# Patient Record
Sex: Female | Born: 1940
Health system: Southern US, Community
[De-identification: ages and names within clinical notes are randomized; demographics above are authoritative.]

## PROBLEM LIST (undated history)

## (undated) DIAGNOSIS — R112 Nausea with vomiting, unspecified: Secondary | ICD-10-CM

## (undated) DIAGNOSIS — M1990 Unspecified osteoarthritis, unspecified site: Secondary | ICD-10-CM

## (undated) DIAGNOSIS — J45901 Unspecified asthma with (acute) exacerbation: Secondary | ICD-10-CM

## (undated) DIAGNOSIS — M6740 Ganglion, unspecified site: Secondary | ICD-10-CM

## (undated) DIAGNOSIS — Z9889 Other specified postprocedural states: Secondary | ICD-10-CM

## (undated) DIAGNOSIS — N390 Urinary tract infection, site not specified: Secondary | ICD-10-CM

## (undated) DIAGNOSIS — M949 Disorder of cartilage, unspecified: Secondary | ICD-10-CM

## (undated) DIAGNOSIS — M899 Disorder of bone, unspecified: Secondary | ICD-10-CM

## (undated) DIAGNOSIS — I10 Essential (primary) hypertension: Secondary | ICD-10-CM

## (undated) DIAGNOSIS — K219 Gastro-esophageal reflux disease without esophagitis: Secondary | ICD-10-CM

## (undated) DIAGNOSIS — F419 Anxiety disorder, unspecified: Secondary | ICD-10-CM

## (undated) DIAGNOSIS — F329 Major depressive disorder, single episode, unspecified: Secondary | ICD-10-CM

## (undated) DIAGNOSIS — F32A Depression, unspecified: Secondary | ICD-10-CM

## (undated) DIAGNOSIS — J309 Allergic rhinitis, unspecified: Secondary | ICD-10-CM

## (undated) DIAGNOSIS — S8263XA Displaced fracture of lateral malleolus of unspecified fibula, initial encounter for closed fracture: Secondary | ICD-10-CM

## (undated) DIAGNOSIS — G4700 Insomnia, unspecified: Secondary | ICD-10-CM

## (undated) DIAGNOSIS — E039 Hypothyroidism, unspecified: Secondary | ICD-10-CM

## (undated) DIAGNOSIS — R0989 Other specified symptoms and signs involving the circulatory and respiratory systems: Secondary | ICD-10-CM

## (undated) DIAGNOSIS — S92309A Fracture of unspecified metatarsal bone(s), unspecified foot, initial encounter for closed fracture: Secondary | ICD-10-CM

## (undated) HISTORY — DX: Displaced fracture of lateral malleolus of unspecified fibula, initial encounter for closed fracture: S82.63XA

## (undated) HISTORY — PX: EYE SURGERY: SHX253

## (undated) HISTORY — DX: Disorder of cartilage, unspecified: M94.9

## (undated) HISTORY — PX: TUBAL LIGATION: SHX77

## (undated) HISTORY — PX: TOTAL HIP ARTHROPLASTY: SHX124

## (undated) HISTORY — DX: Unspecified asthma with (acute) exacerbation: J45.901

## (undated) HISTORY — DX: Unspecified osteoarthritis, unspecified site: M19.90

## (undated) HISTORY — DX: Insomnia, unspecified: G47.00

## (undated) HISTORY — DX: Ganglion, unspecified site: M67.40

## (undated) HISTORY — PX: JOINT REPLACEMENT: SHX530

## (undated) HISTORY — DX: Essential (primary) hypertension: I10

## (undated) HISTORY — DX: Other specified symptoms and signs involving the circulatory and respiratory systems: R09.89

## (undated) HISTORY — DX: Urinary tract infection, site not specified: N39.0

## (undated) HISTORY — DX: Fracture of unspecified metatarsal bone(s), unspecified foot, initial encounter for closed fracture: S92.309A

## (undated) HISTORY — DX: Gastro-esophageal reflux disease without esophagitis: K21.9

## (undated) HISTORY — DX: Disorder of bone, unspecified: M89.9

## (undated) HISTORY — DX: Allergic rhinitis, unspecified: J30.9

---

## 2000-07-09 ENCOUNTER — Other Ambulatory Visit: Admission: RE | Admit: 2000-07-09 | Discharge: 2000-07-09 | Payer: Self-pay | Admitting: Neurosurgery

## 2001-01-14 ENCOUNTER — Ambulatory Visit (HOSPITAL_COMMUNITY): Admission: RE | Admit: 2001-01-14 | Discharge: 2001-01-14 | Payer: Self-pay | Admitting: Internal Medicine

## 2001-01-14 ENCOUNTER — Encounter: Payer: Self-pay | Admitting: Internal Medicine

## 2001-01-19 ENCOUNTER — Encounter: Payer: Self-pay | Admitting: Internal Medicine

## 2001-01-19 ENCOUNTER — Ambulatory Visit (HOSPITAL_COMMUNITY): Admission: RE | Admit: 2001-01-19 | Discharge: 2001-01-19 | Payer: Self-pay | Admitting: Internal Medicine

## 2001-06-09 ENCOUNTER — Ambulatory Visit (HOSPITAL_COMMUNITY): Admission: RE | Admit: 2001-06-09 | Discharge: 2001-06-09 | Payer: Self-pay | Admitting: Internal Medicine

## 2001-06-09 ENCOUNTER — Encounter: Payer: Self-pay | Admitting: Internal Medicine

## 2002-01-05 ENCOUNTER — Other Ambulatory Visit: Admission: RE | Admit: 2002-01-05 | Discharge: 2002-01-05 | Payer: Self-pay | Admitting: Internal Medicine

## 2002-01-11 ENCOUNTER — Ambulatory Visit (HOSPITAL_COMMUNITY): Admission: RE | Admit: 2002-01-11 | Discharge: 2002-01-11 | Payer: Self-pay | Admitting: Internal Medicine

## 2002-01-11 ENCOUNTER — Encounter: Payer: Self-pay | Admitting: Internal Medicine

## 2002-04-27 ENCOUNTER — Encounter: Payer: Self-pay | Admitting: Internal Medicine

## 2002-04-27 ENCOUNTER — Encounter: Admission: RE | Admit: 2002-04-27 | Discharge: 2002-04-27 | Payer: Self-pay | Admitting: Internal Medicine

## 2002-06-28 ENCOUNTER — Other Ambulatory Visit: Admission: RE | Admit: 2002-06-28 | Discharge: 2002-06-28 | Payer: Self-pay | Admitting: Internal Medicine

## 2002-06-30 ENCOUNTER — Ambulatory Visit (HOSPITAL_COMMUNITY): Admission: RE | Admit: 2002-06-30 | Discharge: 2002-06-30 | Payer: Self-pay | Admitting: Internal Medicine

## 2002-06-30 ENCOUNTER — Encounter: Payer: Self-pay | Admitting: Internal Medicine

## 2002-08-25 ENCOUNTER — Encounter: Payer: Self-pay | Admitting: Orthopedic Surgery

## 2002-08-29 ENCOUNTER — Ambulatory Visit (HOSPITAL_COMMUNITY): Admission: RE | Admit: 2002-08-29 | Discharge: 2002-08-29 | Payer: Self-pay | Admitting: General Surgery

## 2002-08-29 ENCOUNTER — Encounter: Payer: Self-pay | Admitting: General Surgery

## 2002-08-31 ENCOUNTER — Encounter: Payer: Self-pay | Admitting: Orthopedic Surgery

## 2002-08-31 ENCOUNTER — Inpatient Hospital Stay (HOSPITAL_COMMUNITY): Admission: RE | Admit: 2002-08-31 | Discharge: 2002-09-03 | Payer: Self-pay | Admitting: Orthopedic Surgery

## 2002-09-01 ENCOUNTER — Encounter: Payer: Self-pay | Admitting: Orthopedic Surgery

## 2003-03-22 ENCOUNTER — Ambulatory Visit (HOSPITAL_BASED_OUTPATIENT_CLINIC_OR_DEPARTMENT_OTHER): Admission: RE | Admit: 2003-03-22 | Discharge: 2003-03-22 | Payer: Self-pay | Admitting: Orthopedic Surgery

## 2003-03-22 ENCOUNTER — Encounter (INDEPENDENT_AMBULATORY_CARE_PROVIDER_SITE_OTHER): Payer: Self-pay | Admitting: Specialist

## 2003-09-25 ENCOUNTER — Ambulatory Visit (HOSPITAL_COMMUNITY): Admission: RE | Admit: 2003-09-25 | Discharge: 2003-09-25 | Payer: Self-pay | Admitting: Internal Medicine

## 2003-12-11 ENCOUNTER — Other Ambulatory Visit: Admission: RE | Admit: 2003-12-11 | Discharge: 2003-12-11 | Payer: Self-pay | Admitting: *Deleted

## 2004-05-15 ENCOUNTER — Ambulatory Visit (HOSPITAL_BASED_OUTPATIENT_CLINIC_OR_DEPARTMENT_OTHER): Admission: RE | Admit: 2004-05-15 | Discharge: 2004-05-15 | Payer: Self-pay | Admitting: Orthopedic Surgery

## 2004-05-15 ENCOUNTER — Ambulatory Visit (HOSPITAL_COMMUNITY): Admission: RE | Admit: 2004-05-15 | Discharge: 2004-05-15 | Payer: Self-pay | Admitting: Orthopedic Surgery

## 2004-05-22 ENCOUNTER — Ambulatory Visit (HOSPITAL_BASED_OUTPATIENT_CLINIC_OR_DEPARTMENT_OTHER): Admission: RE | Admit: 2004-05-22 | Discharge: 2004-05-22 | Payer: Self-pay | Admitting: Orthopedic Surgery

## 2004-06-19 ENCOUNTER — Ambulatory Visit (HOSPITAL_COMMUNITY): Admission: RE | Admit: 2004-06-19 | Discharge: 2004-06-19 | Payer: Self-pay | Admitting: Sports Medicine

## 2004-09-26 ENCOUNTER — Ambulatory Visit: Payer: Self-pay | Admitting: Internal Medicine

## 2004-11-12 ENCOUNTER — Ambulatory Visit: Payer: Self-pay | Admitting: Internal Medicine

## 2004-12-06 ENCOUNTER — Ambulatory Visit: Payer: Self-pay | Admitting: Internal Medicine

## 2004-12-11 ENCOUNTER — Ambulatory Visit (HOSPITAL_COMMUNITY): Admission: RE | Admit: 2004-12-11 | Discharge: 2004-12-11 | Payer: Self-pay | Admitting: Internal Medicine

## 2004-12-23 ENCOUNTER — Ambulatory Visit: Payer: Self-pay | Admitting: Internal Medicine

## 2004-12-23 ENCOUNTER — Ambulatory Visit (HOSPITAL_COMMUNITY): Admission: RE | Admit: 2004-12-23 | Discharge: 2004-12-23 | Payer: Self-pay | Admitting: Internal Medicine

## 2004-12-26 ENCOUNTER — Encounter: Admission: RE | Admit: 2004-12-26 | Discharge: 2004-12-26 | Payer: Self-pay | Admitting: Internal Medicine

## 2004-12-26 ENCOUNTER — Other Ambulatory Visit: Admission: RE | Admit: 2004-12-26 | Discharge: 2004-12-26 | Payer: Self-pay | Admitting: Internal Medicine

## 2004-12-26 ENCOUNTER — Ambulatory Visit: Payer: Self-pay | Admitting: Internal Medicine

## 2005-03-11 ENCOUNTER — Ambulatory Visit: Payer: Self-pay | Admitting: Internal Medicine

## 2005-11-13 ENCOUNTER — Ambulatory Visit: Payer: Self-pay | Admitting: Internal Medicine

## 2005-12-22 ENCOUNTER — Ambulatory Visit: Payer: Self-pay | Admitting: Internal Medicine

## 2005-12-29 ENCOUNTER — Ambulatory Visit: Payer: Self-pay | Admitting: Internal Medicine

## 2005-12-29 ENCOUNTER — Encounter: Payer: Self-pay | Admitting: Internal Medicine

## 2005-12-29 ENCOUNTER — Other Ambulatory Visit: Admission: RE | Admit: 2005-12-29 | Discharge: 2005-12-29 | Payer: Self-pay | Admitting: Internal Medicine

## 2005-12-30 ENCOUNTER — Ambulatory Visit (HOSPITAL_COMMUNITY): Admission: RE | Admit: 2005-12-30 | Discharge: 2005-12-30 | Payer: Self-pay | Admitting: Internal Medicine

## 2006-01-05 ENCOUNTER — Encounter: Admission: RE | Admit: 2006-01-05 | Discharge: 2006-01-05 | Payer: Self-pay | Admitting: Internal Medicine

## 2006-01-20 ENCOUNTER — Ambulatory Visit: Payer: Self-pay | Admitting: *Deleted

## 2006-07-16 ENCOUNTER — Ambulatory Visit: Payer: Self-pay | Admitting: Internal Medicine

## 2006-08-14 ENCOUNTER — Ambulatory Visit: Payer: Self-pay | Admitting: Internal Medicine

## 2006-09-14 ENCOUNTER — Ambulatory Visit: Payer: Self-pay | Admitting: Internal Medicine

## 2006-10-13 ENCOUNTER — Ambulatory Visit: Payer: Self-pay | Admitting: Internal Medicine

## 2006-11-12 ENCOUNTER — Ambulatory Visit: Payer: Self-pay | Admitting: Internal Medicine

## 2006-12-18 ENCOUNTER — Ambulatory Visit: Payer: Self-pay | Admitting: Internal Medicine

## 2007-02-18 ENCOUNTER — Ambulatory Visit: Payer: Self-pay | Admitting: Internal Medicine

## 2007-02-18 LAB — CONVERTED CEMR LAB: CRP, High Sensitivity: 117 — ABNORMAL HIGH (ref 0.00–5.00)

## 2007-02-19 ENCOUNTER — Encounter: Payer: Self-pay | Admitting: Internal Medicine

## 2007-02-22 ENCOUNTER — Ambulatory Visit: Payer: Self-pay | Admitting: Internal Medicine

## 2007-02-22 LAB — CONVERTED CEMR LAB
BUN: 23 mg/dL (ref 6–23)
Calcium: 8.8 mg/dL (ref 8.4–10.5)
Creatinine, Ser: 1 mg/dL (ref 0.4–1.2)
GFR calc Af Amer: 71 mL/min
Glucose, Bld: 79 mg/dL (ref 70–99)
Sodium: 140 meq/L (ref 135–145)

## 2007-03-01 ENCOUNTER — Encounter: Payer: Self-pay | Admitting: Internal Medicine

## 2007-03-01 ENCOUNTER — Ambulatory Visit: Payer: Self-pay

## 2007-04-07 DIAGNOSIS — M1990 Unspecified osteoarthritis, unspecified site: Secondary | ICD-10-CM

## 2007-04-07 DIAGNOSIS — I10 Essential (primary) hypertension: Secondary | ICD-10-CM

## 2007-04-07 HISTORY — DX: Unspecified osteoarthritis, unspecified site: M19.90

## 2007-04-07 HISTORY — DX: Essential (primary) hypertension: I10

## 2007-04-19 ENCOUNTER — Ambulatory Visit: Payer: Self-pay | Admitting: Internal Medicine

## 2007-04-19 DIAGNOSIS — M6740 Ganglion, unspecified site: Secondary | ICD-10-CM | POA: Insufficient documentation

## 2007-04-19 DIAGNOSIS — E785 Hyperlipidemia, unspecified: Secondary | ICD-10-CM

## 2007-04-19 DIAGNOSIS — R0989 Other specified symptoms and signs involving the circulatory and respiratory systems: Secondary | ICD-10-CM | POA: Insufficient documentation

## 2007-04-19 HISTORY — DX: Other specified symptoms and signs involving the circulatory and respiratory systems: R09.89

## 2007-04-19 HISTORY — DX: Ganglion, unspecified site: M67.40

## 2007-04-22 ENCOUNTER — Encounter: Payer: Self-pay | Admitting: Internal Medicine

## 2007-04-22 ENCOUNTER — Ambulatory Visit: Payer: Self-pay

## 2007-07-14 ENCOUNTER — Ambulatory Visit: Payer: Self-pay | Admitting: Internal Medicine

## 2007-07-14 LAB — CONVERTED CEMR LAB
ALT: 33 units/L (ref 0–35)
Albumin: 3.6 g/dL (ref 3.5–5.2)
Alkaline Phosphatase: 80 units/L (ref 39–117)
BUN: 15 mg/dL (ref 6–23)
Basophils Absolute: 0 10*3/uL (ref 0.0–0.1)
Cholesterol: 193 mg/dL (ref 0–200)
Creatinine, Ser: 0.8 mg/dL (ref 0.4–1.2)
Eosinophils Absolute: 0.1 10*3/uL (ref 0.0–0.6)
Eosinophils Relative: 1.9 % (ref 0.0–5.0)
HDL: 69.9 mg/dL (ref >39.0)
Hemoglobin: 14.8 g/dL (ref 12.0–15.0)
Ketones, urine, test strip: NEGATIVE
Lymphocytes Relative: 42.1 % (ref 12.0–46.0)
MCHC: 34.7 g/dL (ref 30.0–36.0)
MCV: 88.8 fL (ref 78.0–100.0)
Monocytes Relative: 7.5 % (ref 3.0–11.0)
Neutro Abs: 3.6 10*3/uL (ref 1.4–7.7)
Platelets: 339 10*3/uL (ref 150–400)
Potassium: 3.8 meq/L (ref 3.5–5.1)
RBC: 4.8 M/uL (ref 3.87–5.11)
Specific Gravity, Urine: 1.015
Total Bilirubin: 0.6 mg/dL (ref 0.3–1.2)
Total Protein: 6.1 g/dL (ref 6.0–8.3)
WBC: 7.3 10*3/uL (ref 4.5–10.5)
pH: 7.5

## 2007-07-20 ENCOUNTER — Other Ambulatory Visit: Admission: RE | Admit: 2007-07-20 | Discharge: 2007-07-20 | Payer: Self-pay | Admitting: Internal Medicine

## 2007-07-20 ENCOUNTER — Ambulatory Visit: Payer: Self-pay | Admitting: Internal Medicine

## 2007-07-22 ENCOUNTER — Encounter: Payer: Self-pay | Admitting: Internal Medicine

## 2007-07-26 ENCOUNTER — Telehealth: Payer: Self-pay | Admitting: *Deleted

## 2007-08-17 ENCOUNTER — Telehealth: Payer: Self-pay | Admitting: Internal Medicine

## 2007-09-13 ENCOUNTER — Telehealth: Payer: Self-pay | Admitting: Internal Medicine

## 2007-09-15 LAB — CONVERTED CEMR LAB: Pap Smear: NORMAL

## 2007-10-26 ENCOUNTER — Ambulatory Visit: Payer: Self-pay | Admitting: Internal Medicine

## 2008-02-17 ENCOUNTER — Ambulatory Visit: Payer: Self-pay | Admitting: Internal Medicine

## 2008-02-17 DIAGNOSIS — J45901 Unspecified asthma with (acute) exacerbation: Secondary | ICD-10-CM

## 2008-02-17 HISTORY — DX: Unspecified asthma with (acute) exacerbation: J45.901

## 2008-03-07 ENCOUNTER — Ambulatory Visit: Payer: Self-pay | Admitting: Internal Medicine

## 2008-03-07 ENCOUNTER — Encounter: Payer: Self-pay | Admitting: Internal Medicine

## 2008-03-10 ENCOUNTER — Ambulatory Visit: Payer: Self-pay | Admitting: Internal Medicine

## 2008-03-10 DIAGNOSIS — M899 Disorder of bone, unspecified: Secondary | ICD-10-CM

## 2008-03-10 DIAGNOSIS — M949 Disorder of cartilage, unspecified: Secondary | ICD-10-CM

## 2008-03-10 HISTORY — DX: Disorder of bone, unspecified: M89.9

## 2008-03-10 LAB — CONVERTED CEMR LAB: Vit D, 1,25-Dihydroxy: 35 (ref 30–89)

## 2008-03-23 ENCOUNTER — Telehealth: Payer: Self-pay | Admitting: Internal Medicine

## 2008-06-09 ENCOUNTER — Ambulatory Visit: Payer: Self-pay | Admitting: Internal Medicine

## 2008-07-14 ENCOUNTER — Telehealth: Payer: Self-pay | Admitting: Internal Medicine

## 2008-08-21 ENCOUNTER — Ambulatory Visit: Payer: Self-pay | Admitting: Internal Medicine

## 2008-08-28 ENCOUNTER — Ambulatory Visit: Payer: Self-pay | Admitting: Internal Medicine

## 2008-08-30 ENCOUNTER — Inpatient Hospital Stay (HOSPITAL_COMMUNITY): Admission: EM | Admit: 2008-08-30 | Discharge: 2008-09-05 | Payer: Self-pay | Admitting: Emergency Medicine

## 2008-08-30 ENCOUNTER — Telehealth: Payer: Self-pay | Admitting: *Deleted

## 2008-09-20 ENCOUNTER — Ambulatory Visit: Payer: Self-pay | Admitting: Internal Medicine

## 2008-09-20 LAB — CONVERTED CEMR LAB
Basophils Absolute: 0 10*3/uL (ref 0.0–0.1)
CO2: 33 meq/L — ABNORMAL HIGH (ref 19–32)
Chloride: 98 meq/L (ref 96–112)
Creatinine, Ser: 1 mg/dL (ref 0.4–1.2)
Lymphocytes Relative: 40 % (ref 12.0–46.0)
MCHC: 34.6 g/dL (ref 30.0–36.0)
Monocytes Absolute: 0.8 10*3/uL (ref 0.1–1.0)
Neutro Abs: 4.6 10*3/uL (ref 1.4–7.7)
Platelets: 483 10*3/uL — ABNORMAL HIGH (ref 150–400)
Potassium: 4.1 meq/L (ref 3.5–5.1)
RBC: 4.89 M/uL (ref 3.87–5.11)
RDW: 12.7 % (ref 11.5–14.6)

## 2008-09-21 ENCOUNTER — Encounter: Payer: Self-pay | Admitting: Internal Medicine

## 2008-09-22 ENCOUNTER — Ambulatory Visit (HOSPITAL_COMMUNITY): Admission: RE | Admit: 2008-09-22 | Discharge: 2008-09-22 | Payer: Self-pay | Admitting: Internal Medicine

## 2008-09-22 ENCOUNTER — Telehealth: Payer: Self-pay | Admitting: Internal Medicine

## 2008-09-25 ENCOUNTER — Telehealth: Payer: Self-pay | Admitting: Internal Medicine

## 2008-09-29 ENCOUNTER — Telehealth: Payer: Self-pay | Admitting: Internal Medicine

## 2008-11-08 ENCOUNTER — Ambulatory Visit: Payer: Self-pay | Admitting: Internal Medicine

## 2008-11-08 DIAGNOSIS — K219 Gastro-esophageal reflux disease without esophagitis: Secondary | ICD-10-CM

## 2008-11-08 HISTORY — DX: Gastro-esophageal reflux disease without esophagitis: K21.9

## 2008-11-21 ENCOUNTER — Telehealth: Payer: Self-pay | Admitting: Internal Medicine

## 2008-11-30 ENCOUNTER — Ambulatory Visit: Payer: Self-pay | Admitting: Critical Care Medicine

## 2008-11-30 DIAGNOSIS — J309 Allergic rhinitis, unspecified: Secondary | ICD-10-CM

## 2008-11-30 HISTORY — DX: Allergic rhinitis, unspecified: J30.9

## 2008-12-13 ENCOUNTER — Ambulatory Visit: Payer: Self-pay | Admitting: Critical Care Medicine

## 2008-12-14 ENCOUNTER — Encounter: Payer: Self-pay | Admitting: Critical Care Medicine

## 2009-01-04 ENCOUNTER — Ambulatory Visit: Payer: Self-pay | Admitting: Critical Care Medicine

## 2009-03-14 ENCOUNTER — Encounter: Payer: Self-pay | Admitting: Internal Medicine

## 2009-03-14 ENCOUNTER — Ambulatory Visit: Payer: Self-pay | Admitting: Internal Medicine

## 2009-03-16 ENCOUNTER — Encounter: Payer: Self-pay | Admitting: Critical Care Medicine

## 2009-04-13 ENCOUNTER — Ambulatory Visit: Payer: Self-pay | Admitting: Critical Care Medicine

## 2009-05-22 ENCOUNTER — Encounter: Payer: Self-pay | Admitting: Orthopedic Surgery

## 2009-05-24 ENCOUNTER — Ambulatory Visit: Payer: Self-pay | Admitting: Internal Medicine

## 2009-05-24 LAB — CONVERTED CEMR LAB
AST: 28 units/L (ref 0–37)
Albumin: 3.6 g/dL (ref 3.5–5.2)
BUN: 22 mg/dL (ref 6–23)
Basophils Relative: 0.1 % (ref 0.0–3.0)
CO2: 31 meq/L (ref 19–32)
Chloride: 102 meq/L (ref 96–112)
Eosinophils Relative: 1.9 % (ref 0.0–5.0)
Glucose, Bld: 90 mg/dL (ref 70–99)
Hemoglobin: 14.4 g/dL (ref 12.0–15.0)
LDL Cholesterol: 100 mg/dL — ABNORMAL HIGH (ref 0–99)
Lymphocytes Relative: 37.4 % (ref 12.0–46.0)
MCV: 89 fL (ref 78.0–100.0)
Monocytes Relative: 8.1 % (ref 3.0–12.0)
Neutro Abs: 4.2 10*3/uL (ref 1.4–7.7)
Neutrophils Relative %: 52.5 % (ref 43.0–77.0)
Nitrite: NEGATIVE
Protein, U semiquant: NEGATIVE
Sodium: 140 meq/L (ref 135–145)
TSH: 0.21 microintl units/mL — ABNORMAL LOW (ref 0.35–5.50)
Total Bilirubin: 0.7 mg/dL (ref 0.3–1.2)
Total CHOL/HDL Ratio: 3

## 2009-05-29 ENCOUNTER — Emergency Department (HOSPITAL_COMMUNITY): Admission: EM | Admit: 2009-05-29 | Discharge: 2009-05-29 | Payer: Self-pay | Admitting: Emergency Medicine

## 2009-06-01 ENCOUNTER — Telehealth: Payer: Self-pay | Admitting: Internal Medicine

## 2009-06-04 ENCOUNTER — Ambulatory Visit: Payer: Self-pay | Admitting: Orthopedic Surgery

## 2009-06-04 DIAGNOSIS — S8263XA Displaced fracture of lateral malleolus of unspecified fibula, initial encounter for closed fracture: Secondary | ICD-10-CM

## 2009-06-04 DIAGNOSIS — S92309A Fracture of unspecified metatarsal bone(s), unspecified foot, initial encounter for closed fracture: Secondary | ICD-10-CM

## 2009-06-04 HISTORY — DX: Displaced fracture of lateral malleolus of unspecified fibula, initial encounter for closed fracture: S82.63XA

## 2009-06-04 HISTORY — DX: Fracture of unspecified metatarsal bone(s), unspecified foot, initial encounter for closed fracture: S92.309A

## 2009-06-07 ENCOUNTER — Ambulatory Visit: Payer: Self-pay | Admitting: Orthopedic Surgery

## 2009-06-11 ENCOUNTER — Ambulatory Visit: Payer: Self-pay | Admitting: Orthopedic Surgery

## 2009-06-18 ENCOUNTER — Telehealth: Payer: Self-pay | Admitting: Orthopedic Surgery

## 2009-06-19 ENCOUNTER — Ambulatory Visit: Payer: Self-pay | Admitting: Orthopedic Surgery

## 2009-07-04 ENCOUNTER — Telehealth: Payer: Self-pay | Admitting: Internal Medicine

## 2009-07-10 ENCOUNTER — Ambulatory Visit: Payer: Self-pay | Admitting: Orthopedic Surgery

## 2009-07-19 ENCOUNTER — Telehealth: Payer: Self-pay | Admitting: Internal Medicine

## 2009-07-20 ENCOUNTER — Ambulatory Visit: Payer: Self-pay | Admitting: Internal Medicine

## 2009-07-20 DIAGNOSIS — N390 Urinary tract infection, site not specified: Secondary | ICD-10-CM | POA: Insufficient documentation

## 2009-07-20 HISTORY — DX: Urinary tract infection, site not specified: N39.0

## 2009-07-21 ENCOUNTER — Encounter: Payer: Self-pay | Admitting: Internal Medicine

## 2009-07-24 ENCOUNTER — Telehealth: Payer: Self-pay | Admitting: Internal Medicine

## 2009-07-31 ENCOUNTER — Ambulatory Visit: Payer: Self-pay | Admitting: Orthopedic Surgery

## 2009-08-14 ENCOUNTER — Ambulatory Visit: Payer: Self-pay | Admitting: Internal Medicine

## 2009-08-15 ENCOUNTER — Encounter: Payer: Self-pay | Admitting: Internal Medicine

## 2009-08-17 ENCOUNTER — Telehealth: Payer: Self-pay | Admitting: Internal Medicine

## 2009-08-31 ENCOUNTER — Telehealth (INDEPENDENT_AMBULATORY_CARE_PROVIDER_SITE_OTHER): Payer: Self-pay | Admitting: *Deleted

## 2009-09-03 ENCOUNTER — Ambulatory Visit: Payer: Self-pay | Admitting: Critical Care Medicine

## 2009-09-04 DIAGNOSIS — J069 Acute upper respiratory infection, unspecified: Secondary | ICD-10-CM | POA: Insufficient documentation

## 2009-10-18 ENCOUNTER — Ambulatory Visit: Payer: Self-pay | Admitting: Critical Care Medicine

## 2009-11-08 ENCOUNTER — Ambulatory Visit (HOSPITAL_COMMUNITY): Admission: RE | Admit: 2009-11-08 | Discharge: 2009-11-08 | Payer: Self-pay | Admitting: Internal Medicine

## 2009-11-09 ENCOUNTER — Ambulatory Visit: Payer: Self-pay | Admitting: Internal Medicine

## 2009-11-28 ENCOUNTER — Ambulatory Visit: Payer: Self-pay | Admitting: Critical Care Medicine

## 2009-11-28 ENCOUNTER — Encounter: Payer: Self-pay | Admitting: Internal Medicine

## 2010-02-04 ENCOUNTER — Ambulatory Visit: Payer: Self-pay | Admitting: Internal Medicine

## 2010-02-07 ENCOUNTER — Telehealth: Payer: Self-pay | Admitting: Internal Medicine

## 2010-05-13 ENCOUNTER — Telehealth: Payer: Self-pay | Admitting: Internal Medicine

## 2010-08-07 ENCOUNTER — Ambulatory Visit: Payer: Self-pay | Admitting: Internal Medicine

## 2010-08-07 DIAGNOSIS — G4700 Insomnia, unspecified: Secondary | ICD-10-CM | POA: Insufficient documentation

## 2010-08-07 HISTORY — DX: Insomnia, unspecified: G47.00

## 2010-08-07 LAB — CONVERTED CEMR LAB
Glucose, Urine, Semiquant: NEGATIVE
Ketones, urine, test strip: NEGATIVE
Specific Gravity, Urine: 1.02
pH: 6.5

## 2010-08-22 ENCOUNTER — Ambulatory Visit: Payer: Self-pay | Admitting: Pulmonary Disease

## 2010-09-17 ENCOUNTER — Telehealth: Payer: Self-pay | Admitting: Internal Medicine

## 2010-10-06 ENCOUNTER — Encounter: Payer: Self-pay | Admitting: Internal Medicine

## 2010-10-14 ENCOUNTER — Telehealth (INDEPENDENT_AMBULATORY_CARE_PROVIDER_SITE_OTHER): Payer: Self-pay | Admitting: *Deleted

## 2010-10-15 ENCOUNTER — Encounter: Payer: Self-pay | Admitting: Internal Medicine

## 2010-10-15 NOTE — Assessment & Plan Note (Signed)
Summary: fu per pt/njr   Vital Signs:  Patient profile:   70 year old female Height:      64.5 inches Weight:      181 pounds BMI:     30.70 Temp:     98.2 degrees F oral Pulse rate:   80 / minute Resp:     14 per minute BP sitting:   130 / 82  (left arm)  Vitals Entered By: Willy Eddy, LPN (November 09, 2009 12:04 PM) CC: roa- h as rt torn rotator cuff- seeing dr Thurston Hole   Primary Care Provider:  Dr Darryll Capers  CC:  roa- h as rt torn rotator cuff- seeing dr Thurston Hole.  History of Present Illness: the pt has rotator cuff with no surgical option the pts blood [pressure is in good control the pt has loss of strength in the arm the asthma is in good control and the GERD has stopped with the weight loss  Preventive Screening-Counseling & Management  Alcohol-Tobacco     Smoking Status: quit     Packs/Day: 1.5     Year Started: 1950     Year Quit: 1970     Pack years: 30  Problems Prior to Update: 1)  Uri  (ICD-465.9) 2)  Uti  (ICD-599.0) 3)  Closed Fracture of Lateral Malleolus  (ICD-824.2) 4)  Closed Fracture of Metatarsal Bone  (ICD-825.25) 5)  Allergic Rhinitis  (ICD-477.9) 6)  Gerd  (ICD-530.81) 7)  Esophageal Reflux  (ICD-530.81) 8)  Cough  (ICD-786.2) 9)  Osteopenia  (ICD-733.90) 10)  Asthma Unspecified With Exacerbation  (ICD-493.92) 11)  Preventive Health Care  (ICD-V70.0) 12)  Ganglion Cyst  (ICD-727.43) 13)  Carotid Bruit, Left  (ICD-785.9) 14)  Hyperlipidemia Nec/nos  (ICD-272.4) 15)  Osteoarthritis  (ICD-715.90) 16)  Hypertension  (ICD-401.9)  Current Problems (verified): 1)  Uri  (ICD-465.9) 2)  Uti  (ICD-599.0) 3)  Closed Fracture of Lateral Malleolus  (ICD-824.2) 4)  Closed Fracture of Metatarsal Bone  (ICD-825.25) 5)  Allergic Rhinitis  (ICD-477.9) 6)  Gerd  (ICD-530.81) 7)  Esophageal Reflux  (ICD-530.81) 8)  Cough  (ICD-786.2) 9)  Osteopenia  (ICD-733.90) 10)  Asthma Unspecified With Exacerbation  (ICD-493.92) 11)  Preventive Health  Care  (ICD-V70.0) 12)  Ganglion Cyst  (ICD-727.43) 13)  Carotid Bruit, Left  (ICD-785.9) 14)  Hyperlipidemia Nec/nos  (ICD-272.4) 15)  Osteoarthritis  (ICD-715.90) 16)  Hypertension  (ICD-401.9)  Medications Prior to Update: 1)  Wellbutrin Xl 300 Mg Tb24 (Bupropion Hcl) .... Take One Tablet By Mouth Daily 2)  Multivitamins   Tabs (Multiple Vitamin) .... Take One  Tablet By Mouth Daily 3)  Diovan Hct 320-25 Mg Tabs (Valsartan-Hydrochlorothiazide) .... One By Mouth Daily 4)  Prozac 20 Mg  Caps (Fluoxetine Hcl) .... One By Mouth Daiyl 5)  Adderall 20 Mg Tabs (Amphetamine-Dextroamphetamine) .... One By Mouth Bid 6)  Nasonex 50 Mcg/act  Susp (Mometasone Furoate) .... Two Puffs Each Nostril Daily As Needed 7)  Aleve 220 Mg Tabs (Naproxen Sodium) .... As Needed\par 8)  Excederin For [employer] ?dose .... As Needed 9)  Tussionex Pennkinetic Er 8-10 Mg/27ml Lqcr (Chlorpheniramine-Hydrocodone) .Marland Kitchen.. 1 Tsp Every 12 Hr As Needed Cough 10)  Symbicort 80-4.5 Mcg/act Aero (Budesonide-Formoterol Fumarate) .... 2 Puffs Two Times A Day 11)  Avelox 400 Mg Tabs (Moxifloxacin Hcl) .Marland Kitchen.. 1 By Mouth Once Daily 12)  Tussionex Pennkinetic Er 8-10 Mg/18ml Lqcr (Chlorpheniramine-Hydrocodone) .Marland Kitchen.. 1 Tsp Every 12 Hrs As Needed Cough  Current Medications (verified): 1)  Wellbutrin Xl  300 Mg Tb24 (Bupropion Hcl) .... Take One Tablet By Mouth Daily 2)  Multivitamins   Tabs (Multiple Vitamin) .... Take One  Tablet By Mouth Daily 3)  Diovan Hct 320-25 Mg Tabs (Valsartan-Hydrochlorothiazide) .... One By Mouth Daily 4)  Prozac 20 Mg  Caps (Fluoxetine Hcl) .... One By Mouth Daiyl 5)  Adderall 20 Mg Tabs (Amphetamine-Dextroamphetamine) .... One By Mouth Bid 6)  Nasonex 50 Mcg/act  Susp (Mometasone Furoate) .... Two Puffs Each Nostril Daily As Needed 7)  Aleve 220 Mg Tabs (Naproxen Sodium) .... As Needed\par 8)  Excederin For [employer] ?dose .... As Needed  Allergies (verified): No Known Drug Allergies  Past  History:  Family History: Last updated: 11/30/2008 Family History High cholesterol mother with PAD and carotid blockage brother-small cell carcinoma father-emphysema, asthma grandmother-asthma grandfather-CA ? type  Social History: Last updated: 11/30/2008 Retired.  Formerly worked [employer].   Married with children. Former Smoker.  Quit smoking 1970.  Smoked 1 1/2ppd x 20 years.   Risk Factors: Smoking Status: quit (11/09/2009) Packs/Day: 1.5 (11/09/2009)  Past medical, surgical, family and social histories (including risk factors) reviewed, and no changes noted (except as noted below).  Past Medical History: Reviewed history from 11/30/2008 and no changes required. Hypertension Osteoarthritis DJD Current Problems:  ESOPHAGEAL REFLUX (ICD-530.81) COUGH (ICD-786.2) SEPTICEMIA DUE TO ESCHERICHIA COLI (ICD-038.42) OSTEOPENIA (ICD-733.90) ASTHMA UNSPECIFIED WITH EXACERBATION (ICD-493.92) FLU SYNDROME (ICD-487.1) PREVENTIVE HEALTH CARE (ICD-V70.0) GANGLION CYST (ICD-727.43) CAROTID BRUIT, LEFT (ICD-785.9) HYPERLIPIDEMIA NEC/NOS (ICD-272.4) OSTEOARTHRITIS (ICD-715.90) HYPERTENSION (ICD-401.9)    Past Surgical History: Reviewed history from 04/19/2007 and no changes required. Tubal ligation Total hip replacement left 2005 Knuckle replacement  Family History: Reviewed history from 11/30/2008 and no changes required. Family History High cholesterol mother with PAD and carotid blockage brother-small cell carcinoma father-emphysema, asthma grandmother-asthma grandfather-CA ? type  Social History: Reviewed history from 11/30/2008 and no changes required. Retired.  Formerly worked [employer].   Married with children. Former Smoker.  Quit smoking 1970.  Smoked 1 1/2ppd x 20 years.   Review of Systems  The patient denies anorexia, fever, weight loss, weight gain, vision loss, decreased hearing, hoarseness, chest pain, syncope, dyspnea on  exertion, peripheral edema, prolonged cough, headaches, hemoptysis, abdominal pain, melena, hematochezia, severe indigestion/heartburn, hematuria, incontinence, genital sores, muscle weakness, suspicious skin lesions, transient blindness, difficulty walking, depression, unusual weight change, abnormal bleeding, enlarged lymph nodes, angioedema, and breast masses.    Physical Exam  General:  alert and well-developed.   Head:  Normocephalic and atraumatic without obvious abnormalities. No apparent alopecia or balding. Eyes:  No corneal or conjunctival inflammation noted. EOMI. Perrla. Funduscopic exam benign, without hemorrhages, exudates or papilledema. Vision grossly normal. Ears:  R ear normal and L ear normal.   Nose:  no external deformity and no nasal discharge.   Mouth:  good dentition and pharynx pink and moist.   Neck:  supple and full ROM.   Lungs:  Normal respiratory effort, chest expands symmetrically. Lungs are clear to auscultation, no crackles or wheezes. Heart:  Normal rate and regular rhythm. S1 and S2 normal without gallop, murmur, click, rub or other extra sounds.   Impression & Recommendations:  Problem # 1:  URI (ICD-465.9) took the anitbiotic but did not use the symbicort she did not feel she needed the pt was treated in pulmonary and she responde The following medications were removed from the medication list:    Tussionex Pennkinetic Er 8-10 Mg/73ml Lqcr (Chlorpheniramine-hydrocodone) .Marland Kitchen... 1 tsp every 12 hr as  needed cough    Tussionex Pennkinetic Er 8-10 Mg/45ml Lqcr (Chlorpheniramine-hydrocodone) .Marland Kitchen... 1 tsp every 12 hrs as needed cough Her updated medication list for this problem includes:    Aleve 220 Mg Tabs (Naproxen sodium) .Marland Kitchen... As needed\  Instructed on symptomatic treatment. Call if symptoms persist or worsen.   Problem # 2:  ESOPHAGEAL REFLUX (ICD-530.81) resolved with the 14 lbs weight loss Labs Reviewed: Hgb: 14.4 (05/24/2009)   Hct: 42.6  (05/24/2009)  Problem # 3:  PREVENTIVE HEALTH CARE (ICD-V70.0) has lost 14 pounds  Problem # 4:  HYPERLIPIDEMIA NEC/NOS (ICD-272.4)  Labs Reviewed: SGOT: 28 (05/24/2009)   SGPT: 26 (05/24/2009)  Prior 10 Yr Risk Heart Disease: 9 % (11/08/2008)   HDL:54.70 (05/24/2009), 69.9 (07/14/2007)  LDL:100 (05/24/2009), 117 (07/14/2007)  Chol:169 (05/24/2009), 193 (07/14/2007)  Trig:73.0 (05/24/2009), 30 (07/14/2007)  Complete Medication List: 1)  Wellbutrin Xl 300 Mg Tb24 (Bupropion hcl) .... Take one tablet by mouth daily 2)  Multivitamins Tabs (Multiple vitamin) .... Take one  tablet by mouth daily 3)  Diovan Hct 320-25 Mg Tabs (Valsartan-hydrochlorothiazide) .... One by mouth daily 4)  Prozac 20 Mg Caps (Fluoxetine hcl) .... One by mouth daiyl 5)  Adderall 20 Mg Tabs (Amphetamine-dextroamphetamine) .... One by mouth bid 6)  Nasonex 50 Mcg/act Susp (Mometasone furoate) .... Two puffs each nostril daily as needed 7)  Aleve 220 Mg Tabs (Naproxen sodium) .... As needed\par 8)  Excederin For [employer] ?dose  .... As needed  Patient Instructions: 1)  Please schedule a follow-up appointment in 3 months.  pap Prescriptions: ADDERALL 20 MG TABS (AMPHETAMINE-DEXTROAMPHETAMINE) one by mouth BID  #60 x 0   Entered by:   Willy Eddy, LPN   Authorized by:   Stacie Glaze MD   Signed by:   Willy Eddy, LPN on 81/19/1478   Method used:   Print then Give to Patient   RxID:   [id number] ADDERALL 20 MG TABS (AMPHETAMINE-DEXTROAMPHETAMINE) one by mouth BID  #60 x 0   Entered by:   Willy Eddy, LPN   Authorized by:   Stacie Glaze MD   Signed by:   Willy Eddy, LPN on 62/95/2841   Method used:   Print then Give to Patient   RxID:   [id number] ADDERALL 20 MG TABS (AMPHETAMINE-DEXTROAMPHETAMINE) one by mouth BID  #60 x 0   Entered by:   Willy Eddy, LPN   Authorized by:   Stacie Glaze MD   Signed by:   Willy Eddy, LPN on 03/47/4259   Method used:    Print then Give to Patient   RxID:   [id number]

## 2010-10-15 NOTE — Assessment & Plan Note (Signed)
Summary: Pulmonary OV   Copy to:  ER Primary Provider/Referring Provider:  Dr Darryll Capers  CC:  Pt here for follow-up.  She says she feels like new..  History of Present Illness:  70 year old female  with cyclic cough, GERD doubt true asthma   November 28, 2009 12:04 PM  The pt notes the cough is now gone The pt saw np 10/18/09: rx was : Restart Symbicort 80/4.79mcg 2 pufs two times a day , brush, rinse and gargle after using ,then drink cup of water.  Avelox 400mg  once daily for 7 days Mucinex DM two times a day as needed cough /congestion  Increase fluids.  Tussionex 1 tsp every 12 hr as needed cough  The pt did not fill tussionex, had some left over. The pt did not resume symbicort and got better without the symbicort The pt did fill and take avelox and mucinex  now:  no cough now, not dyspneic. She is back to baseline.  No wheeze     Preventive Screening-Counseling & Management  Alcohol-Tobacco     Smoking Status: never  Current Medications (verified): 1)  Wellbutrin Xl 300 Mg Tb24 (Bupropion Hcl) .... Take One Tablet By Mouth Daily 2)  Multivitamins   Tabs (Multiple Vitamin) .... Take One  Tablet By Mouth Daily 3)  Diovan Hct 320-25 Mg Tabs (Valsartan-Hydrochlorothiazide) .... One By Mouth Daily 4)  Prozac 20 Mg  Caps (Fluoxetine Hcl) .... One By Mouth Daiyl 5)  Adderall 20 Mg Tabs (Amphetamine-Dextroamphetamine) .... One By Mouth Bid 6)  Nasonex 50 Mcg/act  Susp (Mometasone Furoate) .... Two Puffs Each Nostril Daily As Needed 7)  Aleve 220 Mg Tabs (Naproxen Sodium) .... As Needed\par 8)  Excederin For [employer] ?dose .... As Needed  Allergies (verified): No Known Drug Allergies  Past History:  Past medical, surgical, family and social histories (including risk factors) reviewed, and no changes noted (except as noted below).  Past Medical History: Reviewed history from 11/30/2008 and no changes required. Hypertension Osteoarthritis DJD Current Problems:    ESOPHAGEAL REFLUX (ICD-530.81) COUGH (ICD-786.2) SEPTICEMIA DUE TO ESCHERICHIA COLI (ICD-038.42) OSTEOPENIA (ICD-733.90) ASTHMA UNSPECIFIED WITH EXACERBATION (ICD-493.92) FLU SYNDROME (ICD-487.1) PREVENTIVE HEALTH CARE (ICD-V70.0) GANGLION CYST (ICD-727.43) CAROTID BRUIT, LEFT (ICD-785.9) HYPERLIPIDEMIA NEC/NOS (ICD-272.4) OSTEOARTHRITIS (ICD-715.90) HYPERTENSION (ICD-401.9)    Past Surgical History: Reviewed history from 04/19/2007 and no changes required. Tubal ligation Total hip replacement left 2005 Knuckle replacement  Family History: Reviewed history from 11/30/2008 and no changes required. Family History High cholesterol mother with PAD and carotid blockage brother-small cell carcinoma father-emphysema, asthma grandmother-asthma grandfather-CA ? type  Social History: Reviewed history from 11/30/2008 and no changes required. Retired.  Formerly worked [employer].   Married with children. Former Smoker.  Quit smoking 1970.  Smoked 1 1/2ppd x 20 years.  Smoking Status:  never  Review of Systems  The patient denies shortness of breath with activity, shortness of breath at rest, productive cough, non-productive cough, coughing up blood, chest pain, irregular heartbeats, acid heartburn, indigestion, loss of appetite, weight change, abdominal pain, difficulty swallowing, sore throat, tooth/dental problems, headaches, nasal congestion/difficulty breathing through nose, sneezing, itching, ear ache, anxiety, depression, hand/feet swelling, joint stiffness or pain, rash, change in color of mucus, and fever.    Vital Signs:  Patient profile:   70 year old female Height:      64.5 inches (163.83 cm) Weight:      178 pounds (80.91 kg) BMI:     30.19 O2 Sat:  95 % on Room air Temp:     97.7 degrees F (36.50 degrees C) oral Pulse rate:   85 / minute BP sitting:   118 / 74  (left arm) Cuff size:   regular  Vitals Entered By: Michel Bickers CMA (November 28, 2009  12:03 PM)  O2 Sat at Rest %:  95 O2 Flow:  Room air  Physical Exam  Additional Exam:  Gen: Pleasant, well-nourished, in no distress,  normal affect ENT: No lesions,  mouth clear,  oropharynx clear drainage.  no postnasal drip Neck: No JVD, no TMG, no carotid bruits Lungs:clear, no wheeze  Cardiovascular: RRR, heart sounds normal, no murmur or gallops, no peripheral edema Abdomen: soft and NT, no HSM,  BS normal Musculoskeletal: No deformities, no cyanosis or clubbing Neuro: alert, non focal Skin: Warm, no lesions or rashes    Pulmonary Function Test Date: 11/28/2009 12:16 AM Height (in.): 64 Gender: Female  Pre-Spirometry FVC    Value: 2.98 L/min   % Pred: 99.30 % FEV1    Value: 2.32 L     Pred: 2.28 L     % Pred: 101.80 % FEV1/FVC  Value: 77.75 %     % Pred: 102.10 %  Impression & Recommendations:  Problem # 1:  COUGH (ICD-786.2) Assessment Improved cyclical cough now improved,  doubt true asthma,  normal spirometry today,  allergic rhinitis and GERD were ppt factors plan d/c symbicort Return as needed  Orders: Est. Patient Level III (04540)  Complete Medication List: 1)  Wellbutrin Xl 300 Mg Tb24 (Bupropion hcl) .... Take one tablet by mouth daily 2)  Multivitamins Tabs (Multiple vitamin) .... Take one  tablet by mouth daily 3)  Diovan Hct 320-25 Mg Tabs (Valsartan-hydrochlorothiazide) .... One by mouth daily 4)  Prozac 20 Mg Caps (Fluoxetine hcl) .... One by mouth daiyl 5)  Adderall 20 Mg Tabs (Amphetamine-dextroamphetamine) .... One by mouth bid 6)  Nasonex 50 Mcg/act Susp (Mometasone furoate) .... Two puffs each nostril daily as needed 7)  Aleve 220 Mg Tabs (Naproxen sodium) .... As needed\par 8)  Excederin For [employer] ?dose  .... As needed  Patient Instructions: 1)  Stay off symbicort 2)  Return as needed    CardioPerfect Spirometry  ID: [id number] Patient: [name], LIRA DOB: 06-Dec-1940 Age: 70 Years Old Sex: Female Race: White Physician: Stacie Glaze MD Height: 64.5 Weight: 178 PPD: 1.5 Status: Confirmed Past Medical History:  Hypertension Osteoarthritis DJD Current Problems:  ESOPHAGEAL REFLUX (ICD-530.81) COUGH (ICD-786.2) SEPTICEMIA DUE TO ESCHERICHIA COLI (ICD-038.42) OSTEOPENIA (ICD-733.90) ASTHMA UNSPECIFIED WITH EXACERBATION (ICD-493.92) FLU SYNDROME (ICD-487.1) PREVENTIVE HEALTH CARE (ICD-V70.0) GANGLION CYST (ICD-727.43) CAROTID BRUIT, LEFT (ICD-785.9) HYPERLIPIDEMIA NEC/NOS (ICD-272.4) OSTEOARTHRITIS (ICD-715.90) HYPERTENSION (ICD-401.9)   Recorded: 11/28/2009 12:16 AM  Parameter  Measured Predicted %Predicted FVC     2.98        3.00        99.30 FEV1     2.32        2.28        101.80 FEV1%   77.75        76.15        102.10 PEF    7.36        5.69        129.40   Comments: Normal spirometry   Interpretation: Pre: FVC= 2.98L FEV1= 2.32L FEV1%= 77.7% 2.32/2.98 FEV1/FVC (11/28/2009 12:18:32 PM), Within normal limits

## 2010-10-15 NOTE — Assessment & Plan Note (Signed)
Summary: 3 month rov/pap/njr   Vital Signs:  Patient profile:   70 year old female Height:      64.5 inches Weight:      165 pounds BMI:     27.99 Temp:     98.2 degrees F oral Pulse rate:   80 / minute Resp:     14 per minute BP sitting:   130 / 80  (left arm)  Vitals Entered By: Willy Eddy, LPN (Feb 04, 2010 2:50 PM)  Nutrition Counseling: Patient's BMI is greater than 25 and therefore counseled on weight management options. CC: roa, Hypertension Management   Primary Care Provider:  Dr Darryll Capers  CC:  roa and Hypertension Management.  History of Present Illness: Pt has continued to loose weight has had an increased  appetitie  Hypertension History:      She denies headache, chest pain, palpitations, dyspnea with exertion, orthopnea, PND, peripheral edema, visual symptoms, neurologic problems, syncope, and side effects from treatment.        Positive major cardiovascular risk factors include female age 3 years old or older, hyperlipidemia, and hypertension.  Negative major cardiovascular risk factors include non-tobacco-user status.     Preventive Screening-Counseling & Management  Alcohol-Tobacco     Smoking Status: never     Packs/Day: 1.5     Year Started: 1950     Year Quit: 1970     Pack years: 30  Current Problems (verified): 1)  Uri  (ICD-465.9) 2)  Uti  (ICD-599.0) 3)  Closed Fracture of Lateral Malleolus  (ICD-824.2) 4)  Closed Fracture of Metatarsal Bone  (ICD-825.25) 5)  Allergic Rhinitis  (ICD-477.9) 6)  Gerd  (ICD-530.81) 7)  Esophageal Reflux  (ICD-530.81) 8)  Cough  (ICD-786.2) 9)  Osteopenia  (ICD-733.90) 10)  Asthma Unspecified With Exacerbation  (ICD-493.92) 11)  Preventive Health Care  (ICD-V70.0) 12)  Ganglion Cyst  (ICD-727.43) 13)  Carotid Bruit, Left  (ICD-785.9) 14)  Hyperlipidemia Nec/nos  (ICD-272.4) 15)  Osteoarthritis  (ICD-715.90) 16)  Hypertension  (ICD-401.9)  Current Medications (verified): 1)  Wellbutrin Xl 300  Mg Tb24 (Bupropion Hcl) .... Take One Tablet By Mouth Daily 2)  Multivitamins   Tabs (Multiple Vitamin) .... Take One  Tablet By Mouth Daily 3)  Diovan Hct 320-25 Mg Tabs (Valsartan-Hydrochlorothiazide) .... One By Mouth Daily 4)  Prozac 20 Mg  Caps (Fluoxetine Hcl) .... One By Mouth Daiyl 5)  Adderall 20 Mg Tabs (Amphetamine-Dextroamphetamine) .... One By Mouth Bid 6)  Aleve 220 Mg Tabs (Naproxen Sodium) .... As Needed\par 7)  Excederin For [employer] ?dose .... As Needed  Allergies (verified): No Known Drug Allergies  Past History:  Family History: Last updated: 11/30/2008 Family History High cholesterol mother with PAD and carotid blockage brother-small cell carcinoma father-emphysema, asthma grandmother-asthma grandfather-CA ? type  Social History: Last updated: 11/30/2008 Retired.  Formerly worked [employer].   Married with children. Former Smoker.  Quit smoking 1970.  Smoked 1 1/2ppd x 20 years.   Risk Factors: Smoking Status: never (02/04/2010) Packs/Day: 1.5 (02/04/2010)  Past medical, surgical, family and social histories (including risk factors) reviewed, and no changes noted (except as noted below).  Past Medical History: Reviewed history from 11/30/2008 and no changes required. Hypertension Osteoarthritis DJD Current Problems:  ESOPHAGEAL REFLUX (ICD-530.81) COUGH (ICD-786.2) SEPTICEMIA DUE TO ESCHERICHIA COLI (ICD-038.42) OSTEOPENIA (ICD-733.90) ASTHMA UNSPECIFIED WITH EXACERBATION (ICD-493.92) FLU SYNDROME (ICD-487.1) PREVENTIVE HEALTH CARE (ICD-V70.0) GANGLION CYST (ICD-727.43) CAROTID BRUIT, LEFT (ICD-785.9) HYPERLIPIDEMIA NEC/NOS (ICD-272.4) OSTEOARTHRITIS (ICD-715.90) HYPERTENSION (ICD-401.9)  Past Surgical History: Reviewed history from 04/19/2007 and no changes required. Tubal ligation Total hip replacement left 2005 Knuckle replacement  Family History: Reviewed history from 11/30/2008 and no changes required. Family  History High cholesterol mother with PAD and carotid blockage brother-small cell carcinoma father-emphysema, asthma grandmother-asthma grandfather-CA ? type  Social History: Reviewed history from 11/30/2008 and no changes required. Retired.  Formerly worked [employer].   Married with children. Former Smoker.  Quit smoking 1970.  Smoked 1 1/2ppd x 20 years.   Review of Systems  The patient denies anorexia, fever, weight loss, weight gain, vision loss, decreased hearing, hoarseness, chest pain, syncope, dyspnea on exertion, peripheral edema, prolonged cough, headaches, hemoptysis, abdominal pain, melena, hematochezia, severe indigestion/heartburn, hematuria, incontinence, genital sores, muscle weakness, suspicious skin lesions, transient blindness, difficulty walking, depression, unusual weight change, abnormal bleeding, enlarged lymph nodes, angioedema, and breast masses.    Physical Exam  General:  alert and well-developed.   Head:  Normocephalic and atraumatic without obvious abnormalities. No apparent alopecia or balding. Eyes:  pupils equal and pupils round.   Ears:  R ear normal and L ear normal.   Nose:  no external deformity and no nasal discharge.   Neck:  supple and full ROM.   Lungs:  Normal respiratory effort, chest expands symmetrically. Lungs are clear to auscultation, no crackles or wheezes. Heart:  Normal rate and regular rhythm. S1 and S2 normal without gallop, murmur, click, rub or other extra sounds. Abdomen:  Bowel sounds positive,abdomen soft and non-tender without masses, organomegaly or hernias noted.   Impression & Recommendations:  Problem # 1:  ALLERGIC RHINITIS (ICD-477.9)  Discussed use of allergy medications and environmental measures.   Problem # 2:  HYPERLIPIDEMIA NEC/NOS (ICD-272.4)  Labs Reviewed: SGOT: 28 (05/24/2009)   SGPT: 26 (05/24/2009)  Prior 10 Yr Risk Heart Disease: 9 % (11/08/2008)   HDL:54.70 (05/24/2009), 69.9  (07/14/2007)  LDL:100 (05/24/2009), 117 (07/14/2007)  Chol:169 (05/24/2009), 193 (07/14/2007)  Trig:73.0 (05/24/2009), 30 (07/14/2007)  Problem # 3:  HYPERTENSION (ICD-401.9)  Her updated medication list for this problem includes:    Diovan Hct 320-25 Mg Tabs (Valsartan-hydrochlorothiazide) ..... One by mouth daily  BP today: 140/90 Prior BP: 118/74 (11/28/2009)  Prior 10 Yr Risk Heart Disease: 9 % (11/08/2008)  Labs Reviewed: K+: 3.9 (05/24/2009) Creat: : 0.8 (05/24/2009)   Chol: 169 (05/24/2009)   HDL: 54.70 (05/24/2009)   LDL: 100 (05/24/2009)   TG: 73.0 (05/24/2009)  Complete Medication List: 1)  Wellbutrin Xl 300 Mg Tb24 (Bupropion hcl) .... Take one tablet by mouth daily 2)  Multivitamins Tabs (Multiple vitamin) .... Take one  tablet by mouth daily 3)  Diovan Hct 320-25 Mg Tabs (Valsartan-hydrochlorothiazide) .... One by mouth daily 4)  Prozac 20 Mg Caps (Fluoxetine hcl) .... One by mouth daiyl 5)  Adderall 20 Mg Tabs (Amphetamine-dextroamphetamine) .... One by mouth bid 6)  Aleve 220 Mg Tabs (Naproxen sodium) .... As needed\par 7)  Excederin For [employer] ?dose  .... As needed  Hypertension Assessment/Plan:      The patient's hypertensive risk group is category B: At least one risk factor (excluding diabetes) with no target organ damage.  Her calculated 10 year risk of coronary heart disease is 9 %.  Today's blood pressure is 130/80.  Her blood pressure goal is < 140/90.  Patient Instructions: 1)  Please schedule a follow-up appointment in 3 months. Prescriptions: ADDERALL 20 MG TABS (AMPHETAMINE-DEXTROAMPHETAMINE) one by mouth BID  #60 x 0   Entered by:  Willy Eddy, LPN   Authorized by:   Stacie Glaze MD   Signed by:   Willy Eddy, LPN on 16/06/9603   Method used:   Print then Give to Patient   RxID:   [id number] ADDERALL 20 MG TABS (AMPHETAMINE-DEXTROAMPHETAMINE) one by mouth BID  #60 x 0   Entered by:   Willy Eddy, LPN   Authorized by:    Stacie Glaze MD   Signed by:   Willy Eddy, LPN on 21/30/8657   Method used:   Print then Give to Patient   RxID:   [id number] ADDERALL 20 MG TABS (AMPHETAMINE-DEXTROAMPHETAMINE) one by mouth BID  #60 x 0   Entered by:   Willy Eddy, LPN   Authorized by:   Stacie Glaze MD   Signed by:   Willy Eddy, LPN on 09/17/7251   Method used:   Print then Give to Patient   RxID:   [id number]

## 2010-10-15 NOTE — Assessment & Plan Note (Signed)
Summary: congested/ mbw   Copy to:  ER Primary Provider/Referring Provider:  Dr Darryll Capers  CC:  Pt c/o chest congestion x 3 to 4 days, producitve cough with green mucus, and and sore throat.  History of Present Illness:  70 year old female  with cyclic cough, GERD, asthmatic bronchitis.  This pt has had chronic cough for 6 months that comes and goes.  Had recent URI and developed pn drip with thick green mucous from nares and coughing up thick mucous.  Notes current spell lasting past 3 weeks.  Rx so far only symbicort  ? if samples have med?  not feeling any change with samples. On cough syrup and mucinex. Coughs all day and occ at night.   Pt is dyspeic with exertion.  Notes indigestion and heartburn.  Notes nasal congestion.  smoked 30pk years quit 1970. Has been on nexium only one week.  Pt was in hosp 08/24/2008 for bacteremia and UTI.    January 04, 2009 12:04  At the last ov we Rx: 1)  Start Symbicort two puff twice daily 2)  Start omeprazole one daily  3)  Reflux diet 4)  Prednisone 10mg  4 each am x3days, 3 x 3days, 2 x 3days, 1 x 3days then stop 5)  Avelox one daily until gone 6)  Nasonex two puff ea nostril daily   April 13, 2009 12:16 PM at last ov we rx: Stay on symbicort twice daily Use nasonex as needed When current bottle of omeprazole is empty, do not refill Continue to follow reflux diet Now is better with less cough.  No chest pain.  Some pndrip. Raspy cough is noted.  September 03, 2009--Presents for an acute office viist. Complains of bronchitis with chest congestion, dry cough, wheezing, increased SOB 2 weeks , worse for last 5 days, OTC not helping. No discolored mucus. Denies chest pain,  orthopnea, hemoptysis, fever, n/v/d, edema, headache.   October 18, 2009--Pt presents for an acute office visit. Pt c/o chest congestion x 3 to 4 days, producitve cough with green mucus, and sore throatl Last visit with similar symptoms , tx w/ zpack and steroids. She is  not taking her symbicorrt.  She is out. Does not take regularly. Denies chest pain,  , orthopnea, hemoptysis, fever, n/v/d, edema, headache.   Current Medications (verified): 1)  Wellbutrin Xl 300 Mg Tb24 (Bupropion Hcl) .... Take One Tablet By Mouth Daily 2)  Multivitamins   Tabs (Multiple Vitamin) .... Take One  Tablet By Mouth Daily 3)  Diovan Hct 320-25 Mg Tabs (Valsartan-Hydrochlorothiazide) .... One By Mouth Daily 4)  Prozac 20 Mg  Caps (Fluoxetine Hcl) .... One By Mouth Daiyl 5)  Adderall 20 Mg Tabs (Amphetamine-Dextroamphetamine) .... One By Mouth Bid 6)  Nasonex 50 Mcg/act  Susp (Mometasone Furoate) .... Two Puffs Each Nostril Daily As Needed 7)  Aleve 220 Mg Tabs (Naproxen Sodium) .... As Needed\par 8)  Excederin For [employer] ?dose .... As Needed 9)  Tussionex Pennkinetic Er 8-10 Mg/79ml Lqcr (Chlorpheniramine-Hydrocodone) .Marland Kitchen.. 1 Tsp Every 12 Hr As Needed Cough  Allergies (verified): No Known Drug Allergies  Past History:  Past Medical History: Last updated: 11/30/2008 Hypertension Osteoarthritis DJD Current Problems:  ESOPHAGEAL REFLUX (ICD-530.81) COUGH (ICD-786.2) SEPTICEMIA DUE TO ESCHERICHIA COLI (ICD-038.42) OSTEOPENIA (ICD-733.90) ASTHMA UNSPECIFIED WITH EXACERBATION (ICD-493.92) FLU SYNDROME (ICD-487.1) PREVENTIVE HEALTH CARE (ICD-V70.0) GANGLION CYST (ICD-727.43) CAROTID BRUIT, LEFT (ICD-785.9) HYPERLIPIDEMIA NEC/NOS (ICD-272.4) OSTEOARTHRITIS (ICD-715.90) HYPERTENSION (ICD-401.9)    Past Surgical History: Last updated: 04/19/2007 Tubal ligation Total  hip replacement left 2005 Knuckle replacement  Family History: Last updated: 11/30/2008 Family History High cholesterol mother with PAD and carotid blockage brother-small cell carcinoma father-emphysema, asthma grandmother-asthma grandfather-CA ? type  Social History: Last updated: 11/30/2008 Retired.  Formerly worked [employer].   Married with children. Former Smoker.  Quit  smoking 1970.  Smoked 1 1/2ppd x 20 years.   Risk Factors: Smoking Status: quit (07/20/2009) Packs/Day: 1.5 (08/14/2009)  Review of Systems      See HPI  Vital Signs:  Patient profile:   70 year old female Height:      64.5 inches Weight:      194 pounds O2 Sat:      94 % on Room air Temp:     97.4 degrees F oral Pulse rate:   81 / minute BP sitting:   132 / 86  (left arm) Cuff size:   regular  Vitals Entered By: Zackery Barefoot CMA (October 18, 2009 12:05 PM)  O2 Flow:  Room air CC: Pt c/o chest congestion x 3 to 4 days, producitve cough with green mucus, and sore throat Comments Medications reviewed with patient Verified pt's contact number Zackery Barefoot CMA  October 18, 2009 12:06 PM    Physical Exam  Additional Exam:  Gen: Pleasant, well-nourished, in no distress,  normal affect ENT: No lesions,  mouth clear,  oropharynx clear drainage.  no postnasal drip Neck: No JVD, no TMG, no carotid bruits Lungs:Coarse BS w/ faint exp wheezing Cardiovascular: RRR, heart sounds normal, no murmur or gallops, no peripheral edema Abdomen: soft and NT, no HSM,  BS normal Musculoskeletal: No deformities, no cyanosis or clubbing Neuro: alert, non focal Skin: Warm, no lesions or rashes    Impression & Recommendations:  Problem # 1:  ASTHMA UNSPECIFIED WITH EXACERBATION (ICD-493.92)  Recurrent flare.  xray pending.  REC: xopenex neb in office today.  Restart Symbicort 80/4.5mcg 2 pufs two times a day , brush, rinse and gargle after using ,then drink cup of water.  Avelox 400mg  once daily for 7 days Mucinex DM two times a day as needed cough /congestion  Increase fluids.  Tussionex 1 tsp every 12 hr as needed cough  Please contact office for sooner follow up if symptoms do not improve or worsen  follow up Dr. Delford Field in 4 weeks and as needed  The following medications were removed from the medication list:    Cipro 500 Mg Tabs (Ciprofloxacin hcl) ..... One by mouth two  times a day    Zithromax Z-pak 250 Mg Tabs (Azithromycin) .Marland Kitchen... Take as directed. Her updated medication list for this problem includes:    Avelox 400 Mg Tabs (Moxifloxacin hcl) .Marland Kitchen... 1 by mouth once daily  Orders: Est. Patient Level IV (16109)  Medications Added to Medication List This Visit: 1)  Symbicort 80-4.5 Mcg/act Aero (Budesonide-formoterol fumarate) .... 2 puffs two times a day 2)  Avelox 400 Mg Tabs (Moxifloxacin hcl) .Marland Kitchen.. 1 by mouth once daily 3)  Tussionex Pennkinetic Er 8-10 Mg/62ml Lqcr (Chlorpheniramine-hydrocodone) .Marland Kitchen.. 1 tsp every 12 hrs as needed cough  Complete Medication List: 1)  Wellbutrin Xl 300 Mg Tb24 (Bupropion hcl) .... Take one tablet by mouth daily 2)  Multivitamins Tabs (Multiple vitamin) .... Take one  tablet by mouth daily 3)  Diovan Hct 320-25 Mg Tabs (Valsartan-hydrochlorothiazide) .... One by mouth daily 4)  Prozac 20 Mg Caps (Fluoxetine hcl) .... One by mouth daiyl 5)  Adderall 20 Mg Tabs (Amphetamine-dextroamphetamine) .... One by  mouth bid 6)  Nasonex 50 Mcg/act Susp (Mometasone furoate) .... Two puffs each nostril daily as needed 7)  Aleve 220 Mg Tabs (Naproxen sodium) .... As needed\par 8)  Excederin For [employer] ?dose  .... As needed 9)  Tussionex Pennkinetic Er 8-10 Mg/30ml Lqcr (Chlorpheniramine-hydrocodone) .Marland Kitchen.. 1 tsp every 12 hr as needed cough 10)  Symbicort 80-4.5 Mcg/act Aero (Budesonide-formoterol fumarate) .... 2 puffs two times a day 11)  Avelox 400 Mg Tabs (Moxifloxacin hcl) .Marland Kitchen.. 1 by mouth once daily 12)  Tussionex Pennkinetic Er 8-10 Mg/93ml Lqcr (Chlorpheniramine-hydrocodone) .Marland Kitchen.. 1 tsp every 12 hrs as needed cough  Other Orders: Nebulizer Tx (78469) T-2 View CXR (71020TC)  Patient Instructions: 1)  Restart Symbicort 80/4.26mcg 2 pufs two times a day , brush, rinse and gargle after using ,then drink cup of water.  2)  Avelox 400mg  once daily for 7 days 3)  Mucinex DM two times a day as needed cough /congestion  4)  Increase  fluids.  5)  Tussionex 1 tsp every 12 hr as needed cough  6)  Please contact office for sooner follow up if symptoms do not improve or worsen  7)  follow up Dr. Delford Field in 4 weeks and as needed  Prescriptions: TUSSIONEX PENNKINETIC ER 8-10 MG/5ML LQCR (CHLORPHENIRAMINE-HYDROCODONE) 1 tsp every 12 hrs as needed cough  #4oz x 0   Entered and Authorized by:   Rubye Oaks NP   Signed by:   Tammy Parrett NP on 10/18/2009   Method used:   Print then Give to Patient   RxID:   [id number] AVELOX 400 MG TABS (MOXIFLOXACIN HCL) 1 by mouth once daily  #7 x 0   Entered and Authorized by:   Rubye Oaks NP   Signed by:   Tammy Parrett NP on 10/18/2009   Method used:   Electronically to        [facility]* (retail)       [address]       [place], [place]  [address]       Ph: [phone] or [phone]       Fax: [phone]   RxID:   [id number] SYMBICORT 80-4.5 MCG/ACT AERO (BUDESONIDE-FORMOTEROL FUMARATE) 2 puffs two times a day  #1 x 5   Entered and Authorized by:   Rubye Oaks NP   Signed by:   Tammy Parrett NP on 10/18/2009   Method used:   Electronically to        [facility] (retail)       [address]       [place], [place]  [address]       Ph: [phone] or [phone]       Fax: [phone]   RxID:   [id number]     Prevention & Chronic Care Immunizations   Influenza vaccine: Fluvax 3+  (07/20/2009)    Tetanus booster: 09/15/2005: Historical    Pneumococcal vaccine: Historical  (09/16/2003)    H. zoster vaccine: Not documented  Colorectal Screening   Hemoccult: Not documented    Colonoscopy: Not documented  Other Screening   Pap smear: normal  (09/15/2007)   Pap smear due: 09/2010    Mammogram: Not documented    DXA bone density scan: Not documented   Smoking status: quit  (07/20/2009)  Lipids   Total Cholesterol: 169  (05/24/2009)   LDL: 100  (05/24/2009)   LDL Direct:  Not documented  HDL: 54.70  (05/24/2009)   Triglycerides: 73.0  (05/24/2009)    SGOT (AST): 28  (05/24/2009)   SGPT (ALT): 26  (05/24/2009)   Alkaline phosphatase: 80  (05/24/2009)   Total bilirubin: 0.7  (05/24/2009)  Hypertension   Last Blood Pressure: 132 / 86  (10/18/2009)   Serum creatinine: 0.8  (05/24/2009)   Serum potassium 3.9  (05/24/2009)  Self-Management Support :    Hypertension self-management support: Not documented    Lipid self-management support: Not documented    Medication Administration  Medication # 1:    Medication: Xopenex 1.25mg     Diagnosis: URI (ICD-465.9)    Dose: 1 vial    Route: inhaled    Exp Date: 05/2010    Lot #: [id number]    Mfr: Sepracor    Patient tolerated medication without complications    Given by: Charolotte Capuchin  Orders Added: 1)  Nebulizer Tx [16010] 2)  T-2 View CXR [71020TC] 3)  Est. Patient Level IV [93235]   Appended Document: congested/ mbw I agree with this plan of care   pw

## 2010-10-15 NOTE — Progress Notes (Signed)
Summary: REQ FOR APPT / MEDICATION CONCERNS  Phone Note Call from Patient   Caller: Patient  [phone] Reason for Call: Acute Illness Summary of Call: Pt called to adv that she has lost interest in things she once enjoyed and she feels like she cannot stay on top of things which stresses her out, weight gain, memory problems,  loss of motivation toward anything,  insomnia - just not happy x 2 mths and she doesn't know why, just feels like she is a mess.... Pt doesn't know if she needs to have med dosage changed or what.... Pt would like to come in for evaluation soon, doesn't feel like she can go on much longer... Pt going out of town from Friday at lunch till Monday... I scheduled pt for appt on 9/20 (first available) but she feels like she needs to be seen before then.... Can you advise?   Initial call taken by: Debbra Riding,  May 13, 2010 3:05 PM  Follow-up for Phone Call        we absolutely dont have anything right now, but if we have a cancellation earlier we will call he. he is on vacation for 10 days starting next week Follow-up by: Willy Eddy, LPN,  May 13, 2010 3:56 PM  Additional Follow-up for Phone Call Additional follow up Details #1::        Pt advised of same.  Additional Follow-up by: Debbra Riding,  May 13, 2010 4:03 PM     Appended Document: REQ FOR APPT / MEDICATION CONCERNS Appt was given to pt for 9/1  but she called back and rescheduled for  9/13.

## 2010-10-15 NOTE — Assessment & Plan Note (Signed)
Summary: 3 month rov/njr/pt resched/cb----PT RSC (BMP) // RS   Vital Signs:  Patient profile:   70 year old female Height:      64.5 inches Weight:      175 pounds BMI:     29.68 Temp:     98.2 degrees F oral Pulse rate:   76 / minute Resp:     14 per minute BP sitting:   138 / 75  (left arm)  Vitals Entered By: Willy Eddy, LPN (August 07, 2010 3:45 PM)  Nutrition Counseling: Patient's BMI is greater than 25 and therefore counseled on weight management options. CC: roa- feels sluggish, Hypertension Management Is Patient Diabetic? No   Primary Care Provider:  Dr Darryll Capers  CC:  roa- feels sluggish and Hypertension Management.  History of Present Illness:  Follow-Up Visit      This is a 70 year old woman who presents for Follow-up visit.  The patient denies chest pain, palpitations, dizziness, syncope, low blood sugar symptoms, high blood sugar symptoms, edema, SOB, DOE, PND, and orthopnea.  Since the last visit the patient notes no new problems or concerns.  The patient reports taking meds as prescribed.  When questioned about possible medication side effects, the patient notes none.    Hypertension History:      She denies headache, chest pain, palpitations, dyspnea with exertion, orthopnea, PND, peripheral edema, visual symptoms, neurologic problems, syncope, and side effects from treatment.        Positive major cardiovascular risk factors include female age 71 years old or older, hyperlipidemia, and hypertension.  Negative major cardiovascular risk factors include non-tobacco-user status.     Preventive Screening-Counseling & Management  Alcohol-Tobacco     Smoking Status: never     Packs/Day: 1.5     Year Started: 1950     Year Quit: 1970     Pack years: 30  Problems Prior to Update: 1)  Insomnia  (ICD-780.52) 2)  Uri  (ICD-465.9) 3)  Uti  (ICD-599.0) 4)  Closed Fracture of Lateral Malleolus  (ICD-824.2) 5)  Closed Fracture of Metatarsal Bone   (ICD-825.25) 6)  Allergic Rhinitis  (ICD-477.9) 7)  Gerd  (ICD-530.81) 8)  Esophageal Reflux  (ICD-530.81) 9)  Cough  (ICD-786.2) 10)  Osteopenia  (ICD-733.90) 11)  Asthma Unspecified With Exacerbation  (ICD-493.92) 12)  Preventive Health Care  (ICD-V70.0) 13)  Ganglion Cyst  (ICD-727.43) 14)  Carotid Bruit, Left  (ICD-785.9) 15)  Hyperlipidemia Nec/nos  (ICD-272.4) 16)  Osteoarthritis  (ICD-715.90) 17)  Hypertension  (ICD-401.9)  Current Problems (verified): 1)  Uri  (ICD-465.9) 2)  Uti  (ICD-599.0) 3)  Closed Fracture of Lateral Malleolus  (ICD-824.2) 4)  Closed Fracture of Metatarsal Bone  (ICD-825.25) 5)  Allergic Rhinitis  (ICD-477.9) 6)  Gerd  (ICD-530.81) 7)  Esophageal Reflux  (ICD-530.81) 8)  Cough  (ICD-786.2) 9)  Osteopenia  (ICD-733.90) 10)  Asthma Unspecified With Exacerbation  (ICD-493.92) 11)  Preventive Health Care  (ICD-V70.0) 12)  Ganglion Cyst  (ICD-727.43) 13)  Carotid Bruit, Left  (ICD-785.9) 14)  Hyperlipidemia Nec/nos  (ICD-272.4) 15)  Osteoarthritis  (ICD-715.90) 16)  Hypertension  (ICD-401.9)  Medications Prior to Update: 1)  Wellbutrin Xl 300 Mg Tb24 (Bupropion Hcl) .... Take One Tablet By Mouth Daily 2)  Multivitamins   Tabs (Multiple Vitamin) .... Take One  Tablet By Mouth Daily 3)  Diovan Hct 320-25 Mg Tabs (Valsartan-Hydrochlorothiazide) .... One By Mouth Daily 4)  Prozac 20 Mg  Caps (Fluoxetine Hcl) .... One By  Mouth Daiyl 5)  Adderall 20 Mg Tabs (Amphetamine-Dextroamphetamine) .... One By Mouth Bid 6)  Aleve 220 Mg Tabs (Naproxen Sodium) .... As Needed\par 7)  Excederin For [employer] ?dose .... As Needed  Current Medications (verified): 1)  Wellbutrin Xl 300 Mg Tb24 (Bupropion Hcl) .... Take One Tablet By Mouth Daily 2)  Multivitamins   Tabs (Multiple Vitamin) .... Take One  Tablet By Mouth Daily 3)  Diovan Hct 320-25 Mg Tabs (Valsartan-Hydrochlorothiazide) .... One By Mouth Daily 4)  Prozac 20 Mg  Caps (Fluoxetine Hcl) .... One By Mouth  Daiyl 5)  Adderall 20 Mg Tabs (Amphetamine-Dextroamphetamine) .... One By Mouth Bid 6)  Aleve 220 Mg Tabs (Naproxen Sodium) .... As Needed\par 7)  Excederin For [employer] ?dose .... As Needed 8)  Silenor 6 Mg Tabs (Doxepin Hcl) .... On By Mouth Q Hs  Allergies (verified): No Known Drug Allergies  Past History:  Family History: Last updated: 11/30/2008 Family History High cholesterol mother with PAD and carotid blockage brother-small cell carcinoma father-emphysema, asthma grandmother-asthma grandfather-CA ? type  Social History: Last updated: 11/30/2008 Retired.  Formerly worked [employer].   Married with children. Former Smoker.  Quit smoking 1970.  Smoked 1 1/2ppd x 20 years.   Risk Factors: Smoking Status: never (08/07/2010) Packs/Day: 1.5 (08/07/2010)  Past medical, surgical, family and social histories (including risk factors) reviewed, and no changes noted (except as noted below).  Past Medical History: Reviewed history from 11/30/2008 and no changes required. Hypertension Osteoarthritis DJD Current Problems:  ESOPHAGEAL REFLUX (ICD-530.81) COUGH (ICD-786.2) SEPTICEMIA DUE TO ESCHERICHIA COLI (ICD-038.42) OSTEOPENIA (ICD-733.90) ASTHMA UNSPECIFIED WITH EXACERBATION (ICD-493.92) FLU SYNDROME (ICD-487.1) PREVENTIVE HEALTH CARE (ICD-V70.0) GANGLION CYST (ICD-727.43) CAROTID BRUIT, LEFT (ICD-785.9) HYPERLIPIDEMIA NEC/NOS (ICD-272.4) OSTEOARTHRITIS (ICD-715.90) HYPERTENSION (ICD-401.9)    Past Surgical History: Reviewed history from 04/19/2007 and no changes required. Tubal ligation Total hip replacement left 2005 Knuckle replacement  Family History: Reviewed history from 11/30/2008 and no changes required. Family History High cholesterol mother with PAD and carotid blockage brother-small cell carcinoma father-emphysema, asthma grandmother-asthma grandfather-CA ? type  Social History: Reviewed history from 11/30/2008 and no changes  required. Retired.  Formerly worked [employer].   Married with children. Former Smoker.  Quit smoking 1970.  Smoked 1 1/2ppd x 20 years.   Review of Systems       The patient complains of depression.  The patient denies anorexia, fever, weight loss, weight gain, vision loss, decreased hearing, hoarseness, chest pain, syncope, dyspnea on exertion, peripheral edema, prolonged cough, headaches, hemoptysis, abdominal pain, melena, hematochezia, severe indigestion/heartburn, hematuria, incontinence, genital sores, muscle weakness, suspicious skin lesions, transient blindness, difficulty walking, unusual weight change, abnormal bleeding, enlarged lymph nodes, angioedema, and breast masses.    Physical Exam  General:  alert and well-developed.   Head:  Normocephalic and atraumatic without obvious abnormalities. No apparent alopecia or balding. Eyes:  pupils equal and pupils round.   Ears:  R ear normal and L ear normal.   Nose:  no external deformity and no nasal discharge.   Mouth:  good dentition and pharynx pink and moist.   Neck:  supple and full ROM.   Lungs:  Normal respiratory effort, chest expands symmetrically. Lungs are clear to auscultation, no crackles or wheezes. Heart:  Normal rate and regular rhythm. S1 and S2 normal without gallop, murmur, click, rub or other extra sounds. Abdomen:  Bowel sounds positive,abdomen soft and non-tender without masses, organomegaly or hernias noted.   Impression & Recommendations:  Problem # 1:  ESOPHAGEAL  REFLUX (ICD-530.81)  Labs Reviewed: Hgb: 14.4 (05/24/2009)   Hct: 42.6 (05/24/2009)  Problem # 2:  HYPERLIPIDEMIA NEC/NOS (ICD-272.4)  Labs Reviewed: SGOT: 28 (05/24/2009)   SGPT: 26 (05/24/2009)  Prior 10 Yr Risk Heart Disease: 9 % (11/08/2008)   HDL:54.70 (05/24/2009), 69.9 (07/14/2007)  LDL:100 (05/24/2009), 117 (07/14/2007)  Chol:169 (05/24/2009), 193 (07/14/2007)  Trig:73.0 (05/24/2009), 30 (07/14/2007)  Problem # 3:   OSTEOARTHRITIS (ICD-715.90)  Her updated medication list for this problem includes:    Aleve 220 Mg Tabs (Naproxen sodium) .Marland Kitchen... As needed\  Discussed use of medications, application of heat or cold, and exercises.   Problem # 4:  INSOMNIA (ICD-780.52)  Discussed sleep hygiene.   Her updated medication list for this problem includes:    Silenor 6 Mg Tabs (Doxepin hcl) ..... On by mouth q hs  Problem # 5:  ASTHMA UNSPECIFIED WITH EXACERBATION (ZOX-096.04)  Pulmonary Functions Reviewed: O2 sat: 95 (11/28/2009)  Complete Medication List: 1)  Wellbutrin Xl 300 Mg Tb24 (Bupropion hcl) .... Take one tablet by mouth daily 2)  Multivitamins Tabs (Multiple vitamin) .... Take one  tablet by mouth daily 3)  Diovan Hct 320-25 Mg Tabs (Valsartan-hydrochlorothiazide) .... One by mouth daily 4)  Prozac 20 Mg Caps (Fluoxetine hcl) .... One by mouth daiyl 5)  Adderall 20 Mg Tabs (Amphetamine-dextroamphetamine) .... One by mouth bid 6)  Aleve 220 Mg Tabs (Naproxen sodium) .... As needed\par 7)  Excederin For [employer] ?dose  .... As needed 8)  Silenor 6 Mg Tabs (Doxepin hcl) .... On by mouth q hs  Other Orders: UA Dipstick w/Micro (automated) (81001)  Hypertension Assessment/Plan:      The patient's hypertensive risk group is category B: At least one risk factor (excluding diabetes) with no target organ damage.  Her calculated 10 year risk of coronary heart disease is 9 %.  Today's blood pressure is 138/75.  Her blood pressure goal is < 140/90.  Patient Instructions: 1)  early feb for follow Prescriptions: ADDERALL 20 MG TABS (AMPHETAMINE-DEXTROAMPHETAMINE) one by mouth BID  #60 x 0   Entered by:   Willy Eddy, LPN   Authorized by:   Stacie Glaze MD   Signed by:   Willy Eddy, LPN on 54/05/8118   Method used:   Print then Give to Patient   RxID:   [id number] ADDERALL 20 MG TABS (AMPHETAMINE-DEXTROAMPHETAMINE) one by mouth BID  #60 x 0   Entered by:   Willy Eddy,  LPN   Authorized by:   Stacie Glaze MD   Signed by:   Willy Eddy, LPN on 84/69/6295   Method used:   Print then Give to Patient   RxID:   [id number] ADDERALL 20 MG TABS (AMPHETAMINE-DEXTROAMPHETAMINE) one by mouth BID  #60 x 0   Entered by:   Willy Eddy, LPN   Authorized by:   Stacie Glaze MD   Signed by:   Willy Eddy, LPN on 66/44/0347   Method used:   Print then Give to Patient   RxID:   [id number]    Orders Added: 1)  UA Dipstick w/Micro (automated) [81001] 2)  Est. Patient Level IV [51884]    Laboratory Results   Urine Tests  Date/Time Recieved: August 07, 2010 4:03 PM  Date/Time Reported: August 07, 2010 4:03 PM   Routine Urinalysis   Color: yellow Appearance: Clear Glucose: negative   (Normal Range: Negative) Bilirubin: negative   (Normal Range: Negative) Ketone: negative   (Normal Range:  Negative) Spec. Gravity: 1.020   (Normal Range: 1.003-1.035) Blood: trace-intact   (Normal Range: Negative) pH: 6.5   (Normal Range: 5.0-8.0) Protein: negative   (Normal Range: Negative) Urobilinogen: 0.2   (Normal Range: 0-1) Nitrite: negative   (Normal Range: Negative) Leukocyte Esterace: trace   (Normal Range: Negative)    Comments: Wynona Canes, CMA  August 07, 2010 4:03 PM

## 2010-10-15 NOTE — Assessment & Plan Note (Signed)
Summary: chest congestion/ cough- PW's pt //kp   Visit Type:  Acute visit Copy to:  ER Primary Provider/Referring Provider:  Dr Darryll Capers  CC:  Pt of Dr. Lynelle Doctor. Pt c/o chest tightness, dry cough, chest congestion, losing voice, and PND starting yesterday. Pt requesting flu vaccine if well enough .  History of Present Illness:  70 year old female  with cyclic cough, GERD doubt true asthma   November 28, 2009 12:04 PM  episode of cough >> improved with  avelox and mucinex now:  no cough now, not dyspneic. She is back to baseline.  No wheeze   August 22, 2010 3:52 PM  c/o chest tightness, dry cough, chest congestion, losing voice, and PND starting yesterday. Pt requesting flu vaccine if well enough . Wants to avoid situation like earlier   Preventive Screening-Counseling & Management  Alcohol-Tobacco     Smoking Status: quit     Packs/Day: 1.5     Year Started: 1950     Year Quit: 1970     Pack years: 30  Current Medications (verified): 1)  Wellbutrin Xl 300 Mg Tb24 (Bupropion Hcl) .... Take One Tablet By Mouth Daily 2)  Multivitamins   Tabs (Multiple Vitamin) .... Take One  Tablet By Mouth Daily 3)  Diovan Hct 320-25 Mg Tabs (Valsartan-Hydrochlorothiazide) .... One By Mouth Daily 4)  Prozac 20 Mg  Caps (Fluoxetine Hcl) .... One By Mouth Daiyl 5)  Adderall 20 Mg Tabs (Amphetamine-Dextroamphetamine) .... One By Mouth Bid 6)  Aleve 220 Mg Tabs (Naproxen Sodium) .... As Needed\par 7)  Excederin For [employer] ?dose .... As Needed 8)  Silenor 6 Mg Tabs (Doxepin Hcl) .... On By Mouth Q Hs  Allergies (verified): No Known Drug Allergies  Past History:  Past Medical History: Last updated: 11/30/2008 Hypertension Osteoarthritis DJD Current Problems:  ESOPHAGEAL REFLUX (ICD-530.81) COUGH (ICD-786.2) SEPTICEMIA DUE TO ESCHERICHIA COLI (ICD-038.42) OSTEOPENIA (ICD-733.90) ASTHMA UNSPECIFIED WITH EXACERBATION (ICD-493.92) FLU SYNDROME (ICD-487.1) PREVENTIVE HEALTH CARE  (ICD-V70.0) GANGLION CYST (ICD-727.43) CAROTID BRUIT, LEFT (ICD-785.9) HYPERLIPIDEMIA NEC/NOS (ICD-272.4) OSTEOARTHRITIS (ICD-715.90) HYPERTENSION (ICD-401.9)    Social History: Last updated: 11/30/2008 Retired.  Formerly worked [employer].   Married with children. Former Smoker.  Quit smoking 1970.  Smoked 1 1/2ppd x 20 years.   Social History: Smoking Status:  quit  Review of Systems       The patient complains of dyspnea on exertion and prolonged cough.  The patient denies anorexia, fever, weight loss, weight gain, vision loss, decreased hearing, hoarseness, chest pain, syncope, peripheral edema, headaches, hemoptysis, abdominal pain, melena, hematochezia, severe indigestion/heartburn, hematuria, incontinence, genital sores, muscle weakness, suspicious skin lesions, transient blindness, difficulty walking, depression, unusual weight change, abnormal bleeding, enlarged lymph nodes, and angioedema.    Vital Signs:  Patient profile:   70 year old female Height:      64.5 inches Weight:      182.2 pounds BMI:     30.90 O2 Sat:      99 % on Room air Temp:     97.6 degrees F oral Pulse rate:   85 / minute BP sitting:   126 / 80  (left arm) Cuff size:   regular  Vitals Entered By: Zackery Barefoot CMA (August 22, 2010 3:41 PM)  O2 Flow:  Room air CC: Pt of Dr. Lynelle Doctor. Pt c/o chest tightness, dry cough, chest congestion, losing voice, PND starting yesterday. Pt requesting flu vaccine if well enough  Comments Medications reviewed with patient Verified contact number  and pharmacy with patient Zackery Barefoot CMA  August 22, 2010 3:41 PM    Physical Exam  Additional Exam:  Gen: Pleasant, well-nourished, in no distress,  normal affect ENT: No lesions,  mouth clear,  oropharynx clear drainage.  no postnasal drip Neck: No JVD, no TMG, no carotid bruits Lungs:clear, faint rhonchi Cardiovascular: RRR, heart sounds normal, no murmur or gallops, no peripheral  edema Musculoskeletal: No deformities, no cyanosis or clubbing Skin: Warm, no lesions or rashes    Impression & Recommendations:  Problem # 1:  COUGH (ICD-786.2)  URI + bronchitis Mucinex ok Z-pak out of formulary, hence doxy - take if gren phlegm Albuterol MDI as needed only - try to avoid steroids here.  Orders: Est. Patient Level III (16109) Prescription Created Electronically [phone])  Medications Added to Medication List This Visit: 1)  Doxycycline Monohydrate 100 Mg Caps (Doxycycline monohydrate) .... Once daily  Other Orders: Flu Vaccine 61yrs + (09811) Admin 1st Vaccine (91478)  Patient Instructions: 1)  Copy sent to: Dr Lovell Sheehan 2)  Please schedule a follow-up appointment as needed. 3)  Flu shot 4)  Take mucinex or Robitussin DM 2 tsp three times a day 5)  Antibiotic Rx  - to use if worse or green phlegm 6)  Albuterol MDI- sample 2 puffs as needed three times a day  7)  Call if worse Prescriptions: DOXYCYCLINE MONOHYDRATE 100 MG CAPS (DOXYCYCLINE MONOHYDRATE) once daily  #7 x 0   Entered and Authorized by:   Comer Locket Vassie Loll MD   Signed by:   Comer Locket Vassie Loll MD on 08/22/2010   Method used:   Electronically to        [facility] (retail)       [address]       [place], [place]  [address]       Ph: [phone] or [phone]       Fax: [phone]   RxID:   [id number]     Immunizations Administered:  Influenza Vaccine # 1:    Vaccine Type: Fluvax 3+    Site: left deltoid    Mfr: GlaxoSmithKline    Dose: 0.5 ml    Route: IM    Given by: Zackery Barefoot CMA    Exp. Date: 03/15/2011    Lot #: [id number]    VIS given: 04/09/10 version given August 22, 2010.  Flu Vaccine Consent Questions:    Do you have a history of severe allergic reactions to this vaccine? no    Any prior history of allergic reactions to egg and/or gelatin? no    Do you have a sensitivity to the preservative Thimersol? no    Do you have a past  history of Guillan-Barre Syndrome? no    Do you currently have an acute febrile illness? no    Have you ever had a severe reaction to latex? no    Vaccine information given and explained to patient? yes    Are you currently pregnant? no

## 2010-10-15 NOTE — Letter (Signed)
Summary: Generic Letter  [place] [facility]  [address] [place], [place] [address]   Phone: [phone]  Fax: [phone]    02/04/2010  [facility] [address] [place], [place]  [address]  Dear Ms. Hinchman,  This will serve as duccumentation that I have prescribed the metafast diet program for weigth loss. This weigth loss has been medcially necessary due to  risks of hypertension, osteoarthritis and age.  The term of the prescription is for the calender year 2011        Sincerely,   Darryll Capers MD

## 2010-10-15 NOTE — Progress Notes (Signed)
Summary: tick bite  Phone Note Call from Patient   Summary of Call: Pt was bitten by a deer tick, and has a small red area.  Asking if she should take an antibiotic?? Advised to watch for fever, rash, and increased redness at the site. [phone] Initial call taken by: Lynann Beaver CMA,  Feb 07, 2010 10:03 AM  Follow-up for Phone Call        dr Lovell Sheehan agrees Follow-up by: Willy Eddy, LPN,  Feb 07, 2010 10:04 AM

## 2010-10-17 ENCOUNTER — Other Ambulatory Visit: Payer: Self-pay | Admitting: *Deleted

## 2010-10-17 ENCOUNTER — Other Ambulatory Visit: Payer: Self-pay | Admitting: Internal Medicine

## 2010-10-17 DIAGNOSIS — I10 Essential (primary) hypertension: Secondary | ICD-10-CM

## 2010-10-17 MED ORDER — VALSARTAN-HYDROCHLOROTHIAZIDE 320-25 MG PO TABS: 1.0000 | ORAL_TABLET | Freq: Every day | ORAL | Status: DC

## 2010-10-17 NOTE — Progress Notes (Signed)
Summary: adderall rx  Phone Note Call from Patient Call back at Home Phone [phone]   Caller: Patient Reason for Call: Talk to Nurse, Talk to Doctor Summary of Call: patient is calling because pharmacy does not have 20mg  adderall.  the do have the 10mg  adderall.  can she have these re-written. [facility].  patient is taking 2 - 20mg  tabs two times a day.   Initial call taken by: Kern Reap CMA (AAMA),  September 17, 2010 9:26 AM    New/Updated Medications: ADDERALL 10 MG TABS (AMPHETAMINE-DEXTROAMPHETAMINE) 2 tabs two times a day Prescriptions: ADDERALL 10 MG TABS (AMPHETAMINE-DEXTROAMPHETAMINE) 2 tabs two times a day  #120 x 0   Entered by:   Willy Eddy, LPN   Authorized by:   Stacie Glaze MD   Signed by:   Willy Eddy, LPN on 47/82/9562   Method used:   Print then Give to Patient   RxID:   [id number] ADDERALL 10 MG TABS (AMPHETAMINE-DEXTROAMPHETAMINE) 2 tabs two times a day  #120 x 0   Entered by:   Willy Eddy, LPN   Authorized by:   Stacie Glaze MD   Signed by:   Willy Eddy, LPN on 84/13/2440   Method used:   Print then Give to Patient   RxID:   [id number]

## 2010-10-23 NOTE — Progress Notes (Signed)
Summary: sick-LINE BUSY X 3  Phone Note Call from Patient Call back at Home Phone [phone]   Caller: Patient Call For: wright Reason for Call: Acute Illness, Talk to Nurse Summary of Call: Patient callin stating that she has a bad cough, coughing up green mucus, congested x 1 week.  Reqeusting abx.  [facility] Initial call taken by: Lehman Prom,  October 14, 2010 10:01 AM  Follow-up for Phone Call        called and spoke with pt.  pt c/o a "chest infection x 1 week."  States she is coughing up green sputum and also has green nasal drainage.  denies fever, sore throat or increased sob.  offered to schedule pt an appt today.  pt declined stating she was unable to come in today to be seen.  Pt states she has been taking Mucinex DM and Robitussin with no relief of symptoms.  Aundra Millet Reynolds LPN  October 14, 2010 10:25 AM   Additional Follow-up for Phone Call Additional follow up Details #1::        call in Zpak times one pak:  500mg  x one dose then 250mg  /d for 4 days and stop btw I had signed off this case in 3/11 she will need to reestablish I she does not have aprimary pulm problem ?seepcp again? Additional Follow-up by: Storm Frisk MD,  October 14, 2010 12:15 PM    Additional Follow-up for Phone Call Additional follow up Details #2::    ATC x 2- line busy, WCB. Vernie Murders  October 14, 2010 2:23 PM  ATC again and line was still busy, WCB Vernie Murders  October 14, 2010 4:15 PM   Additional Follow-up for Phone Call Additional follow up Details #3:: Details for Additional Follow-up Action Taken: Spoke with pt and notified of the above recs per PW.  Pt verbalized understanding.  Rx sent to pharm.  Pt states that she will followup with PCP and ask if still needs to be seen by pulm.   Additional Follow-up by: Vernie Murders,  October 14, 2010 5:33 PM  New/Updated Medications: ZITHROMAX Z-PAK 250 MG TABS (AZITHROMYCIN) take as directed Prescriptions: ZITHROMAX  Z-PAK 250 MG TABS (AZITHROMYCIN) take as directed  #1 x 0   Entered by:   Vernie Murders   Authorized by:   Storm Frisk MD   Signed by:   Vernie Murders on 10/14/2010   Method used:   Electronically to        [facility] (retail)       [address]       [place], [place]  [address]       Ph: [phone] or [phone]       Fax: [phone]   RxID:   [id number]

## 2010-10-25 ENCOUNTER — Encounter: Payer: Self-pay | Admitting: Internal Medicine

## 2010-10-25 ENCOUNTER — Ambulatory Visit (INDEPENDENT_AMBULATORY_CARE_PROVIDER_SITE_OTHER): Payer: BC Managed Care – PPO | Admitting: Internal Medicine

## 2010-10-25 VITALS — BP 140/80 | HR 76 | Temp 98.8°F | Resp 14 | Ht 64.0 in | Wt 180.0 lb

## 2010-10-25 DIAGNOSIS — N390 Urinary tract infection, site not specified: Secondary | ICD-10-CM

## 2010-10-25 LAB — POCT URINALYSIS DIPSTICK
Bilirubin, UA: NEGATIVE
Glucose, UA: NEGATIVE
Ketones, UA: NEGATIVE
Nitrite, UA: NEGATIVE
pH, UA: 5.5

## 2010-10-25 MED ORDER — NITROFURANTOIN MONOHYD MACRO 100 MG PO CAPS: 100.0000 mg | ORAL_CAPSULE | Freq: Two times a day (BID) | ORAL | Status: AC

## 2010-10-25 NOTE — Assessment & Plan Note (Signed)
Patient has had recurrent urinary tract infections are probably related to incontinence due to coughing and sneezing.   she admits that her urinary tract infection seemed to occur after she's had an upper respiratory tract infection.  She gets concerned when she has a urinary tract infection because once before she is gone septicemia with out having timely treatment.  Therefore she has high anxiety when she has these symptoms for the past 24 hours she's had dysuria burning and frequency without back pain or fever she appears to have an early uncomplicated urinary tract infection for which we can use Macrobid 100 mg by mouth twice a day for 7 days she is an office visit on Monday which will be a good followup for this problem we did discuss preventive measures such as immediately wearing a one driving pad when she develops an upper respiratory tract infection so that when she coughs or sneezes she does not create wetness which becomes in a wicking affect for urinary tract infection

## 2010-10-25 NOTE — Progress Notes (Signed)
  Subjective:    Patient ID: ERNIE SAGRERO, female    DOB: [birth year], 70 y.o.   MRN: [id number]  Urinary Tract Infection  This is a recurrent problem. The current episode started yesterday. The problem occurs every urination. The problem has been gradually worsening. The quality of the pain is described as burning. The pain is at a severity of 3/10. The pain is mild. There has been no fever. She is sexually active. There is a history of pyelonephritis. Associated symptoms include frequency and urgency. Pertinent negatives include no chills, discharge, flank pain, hematuria or vomiting. She has tried increased fluids for the symptoms. The treatment provided no relief. Her past medical history is significant for recurrent UTIs.      Review of Systems  Constitutional: Negative for chills, activity change, appetite change and fatigue.  HENT: Negative for ear pain, congestion, neck pain, postnasal drip and sinus pressure.   Eyes: Negative for redness and visual disturbance.  Respiratory: Negative for cough, shortness of breath and wheezing.   Gastrointestinal: Negative for vomiting, abdominal pain and abdominal distention.  Genitourinary: Positive for urgency, frequency and difficulty urinating. Negative for dysuria, hematuria, flank pain and menstrual problem.  Musculoskeletal: Negative for myalgias, joint swelling and arthralgias.  Skin: Negative for rash and wound.  Neurological: Negative for dizziness, weakness and headaches.  Hematological: Negative for adenopathy. Does not bruise/bleed easily.  Psychiatric/Behavioral: Negative for sleep disturbance and decreased concentration.       Objective:   Physical Exam  Constitutional: She is oriented to person, place, and time. She appears well-developed and well-nourished. No distress.  HENT:  Head: Normocephalic and atraumatic.  Right Ear: External ear normal.  Left Ear: External ear normal.  Nose: Nose normal.  Mouth/Throat: Oropharynx  is clear and moist.  Eyes: Conjunctivae and EOM are normal. Pupils are equal, round, and reactive to light.  Neck: Normal range of motion. Neck supple. No JVD present. No tracheal deviation present. No thyromegaly present.  Cardiovascular: Normal rate, regular rhythm, normal heart sounds and intact distal pulses.   No murmur heard. Pulmonary/Chest: Effort normal and breath sounds normal. She has no wheezes. She exhibits no tenderness.  Abdominal: Soft. Bowel sounds are normal.  Musculoskeletal: Normal range of motion. She exhibits no edema and no tenderness.  Lymphadenopathy:    She has no cervical adenopathy.  Neurological: She is alert and oriented to person, place, and time. She has normal reflexes. No cranial nerve deficit.  Skin: Skin is warm and dry. She is not diaphoretic.  Psychiatric: She has a normal mood and affect. Her behavior is normal.          Assessment & Plan:

## 2010-10-28 ENCOUNTER — Ambulatory Visit (INDEPENDENT_AMBULATORY_CARE_PROVIDER_SITE_OTHER): Payer: BC Managed Care – PPO | Admitting: Internal Medicine

## 2010-10-28 ENCOUNTER — Encounter: Payer: Self-pay | Admitting: Internal Medicine

## 2010-10-28 VITALS — BP 130/80 | HR 76 | Temp 98.3°F | Resp 14 | Ht 64.0 in | Wt 180.0 lb

## 2010-10-28 DIAGNOSIS — F418 Other specified anxiety disorders: Secondary | ICD-10-CM | POA: Insufficient documentation

## 2010-10-28 DIAGNOSIS — I10 Essential (primary) hypertension: Secondary | ICD-10-CM

## 2010-10-28 DIAGNOSIS — J309 Allergic rhinitis, unspecified: Secondary | ICD-10-CM

## 2010-10-28 DIAGNOSIS — E785 Hyperlipidemia, unspecified: Secondary | ICD-10-CM

## 2010-10-28 DIAGNOSIS — F329 Major depressive disorder, single episode, unspecified: Secondary | ICD-10-CM

## 2010-10-28 DIAGNOSIS — M1990 Unspecified osteoarthritis, unspecified site: Secondary | ICD-10-CM

## 2010-10-28 MED ORDER — AMPHETAMINE-DEXTROAMPHETAMINE 10 MG PO TABS: 20.0000 mg | ORAL_TABLET | Freq: Two times a day (BID) | ORAL | Status: DC

## 2010-10-28 NOTE — Assessment & Plan Note (Signed)
Currently at goal for her hyperlipidemia on current medication regimen

## 2010-10-28 NOTE — Assessment & Plan Note (Signed)
Patient is currently stable on triple therapy she's been on Prozac since menopause we added Wellbutrin 300 mg extended release and have added Adderall 10 mg by mouth daily this combination has helped her depression significantly

## 2010-10-28 NOTE — Progress Notes (Signed)
  Subjective:    Patient ID: [name], female    DOB: [birth year], 70 y.o.   MRN: [id number]  HPI   patient is a 70 year old white female who presents for followup of hyperlipidemia hypertension osteoarthritis and for monitoring of her antidepressant medications.  She's been stable on her current medication rate regimen she presents for refill of her Adderall which requires a refill every 90 days she states that she feels good and is coping well with his current medications.  She has gained weight recently and admits to not following her diet that she used to lose significant amount of weight over the summer.  We discussed returning to that diet losing the weight that she has regained and then entering into the maintenance program rather than simply going off her diet F. Her blood pressure is well-controlled on her current regimen we discussed decreasing the hydrochlorothiazide which would have a positive effect on osteoporosis we will change her from the diovan 320 /25 daily to 320/12.5  Review of Systems  Constitutional: Negative for activity change, appetite change and fatigue.  HENT: Negative for ear pain, congestion, neck pain, postnasal drip and sinus pressure.   Eyes: Negative for redness and visual disturbance.  Respiratory: Negative for cough, shortness of breath and wheezing.   Gastrointestinal: Negative for abdominal pain and abdominal distention.  Genitourinary: Negative for dysuria, frequency and menstrual problem.  Musculoskeletal: Negative for myalgias, joint swelling and arthralgias.  Skin: Negative for rash and wound.  Neurological: Negative for dizziness, weakness and headaches.  Hematological: Negative for adenopathy. Does not bruise/bleed easily.  Psychiatric/Behavioral: Positive for sleep disturbance and agitation. Negative for decreased concentration.       Objective:   Physical Exam  Constitutional: She is oriented to person, place, and time. She appears  well-developed and well-nourished.  HENT:  Head: Normocephalic and atraumatic.  Left Ear: External ear normal.  Eyes: Conjunctivae and EOM are normal. Pupils are equal, round, and reactive to light.  Neck: Normal range of motion.  Cardiovascular: Normal rate and regular rhythm.   Pulmonary/Chest: Effort normal and breath sounds normal.  Abdominal: Soft. Bowel sounds are normal.  Musculoskeletal:        Arthritis changes to the hands bilaterally  Neurological: She is alert and oriented to person, place, and time.  Skin: Skin is warm and dry.  Psychiatric: She has a normal mood and affect. Her behavior is normal.          Assessment & Plan:

## 2010-10-28 NOTE — Assessment & Plan Note (Signed)
The patient has osteoarthritic pain in her hands bilaterally she has had a joint replacement as well as injections and has had a history of ganglion cysts she states currently states that her pain is controlled with OTC non-steroidals

## 2010-10-28 NOTE — Assessment & Plan Note (Signed)
Patient's blood pressure is controlled with her use of the event HCT we discussed decreasing her hydrochlorothiazide component from 25-12-1/2 in monitoring her for the next 3 months

## 2010-11-21 ENCOUNTER — Other Ambulatory Visit: Payer: Self-pay | Admitting: *Deleted

## 2010-11-21 MED ORDER — MOMETASONE FUROATE 50 MCG/ACT NA SUSP: 2.0000 | Freq: Every day | NASAL | Status: DC

## 2010-12-09 IMAGING — CR DG CHEST 2V
2 series · 2 of 2 positions shown · non-contrast
Comparison: 09/01/2008

CLINICAL DATA: Cough

CHEST - 2 VIEW

[view not recorded (1 of 2)]
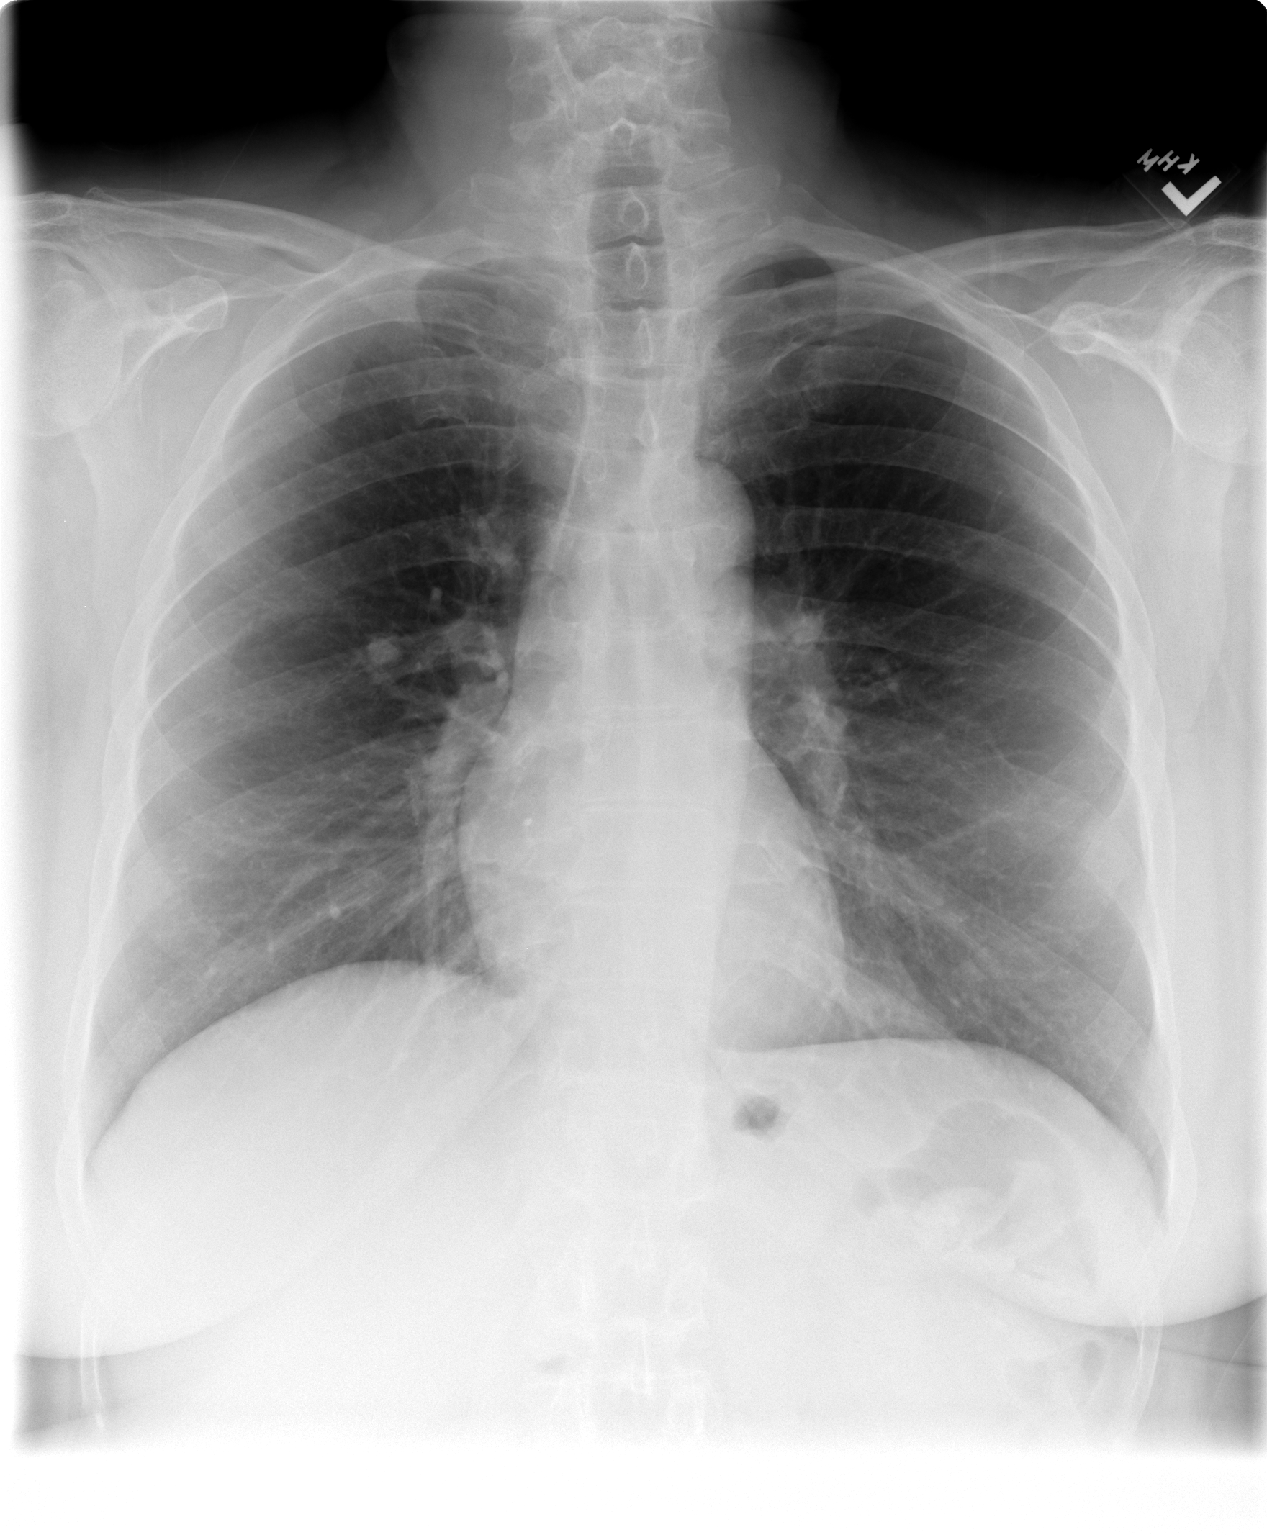

[view not recorded (2 of 2)]
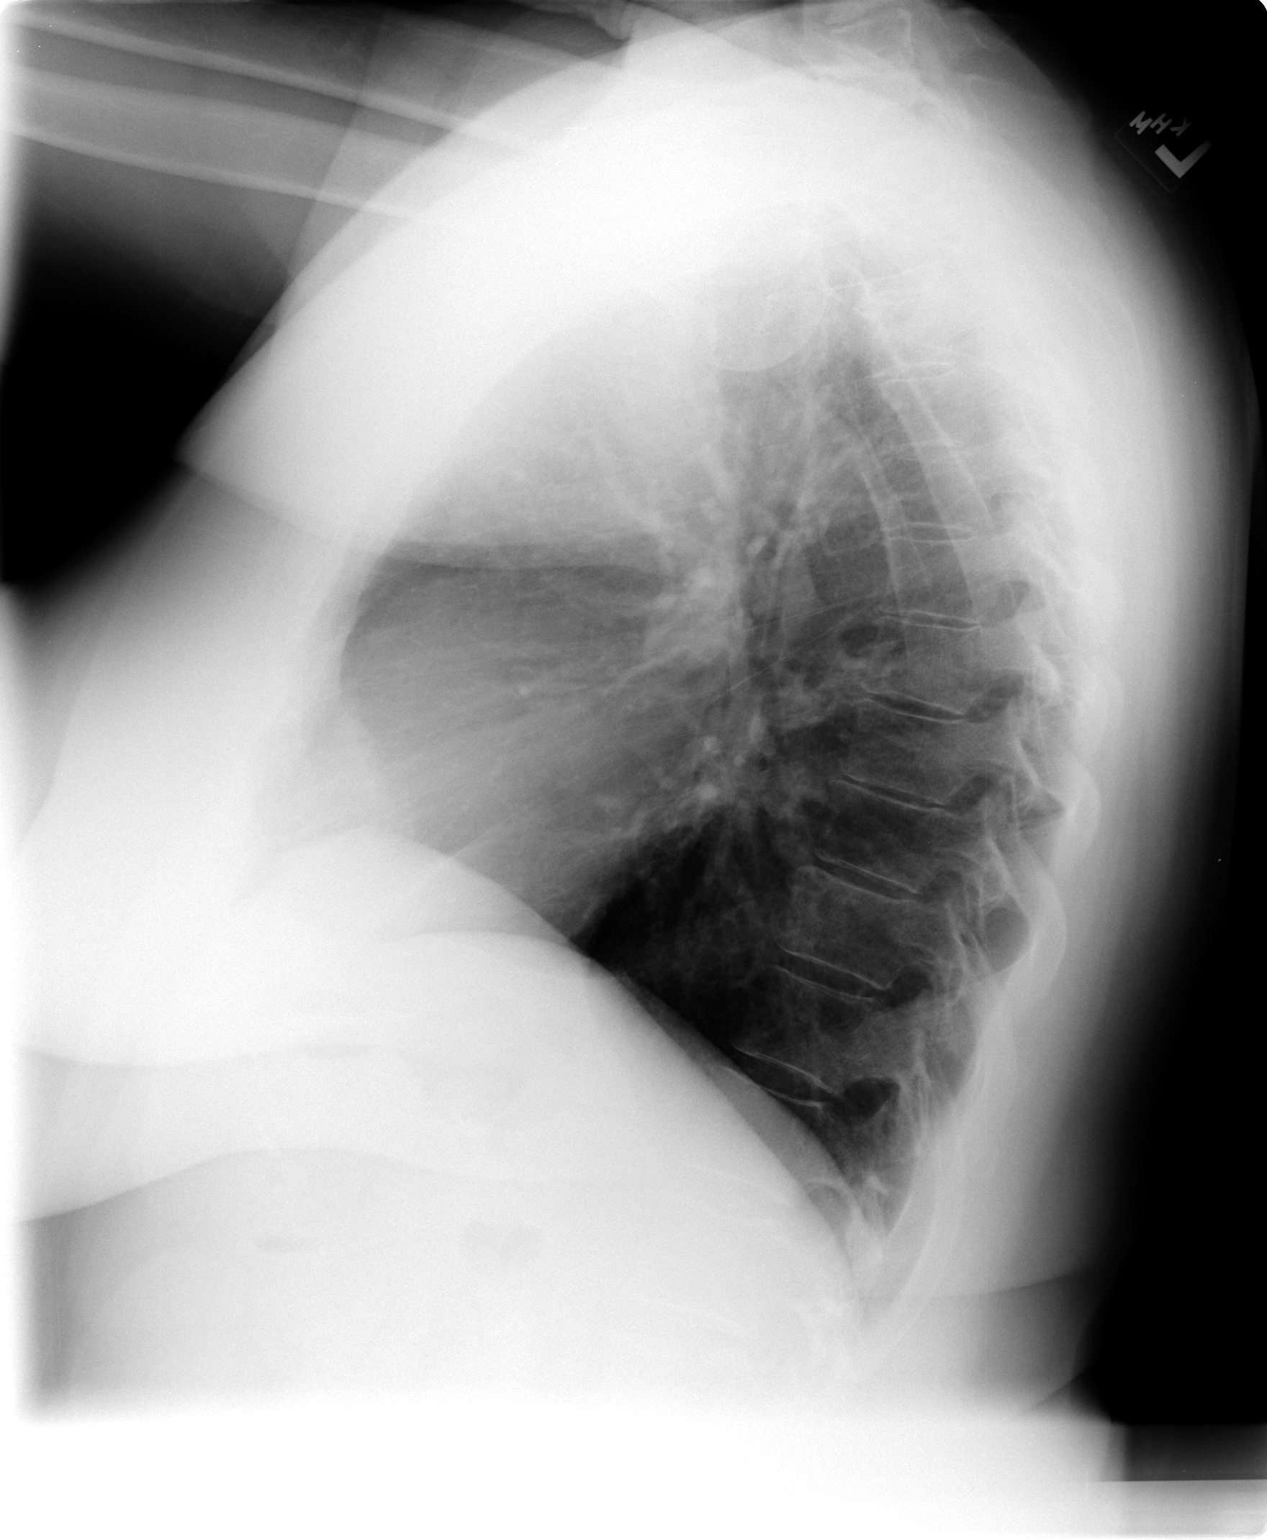

[2 of 2 positions shown; findings below may reference images not displayed]

FINDINGS: Normal heart size, mediastinal contours, and pulmonary vascularity.
Rounded density projects over right perihilar region on PA view, 9
mm diameter, unchanged since 08/25/2002, question calcified
granuloma.
Remaining lungs clear.
No pleural effusion or pneumothorax.
Mild bony demineralization.
IMPRESSION: Probable calcified right mid lung granuloma.
No acute abnormalities.

## 2010-12-17 ENCOUNTER — Other Ambulatory Visit: Payer: Self-pay | Admitting: Internal Medicine

## 2011-01-07 ENCOUNTER — Telehealth: Payer: Self-pay | Admitting: *Deleted

## 2011-01-07 NOTE — Telephone Encounter (Signed)
Pt informed that it is the same medication as per apothecary and advised pt to call apothecary

## 2011-01-07 NOTE — Telephone Encounter (Signed)
Pt wants to speak to [facility] about her last Testosterone that seems really different from any that she has had before.  Also, wonders why she did not have refills.

## 2011-01-28 NOTE — H&P (Signed)
[name], [name]              ACCOUNT NO.:  [id number]   MEDICAL RECORD NO.:  [id number]          PATIENT TYPE:  INP   LOCATION:  IC07                          FACILITY:  [facility]   PHYSICIAN:  Osvaldo Shipper, MD     DATE OF BIRTH:  [birth year]   DATE OF ADMISSION:  08/30/2008  DATE OF DISCHARGE:  LH                              HISTORY & PHYSICAL   PRIMARY CARE PHYSICIAN:  Dr. Darryll Capers with [facility]   ADMITTING DIAGNOSES:  1. Acute pyelonephritis.  2. Sepsis.  3. History of hypertension.  4. History of depression and anxiety disorder.   CHIEF COMPLAINT:  Confusion and fever.   HISTORY OF PRESENT ILLNESS:  The patient is a 69 year old Caucasian  female who presented to the hospital with complaints of confusion.  Patient also mentioned that these symptoms started this morning.  Yesterday, she was fine.  She was unable to find words to communicate  effectively this morning.  She did not have any focal deficits.  She  admits to having high fever, though she did not check her temperature.  She denies any headaches but did have generalized body aches.  She  denies any abdominal pain.  She did have 2 episodes of vomiting with  clear emesis.  She has been having back pain for the past few weeks,  bilateral lower back areas, but denies any difficulty with ambulation  per se.  She admits to having pressure-like sensation with urination,  denies any blood in the urine.  She denies any episodes of UTI recently.  She has been feeling dizzy and lightheaded all day today but has not had  any syncopal episodes.   MEDICATIONS AT HOME:  Benicar/hydrochlorothiazide 40/25 once a day.  She  did take this tablet today.  She is also on Prozac 20 mg daily, and she  is also on Wellbutrin, unknown dose daily.  She take multivitamins.  She  also has been taking cranberry tablets for her urinary symptoms for the  past few days.   ALLERGIES:  No known drug allergies.   PAST MEDICAL  HISTORY:  Positive for hypertension, depression, anxiety  disorder, history of hip replacement surgery in the past, tubal ligation  in the past.  She denies any history of lung disease, heart disease,  diabetes, etc.   SOCIAL HISTORY:  Lives in [place] with her husband, denies any  smoking currently.  She quit this 25 years ago and had smoked up to 2  packs of cigarettes on a daily basis for about 25 years.  No alcohol  consumption.  She is retired from [employer].  Independent with  her daily activities.   FAMILY HISTORY:  Positive for lung cancer, Alzheimer's, diabetes.   REVIEW OF SYSTEMS:  GENERAL:  Positive for weakness, malaise.  HEENT:  Unremarkable.  CARDIOVASCULAR:  Unremarkable.  RESPIRATORY:  Positive  for a cough, nonproductive, for the past 3 months with some wheezing.  GI:  Unremarkable.  GU:  As in HPI.  NEUROLOGICAL:  Unremarkable except  as in HPI.  PSYCHIATRIC:  Unremarkable.  IMMUNOLOGIC:  Unremarkable.  MUSCULOSKELETAL:  Unremarkable.   PHYSICAL EXAMINATION:  VITAL SIGNS:  Temperature 102.3 improving to  98.3, blood pressure when she came in 120/73 with a heart rate of 114.  Blood pressure decreased to 93/47 and then to 85/40s.  Respiratory rate  is about 16, saturation about 97% on 2 L by nasal cannula.  GENERAL:  Overweight white female in no distress, easily arousable and  able to communicate quite easily.  HEENT:  There is no pallor, no icterus, oral mucous membranes dry, no  oral lesions noted.  NECK:  Soft and supple, no thyromegaly appreciated.  LUNGS:  Reveal end-expiratory wheezing bilaterally.  No crackles  appreciated.  No rhonchi heard.  CARDIOVASCULAR:  S1 and S2 normal, regular, no murmurs appreciated, no  S3 or S4, no rubs, no bruits.  ABDOMEN:  Soft, nontender, nondistended.  Bowel sounds are present.  No  masses or organomegaly appreciated.  GU:  Deferred.  BACK:  Examination did not reveal any significant CVA tenderness.   MUSCULOSKELETAL:  Exam was otherwise unremarkable.  NEUROLOGICALLY:  She is drowsy but easily arousable, can communicate  quite well, oriented.  No focal or neurological deficits are present.  Pupils are equal, reacting.  Strength is good bilaterally, equal.  No  cranial nerve deficits are noted.   IMAGING STUDIES:  She had a chest x-ray which showed low lung volumes,  left basilar atelectasis or airspace disease noted on the chest x-ray.   LABORATORY DATA:  Her white count is 26,500 with more than 20% bands,  885 neutrophils, hemoglobin 14.7, platelet count 324, glucose 135, BUN  19, creatinine 0.99.  LFTs are normal.  Albumin is 3.4, lactic acid 129,  calcium 10.1.  UA shows hazy urine, specific gravity 1.02, pH 6, glucose  negative, bilirubin small, ketones negative, moderate blood, protein  noted, positive nitrite, moderate leukocytes, many bacteria, 7-10 WBCs,  3-6 RBCs noted.  Hyaline casts are noted.  Urine cultures have been  sent.  Blood cultures have been sent.   ASSESSMENT:  This is a 70 year old Caucasian female who presents with  generalized body aches, confusion, fever and is found to have evidence  for acute pyelonephritis.  There is also suggestion of an airspace  disease on chest x-ray, and she has been having cough for the past 3  months, so she could have pneumonia as well.  She has a significant  history of smoking, so this could all be bronchitis, especially her lung  findings.   PLAN:  1. Acute pyelonephritis with sepsis.  Blood pressures are running low.      We will aggressively hydrate her with intravenous fluids and      monitor her in the intensive care unit.  Because of the airspace      disease noted on the chest x-ray, I will put her on Levaquin which      should cover pyelonephritis as well.  We will await urine cultures      to narrow the spectrum of antibiotic coverage.  Ultrasound of the      kidneys will also be checked.  Review of an ultrasound  done about 2-      1/2 years ago showed that she had a complex cyst in the left      kidney.  2. Respiratory system.  She has evidence for bronchitis and possible      pneumonia.  We will treat her with Levaquin and inhaler treatment      and will monitor her  closely.  3. History of hypertension.  Obviously, because of the low blood      pressures, we will hold off on her antihypertensive agents at this      time.  4. Deep venous thrombosis prophylaxis will be initiated.  5. Patient will be monitored closely in the ICU.   One hour was spent taking care of this patient.      Osvaldo Shipper, MD  Electronically Signed     GK/MEDQ  D:  08/30/2008  T:  08/30/2008  Job:  [id number]   cc:   Stacie Glaze, MD  [address] [place]  [place] [address]

## 2011-01-28 NOTE — Group Therapy Note (Signed)
[name], [name]              ACCOUNT NO.:  [id number]   MEDICAL RECORD NO.:  [id number]          PATIENT TYPE:  INP   LOCATION:  A312                          FACILITY:  [facility]   PHYSICIAN:  Margaretmary Dys, M.D.DATE OF BIRTH:  Jul 29, 1941   DATE OF PROCEDURE:  09/03/2008  DATE OF DISCHARGE:                                 PROGRESS NOTE   SUBJECTIVE:  The patient feels much better.  She denies any pain.  No  nausea or vomiting.  The patient has no fevers or chills and has really  done very well.  The patient was admitted with acute gastroenteritis  with septic shock.  The patient has not required any more pressors and  she is eating fairly well.  She is sitting up in the bed talking to her  family.   OBJECTIVE:  GENERAL:  Conscious, alert, comfortable, not in acute  distress.  Well-oriented in time, place and person.  VITAL SIGNS:  Blood pressure is 150/70 with a pulse of 68, respiratory  rate is 14, temperature 98.5 degrees Fahrenheit, oxygen saturation was  98% on 2 liters.  HEENT:  Normocephalic, atraumatic.  Oral mucosa was moist with no  exudates.  NECK:  Supple.  No JVD or lymphadenopathy.  LUNGS:  Lungs were clear clinically, good air entry bilaterally.  HEART:  S1-S2 regular.  No S3, S4, gallops or rubs.  ABDOMEN:  Abdomen was soft, nontender.  Bowel sounds positive.  No  masses palpable.  EXTREMITIES:  No pitting pedal edema.  CNS:  Exam was grossly intact with no focal neurological deficits.   LABORATORY/DIAGNOSTIC DATA:  White blood cell count was 16.2, hemoglobin  of 11.3, hematocrit 33.5, platelet count was 258.  Sodium is 139,  potassium 3.4, chloride of 110, CO2 was 23, BUN of 13, creatinine was  0.5, glucose was 121.  Blood cultures revealed E. coli which is  sensitive to levofloxacin which the patient is on.   ASSESSMENT:  1. Acute pyelonephritis.  2. Escherichia coli septic shock.  3. History of acute bronchitis, rule out pneumonia.  4. The patient  has a complex cystic mass upper pole left kidney 3.8 cm      in diameter.   PLAN:  1. The patient has done very well and does not require any      vasopressors and her blood pressures remain stable.  2. Will continue on IV Levaquin for E. coli sepsis/pyelonephritis.  3. Will replace her potassium orally.  4. Will transfer her to 300 floor as the patient is doing very well      and does not really require continued intensive care unit care at      this time.  5. Will continue to hold up blood pressure medications for another day      and see what the trend of blood pressure is before restarting      those.  6. Will continue on Levaquin and also bronchodilators for her what      appear to be bronchitic symptoms.  7. Continue deep venous thrombosis prophylaxis and gastrointestinal      prophylaxis.  DISPOSITION:  Overall the patient is doing better.  The cystic mass on  the left kidney appeared to be stable over the past 2-1/2 years.      Margaretmary Dys, M.D.  Electronically Signed     AM/MEDQ  D:  09/03/2008  T:  09/03/2008  Job:  [id number]

## 2011-01-30 ENCOUNTER — Ambulatory Visit (INDEPENDENT_AMBULATORY_CARE_PROVIDER_SITE_OTHER): Payer: BC Managed Care – PPO | Admitting: Internal Medicine

## 2011-01-30 VITALS — BP 140/80 | HR 76 | Temp 98.8°F | Resp 16 | Ht 64.0 in | Wt 184.0 lb

## 2011-01-30 DIAGNOSIS — F909 Attention-deficit hyperactivity disorder, unspecified type: Secondary | ICD-10-CM

## 2011-01-30 DIAGNOSIS — R0989 Other specified symptoms and signs involving the circulatory and respiratory systems: Secondary | ICD-10-CM

## 2011-01-30 DIAGNOSIS — I10 Essential (primary) hypertension: Secondary | ICD-10-CM

## 2011-01-30 MED ORDER — AMPHETAMINE-DEXTROAMPHETAMINE 10 MG PO TABS: 20.0000 mg | ORAL_TABLET | Freq: Two times a day (BID) | ORAL | Status: DC

## 2011-01-30 MED ORDER — AMPHETAMINE-DEXTROAMPHETAMINE 20 MG PO TABS: 20.0000 mg | ORAL_TABLET | Freq: Two times a day (BID) | ORAL | Status: DC

## 2011-01-31 NOTE — Discharge Summary (Signed)
[name], [name]              ACCOUNT NO.:  [id number]   MEDICAL RECORD NO.:  [id number]          PATIENT TYPE:  INP   LOCATION:  A312                          FACILITY:  [facility]   PHYSICIAN:  Margaretmary Dys, M.D.DATE OF BIRTH:  1941/05/29   DATE OF ADMISSION:  08/30/2008  DATE OF DISCHARGE:  12/22/2009LH                               DISCHARGE SUMMARY   DISCHARGE DIAGNOSES:  1. Acute pyelonephritis.  2. Escherichia coli septic shock.  3. Acute bronchitis with pneumonia.  4. Complex cystic mass of upper pole of the left kidney 3.8 cm in      diameter, this is stable.   DISCHARGE MEDICATIONS:  1. Levaquin 750 mg p.o. daily for 5 more days then stop.  2. Benicar/hydrochlorothiazide 40/25 p.o. once a day.  3. Prozac 20 mg p.o. once a day.  4. Wellbutrin.  5. Multivitamins.  6. Cranberry 4 tablets for urinary tract infection.   Patient is to resume all her other home medications at this time.   DIET:  Heart-healthy diet.   ACTIVITIES:  As tolerated.   OTHER DIAGNOSES OF NOTE:  1. Hypertension.  2. History of depression.  3. History of anxiety disorder.   FOLLOWUP:  Patient is to follow up with her primary care physician, Dr.  Darryll Capers, with [facility].   SPECIAL INSTRUCTIONS:  Patient advised to return to the emergency room  if she develops anymore fevers or lightheadedness.   PERTINENT LABORATORY DATA AT THE TIME OF ADMISSION:  White blood count  was 26,500 with more than 20% bands, hemoglobin 14.7, platelet count was  324, glucose was 135, BUN of 19, creatinine was 0.99.  Liver function  was normal.  Albumin was 3.4.  Lactic acid was 129.  Calcium was 10.1.  Urinalysis showed hazy urine with specific gravity of 1, pH of 6,  glucose was negative, bilirubin was small, small ketones were negative  in her urine but there were many bacteria.  Blood cultures and urine  cultures have confirmed the presence of Escherichia coli.   HOSPITAL COURSE:  Patient  is a 70 year old female who presented to the  emergency room with complaints of confusion.  Patient reports doing  fairly well the day before admission.  On the day of admission, she was  unable to find words and communicate effectively.  She denied any focal  deficits.  On evaluation, patient was found to have a high fever of 102  degrees.  She denies any headache.  She has had 2 episodes of vomiting  mostly clear emesis.  Patient reports having some dysuria and also  significant colored urine.  Patient has been dizzy and feeling  lightheaded all day prior to admission.  She has had no syncopal  episodes.  Evaluation in the emergency room revealed her temperature was  102.3, blood pressure was 120/73, however, this decreased to 85/40  during the course of evaluation.  Her respiratory rate was 69.  Oxygen  saturation was 97% on 2 L.  She was easily arousable although lethargic  in the emergency room.  She was drowsy.   She had  a chest x-ray which showed some airspace disease in the left  basilar area.   Due to the patient's hypertension, significantly elevated temperature,  and white blood cell count, a diagnosis of septic shock was made likely  due to acute pyelonephritis.  Patient was subsequently admitted to the  intensive care unit and aggressive fluid hydration was begun.  Patient  was then placed on Levaquin antibiotic.  Ultrasound of kidneys were  checked which were unremarkable other than a complex cyst in the left  kidney which was stable from 3-1/2 years ago.   Patient received fluid hydration and improved gradually.  I admit  patient on September 03, 2008.  Patient was doing much better.  Her blood  pressure had improved to 150/70.  Her temperature has also resolved.  Her white blood cell count was now down to 16,200.   I transferred her out of intensive care unit for further observation on  the medical floor.  Patient is improved and had physical therapy confirm  the  patient was fully mobile without any risks of fall.  Patient was  then discharged home in satisfactory condition.   DISPOSITION:  To home.      Margaretmary Dys, M.D.  Electronically Signed     AM/MEDQ  D:  10/10/2008  T:  10/10/2008  Job:  [id number]

## 2011-01-31 NOTE — Op Note (Signed)
[name], [name]              ACCOUNT NO.:  [id number]   MEDICAL RECORD NO.:  [id number]          PATIENT TYPE:  AMB   LOCATION:  DAY                           FACILITY:  [facility]   PHYSICIAN:  Lionel December, M.D.    DATE OF BIRTH:  06-Mar-1941   DATE OF PROCEDURE:  12/23/2004  DATE OF DISCHARGE:                                 OPERATIVE REPORT   PROCEDURE:  Colonoscopy.   INDICATIONS:  [name] is a 70 year old Caucasian female who is here for  screening colonoscopy. She is status post replacement of one of the smaller  joints in her right mid finger and was given SBE prophylaxis.   Procedure risks were reviewed with the patient and informed consent was  obtained.   PREMEDICATION:  Demerol 25 mg IV ,Versed 4 mg IV in divided dose.   FINDINGS:  Procedure performed in endoscopy suite. The patient's vital signs  and O2 saturation were monitored during procedure and remained stable. The  patient was placed in left lateral position. Rectal examination performed.  No abnormality noted on external or digital exam. Olympus videoscope was  placed in rectum and advanced under vision into sigmoid colon and beyond.  Preparation was satisfactory. She had multiple small diverticula at sigmoid  colon. Scope was passed into cecum which was identified by appendiceal  orifice and ileocecal valve. Pictures taken for the record. As the scope was  withdrawn, colonic mucosa was once again carefully examined. There were no  polyps or tumor masses. There was mucopurulent fluid covering one of the  diverticula at distal sigmoid colon. Mucosa was erythematous in peristomal  area. These changes felt to be typical of sigmoid diverticulitis. Mucosa of  rest of the sigmoid colon and rectum was normal. Scope was retroflexed to  examine the anorectal junction and small hemorrhoids were noted below the  dentate line. Endoscope was straightened and withdrawn. The patient  tolerated the procedure well.   FINAL  DIAGNOSES:  1.  Examination performed to cecum. No evidence of polyps.  2.  Sigmoid colon diverticulosis along with incidental finding of sigmoid      diverticulitis.   RECOMMENDATIONS:  1.  Cipro 500 mg p.o. b.i.d. and metronidazole 500 mg p.o. b.i.d., both for      10 days.  2.  She needs to be a high-fiber diet which she can start later this week      and also take Citrucel 1 tablespoonful daily or equivalent.  3.  She should continue yearly Hemoccults and consider next screening exam      in 10 years from now.      NR/MEDQ  D:  12/23/2004  T:  12/23/2004  Job:  [id number]   cc:   Stacie Glaze, M.D. [facility]

## 2011-01-31 NOTE — Op Note (Signed)
NAME:  DARRIAN, GOODWILL                        ACCOUNT NO.:  [id number]   MEDICAL RECORD NO.:  [id number]                   PATIENT TYPE:  AMB   LOCATION:  DSC                                  FACILITY:  [facility]   PHYSICIAN:  Artist Pais. Mina Marble, M.D.           DATE OF BIRTH:  10/15/1940   DATE OF PROCEDURE:  03/22/2003  DATE OF DISCHARGE:                                 OPERATIVE REPORT   PREOPERATIVE DIAGNOSES:  1. 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  2. Right long finger mass dorsally.   POSTOPERATIVE DIAGNOSES:  1. 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  2. Right long finger mass dorsally.   PROCEDURES:  1. Right index finger DIP fusion using AccuTrac DIP fusion device, 27 mm.  2. Excisional biopsy of mass, long finger dorsally, right hand.   SURGEON:  Dr. Mina Marble.   ASSISTANT:  Aura Fey. Bobbe Medico.   ANESTHESIA:  General.   TOURNIQUET TIME:  45 minutes.   COMPLICATIONS:  No complications.   DRAINS:  No drains.   OPERATIVE REPORT:  The patient was taken to the operating room.  After the  induction of adequate general anesthesia, right upper extremity was prepped  and draped in the usual sterile fashion.  An Esmarch was used to  exsanguinate the limb.  The tourniquet was inflated to 250 mmHg.  At this  point in time, a longitudinal incision was made in the midline, over the  proximal phalanx of the long finger, right hand.  Incision was taken down  through the skin and subcutaneous tissue, and a large, synovial-type cyst  was encountered, overlying the proximal phalanx of the right long finger.  Dissection was carried down to the extensor mechanism; the cyst and  surrounding tissues were dissected free from the underlying extensor  mechanism and sent for pathologic confirmation/diagnosis.  The wound was  irrigated, hemostasis was achieved with bipolar cautery and was loosely  closed with 5-0 nylon.   A second incision was then made over the index finger, in an L-shaped  fashion, and a large, ulnar-based flap was elevated.  Dissection was carried  down to the extensor mechanism.  The extensor mechanism was transected over  the distal interphalangeal joint.  The distal interphalangeal joint was  decorticated of the remaining aspects of articular cartilage that was  present until cancellous bone was evident on both the distal aspect of the  middle phalanx and the proximal aspect of distal phalanx.  Once this was  done using the AccuTrac fusion device, a 27-mm AccuTrac fusion screw was  placed across the DIP joint in slight flexion and slight pronation to aid in  pinch.  The wound was then thoroughly irrigated.  The extensor mechanism was  closed with 4-0 Vicryl and the skin with 5-0  nylon.  Sterile dressing with Xeroform and ______ Plus and  a Coban wrap were  applied to both fingers.  The DIP joint at the index finger was splinted  volarly.  Patient had Marcaine injected postop for pain control.  Patient  tolerated the procedure well and went to recovery in stable fashion.                                               Artist Pais Mina Marble, M.D.    MAW/MEDQ  D:  03/22/2003  T:  03/22/2003  Job:  [id number]

## 2011-01-31 NOTE — Op Note (Signed)
NAME:  [name], [name]                        ACCOUNT NO.:  [id number]   MEDICAL RECORD NO.:  [id number]                   PATIENT TYPE:  AMB   LOCATION:  DSC                                  FACILITY:  [facility]   PHYSICIAN:  Artist Pais. Mina Marble, M.D.           DATE OF BIRTH:  28-Jun-1941   DATE OF PROCEDURE:  05/22/2004  DATE OF DISCHARGE:                                 OPERATIVE REPORT   PREOPERATIVE DIAGNOSES:  Right long finger proximal interphalangeal joint  dislocation status post prosthetic joint replacement one week ago.   POSTOPERATIVE DIAGNOSES:  Right long finger proximal interphalangeal joint  dislocation status post prosthetic joint replacement one week ago.   PROCEDURE:  Closed reduction, proximal interphalangeal dislocation, right  long proximal phalangeal joint.   SURGEON:  Artist Pais. Mina Marble, M.D.   ASSISTANT:  Aura Fey. Bobbe Medico.   ANESTHESIA:  General.   TOURNIQUET TIME:  None.   COMPLICATIONS:  None.   DRAINS:  None.   DESCRIPTION OF PROCEDURE:  The patient was taken to the operating room.  After the induction of adequate general anesthesia, the right hand was  gently manipulated, first in longitudinal retraction, then extension of the  PIP joint.  Reduction was performed.  Intraoperative x-rays showed good  reduction in both the AP and lateral planes.  The patient was placed in a  Alumafoam splint wit the MP slightly flexed and the PIP extended, the DIP  extended.  Intraoperative x-rays showed good reduction both AP and lateral  view.  The patient tolerated the procedure well and went to recovery in  stable fashion.                                               Artist Pais Mina Marble, M.D.    MAW/MEDQ  D:  05/22/2004  T:  05/22/2004  Job:  [id number]

## 2011-01-31 NOTE — Op Note (Signed)
NAME:  [name], [name]                        ACCOUNT NO.:  [id number]   MEDICAL RECORD NO.:  [id number]                   PATIENT TYPE:  INP   LOCATION:  5041                                 FACILITY:  [facility]   PHYSICIAN:  Elana Alm. Thurston Hole, M.D.              DATE OF BIRTH:  25-Apr-1941   DATE OF PROCEDURE:  08/31/2002  DATE OF DISCHARGE:                                 OPERATIVE REPORT   PREOPERATIVE DIAGNOSES:  1. Left hip degenerative joint disease.  2. Right third finger dorsal ganglion cyst.   POSTOPERATIVE DIAGNOSES:  1. Left hip degenerative joint disease.  2. Right third finger dorsal ganglion cyst.   PROCEDURE:  1. Left total hip replacement using Osteonix stable hip system with     acetabulum #52 mm Press-Fit TSL acetabulum with two locking screws and 10     degree polyethylene liner. Femoral component #9, cemented, Secure-Fit     Plus component with -3 x 28 mm femoral head with 13 mm distal tip.  2. Right third finger dorsal ganglion cyst excision.   SURGEON:  Elana Alm. Thurston Hole, M.D.   ASSISTANT:  Candace Cruise. Minor, PA   ANESTHESIA:  General.   OPERATIVE TIME:  1 hour and 50 minutes.   COMPLICATIONS:  None.   DESCRIPTION OF PROCEDURE:  The patient was brought to the operating room on  August 31, 2002, and placed on the operating table in the supine position.  After an adequate level of general anesthesia was obtained her left hip was  examined under anesthesia. She had approximately 1.5 cm of shortening of the  left leg compared to the right. She had flexion to 90, extension to 0 and  internal and external rotation of 30 degrees. She then had a Foley catheter  placed under sterile conditions and received Ancef 1 gm IV preoperatively  for prophylaxis.   She was then turned to the left lateral up decubitus position and secured on  the bed with a Mark frame. Her left hip and leg were prepped using sterile  Duraprep and draped using sterile technique.   Originally through a 20 cm posterolateral greater trochanteric incision,  initial exposure was made. The underlying subcutaneous tissues were incised  while in skin incision. The iliotibial band and gluteus maximus fascia was  incised longitudinally, revealing the underlying sciatic nerve which was  carefully protected. The short external rotators of the hip and hip capsule  were released off their femoral neck insertions intact. The hip was then  posteriorly dislocated. She was noted to have significant grade 3 and 4  chondromalacia of the femoral head.   A femoral neck cut was then made 1.5 cm above the lesser trochanter with an  appropriate amount of anteversion and inclination. Carefully placed  retractors were then placed around the acetabulum. After this was done the  acetabulum was exposed. She was found to have grade 3 and 4 chondromalacia  there as well. Degenerative acetabular labrum was then removed.   Sequentially then acetabular reamers were used to ream to a 52 mm size and  then a 52 mm trial  was placed and hammered into position with an excellent  fit. It was then removed and the actual 52 mm PSO cup was hammered into  position in the appropriate amount of anteversion and abduction. It was  further secured in place with two separate 20 mm locking screws, one in the  11 and one in the 12 o'clock position. A 10 degree polyethylene liner was  then placed with the 10 degree lip in the posterosuperior position.   After this was done the proximal femur was exposed.  Sequential axial  reamers were used to ream up to a #9 size, followed by broaches to a #9 size  with a #9 broach in place. A posterior about 20 mm femoral head and neck  trial was placed and the hip reduced. There was slight lengthening,  increased length of the leg, and thus another trial of a -3 x 28 mm femoral  head was used. This was found to give excellent restoration of normal leg  length and excellent  stability up to 70 degrees of internal rotation in both  neutral and 30 degrees of adduction, and also stable in adduction and  external rotation.   This was then dislocated.  Sequential distal reaming was then used up to a  13.5 mm size. At this point the femoral canal was irrigated and then the #9  Secure-Fit Plus prosthesis with a 13 mm distal tip was hammered into  position with an excellent press fit. A  -3 x 20 mm femoral head was hammered onto the femoral neck with an excellent  Morse taper fit. The hip was then reduced and taken through a full range of  motion and found to be stable and leg length equal.   At this point it was felt that all the components were of excellent size,  fit and stability. The wound was further irrigated with antibiotic solution.  The hip capsule was then closed with #1 Ethibond suture, reattaching it to  the femoral neck through two drill holes in the greater trochanter. The  iliotibial band and gluteus maximus fascia were closed with #1 Ethibond  suture. The subcutaneous tissue was closed with a 0 and 2-0 Vicryl. The skin  was closed with skin staples. Sterile dressings  were then applied.   The patient was  turned in the supine position and checked for her leg  lengths which were equal. Rotation was equal. Neurovascular status was  normal. A hip abduction  pillow was placed.   At this point attention was turned to her right hand, where she had a third  dorsal finger proximal interphalangeal joint cyst. The right hand and arm  were prepped using sterile Duraprep and draped using sterile technique. The  arm was exsanguinated and a forearm tourniquet was elevated to 200 mm.   Initially through a transverse incision based over the cyst, initial  exposure was made. The underlying subcutaneous tissues were incised along  with the skin incision. The cyst was filled with gelatinous, ganglion type fluid. The cyst was thoroughly removed. It was found to be  contiguous with  the extensor tendon and was carefully teased off of this and removed. It was  not sent for pathologic review due to the obvious benign nature of it. The  underlying extensor tendon was normal.  After this was done the wound was irrigated and closed with interrupted 4-0  nylon mattress sutures. Sterile dressings were applied and the tourniquet  was released and then the patient was  awakened and  transferred to the  recovery room in stable condition. All sponge and needle counts were correct  x 2 at the end of the case.                                               Robert A. Thurston Hole, M.D.   RAW/MEDQ  D:  08/31/2002  T:  08/31/2002  Job:  [id number]

## 2011-01-31 NOTE — Op Note (Signed)
NAME:  [name], [name]                        ACCOUNT NO.:  [id number]   MEDICAL RECORD NO.:  [id number]                   PATIENT TYPE:  AMB   LOCATION:  DSC                                  FACILITY:  [facility]   PHYSICIAN:  Artist Pais. Mina Marble, M.D.           DATE OF BIRTH:  1941/06/04   DATE OF PROCEDURE:  05/15/2004  DATE OF DISCHARGE:                                 OPERATIVE REPORT   REFERRING PHYSICIAN:  Loreta Ave, M.D.   PREOPERATIVE DIAGNOSIS:  Right long finger proximal interphalangeal joint,  degenerative joint arthritis.   POSTOPERATIVE DIAGNOSIS:  Right long finger proximal interphalangeal joint,  degenerative joint arthritis.   PROCEDURE:  Right long finger proximal interphalangeal joint arthroplasty  using [employer] PyroCarbon implant 20 proximal, 10 distal.   SURGEON:  Matthew A. Mina Marble, M.D., and Cindee Salt, M.D.   ASSISTANT:  Aura Fey. Bobbe Medico.   ANESTHESIA:  General anesthesia.   TOURNIQUET TIME:  One hour and 10 minutes.   COMPLICATIONS:  None.   DRAINS:  None.   DESCRIPTION OF PROCEDURE:  The patient was taken to the operating room after  the induction of adequate general anesthesia.  The right upper extremity was  prepped and draped in the usual sterile fashion.  An esmarch was used to  exsanguinate the limb.  Tourniquet inflated to 250 mmHg.  At this point in  time, a C-shaped incision was made over the proximal middle phalangeal joint  of the right long finger and a large radially based flap was elevated.  A  large degenerative cyst was encountered just under the skin.  This was  excised and drained.  At this point in time, the extension mechanism was  identified and a Chamay approach to the proximal middle phalangeal joint was  undertaken with a large distally based flap where the extensor mechanism was  created.  This was retracted, exposing the PIP joint.  Large dorsal  osteophytes were removed.  Next, using the intramedullary finder,  the aul  was used to find the intramedullary canal of the proximal phalanx followed  by the cutting guide and a vertical cut.  This was then followed by  placement of the oblique cutting guide and the oblique cut.  Dorsal and  volar osteophytes were removed with a rongeur.  At this point in time, an  0.35 K-wire was driven from dorsal to volar, the insertion of the central  slip to protect the central slip insertion and the intramedullary canal of  the proximal aspect of the middle phalanx was identified and using a  flattening bur, the surface was burred down to a flat area.  The trial was  then placed after appropriate broaching.  The trials were 20 proximal, 10  distal.  This gave adequate tension and congruity of the joint both the AP  and lateral plan both on physical examination and fluoroscopy.  At this  point in time, the  wound was thoroughly irrigated.  The components were  placed.  The 20 proximal PyroCarbon and 10 distal PyroCarbon.  The extensor  mechanism was repaired with 4-0 [place] and the radial side was tightened  as  well to insure soft tissue balancing.  Skin was then closed with 5-0 nylon.  Sterile dressing with Xeroform, 4x4, fluffs and a splint with the MP joint  flexed to 20 and the PIP and DIP extended with quad.  The patient tolerated  the procedure well and went to the recovery room in stable condition.                                               Artist Pais Mina Marble, M.D.    MAW/MEDQ  D:  05/15/2004  T:  05/15/2004  Job:  [id number]   cc:   Loreta Ave, M.D.  [address]  [place] [address]  Fax: [phone]

## 2011-01-31 NOTE — Discharge Summary (Signed)
   NAME:  ARZELLA, REHMANN                        ACCOUNT NO.:  [id number]   MEDICAL RECORD NO.:  [id number]                   PATIENT TYPE:  INP   LOCATION:  5041                                 FACILITY:  [facility]   PHYSICIAN:  Elana Alm. Thurston Hole, M.D.              DATE OF BIRTH:  02-19-1941   DATE OF ADMISSION:  08/31/2002  DATE OF DISCHARGE:  09/03/2002                                 DISCHARGE SUMMARY   ADMITTING DIAGNOSES:  1. Left hip degenerative joint disease.  2. Hypertension.  3. Depression.  4. Sleep disorder.   DISCHARGE DIAGNOSES:  1. Left hip degenerative joint disease.  2. Hypertension.  3. Depression.  4. Sleep disorder.  5. Postoperative blood loss anemia.   HISTORY OF PRESENT ILLNESS:  The patient is a 70 year old female with a  history of left hip pain x2 years, getting progressively worse over the past  five months.  She has tried conservative treatment without success.  She  understands the risks, benefits, and possible complications for a left total  hip replacement, and is without questions.   PROCEDURES IN HOUSE:  On 08/31/02, the patient underwent a left total hip  replacement by Dr. Thurston Hole, as well as excision of a ganglion cyst on her  right hand.  She tolerated the procedures well.   On postoperative day #1, the left hip wound was well approximated, clean,  and dry.  Left hand wound and also well approximated.  No excess drainage.  Her hemoglobin was 11.7, INR was 1.0.  She was mobilized with physical  therapy, evaluated by rehab.  She did not appear to be a rehab candidate.   On postoperative day #2, INR was 1.2, potassium 3.0, hemoglobin 11.4,  potassium supplementation was given for hypokalemia.  Dressings were  changed.   On postoperative day #3, the patient was afebrile.  She was neurovascularly  intact.  Her labs were stable.  She was discharged to home in stable  condition with home health physical therapy.  She was ambulating 120 feet  under close supervision.   DISCHARGE MEDICATIONS:  1. Percocet 5/325, 1-2 q.4-6h. p.r.n. pain.  2. Coumadin 5 mg one tablet daily.  3. Lovenox 30 mg subcutaneous at 10 p.m. tonight.   DISCHARGE INSTRUCTIONS:  1. She is touchdown weightbearing.  2. She has been instructed to call with a temperature greater than 101,     increased redness, increased drainage.     Kirstin Shepperson, P.A.                  Robert A. Thurston Hole, M.D.    KS/MEDQ  D:  11/30/2002  T:  12/02/2002  Job:  [id number]

## 2011-02-07 ENCOUNTER — Other Ambulatory Visit: Payer: Self-pay | Admitting: Internal Medicine

## 2011-02-07 DIAGNOSIS — R0989 Other specified symptoms and signs involving the circulatory and respiratory systems: Secondary | ICD-10-CM

## 2011-02-11 ENCOUNTER — Other Ambulatory Visit: Payer: Self-pay | Admitting: *Deleted

## 2011-02-11 ENCOUNTER — Encounter: Payer: BC Managed Care – PPO | Admitting: *Deleted

## 2011-02-11 DIAGNOSIS — R0989 Other specified symptoms and signs involving the circulatory and respiratory systems: Secondary | ICD-10-CM

## 2011-02-26 ENCOUNTER — Other Ambulatory Visit: Payer: Self-pay | Admitting: *Deleted

## 2011-02-26 MED ORDER — FLUOXETINE HCL 20 MG PO CAPS: 20.0000 mg | ORAL_CAPSULE | Freq: Every day | ORAL | Status: DC

## 2011-02-26 MED ORDER — BUPROPION HCL ER (XL) 300 MG PO TB24: 300.0000 mg | ORAL_TABLET | Freq: Every day | ORAL | Status: DC

## 2011-02-27 ENCOUNTER — Other Ambulatory Visit: Payer: Self-pay | Admitting: Internal Medicine

## 2011-02-27 DIAGNOSIS — R0989 Other specified symptoms and signs involving the circulatory and respiratory systems: Secondary | ICD-10-CM

## 2011-02-28 ENCOUNTER — Ambulatory Visit (INDEPENDENT_AMBULATORY_CARE_PROVIDER_SITE_OTHER): Payer: BC Managed Care – PPO | Admitting: *Deleted

## 2011-02-28 DIAGNOSIS — I6529 Occlusion and stenosis of unspecified carotid artery: Secondary | ICD-10-CM

## 2011-02-28 DIAGNOSIS — R0989 Other specified symptoms and signs involving the circulatory and respiratory systems: Secondary | ICD-10-CM

## 2011-03-03 ENCOUNTER — Encounter: Payer: Self-pay | Admitting: Internal Medicine

## 2011-04-17 ENCOUNTER — Ambulatory Visit (INDEPENDENT_AMBULATORY_CARE_PROVIDER_SITE_OTHER): Payer: BC Managed Care – PPO | Admitting: Internal Medicine

## 2011-04-17 ENCOUNTER — Encounter: Payer: Self-pay | Admitting: Internal Medicine

## 2011-04-17 VITALS — BP 124/80 | HR 72 | Temp 98.2°F | Resp 16 | Ht 64.0 in | Wt 185.0 lb

## 2011-04-17 DIAGNOSIS — R635 Abnormal weight gain: Secondary | ICD-10-CM

## 2011-04-17 DIAGNOSIS — I10 Essential (primary) hypertension: Secondary | ICD-10-CM

## 2011-04-17 DIAGNOSIS — H9190 Unspecified hearing loss, unspecified ear: Secondary | ICD-10-CM

## 2011-04-17 LAB — BASIC METABOLIC PANEL
BUN: 26 mg/dL — ABNORMAL HIGH (ref 6–23)
CO2: 28 mEq/L (ref 19–32)
Chloride: 100 mEq/L (ref 96–112)
Creatinine, Ser: 1 mg/dL (ref 0.4–1.2)
Glucose, Bld: 98 mg/dL (ref 70–99)

## 2011-04-17 LAB — POCT UA - GLUCOSE/PROTEIN: Glucose, UA: NEGATIVE

## 2011-04-17 MED ORDER — VALSARTAN-HYDROCHLOROTHIAZIDE 320-25 MG PO TABS: 1.0000 | ORAL_TABLET | Freq: Every day | ORAL | Status: DC

## 2011-04-17 MED ORDER — AMPHETAMINE-DEXTROAMPHETAMINE 20 MG PO TABS: 20.0000 mg | ORAL_TABLET | Freq: Two times a day (BID) | ORAL | Status: DC

## 2011-04-17 NOTE — Progress Notes (Signed)
Subjective:    Patient ID: [name], female    DOB: April 14, 1941, 70 y.o.   MRN: [id number]  HPI  Monitoring of weight gain and its impact on her hypertension hyperlipidemia and gastroesophageal reflux.  As well as treatment of a mild mood disorder with body image disorder inability to follow appropriate diet do to the fact that when she begins overeating she continues overeating in a binge pattern.    Review of Systems  Constitutional: Negative for activity change, appetite change and fatigue.  HENT: Negative for ear pain, congestion, neck pain, postnasal drip and sinus pressure.   Eyes: Negative for redness and visual disturbance.  Respiratory: Negative for cough, shortness of breath and wheezing.   Gastrointestinal: Negative for abdominal pain and abdominal distention.  Genitourinary: Negative for dysuria, frequency and menstrual problem.  Musculoskeletal: Negative for myalgias, joint swelling and arthralgias.  Skin: Negative for rash and wound.  Neurological: Negative for dizziness, weakness and headaches.  Hematological: Negative for adenopathy. Does not bruise/bleed easily.  Psychiatric/Behavioral: Negative for sleep disturbance and decreased concentration.   Past Medical History  Diagnosis Date  . HYPERLIPIDEMIA NEC/NOS 04/19/2007  . HYPERTENSION 04/07/2007  . ALLERGIC RHINITIS 11/30/2008  . ASTHMA UNSPECIFIED WITH EXACERBATION 02/17/2008  . Esophageal reflux 11/08/2008  . OSTEOARTHRITIS 04/07/2007  . OSTEOPENIA 03/10/2008  . INSOMNIA 08/07/2010  . CAROTID BRUIT, LEFT 04/19/2007  . CLOSED FRACTURE OF METATARSAL BONE 06/04/2009  . ALLERGIC RHINITIS 11/30/2008  . Closed fracture of lateral malleolus 06/04/2009  . GANGLION CYST 04/19/2007  . UTI 07/20/2009   Past Surgical History  Procedure Date  . Tubal ligation   . Total hip arthroplasty   . Knuckle replacement     reports that she has never smoked. She has never used smokeless tobacco. She reports that she drinks alcohol.  She reports that she does not use illicit drugs. family history includes Alzheimer's disease in her sister; COPD in her father; Cancer in her brother; and Heart disease in her mother. No Known Allergies     Objective:   Physical Exam  Nursing note and vitals reviewed. Constitutional: She is oriented to person, place, and time. She appears well-developed and well-nourished. No distress.  HENT:  Head: Normocephalic and atraumatic.  Right Ear: External ear normal.  Left Ear: External ear normal.  Nose: Nose normal.  Mouth/Throat: Oropharynx is clear and moist.  Eyes: Conjunctivae and EOM are normal. Pupils are equal, round, and reactive to light.  Neck: Normal range of motion. Neck supple. No JVD present. No tracheal deviation present. No thyromegaly present.  Cardiovascular: Normal rate, regular rhythm, normal heart sounds and intact distal pulses.   No murmur heard. Pulmonary/Chest: Effort normal and breath sounds normal. She has no wheezes. She exhibits no tenderness.  Abdominal: Soft. Bowel sounds are normal.  Musculoskeletal: Normal range of motion. She exhibits no edema and no tenderness.  Lymphadenopathy:    She has no cervical adenopathy.  Neurological: She is alert and oriented to person, place, and time. She has normal reflexes. No cranial nerve deficit.  Skin: Skin is warm and dry. She is not diaphoretic.  Psychiatric: She has a normal mood and affect. Her behavior is normal.          Assessment & Plan:  We spent 30 minutes face-to-face counseling the patient about her diet and exercise her need to approach her exercise and diet not as an all or nothing that as a daily plan for exercise and for prevention of overeating.  We  discussed planned snacks and appropriate portion control as part of her weight loss regimen.  Her blood pressure stable on current medications no changes are indicated her lipids should be monitored at her next visit which will be a Medicare wellness  examination at that time she is due a Pap smear and an EKG.  Otherwise her gastroesophageal reflux is also stable at this time

## 2011-04-20 LAB — URINE CULTURE: Colony Count: >=100000

## 2011-04-21 ENCOUNTER — Other Ambulatory Visit: Payer: Self-pay | Admitting: Internal Medicine

## 2011-04-21 MED ORDER — NITROFURANTOIN MONOHYD MACRO 100 MG PO CAPS: 100.0000 mg | ORAL_CAPSULE | Freq: Two times a day (BID) | ORAL | Status: AC

## 2011-05-13 ENCOUNTER — Ambulatory Visit (INDEPENDENT_AMBULATORY_CARE_PROVIDER_SITE_OTHER): Payer: BC Managed Care – PPO | Admitting: Internal Medicine

## 2011-05-13 VITALS — BP 136/80 | HR 76 | Temp 98.2°F | Resp 16 | Wt 182.0 lb

## 2011-05-13 DIAGNOSIS — M1990 Unspecified osteoarthritis, unspecified site: Secondary | ICD-10-CM

## 2011-05-13 DIAGNOSIS — N390 Urinary tract infection, site not specified: Secondary | ICD-10-CM

## 2011-05-13 DIAGNOSIS — R635 Abnormal weight gain: Secondary | ICD-10-CM

## 2011-05-13 DIAGNOSIS — M6740 Ganglion, unspecified site: Secondary | ICD-10-CM

## 2011-05-13 DIAGNOSIS — R300 Dysuria: Secondary | ICD-10-CM

## 2011-05-13 LAB — POCT URINALYSIS DIPSTICK
Glucose, UA: NEGATIVE
Ketones, UA: NEGATIVE
Spec Grav, UA: 1.015

## 2011-05-13 MED ORDER — NITROFURANTOIN MONOHYD MACRO 100 MG PO CAPS: 100.0000 mg | ORAL_CAPSULE | Freq: Two times a day (BID) | ORAL | Status: AC

## 2011-05-14 ENCOUNTER — Encounter: Payer: Self-pay | Admitting: Internal Medicine

## 2011-05-14 NOTE — Progress Notes (Signed)
Subjective:    Patient ID: [name], female    DOB: December 25, 1940, 70 y.o.   MRN: [id number]  HPI  Patient is a 70 year old white female with a history of osteoarthritis who presents with a ganglion cyst of the third digit of the right hand.  She also has noted some increased Heberden nodes in her hands with worsening osteoarthritic pain.  The ganglion cyst has been increasing in size and was aspirated once before.  The patient also has some chronic dysuria is intermittent she was treated last office visit. She is afebrile without back pain    Review of Systems  Constitutional: Negative for activity change, appetite change and fatigue.  HENT: Negative for ear pain, congestion, neck pain, postnasal drip and sinus pressure.   Eyes: Negative for redness and visual disturbance.  Respiratory: Negative for cough, shortness of breath and wheezing.   Gastrointestinal: Negative for abdominal pain and abdominal distention.  Genitourinary: Negative for dysuria, frequency and menstrual problem.  Musculoskeletal: Positive for joint swelling and arthralgias. Negative for myalgias.  Skin: Negative for rash and wound.  Neurological: Negative for dizziness, weakness and headaches.  Hematological: Negative for adenopathy. Does not bruise/bleed easily.  Psychiatric/Behavioral: Negative for sleep disturbance and decreased concentration.       Objective:   Physical Exam  Constitutional: She is oriented to person, place, and time. She appears well-developed and well-nourished. No distress.  HENT:  Head: Normocephalic and atraumatic.  Right Ear: External ear normal.  Left Ear: External ear normal.  Nose: Nose normal.  Mouth/Throat: Oropharynx is clear and moist.  Eyes: Conjunctivae and EOM are normal. Pupils are equal, round, and reactive to light.  Neck: Normal range of motion. Neck supple. No JVD present. No tracheal deviation present. No thyromegaly present.  Cardiovascular: Normal rate,  regular rhythm, normal heart sounds and intact distal pulses.   No murmur heard. Pulmonary/Chest: Effort normal and breath sounds normal. She has no wheezes. She exhibits no tenderness.  Abdominal: Soft. Bowel sounds are normal.  Musculoskeletal: She exhibits no edema and no tenderness.       Increasing size of Heberden's nose on the distal interphalangeal joints of the hands bilaterally a significant 2 cm cyst over the third digit of the right hand MCP joint  Lymphadenopathy:    She has no cervical adenopathy.  Neurological: She is alert and oriented to person, place, and time. She has normal reflexes. No cranial nerve deficit.  Skin: Skin is warm and dry. She is not diaphoretic.  Psychiatric: She has a normal mood and affect. Her behavior is normal.          Assessment & Plan:  Worsening osteoarthritis discussed as you for exercise use of anti-inflammatories judiciously we'll monitor and blood pressure continued weight reduction and dietary modification discussed supplements which have been proven or shown to potentially affect arthritic pain. A urinalysis obtained today was positive for leukocytes a urine culture will be obtained due to the recurrent nature of her urinary tract infection she was placed on Macrodantin 100 mg twice a day for 14 days MCP joint on right hand was prepped in a sterile manner using a 20-gauge needle and a 10 cc syringe approximately 2 cc of thick fluid was withdrawn from the ganglion cyst type bandage was applied over the cyst to control swelling and drainage and instructions were given to the patient continue type bandage.  Should this fail to result in the resolution of the ganglion cyst and referred to a hand  surgeon as was discussed with the patient in detail

## 2011-06-19 LAB — CBC
HCT: 32.7 % — ABNORMAL LOW (ref 36.0–46.0)
HCT: 35.2 % — ABNORMAL LOW (ref 36.0–46.0)
Hemoglobin: 12.8 g/dL (ref 12.0–15.0)
Hemoglobin: 14.7 g/dL (ref 12.0–15.0)
MCHC: 32.9 g/dL (ref 30.0–36.0)
MCHC: 33.2 g/dL (ref 30.0–36.0)
MCHC: 33.8 g/dL (ref 30.0–36.0)
MCHC: 33.8 g/dL (ref 30.0–36.0)
MCV: 88.7 fL (ref 78.0–100.0)
MCV: 88.9 fL (ref 78.0–100.0)
MCV: 89 fL (ref 78.0–100.0)
MCV: 90.1 fL (ref 78.0–100.0)
Platelets: 233 10*3/uL (ref 150–400)
Platelets: 240 10*3/uL (ref 150–400)
Platelets: 258 10*3/uL (ref 150–400)
Platelets: 377 10*3/uL (ref 150–400)
RBC: 3.76 MIL/uL — ABNORMAL LOW (ref 3.87–5.11)
RBC: 3.96 MIL/uL (ref 3.87–5.11)
RBC: 4.31 MIL/uL (ref 3.87–5.11)
RBC: 5 MIL/uL (ref 3.87–5.11)
RDW: 13.9 % (ref 11.5–15.5)
WBC: 21.9 10*3/uL — ABNORMAL HIGH (ref 4.0–10.5)
WBC: 26.5 10*3/uL — ABNORMAL HIGH (ref 4.0–10.5)
WBC: 29.7 10*3/uL — ABNORMAL HIGH (ref 4.0–10.5)
WBC: 31.3 10*3/uL — ABNORMAL HIGH (ref 4.0–10.5)

## 2011-06-19 LAB — CULTURE, BLOOD (ROUTINE X 2)
Culture: NO GROWTH
Report Status: 12212009
Report Status: 12222009

## 2011-06-19 LAB — BASIC METABOLIC PANEL
BUN: 13 mg/dL (ref 6–23)
BUN: 13 mg/dL (ref 6–23)
BUN: 16 mg/dL (ref 6–23)
CO2: 23 mEq/L (ref 19–32)
CO2: 30 mEq/L (ref 19–32)
CO2: 31 mEq/L (ref 19–32)
Calcium: 10.1 mg/dL (ref 8.4–10.5)
Calcium: 8.4 mg/dL (ref 8.4–10.5)
Chloride: 108 mEq/L (ref 96–112)
Chloride: 110 mEq/L (ref 96–112)
Chloride: 96 mEq/L (ref 96–112)
Creatinine, Ser: 0.58 mg/dL (ref 0.4–1.2)
Creatinine, Ser: 0.72 mg/dL (ref 0.4–1.2)
GFR calc Af Amer: >60 mL/min (ref >60)
GFR calc Af Amer: >60 mL/min (ref >60)
Glucose, Bld: 121 mg/dL — ABNORMAL HIGH (ref 70–99)
Glucose, Bld: 171 mg/dL — ABNORMAL HIGH (ref 70–99)
Potassium: 3.5 mEq/L (ref 3.5–5.1)
Potassium: 3.8 mEq/L (ref 3.5–5.1)
Sodium: 137 mEq/L (ref 135–145)

## 2011-06-19 LAB — DIFFERENTIAL
Basophils Relative: 0 % (ref 0–1)
Basophils Relative: 0 % (ref 0–1)
Basophils Relative: 0 % (ref 0–1)
Blasts: 0 %
Eosinophils Absolute: 0 10*3/uL (ref 0.0–0.7)
Eosinophils Absolute: 0 10*3/uL (ref 0.0–0.7)
Eosinophils Absolute: 0 10*3/uL (ref 0.0–0.7)
Eosinophils Relative: 0 % (ref 0–5)
Eosinophils Relative: 0 % (ref 0–5)
Eosinophils Relative: 0 % (ref 0–5)
Eosinophils Relative: 0 % (ref 0–5)
Eosinophils Relative: 0 % (ref 0–5)
Lymphocytes Relative: 8 % — ABNORMAL LOW (ref 12–46)
Lymphocytes Relative: 8 % — ABNORMAL LOW (ref 12–46)
Lymphs Abs: 1.3 10*3/uL (ref 0.7–4.0)
Lymphs Abs: 1.3 10*3/uL (ref 0.7–4.0)
Lymphs Abs: 1.8 10*3/uL (ref 0.7–4.0)
Lymphs Abs: 2.4 10*3/uL (ref 0.7–4.0)
Lymphs Abs: 3.6 10*3/uL (ref 0.7–4.0)
Monocytes Absolute: 1.2 10*3/uL — ABNORMAL HIGH (ref 0.1–1.0)
Monocytes Absolute: 1.6 10*3/uL — ABNORMAL HIGH (ref 0.1–1.0)
Monocytes Absolute: 1.9 10*3/uL — ABNORMAL HIGH (ref 0.1–1.0)
Monocytes Relative: 11 % (ref 3–12)
Monocytes Relative: 7 % (ref 3–12)
Myelocytes: 0 %
Neutro Abs: 13.4 10*3/uL — ABNORMAL HIGH (ref 1.7–7.7)
Neutrophils Relative %: 67 % (ref 43–77)
Neutrophils Relative %: 83 % — ABNORMAL HIGH (ref 43–77)
Neutrophils Relative %: 88 % — ABNORMAL HIGH (ref 43–77)
Promyelocytes Absolute: 0 %
WBC Morphology: INCREASED
nRBC: 0 /100 WBC

## 2011-06-19 LAB — HEPATIC FUNCTION PANEL
Alkaline Phosphatase: 77 U/L (ref 39–117)
Indirect Bilirubin: 0.8 mg/dL (ref 0.3–0.9)
Total Bilirubin: 0.9 mg/dL (ref 0.3–1.2)

## 2011-06-19 LAB — COMPREHENSIVE METABOLIC PANEL
ALT: 22 U/L (ref 0–35)
AST: 22 U/L (ref 0–37)
AST: 28 U/L (ref 0–37)
Albumin: 2.6 g/dL — ABNORMAL LOW (ref 3.5–5.2)
Alkaline Phosphatase: 62 U/L (ref 39–117)
CO2: 28 mEq/L (ref 19–32)
Calcium: 8.4 mg/dL (ref 8.4–10.5)
Calcium: 8.6 mg/dL (ref 8.4–10.5)
Chloride: 101 mEq/L (ref 96–112)
Creatinine, Ser: 1.04 mg/dL (ref 0.4–1.2)
GFR calc Af Amer: 56 mL/min — ABNORMAL LOW (ref >60)
GFR calc Af Amer: >60 mL/min (ref >60)
GFR calc non Af Amer: 46 mL/min — ABNORMAL LOW (ref >60)
Potassium: 3.5 mEq/L (ref 3.5–5.1)
Sodium: 137 mEq/L (ref 135–145)
Total Bilirubin: 1 mg/dL (ref 0.3–1.2)

## 2011-06-19 LAB — URINALYSIS, ROUTINE W REFLEX MICROSCOPIC
Glucose, UA: NEGATIVE mg/dL
Protein, ur: 30 mg/dL — AB
Specific Gravity, Urine: 1.02 (ref 1.005–1.030)
pH: 6 (ref 5.0–8.0)

## 2011-06-19 LAB — URINE CULTURE: Colony Count: >=100000

## 2011-06-19 LAB — URINE MICROSCOPIC-ADD ON

## 2011-06-19 LAB — BLOOD GAS, ARTERIAL
Acid-base deficit: 3.4 mmol/L — ABNORMAL HIGH (ref 0.0–2.0)
O2 Content: 2 L/min
O2 Saturation: 94.7 %
pCO2 arterial: 33.8 mmHg — ABNORMAL LOW (ref 35.0–45.0)

## 2011-06-27 ENCOUNTER — Encounter: Payer: BC Managed Care – PPO | Admitting: Internal Medicine

## 2011-07-24 ENCOUNTER — Telehealth: Payer: Self-pay

## 2011-07-24 NOTE — Telephone Encounter (Signed)
Pt called and stated that she has a UTI and pt would like to come in for an appt tomorrow because if this problem continues it can turn into something serious.  Pls advise.

## 2011-07-25 ENCOUNTER — Other Ambulatory Visit: Payer: Self-pay | Admitting: *Deleted

## 2011-07-25 MED ORDER — CIPROFLOXACIN HCL 250 MG PO TABS: 250.0000 mg | ORAL_TABLET | Freq: Two times a day (BID) | ORAL | Status: AC

## 2011-07-25 NOTE — Telephone Encounter (Signed)
Per dr Annamary Rummage in cipro 250 1 bid for 7 days- pt informed  And med sent in

## 2011-08-05 ENCOUNTER — Other Ambulatory Visit: Payer: Self-pay | Admitting: Internal Medicine

## 2011-08-05 MED ORDER — AMPHETAMINE-DEXTROAMPHETAMINE 20 MG PO TABS: 20.0000 mg | ORAL_TABLET | Freq: Two times a day (BID) | ORAL | Status: DC

## 2011-08-05 NOTE — Telephone Encounter (Signed)
Pt req refill of amphetamine-dextroamphetamine (ADDERALL, 20MG ,) 20 MG tablet #60 1 pill bid.

## 2011-08-05 NOTE — Telephone Encounter (Signed)
Pt informed scripts are ready for pick up 

## 2011-08-18 ENCOUNTER — Telehealth: Payer: Self-pay

## 2011-08-18 NOTE — Telephone Encounter (Signed)
Pt states she has another UTI.  Pt had a uti about 3 weeks ago.  Pt would like a medication called in to her pharmacy.  If pt needs an ov please let her know.  Pls advise.

## 2011-08-18 NOTE — Telephone Encounter (Signed)
Pt to come in am for ua and c and s

## 2011-08-19 ENCOUNTER — Encounter: Payer: Self-pay | Admitting: Internal Medicine

## 2011-08-19 ENCOUNTER — Other Ambulatory Visit (INDEPENDENT_AMBULATORY_CARE_PROVIDER_SITE_OTHER): Payer: Medicare Other | Admitting: Internal Medicine

## 2011-08-19 ENCOUNTER — Other Ambulatory Visit: Payer: Self-pay | Admitting: Internal Medicine

## 2011-08-19 VITALS — BP 136/80 | HR 76 | Temp 98.0°F | Resp 16 | Ht 64.0 in | Wt 182.0 lb

## 2011-08-19 DIAGNOSIS — N390 Urinary tract infection, site not specified: Secondary | ICD-10-CM

## 2011-08-19 DIAGNOSIS — R339 Retention of urine, unspecified: Secondary | ICD-10-CM

## 2011-08-19 LAB — POCT URINALYSIS DIPSTICK
Glucose, UA: NEGATIVE
Nitrite, UA: POSITIVE
Urobilinogen, UA: 0.2

## 2011-08-19 NOTE — Progress Notes (Signed)
Addended by: Stacie Glaze MD E on: 08/19/2011 12:27 PM   Modules accepted: Level of Service

## 2011-08-19 NOTE — Patient Instructions (Signed)
Do the kegel exercized at least 3 times during urination stopped and start her stand up at the end of urination and sit back down and see if there is any residual urine. Consider cranberry tablets on a regular basis. Take the antibiotic for 5 days Urine culture should be back before then If we have continued infections and we should see a urologist for a bladder emptying study

## 2011-08-19 NOTE — Progress Notes (Signed)
Subjective:    Patient ID: [name], female    DOB: 05-Apr-1941, 70 y.o.   MRN: [id number]  HPI Frequent UTI Pt has been religious with cleanliness and wears poise    Review of Systems  Constitutional: Negative for activity change, appetite change and fatigue.  HENT: Negative for ear pain, congestion, neck pain, postnasal drip and sinus pressure.   Eyes: Negative for redness and visual disturbance.  Respiratory: Negative for cough, shortness of breath and wheezing.   Gastrointestinal: Negative for abdominal pain and abdominal distention.  Genitourinary: Positive for dysuria and frequency. Negative for menstrual problem.  Musculoskeletal: Negative for myalgias, joint swelling and arthralgias.  Skin: Negative for rash and wound.  Neurological: Negative for dizziness, weakness and headaches.  Hematological: Negative for adenopathy. Does not bruise/bleed easily.  Psychiatric/Behavioral: Negative for sleep disturbance and decreased concentration.   Past Medical History  Diagnosis Date  . HYPERLIPIDEMIA NEC/NOS 04/19/2007  . HYPERTENSION 04/07/2007  . ALLERGIC RHINITIS 11/30/2008  . ASTHMA UNSPECIFIED WITH EXACERBATION 02/17/2008  . Esophageal reflux 11/08/2008  . OSTEOARTHRITIS 04/07/2007  . OSTEOPENIA 03/10/2008  . INSOMNIA 08/07/2010  . CAROTID BRUIT, LEFT 04/19/2007  . CLOSED FRACTURE OF METATARSAL BONE 06/04/2009  . ALLERGIC RHINITIS 11/30/2008  . Closed fracture of lateral malleolus 06/04/2009  . GANGLION CYST 04/19/2007  . UTI 07/20/2009    History   Social History  . Marital Status: Married    Spouse Name: N/A    Number of Children: N/A  . Years of Education: N/A   Occupational History  . retired    Social History Main Topics  . Smoking status: Never Smoker   . Smokeless tobacco: Never Used   Comment: smoked 1.5 for  20 years  . Alcohol Use: Yes  . Drug Use: No  . Sexually Active: Yes   Other Topics Concern  . Not on file   Social History Narrative  . No  narrative on file    Past Surgical History  Procedure Date  . Tubal ligation   . Total hip arthroplasty   . Knuckle replacement     Family History  Problem Relation Age of Onset  . Heart disease Mother   . COPD Father   . Cancer Brother   . Alzheimer's disease Sister     No Known Allergies  Current Outpatient Prescriptions on File Prior to Visit  Medication Sig Dispense Refill  . amphetamine-dextroamphetamine (ADDERALL, 20MG ,) 20 MG tablet Take 1 tablet (20 mg total) by mouth 2 (two) times daily.  60 tablet  0  . amphetamine-dextroamphetamine (ADDERALL, 20MG ,) 20 MG tablet Take 1 tablet (20 mg total) by mouth 2 (two) times daily.  60 tablet  0  . aspirin-acetaminophen-caffeine (EXCEDRIN MIGRAINE) 250-250-65 MG per tablet Take 1 tablet by mouth every 6 (six) hours as needed.        Marland Kitchen buPROPion (WELLBUTRIN XL) 300 MG 24 hr tablet Take 1 tablet (300 mg total) by mouth daily.  30 tablet  11  . Doxepin HCl (SILENOR) 6 MG TABS Take by mouth at bedtime as needed. 1 at bedtime       . FLUoxetine (PROZAC) 20 MG capsule Take 1 capsule (20 mg total) by mouth daily.  30 capsule  11  . mometasone (NASONEX) 50 MCG/ACT nasal spray 2 sprays by Nasal route daily.  17 g  11  . Multiple Vitamin (MULTIVITAMIN) capsule Take 1 capsule by mouth daily.        . naproxen sodium (ANAPROX) 220 MG tablet  Take 220 mg by mouth daily as needed.        . Testosterone Propionate POWD APPLY 1/4 TEASPOONFUL    EXTERNALLY AS DIRECTEDEACH NIGHT AT BEDTIME.  5 g  3  . valsartan-hydrochlorothiazide (DIOVAN-HCT) 320-25 MG per tablet Take 1 tablet by mouth daily.  30 tablet  11    BP 136/80  Pulse 76  Temp 98 F (36.7 C)  Resp 16  Ht 5\' 4"  (1.626 m)  Wt 182 lb (82.555 kg)  BMI 31.24 kg/m2       Objective:   Physical Exam  Nursing note and vitals reviewed. Constitutional: She is oriented to person, place, and time. She appears well-developed and well-nourished. No distress.  HENT:  Head: Normocephalic and  atraumatic.  Right Ear: External ear normal.  Left Ear: External ear normal.  Nose: Nose normal.  Mouth/Throat: Oropharynx is clear and moist.  Eyes: Conjunctivae and EOM are normal. Pupils are equal, round, and reactive to light.  Neck: Normal range of motion. Neck supple. No JVD present. No tracheal deviation present. No thyromegaly present.  Cardiovascular: Normal rate, regular rhythm, normal heart sounds and intact distal pulses.   No murmur heard. Pulmonary/Chest: Effort normal and breath sounds normal. She has no wheezes. She exhibits no tenderness.  Abdominal: Soft. Bowel sounds are normal.  Musculoskeletal: Normal range of motion. She exhibits no edema and no tenderness.  Lymphadenopathy:    She has no cervical adenopathy.  Neurological: She is alert and oriented to person, place, and time. She has normal reflexes. No cranial nerve deficit.  Skin: Skin is warm and dry. She is not diaphoretic.  Psychiatric: She has a normal mood and affect. Her behavior is normal.          Assessment & Plan:  Incomplete emptying of bladder Do Kegel exercises Culture the urine today The next step may be bladder empty study/

## 2011-08-22 LAB — URINE CULTURE: Colony Count: >=100000

## 2011-08-29 ENCOUNTER — Encounter: Payer: BC Managed Care – PPO | Admitting: Internal Medicine

## 2011-10-16 ENCOUNTER — Encounter: Payer: Self-pay | Admitting: Internal Medicine

## 2011-10-16 ENCOUNTER — Ambulatory Visit (INDEPENDENT_AMBULATORY_CARE_PROVIDER_SITE_OTHER): Payer: Medicare Other | Admitting: Internal Medicine

## 2011-10-16 VITALS — BP 130/84 | HR 72 | Temp 98.2°F | Resp 16 | Ht 64.0 in | Wt 176.0 lb

## 2011-10-16 DIAGNOSIS — E785 Hyperlipidemia, unspecified: Secondary | ICD-10-CM

## 2011-10-16 DIAGNOSIS — T887XXA Unspecified adverse effect of drug or medicament, initial encounter: Secondary | ICD-10-CM

## 2011-10-16 DIAGNOSIS — M810 Age-related osteoporosis without current pathological fracture: Secondary | ICD-10-CM | POA: Diagnosis not present

## 2011-10-16 DIAGNOSIS — I10 Essential (primary) hypertension: Secondary | ICD-10-CM | POA: Diagnosis not present

## 2011-10-16 DIAGNOSIS — N390 Urinary tract infection, site not specified: Secondary | ICD-10-CM

## 2011-10-16 DIAGNOSIS — Z0000 Encounter for general adult medical examination without abnormal findings: Secondary | ICD-10-CM

## 2011-10-16 DIAGNOSIS — Z23 Encounter for immunization: Secondary | ICD-10-CM

## 2011-10-16 LAB — POCT URINALYSIS DIPSTICK
Bilirubin, UA: NEGATIVE
Glucose, UA: NEGATIVE
Ketones, UA: NEGATIVE
Spec Grav, UA: 1.02

## 2011-10-16 LAB — CBC WITH DIFFERENTIAL/PLATELET
Basophils Relative: 0.8 % (ref 0.0–3.0)
Eosinophils Relative: 2.4 % (ref 0.0–5.0)
Hemoglobin: 14.2 g/dL (ref 12.0–15.0)
Lymphocytes Relative: 38.1 % (ref 12.0–46.0)
MCV: 89.6 fl (ref 78.0–100.0)
Monocytes Absolute: 0.6 10*3/uL (ref 0.1–1.0)
Neutrophils Relative %: 51.1 % (ref 43.0–77.0)
RBC: 4.77 Mil/uL (ref 3.87–5.11)
WBC: 7.4 10*3/uL (ref 4.5–10.5)

## 2011-10-16 LAB — HEPATIC FUNCTION PANEL
ALT: 26 U/L (ref 0–35)
AST: 26 U/L (ref 0–37)
Albumin: 3.9 g/dL (ref 3.5–5.2)
Alkaline Phosphatase: 64 U/L (ref 39–117)

## 2011-10-16 LAB — BASIC METABOLIC PANEL
CO2: 28 mEq/L (ref 19–32)
Chloride: 105 mEq/L (ref 96–112)
GFR: 77.21 mL/min (ref >60.00)
Glucose, Bld: 94 mg/dL (ref 70–99)
Potassium: 3.8 mEq/L (ref 3.5–5.1)
Sodium: 142 mEq/L (ref 135–145)

## 2011-10-16 LAB — LIPID PANEL: VLDL: 9.6 mg/dL (ref 0.0–40.0)

## 2011-10-16 MED ORDER — AMPHETAMINE-DEXTROAMPHETAMINE 20 MG PO TABS: 20.0000 mg | ORAL_TABLET | Freq: Two times a day (BID) | ORAL | Status: DC

## 2011-10-16 MED ORDER — DOXEPIN HCL 6 MG PO TABS: 6.0000 mg | ORAL_TABLET | Freq: Every evening | ORAL | Status: DC | PRN

## 2011-10-16 MED ORDER — CIPROFLOXACIN HCL 250 MG PO TABS: 250.0000 mg | ORAL_TABLET | Freq: Two times a day (BID) | ORAL | Status: AC

## 2011-10-16 NOTE — Progress Notes (Signed)
Subjective:    Patient ID: [name], female    DOB: [birth year], 71 y.o.   MRN: [id number]  HPI Patient is a 71 year old female who presents for a Medicare wellness examination and for followup of chronic problems.  She is followed for hypertension 4 hyperlipidemia for a history of esophageal reflux and for osteoarthritis.  She is treated for depression with a combination of bupropion doxepin Prozac and Adderall.    Review of Systems  Constitutional: Negative for activity change, appetite change and fatigue.  HENT: Negative for ear pain, congestion, neck pain, postnasal drip and sinus pressure.   Eyes: Negative for redness and visual disturbance.  Respiratory: Negative for cough, shortness of breath and wheezing.   Gastrointestinal: Negative for abdominal pain and abdominal distention.  Genitourinary: Negative for dysuria, frequency and menstrual problem.  Musculoskeletal: Negative for myalgias, joint swelling and arthralgias.  Skin: Negative for rash and wound.  Neurological: Negative for dizziness, weakness and headaches.  Hematological: Negative for adenopathy. Does not bruise/bleed easily.  Psychiatric/Behavioral: Negative for sleep disturbance and decreased concentration.   Past Medical History  Diagnosis Date  . HYPERLIPIDEMIA NEC/NOS 04/19/2007  . HYPERTENSION 04/07/2007  . ALLERGIC RHINITIS 11/30/2008  . ASTHMA UNSPECIFIED WITH EXACERBATION 02/17/2008  . Esophageal reflux 11/08/2008  . OSTEOARTHRITIS 04/07/2007  . OSTEOPENIA 03/10/2008  . INSOMNIA 08/07/2010  . CAROTID BRUIT, LEFT 04/19/2007  . CLOSED FRACTURE OF METATARSAL BONE 06/04/2009  . ALLERGIC RHINITIS 11/30/2008  . Closed fracture of lateral malleolus 06/04/2009  . GANGLION CYST 04/19/2007  . UTI 07/20/2009    History   Social History  . Marital Status: Married    Spouse Name: N/A    Number of Children: N/A  . Years of Education: N/A   Occupational History  . retired    Social History Main Topics  .  Smoking status: Never Smoker   . Smokeless tobacco: Never Used   Comment: smoked 1.5 for  20 years  . Alcohol Use: Yes  . Drug Use: No  . Sexually Active: Yes   Other Topics Concern  . Not on file   Social History Narrative  . No narrative on file    Past Surgical History  Procedure Date  . Tubal ligation   . Total hip arthroplasty   . Knuckle replacement     Family History  Problem Relation Age of Onset  . Heart disease Mother   . COPD Father   . Cancer Brother   . Alzheimer's disease Sister     No Known Allergies  Current Outpatient Prescriptions on File Prior to Visit  Medication Sig Dispense Refill  . aspirin-acetaminophen-caffeine (EXCEDRIN MIGRAINE) 250-250-65 MG per tablet Take 1 tablet by mouth every 6 (six) hours as needed.        Marland Kitchen buPROPion (WELLBUTRIN XL) 300 MG 24 hr tablet Take 1 tablet (300 mg total) by mouth daily.  30 tablet  11  . FLUoxetine (PROZAC) 20 MG capsule Take 1 capsule (20 mg total) by mouth daily.  30 capsule  11  . Multiple Vitamin (MULTIVITAMIN) capsule Take 1 capsule by mouth daily.        . Testosterone Propionate POWD APPLY 1/4 TEASPOONFUL    EXTERNALLY AS DIRECTEDEACH NIGHT AT BEDTIME.  5 g  3  . valsartan-hydrochlorothiazide (DIOVAN-HCT) 320-25 MG per tablet Take 1 tablet by mouth daily.  30 tablet  11  . DISCONTD: amphetamine-dextroamphetamine (ADDERALL, 20MG ,) 20 MG tablet Take 1 tablet (20 mg total) by mouth 2 (two) times daily.  60 tablet  0  . DISCONTD: amphetamine-dextroamphetamine (ADDERALL, 20MG ,) 20 MG tablet Take 1 tablet (20 mg total) by mouth 2 (two) times daily.  60 tablet  0  . DISCONTD: Doxepin HCl (SILENOR) 6 MG TABS Take by mouth at bedtime as needed. 1 at bedtime         BP 130/84  Pulse 72  Temp 98.2 F (36.8 C)  Resp 16  Ht 5\' 4"  (1.626 m)  Wt 176 lb (79.833 kg)  BMI 30.21 kg/m2        Objective:   Physical Exam  Nursing note and vitals reviewed. Constitutional: She is oriented to person, place, and  time. She appears well-developed and well-nourished. No distress.  HENT:  Head: Normocephalic and atraumatic.  Right Ear: External ear normal.  Left Ear: External ear normal.  Nose: Nose normal.  Mouth/Throat: Oropharynx is clear and moist.  Eyes: Conjunctivae and EOM are normal. Pupils are equal, round, and reactive to light.  Neck: Normal range of motion. Neck supple. No JVD present. No tracheal deviation present. No thyromegaly present.  Cardiovascular: Normal rate, regular rhythm, normal heart sounds and intact distal pulses.   No murmur heard. Pulmonary/Chest: Effort normal and breath sounds normal. She has no wheezes. She exhibits no tenderness.  Abdominal: Soft. Bowel sounds are normal.  Musculoskeletal: Normal range of motion. She exhibits no edema and no tenderness.  Lymphadenopathy:    She has no cervical adenopathy.  Neurological: She is alert and oriented to person, place, and time. She has normal reflexes. No cranial nerve deficit.  Skin: Skin is warm and dry. She is not diaphoretic.  Psychiatric: She has a normal mood and affect. Her behavior is normal.          Assessment & Plan:  The patient's blood pressure is stable on her current medications which include do you have in HCTZ. We will monitor basic metabolic panel to assess potassium and creatinine levels on this medication.  She has a history of osteoarthritis and takes excedrine on an intermittent basis for headaches She is stable and compliant with her medications She requires a bone density test She is due a colonoscopy for colon cancer screening Subjective:    [name] is a 71 y.o. female who presents for Medicare Initial preventive examination.  Preventive Screening-Counseling & Management  Tobacco History  Smoking status  . Never Smoker   Smokeless tobacco  . Never Used  Comment: smoked 1.5 for  20 years     Problems Prior to Visit 1.   Current Problems (verified) Patient Active  Problem List  Diagnoses  . HYPERLIPIDEMIA NEC/NOS  . HYPERTENSION  . ALLERGIC RHINITIS  . ASTHMA UNSPECIFIED WITH EXACERBATION  . OSTEOARTHRITIS  . OSTEOPENIA  . INSOMNIA  . CAROTID BRUIT, LEFT  . Depression, prolonged    Medications Prior to Visit Current Outpatient Prescriptions on File Prior to Visit  Medication Sig Dispense Refill  . aspirin-acetaminophen-caffeine (EXCEDRIN MIGRAINE) 250-250-65 MG per tablet Take 1 tablet by mouth every 6 (six) hours as needed.        Marland Kitchen buPROPion (WELLBUTRIN XL) 300 MG 24 hr tablet Take 1 tablet (300 mg total) by mouth daily.  30 tablet  11  . FLUoxetine (PROZAC) 20 MG capsule Take 1 capsule (20 mg total) by mouth daily.  30 capsule  11  . Multiple Vitamin (MULTIVITAMIN) capsule Take 1 capsule by mouth daily.        . Testosterone Propionate POWD APPLY 1/4 TEASPOONFUL  EXTERNALLY AS DIRECTEDEACH NIGHT AT BEDTIME.  5 g  3  . valsartan-hydrochlorothiazide (DIOVAN-HCT) 320-25 MG per tablet Take 1 tablet by mouth daily.  30 tablet  11  . DISCONTD: amphetamine-dextroamphetamine (ADDERALL, 20MG ,) 20 MG tablet Take 1 tablet (20 mg total) by mouth 2 (two) times daily.  60 tablet  0  . DISCONTD: amphetamine-dextroamphetamine (ADDERALL, 20MG ,) 20 MG tablet Take 1 tablet (20 mg total) by mouth 2 (two) times daily.  60 tablet  0  . DISCONTD: Doxepin HCl (SILENOR) 6 MG TABS Take by mouth at bedtime as needed. 1 at bedtime         Current Medications (verified) Current Outpatient Prescriptions  Medication Sig Dispense Refill  . amphetamine-dextroamphetamine (ADDERALL, 20MG ,) 20 MG tablet Take 1 tablet (20 mg total) by mouth 2 (two) times daily.  60 tablet  0  . aspirin-acetaminophen-caffeine (EXCEDRIN MIGRAINE) 250-250-65 MG per tablet Take 1 tablet by mouth every 6 (six) hours as needed.        Marland Kitchen buPROPion (WELLBUTRIN XL) 300 MG 24 hr tablet Take 1 tablet (300 mg total) by mouth daily.  30 tablet  11  . Doxepin HCl (SILENOR) 6 MG TABS Take 1 tablet (6 mg  total) by mouth at bedtime as needed. 1 at bedtime  30 tablet  3  . FLUoxetine (PROZAC) 20 MG capsule Take 1 capsule (20 mg total) by mouth daily.  30 capsule  11  . Multiple Vitamin (MULTIVITAMIN) capsule Take 1 capsule by mouth daily.        . Testosterone Propionate POWD APPLY 1/4 TEASPOONFUL    EXTERNALLY AS DIRECTEDEACH NIGHT AT BEDTIME.  5 g  3  . valsartan-hydrochlorothiazide (DIOVAN-HCT) 320-25 MG per tablet Take 1 tablet by mouth daily.  30 tablet  11  . DISCONTD: amphetamine-dextroamphetamine (ADDERALL, 20MG ,) 20 MG tablet Take 1 tablet (20 mg total) by mouth 2 (two) times daily.  60 tablet  0  . DISCONTD: amphetamine-dextroamphetamine (ADDERALL, 20MG ,) 20 MG tablet Take 1 tablet (20 mg total) by mouth 2 (two) times daily.  60 tablet  0  . DISCONTD: amphetamine-dextroamphetamine (ADDERALL, 20MG ,) 20 MG tablet Take 1 tablet (20 mg total) by mouth 2 (two) times daily.  60 tablet  0  . DISCONTD: amphetamine-dextroamphetamine (ADDERALL, 20MG ,) 20 MG tablet Take 1 tablet (20 mg total) by mouth 2 (two) times daily.  60 tablet  0  . DISCONTD: Doxepin HCl (SILENOR) 6 MG TABS Take by mouth at bedtime as needed. 1 at bedtime          Allergies (verified) Review of patient's allergies indicates no known allergies.   PAST HISTORY  Family History Family History  Problem Relation Age of Onset  . Heart disease Mother   . COPD Father   . Cancer Brother   . Alzheimer's disease Sister     Social History History  Substance Use Topics  . Smoking status: Never Smoker   . Smokeless tobacco: Never Used   Comment: smoked 1.5 for  20 years  . Alcohol Use: Yes     Are there smokers in your home (other than you)? No  Risk Factors Current exercise habits: The patient does not participate in regular exercise at present.  Dietary issues discussed: diet  Cardiac risk factors: advanced age (older than 58 for men, 38 for women), hypertension and sedentary lifestyle.  Depression Screen (Note: if  answer to either of the following is "Yes", a more complete depression screening is indicated)   Over the past 2  weeks, have you felt down, depressed or hopeless? No  Over the past 2 weeks, have you felt little interest or pleasure in doing things? No  Have you lost interest or pleasure in daily life? No  Do you often feel hopeless? No  Do you cry easily over simple problems? No  Activities of Daily Living In your present state of health, do you have any difficulty performing the following activities?:  Driving? No Managing money?  No Feeding yourself? No Getting from bed to chair? No Climbing a flight of stairs? No Preparing food and eating?: No Bathing or showering? No Getting dressed: No Getting to the toilet? No Using the toilet:No Moving around from place to place: No In the past year have you fallen or had a near fall?:No   Are you sexually active?  Yes  Do you have more than one partner?  No  Hearing Difficulties: No Do you often ask people to speak up or repeat themselves? No Do you experience ringing or noises in your ears? No Do you have difficulty understanding soft or whispered voices? No   Do you feel that you have a problem with memory? No  Do you often misplace items? No  Do you feel safe at home?  No  Cognitive Testing  Alert? Yes  Normal Appearance?Yes  Oriented to person? Yes  Place? Yes   Time? Yes  Recall of three objects?  Yes  Can perform simple calculations? Yes  Displays appropriate judgment?Yes  Can read the correct time from a watch face?Yes   Advanced Directives have been discussed with the patient? Yes  List the Names of Other Physician/Practitioners you currently use: 1.    Indicate any recent Medical Services you may have received from other than [facility] providers in the past year (date may be approximate).  Immunization History  Administered Date(s) Administered  . Influenza Split 07/17/2011  . Influenza Whole 07/20/2007, 06/09/2008,  07/20/2009, 08/22/2010  . Pneumococcal Polysaccharide 09/16/2003, 10/16/2011  . Td 09/15/2005    Screening Tests Health Maintenance  Topic Date Due  . Colonoscopy  01/02/1990  . Zostavax  01/03/2000  . Pneumococcal Polysaccharide Vaccine Age 65 And Over  01/02/2005  . Influenza Vaccine  06/16/2011  . Tetanus/tdap  09/16/2015    All answers were reviewed with the patient and necessary referrals were made:  Carrie Mew, MD   10/16/2011   History reviewed: allergies, current medications, past family history, past medical history, past social history, past surgical history and problem list  Review of Systems A comprehensive review of systems was negative.    Objective:     Vision by Snellen chart: right eye:20/20, left eye:20/20  Body mass index is 30.21 kg/(m^2). BP 130/84  Pulse 72  Temp 98.2 F (36.8 C)  Resp 16  Ht 5\' 4"  (1.626 m)  Wt 176 lb (79.833 kg)  BMI 30.21 kg/m2  BP 130/84  Pulse 72  Temp 98.2 F (36.8 C)  Resp 16  Ht 5\' 4"  (1.626 m)  Wt 176 lb (79.833 kg)  BMI 30.21 kg/m2  General Appearance:    Alert, cooperative, no distress, appears stated age  Head:    Normocephalic, without obvious abnormality, atraumatic  Eyes:    PERRL, conjunctiva/corneas clear, EOM's intact, fundi    benign, both eyes  Ears:    Normal TM's and external ear canals, both ears  Nose:   Nares normal, septum midline, mucosa normal, no drainage    or sinus tenderness  Throat:  Lips, mucosa, and tongue normal; teeth and gums normal  Neck:   Supple, symmetrical, trachea midline, no adenopathy;    thyroid:  no enlargement/tenderness/nodules; no carotid   bruit or JVD  Back:     Symmetric, no curvature, ROM normal, no CVA tenderness  Lungs:     Clear to auscultation bilaterally, respirations unlabored  Chest Wall:    No tenderness or deformity   Heart:    Regular rate and rhythm, S1 and S2 normal, no murmur, rub   or gallop  Breast Exam:    No tenderness, masses, or nipple  abnormality  Abdomen:     Soft, non-tender, bowel sounds active all four quadrants,    no masses, no organomegaly  Genitalia:    Normal female without lesion, discharge or tenderness  Rectal:    Normal tone, normal prostate, no masses or tenderness;   guaiac negative stool  Extremities:   Extremities normal, atraumatic, no cyanosis or edema  Pulses:   2+ and symmetric all extremities  Skin:   Skin color, texture, turgor normal, no rashes or lesions  Lymph nodes:   Cervical, supraclavicular, and axillary nodes normal  Neurologic:   CNII-XII intact, normal strength, sensation and reflexes    throughout       Assessment:      This is a routine physical examination for this healthy  Female. Reviewed all health maintenance protocols including mammography colonoscopy bone density and reviewed appropriate screening labs. Her immunization history was reviewed as well as her current medications and allergies refills of her chronic medications were given and the plan for yearly health maintenance was discussed all orders and referrals were made as appropriate.      Plan:     During the course of the visit the patient was educated and counseled about appropriate screening and preventive services including:    Influenza vaccine  Screening mammography  Bone densitometry screening  Diet review for nutrition referral? Yes ____  Not Indicated ____   Patient Instructions (the written plan) was given to the patient.  Medicare Attestation I have personally reviewed: The patient's medical and social history Their use of alcohol, tobacco or illicit drugs Their current medications and supplements The patient's functional ability including ADLs,fall risks, home safety risks, cognitive, and hearing and visual impairment Diet and physical activities Evidence for depression or mood disorders  The patient's weight, height, BMI, and visual acuity have been recorded in the chart.  I have made  referrals, counseling, and provided education to the patient based on review of the above and I have provided the patient with a written personalized care plan for preventive services.     Carrie Mew, MD   10/16/2011

## 2011-10-16 NOTE — Patient Instructions (Signed)
The patient is instructed to continue all medications as prescribed. Schedule followup with check out clerk upon leaving the clinic Shoulder Exercises EXERCISES  RANGE OF MOTION (ROM) AND STRETCHING EXERCISES These exercises may help you when beginning to rehabilitate your injury. Your symptoms may resolve with or without further involvement from your physician, physical therapist or athletic trainer. While completing these exercises, remember:   Restoring tissue flexibility helps normal motion to return to the joints. This allows healthier, less painful movement and activity.   An effective stretch should be held for at least 30 seconds.   A stretch should never be painful. You should only feel a gentle lengthening or release in the stretched tissue.  ROM - Pendulum  Bend at the waist so that your right / left arm falls away from your body. Support yourself with your opposite hand on a solid surface, such as a table or a countertop.   Your right / left arm should be perpendicular to the ground. If it is not perpendicular, you need to lean over farther. Relax the muscles in your right / left arm and shoulder as much as possible.   Gently sway your hips and trunk so they move your right / left arm without any use of your right / left shoulder muscles.   Progress your movements so that your right / left arm moves side to side, then forward and backward, and finally, both clockwise and counterclockwise.   Complete ________5__ repetitions in each direction. Many people use this exercise to relieve discomfort in their shoulder as well as to gain range of motion.  Repeat _________2_ times. Complete this exercise ______2____ times per day. STRETCH - Flexion, Standing  Stand with good posture. With an underhand grip on your right / left hand and an overhand grip on the opposite hand, grasp a broomstick or cane so that your hands are a little more than shoulder-width apart.   Keeping your right /  left elbow straight and shoulder muscles relaxed, push the stick with your opposite hand to raise your right / left arm in front of your body and then overhead. Raise your arm until you feel a stretch in your right / left shoulder, but before you have increased shoulder pain.   Try to avoid shrugging your right / left shoulder as your arm rises by keeping your shoulder blade tucked down and toward your mid-back spine. Hold ______10____ seconds.   Slowly return to the starting position.  Repeat ________5__ times. Complete this exercise ___2_______ times per day. STRETCH - Internal Rotation  Place your right / left hand behind your back, palm-up.   Throw a towel or belt over your opposite shoulder. Grasp the towel/belt with your right / left hand.   While keeping an upright posture, gently pull up on the towel/belt until you feel a stretch in the front of your right / left shoulder.   Avoid shrugging your right / left shoulder as your arm rises by keeping your shoulder blade tucked down and toward your mid-back spine.   Hold ________10sec__. Release the stretch by lowering your opposite hand.  Repeat _________5_ times. Complete this exercise _______2___ times per day. STRETCH - External Rotation and Abduction  Stagger your stance through a doorframe. It does not matter which foot is forward.   As instructed by your physician, physical therapist or athletic trainer, place your hands:   And forearms above your head and on the door frame.   And forearms at head-height and  on the door frame.   At elbow-height and on the door frame.   Keeping your head and chest upright and your stomach muscles tight to prevent over-extending your low-back, slowly shift your weight onto your front foot until you feel a stretch across your chest and/or in the front of your shoulders.   Hold ________10__ seconds. Shift your weight to your back foot to release the stretch.  Repeat _______5___ times. Complete  this stretch ___2_______ times per day.  STRENGTHENING EXERCISES  These exercises may help you when beginning to rehabilitate your injury. They may resolve your symptoms with or without further involvement from your physician, physical therapist or athletic trainer. While completing these exercises, remember:   Muscles can gain both the endurance and the strength needed for everyday activities through controlled exercises.   Complete these exercises as instructed by your physician, physical therapist or athletic trainer. Progress the resistance and repetitions only as guided.   You may experience muscle soreness or fatigue, but the pain or discomfort you are trying to eliminate should never worsen during these exercises. If this pain does worsen, stop and make certain you are following the directions exactly. If the pain is still present after adjustments, discontinue the exercise until you can discuss the trouble with your clinician.   If advised by your physician, during your recovery, avoid activity or exercises which involve actions that place your right / left hand or elbow above your head or behind your back or head. These positions stress the tissues which are trying to heal.  STRENGTH - Scapular Depression and Adduction  With good posture, sit on a firm chair. Supported your arms in front of you with pillows, arm rests or a table top. Have your elbows in line with the sides of your body.   Gently draw your shoulder blades down and toward your mid-back spine. Gradually increase the tension without tensing the muscles along the top of your shoulders and the back of your neck.   Hold for ________10__ seconds. Slowly release the tension and relax your muscles completely before completing the next repetition.   After you have practiced this exercise, remove the arm support and complete it in standing as well as sitting.  Repeat ______5____ times. Complete this exercise ________2__ times per  day.  STRENGTH - External Rotators  Secure a rubber exercise band/tubing to a fixed object so that it is at the same height as your right / left elbow when you are standing or sitting on a firm surface.   Stand or sit so that the secured exercise band/tubing is at your side that is not injured.   Bend your elbow 90 degrees. Place a folded towel or small pillow under your right / left arm so that your elbow is a few inches away from your side.   Keeping the tension on the exercise band/tubing, pull it away from your body, as if pivoting on your elbow. Be sure to keep your body steady so that the movement is only coming from your shoulder rotating.   Hold ________10__ seconds. Release the tension in a controlled manner as you return to the starting position.  Repeat ____5______ times. Complete this exercise ___2_______ times per day.  STRENGTH - Supraspinatus  Stand or sit with good posture. Grasp a ________2lb__ weight or an exercise band/tubing so that your hand is "thumbs-up," like when you shake hands.   Slowly lift your right / left hand from your thigh into the air, traveling about  30 degrees from straight out at your side. Lift your hand to shoulder height or as far as you can without increasing any shoulder pain. Initially, many people do not lift their hands above shoulder height.   Avoid shrugging your right / left shoulder as your arm rises by keeping your shoulder blade tucked down and toward your mid-back spine.   Hold for _______10___ seconds. Control the descent of your hand as you slowly return to your starting position.  Repeat ______5____ times. Complete this exercise 2 times per day.  STRENGTH - Shoulder Extensors  Secure a rubber exercise band/tubing so that it is at the height of your shoulders when you are either standing or sitting on a firm arm-less chair.   With a thumbs-up grip, grasp an end of the band/tubing in each hand. Straighten your elbows and lift your hands  straight in front of you at shoulder height. Step back away from the secured end of band/tubing until it becomes tense.   Squeezing your shoulder blades together, pull your hands down to the sides of your thighs. Do not allow your hands to go behind you.   Hold for _________10_ seconds. Slowly ease the tension on the band/tubing as you reverse the directions and return to the starting position.  Repeat ________5 __ times. Complete this exercise ____2______ times per day.  STRENGTH - Scapular Retractors  Secure a rubber exercise band/tubing so that it is at the height of your shoulders when you are either standing or sitting on a firm arm-less chair.   With a palm-down grip, grasp an end of the band/tubing in each hand. Straighten your elbows and lift your hands straight in front of you at shoulder height. Step back away from the secured end of band/tubing until it becomes tense.   Squeezing your shoulder blades together, draw your elbows back as you bend them. Keep your upper arm lifted away from your body throughout the exercise.   Hold _______10___ seconds. Slowly ease the tension on the band/tubing as you reverse the directions and return to the starting position.  Repeat _______5___ times. Complete this exercise ________2__ times per day. STRENGTH - Scapular Depressors  Find a sturdy chair without wheels, such as a from a dining room table.   Keeping your feet on the floor, lift your bottom from the seat and lock your elbows.   Keeping your elbows straight, allow gravity to pull your body weight down. Your shoulders will rise toward your ears.   Raise your body against gravity by drawing your shoulder blades down your back, shortening the distance between your shoulders and ears. Although your feet should always maintain contact with the floor, your feet should progressively support less body weight as you get stronger.   Hold _____10_____ seconds. In a controlled and slow manner, lower  your body weight to begin the next repetition.  Repeat ______5____ times. Complete this exercise _________2_ times per day.  Document Released: 07/16/2005 Document Revised: 05/14/2011 Document Reviewed: 12/14/2008 [facility] Patient Information 2012 [place], [place].

## 2011-10-16 NOTE — Progress Notes (Signed)
Addended by: Bonnye Fava on: 10/16/2011 11:17 AM   Modules accepted: Orders

## 2011-10-22 NOTE — Progress Notes (Signed)
  Subjective:    Patient ID: [name], female    DOB: 06-20-1941, 71 y.o.   MRN: [id number]  HPI    Review of Systems     Objective:   Physical Exam        Assessment & Plan:

## 2011-12-15 ENCOUNTER — Telehealth: Payer: Self-pay | Admitting: *Deleted

## 2011-12-15 MED ORDER — DICLOFENAC SODIUM 1 % TD GEL: 1.0000 "application " | Freq: Four times a day (QID) | TRANSDERMAL | Status: DC

## 2011-12-15 NOTE — Telephone Encounter (Signed)
Ok to call in voltaren gel per dr Lovell Sheehan- sent to [facility]

## 2011-12-15 NOTE — Telephone Encounter (Signed)
Pt states she has a rotator cuff tear and would like Dr. Lovell Sheehan prescribe Voltaren gel to [facility].

## 2012-01-04 IMAGING — CR DG CHEST 2V
2 series · 2 of 2 positions shown · non-contrast
Comparison: 09/22/2008

CLINICAL DATA: Cough and congestion

CHEST - 2 VIEW

[view not recorded (1 of 2)]
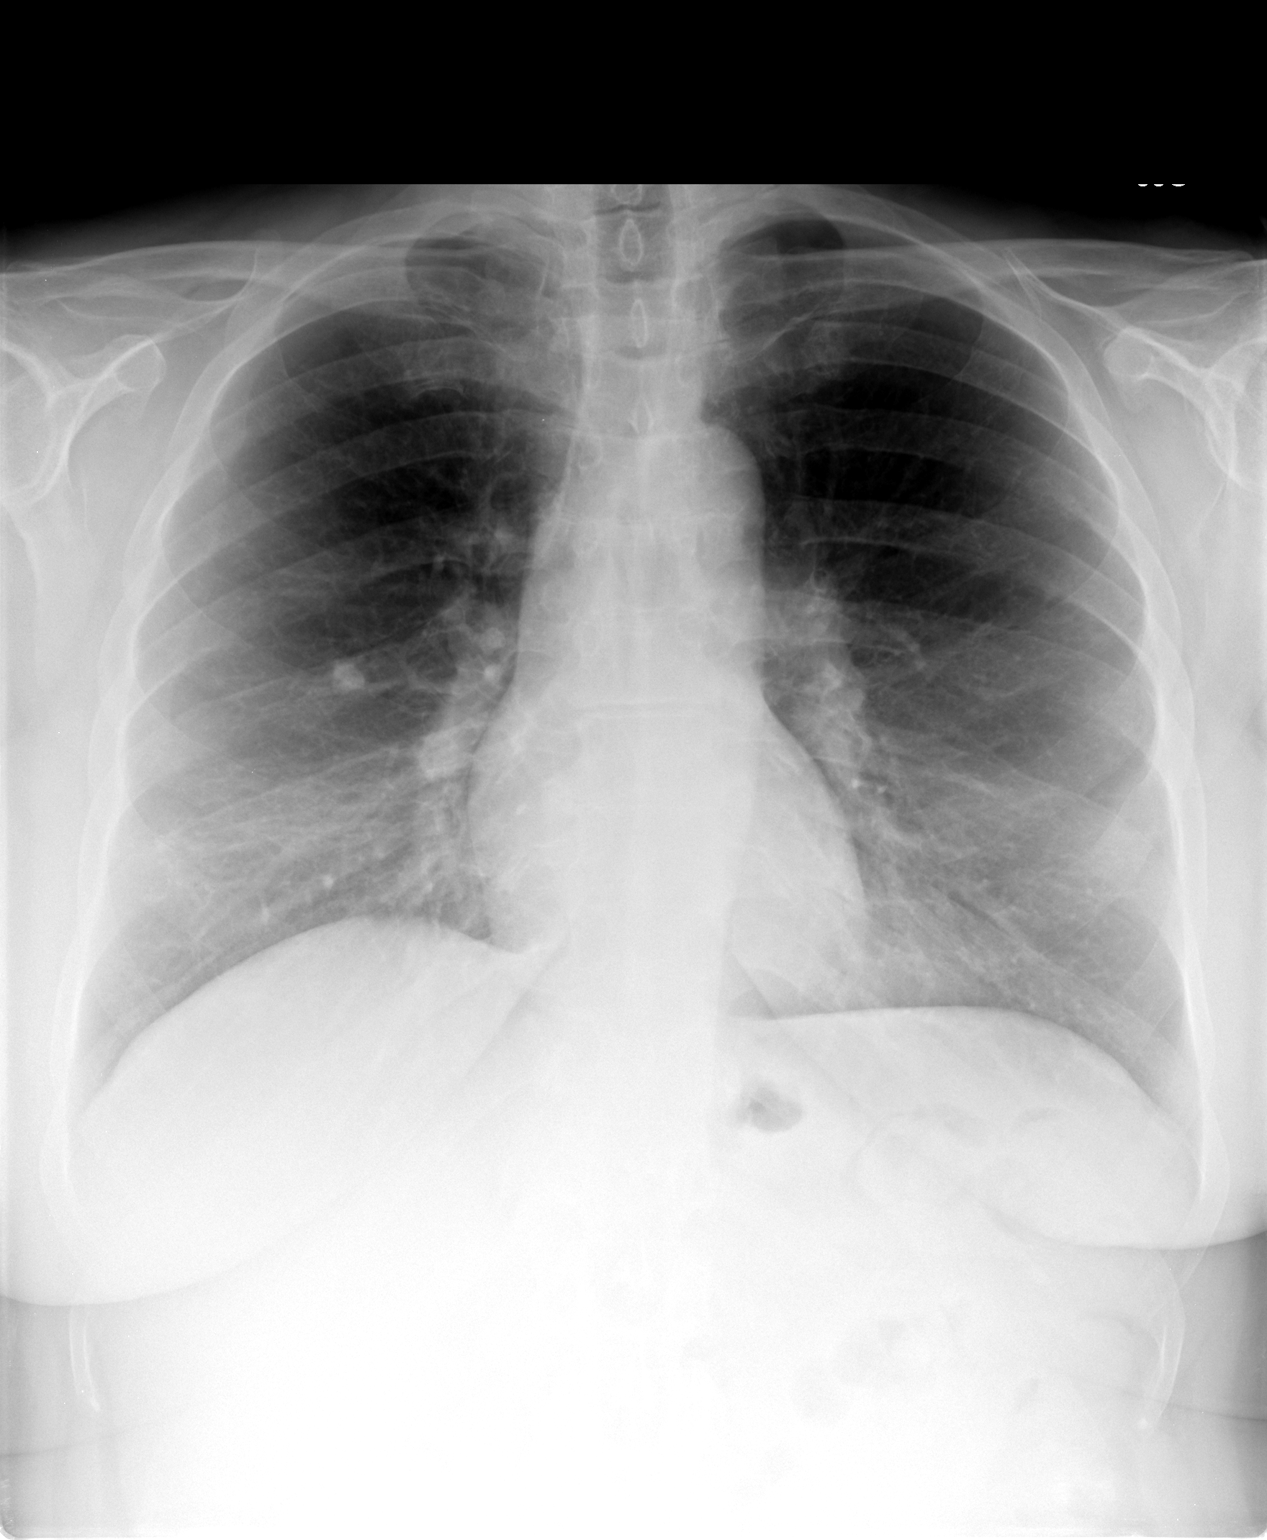

[view not recorded (2 of 2)]
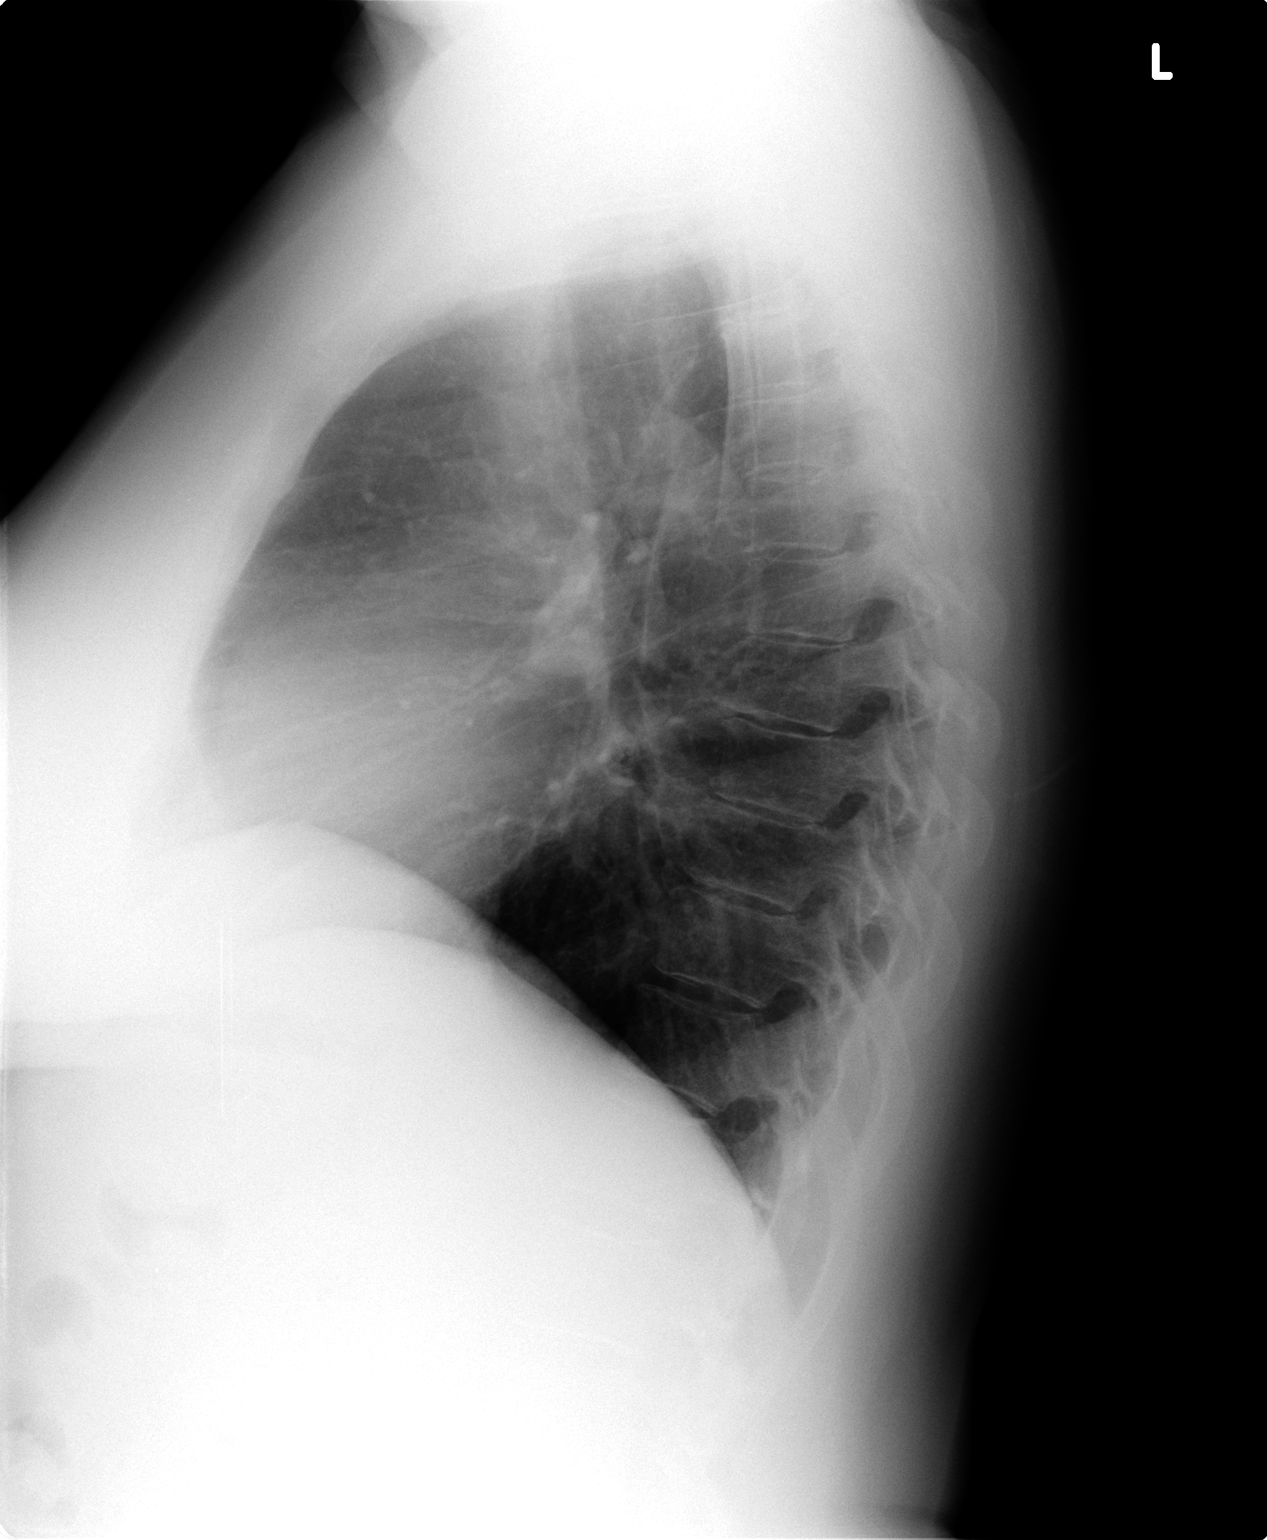

[2 of 2 positions shown; findings below may reference images not displayed]

FINDINGS: Stable probable calcified granuloma in the   right lower
lobe.  Lungs otherwise clear.  Heart size normal.  No effusion.
Visualized bones unremarkable.
IMPRESSION: No acute disease.

## 2012-02-06 ENCOUNTER — Telehealth: Payer: Self-pay | Admitting: Internal Medicine

## 2012-02-06 MED ORDER — AMPHETAMINE-DEXTROAMPHETAMINE 20 MG PO TABS: 20.0000 mg | ORAL_TABLET | Freq: Two times a day (BID) | ORAL | Status: DC

## 2012-02-06 NOTE — Telephone Encounter (Signed)
Pt needs new rxs generic adderall 20 mg. Pt is requesting separate rxs for next 3 months

## 2012-02-06 NOTE — Telephone Encounter (Signed)
SCRIPT GIVEN TO husband at his cpx today

## 2012-03-11 ENCOUNTER — Telehealth: Payer: Self-pay | Admitting: Internal Medicine

## 2012-03-11 NOTE — Telephone Encounter (Signed)
Caller: Izumi/Mother; Phone Number: [phone]; Message from caller: Pt states she wants rx switched back to Diovan. States it was initially changed due to cost and insurance wanting her to get generic.

## 2012-03-12 ENCOUNTER — Other Ambulatory Visit: Payer: Self-pay | Admitting: *Deleted

## 2012-03-12 DIAGNOSIS — I10 Essential (primary) hypertension: Secondary | ICD-10-CM

## 2012-03-12 MED ORDER — VALSARTAN-HYDROCHLOROTHIAZIDE 320-25 MG PO TABS: 1.0000 | ORAL_TABLET | Freq: Every day | ORAL | Status: DC

## 2012-03-12 NOTE — Telephone Encounter (Signed)
May resume previous diovan

## 2012-03-12 NOTE — Telephone Encounter (Signed)
Needed name brand only- with coupon will be cheaper-done

## 2012-03-15 ENCOUNTER — Other Ambulatory Visit: Payer: Self-pay | Admitting: *Deleted

## 2012-03-15 MED ORDER — BUPROPION HCL ER (XL) 300 MG PO TB24: 300.0000 mg | ORAL_TABLET | Freq: Every day | ORAL | Status: DC

## 2012-03-15 MED ORDER — FLUOXETINE HCL 20 MG PO CAPS: 20.0000 mg | ORAL_CAPSULE | Freq: Every day | ORAL | Status: DC

## 2012-04-20 ENCOUNTER — Other Ambulatory Visit: Payer: Self-pay | Admitting: Internal Medicine

## 2012-04-20 DIAGNOSIS — Z139 Encounter for screening, unspecified: Secondary | ICD-10-CM

## 2012-04-22 ENCOUNTER — Ambulatory Visit (HOSPITAL_COMMUNITY)
Admission: RE | Admit: 2012-04-22 | Discharge: 2012-04-22 | Disposition: A | Discharge status: Home / Self Care | Payer: Medicare Other | Source: Ambulatory Visit | Attending: Internal Medicine | Admitting: Internal Medicine

## 2012-04-22 DIAGNOSIS — Z139 Encounter for screening, unspecified: Secondary | ICD-10-CM

## 2012-04-22 DIAGNOSIS — Z1231 Encounter for screening mammogram for malignant neoplasm of breast: Secondary | ICD-10-CM | POA: Insufficient documentation

## 2012-05-11 ENCOUNTER — Telehealth: Payer: Self-pay | Admitting: Internal Medicine

## 2012-05-11 MED ORDER — AMPHETAMINE-DEXTROAMPHETAMINE 20 MG PO TABS: 20.0000 mg | ORAL_TABLET | Freq: Two times a day (BID) | ORAL | Status: DC

## 2012-05-11 NOTE — Telephone Encounter (Signed)
Printed and will cal pt to pick up when ready for pick up

## 2012-05-11 NOTE — Telephone Encounter (Signed)
Patient called stating that she need a refill of her adderall. Please assist.  °

## 2012-06-15 ENCOUNTER — Other Ambulatory Visit: Payer: Self-pay | Admitting: *Deleted

## 2012-06-15 MED ORDER — BUPROPION HCL ER (XL) 300 MG PO TB24: 300.0000 mg | ORAL_TABLET | Freq: Every day | ORAL | Status: DC

## 2012-06-15 MED ORDER — FLUOXETINE HCL 20 MG PO CAPS: 20.0000 mg | ORAL_CAPSULE | Freq: Every day | ORAL | Status: DC

## 2012-06-29 ENCOUNTER — Telehealth: Payer: Self-pay | Admitting: Internal Medicine

## 2012-06-29 NOTE — Telephone Encounter (Signed)
Caller: Mera/Patient; Patient Name: [name]; PCP: Darryll Capers (Adults only); Best Callback Phone Number: [phone]; Reason for call: Increased anxiety and wants to adjust her dose of Prozac and Wellbutrin. Onset approx 3months ago.  All emergent symptoms ruled out per Anxiety: Panic protocol with excpetion to 'Anxiety symptoms or panic episodes increasing in frequency or length'. See Provider in 72 hrs. Home care advice given.  Appt scheduled with Adline Mango PA 06/30/12 @ 0930.

## 2012-06-30 ENCOUNTER — Ambulatory Visit (INDEPENDENT_AMBULATORY_CARE_PROVIDER_SITE_OTHER): Payer: Medicare Other | Admitting: Family

## 2012-06-30 ENCOUNTER — Encounter: Payer: Self-pay | Admitting: Family

## 2012-06-30 VITALS — BP 130/90 | HR 96 | Temp 98.4°F | Wt 184.0 lb

## 2012-06-30 DIAGNOSIS — F329 Major depressive disorder, single episode, unspecified: Secondary | ICD-10-CM

## 2012-06-30 DIAGNOSIS — L748 Other eccrine sweat disorders: Secondary | ICD-10-CM | POA: Diagnosis not present

## 2012-06-30 DIAGNOSIS — F419 Anxiety disorder, unspecified: Secondary | ICD-10-CM

## 2012-06-30 DIAGNOSIS — Z23 Encounter for immunization: Secondary | ICD-10-CM | POA: Diagnosis not present

## 2012-06-30 DIAGNOSIS — L750 Bromhidrosis: Secondary | ICD-10-CM

## 2012-06-30 DIAGNOSIS — F411 Generalized anxiety disorder: Secondary | ICD-10-CM

## 2012-06-30 LAB — TSH: TSH: 0.1 u[IU]/mL — ABNORMAL LOW (ref 0.35–5.50)

## 2012-06-30 LAB — POCT URINALYSIS DIPSTICK
Glucose, UA: NEGATIVE
Nitrite, UA: NEGATIVE
Urobilinogen, UA: 0.2

## 2012-06-30 MED ORDER — FLUOXETINE HCL 40 MG PO CAPS: 40.0000 mg | ORAL_CAPSULE | Freq: Every day | ORAL | Status: DC

## 2012-06-30 NOTE — Patient Instructions (Signed)

## 2012-06-30 NOTE — Progress Notes (Signed)
Subjective:    Patient ID: [name], female    DOB: 1941-08-02, 71 y.o.   MRN: [id number]  HPI 71 year old white female, nonsmoker, patient of Dr. Lovell Sheehan is in today with complaints of increased anxiety and tension over the last 3 months. Reports a displeasure with lifes and feelings of helplessness and hopelessness. Denies any thoughts of death or dying. She has a great support system with her husband and children.  Patient would also like to have her urine checked. Reports a stronger urine than usual. Denies any burning with urination, blood in her urine, abdominal pain or back pain.  Review of Systems  Constitutional: Negative.   HENT: Negative.   Respiratory: Negative.   Cardiovascular: Negative.   Gastrointestinal: Negative.   Genitourinary: Negative.  Negative for dysuria, urgency, frequency and hematuria.  Musculoskeletal: Negative.   Skin: Negative.   Neurological: Negative.   Hematological: Negative.   Psychiatric/Behavioral: Positive for disturbed wake/sleep cycle. Negative for self-injury. The patient is nervous/anxious.    Past Medical History  Diagnosis Date  . HYPERLIPIDEMIA NEC/NOS 04/19/2007  . HYPERTENSION 04/07/2007  . ALLERGIC RHINITIS 11/30/2008  . ASTHMA UNSPECIFIED WITH EXACERBATION 02/17/2008  . Esophageal reflux 11/08/2008  . OSTEOARTHRITIS 04/07/2007  . OSTEOPENIA 03/10/2008  . INSOMNIA 08/07/2010  . CAROTID BRUIT, LEFT 04/19/2007  . CLOSED FRACTURE OF METATARSAL BONE 06/04/2009  . ALLERGIC RHINITIS 11/30/2008  . Closed fracture of lateral malleolus 06/04/2009  . GANGLION CYST 04/19/2007  . UTI 07/20/2009    History   Social History  . Marital Status: Married    Spouse Name: N/A    Number of Children: N/A  . Years of Education: N/A   Occupational History  . retired    Social History Main Topics  . Smoking status: Never Smoker   . Smokeless tobacco: Never Used   Comment: smoked 1.5 for  20 years  . Alcohol Use: Yes  . Drug Use: No  . Sexually  Active: Yes   Other Topics Concern  . Not on file   Social History Narrative  . No narrative on file    Past Surgical History  Procedure Date  . Tubal ligation   . Total hip arthroplasty   . Knuckle replacement     Family History  Problem Relation Age of Onset  . Heart disease Mother   . COPD Father   . Cancer Brother   . Alzheimer's disease Sister     No Known Allergies  Current Outpatient Prescriptions on File Prior to Visit  Medication Sig Dispense Refill  . amphetamine-dextroamphetamine (ADDERALL) 20 MG tablet Take 1 tablet (20 mg total) by mouth 2 (two) times daily.  60 tablet  0  . amphetamine-dextroamphetamine (ADDERALL) 20 MG tablet Take 1 tablet (20 mg total) by mouth 2 (two) times daily.  60 tablet  0  . aspirin-acetaminophen-caffeine (EXCEDRIN MIGRAINE) 250-250-65 MG per tablet Take 1 tablet by mouth every 6 (six) hours as needed.        Marland Kitchen buPROPion (WELLBUTRIN XL) 300 MG 24 hr tablet Take 1 tablet (300 mg total) by mouth daily.  90 tablet  3  . diclofenac sodium (VOLTAREN) 1 % GEL Apply 1 application topically 4 (four) times daily.  100 g  3  . Doxepin HCl (SILENOR) 6 MG TABS Take 1 tablet (6 mg total) by mouth at bedtime as needed. 1 at bedtime  30 tablet  3  . FLUoxetine (PROZAC) 20 MG capsule Take 1 capsule (20 mg total) by mouth daily.  90 capsule  3  . Multiple Vitamin (MULTIVITAMIN) capsule Take 1 capsule by mouth daily.        . Testosterone Propionate POWD APPLY 1/4 TEASPOONFUL    EXTERNALLY AS DIRECTEDEACH NIGHT AT BEDTIME.  5 g  3  . valsartan-hydrochlorothiazide (DIOVAN-HCT) 320-25 MG per tablet Take 1 tablet by mouth daily.  30 tablet  11    BP 130/90  Pulse 96  Temp 98.4 F (36.9 C) (Oral)  Wt 184 lb (83.462 kg)chart    Objective:   Physical Exam  Constitutional: She is oriented to person, place, and time. She appears well-developed and well-nourished.  Neck: Normal range of motion. Neck supple.  Cardiovascular: Normal rate, regular rhythm  and normal heart sounds.   Pulmonary/Chest: Effort normal.  Abdominal: Soft. Bowel sounds are normal.  Neurological: She is alert and oriented to person, place, and time.  Skin: Skin is warm and dry.  Psychiatric: She has a normal mood and affect.          Assessment & Plan:  Assessment: Anxiety-uncontrolled depression-uncontrolled, urinary odor  Plan: Increase Prozac to 40 mg daily. Continue Wellbutrin 300 mg once daily. We'll consider increasing the Wellbutrin at her next office visit she still has difficulty with anxiety and depression. Obtain UA and will notify patient pending results.

## 2012-07-28 ENCOUNTER — Encounter: Payer: Self-pay | Admitting: *Deleted

## 2012-07-30 ENCOUNTER — Telehealth: Payer: Self-pay | Admitting: Internal Medicine

## 2012-07-30 NOTE — Telephone Encounter (Signed)
Caller: [name]/Patient; Phone: [phone]; Reason for Call: Pt.  Was increased from long standing dose of 20 mg Prozac to 40 mg Prozac on 06/30/12 for anxiety which much improvement noted.  Has learned that Prozac is availalbe in 30 mg and would like next refill which is due now to be for 30 mg so that if in future she needs the dose incresed again she has that option.  Please call Prozac 30 mg if approved to [facility] at [phone].

## 2012-08-02 ENCOUNTER — Other Ambulatory Visit: Payer: Self-pay | Admitting: *Deleted

## 2012-08-02 MED ORDER — FLUOXETINE HCL 10 MG PO CAPS: ORAL_CAPSULE | ORAL | Status: DC

## 2012-08-02 NOTE — Telephone Encounter (Signed)
Talked with pharmacist- no 30 mg avaiable- pt would like 3 of the 10s-sent to pharmacy;

## 2012-08-02 NOTE — Telephone Encounter (Signed)
done

## 2012-08-16 ENCOUNTER — Telehealth: Payer: Self-pay | Admitting: Internal Medicine

## 2012-08-16 NOTE — Telephone Encounter (Signed)
Pt needs new rx generic adderall 20 mg °

## 2012-08-17 ENCOUNTER — Other Ambulatory Visit: Payer: Self-pay | Admitting: *Deleted

## 2012-08-17 MED ORDER — AMPHETAMINE-DEXTROAMPHETAMINE 20 MG PO TABS: 20.0000 mg | ORAL_TABLET | Freq: Two times a day (BID) | ORAL | Status: DC

## 2012-08-17 NOTE — Telephone Encounter (Signed)
Printed and will call pt to pick up after dr jenkins signs 

## 2012-09-09 ENCOUNTER — Telehealth: Payer: Self-pay | Admitting: Internal Medicine

## 2012-09-09 NOTE — Telephone Encounter (Signed)
Caller: Raeghan/Patient; Phone: [phone]; Reason for Call: Pt would like to discuss the possibility of being referred to specialist with Dr Lovell Sheehan.  She mentioned having tingling sensations in neck and face at previous visit, MD suggested was her blood sugar.  She has had several more of these episodes, last one on Christmas Eve.  Please call her at your earliest convenience to discuss and advise.

## 2012-09-14 NOTE — Telephone Encounter (Signed)
Could refer to neurologist to r/o TIA?

## 2012-09-16 NOTE — Telephone Encounter (Signed)
Left message on machine To call back about possible neuro referral 3 days ago,but pt never returned call

## 2012-10-27 ENCOUNTER — Encounter: Payer: Medicare Other | Admitting: Internal Medicine

## 2012-10-27 ENCOUNTER — Telehealth: Payer: Self-pay | Admitting: Internal Medicine

## 2012-10-27 NOTE — Telephone Encounter (Signed)
Appointment given.

## 2012-10-27 NOTE — Telephone Encounter (Signed)
Patient was unable to come today due to weather fear and other extenuating circumstances. Is there anywhere in the next month or so I can squeeze her?

## 2012-11-08 ENCOUNTER — Telehealth: Payer: Self-pay | Admitting: Internal Medicine

## 2012-11-08 MED ORDER — AMPHETAMINE-DEXTROAMPHETAMINE 20 MG PO TABS: 20.0000 mg | ORAL_TABLET | Freq: Two times a day (BID) | ORAL | Status: DC

## 2012-11-08 NOTE — Telephone Encounter (Signed)
Printed and will cal pt to pikc up after dr Lovell Sheehan

## 2012-11-08 NOTE — Telephone Encounter (Signed)
Patient called stating that she need a refill of her adderall 20 mg 1po bid. Please assist.

## 2012-11-17 ENCOUNTER — Other Ambulatory Visit (HOSPITAL_COMMUNITY)
Admission: RE | Admit: 2012-11-17 | Discharge: 2012-11-17 | Disposition: A | Discharge status: Home / Self Care | Payer: Medicare Other | Source: Ambulatory Visit | Attending: Internal Medicine | Admitting: Internal Medicine

## 2012-11-17 ENCOUNTER — Encounter: Payer: Self-pay | Admitting: Internal Medicine

## 2012-11-17 ENCOUNTER — Ambulatory Visit (INDEPENDENT_AMBULATORY_CARE_PROVIDER_SITE_OTHER): Payer: Medicare Other | Admitting: Internal Medicine

## 2012-11-17 VITALS — BP 130/86 | HR 80 | Temp 98.3°F | Resp 16 | Ht 64.0 in | Wt 179.0 lb

## 2012-11-17 DIAGNOSIS — Z0000 Encounter for general adult medical examination without abnormal findings: Secondary | ICD-10-CM | POA: Diagnosis not present

## 2012-11-17 DIAGNOSIS — F329 Major depressive disorder, single episode, unspecified: Secondary | ICD-10-CM

## 2012-11-17 DIAGNOSIS — T887XXA Unspecified adverse effect of drug or medicament, initial encounter: Secondary | ICD-10-CM

## 2012-11-17 DIAGNOSIS — I10 Essential (primary) hypertension: Secondary | ICD-10-CM | POA: Diagnosis not present

## 2012-11-17 DIAGNOSIS — Z01419 Encounter for gynecological examination (general) (routine) without abnormal findings: Secondary | ICD-10-CM

## 2012-11-17 DIAGNOSIS — K219 Gastro-esophageal reflux disease without esophagitis: Secondary | ICD-10-CM | POA: Diagnosis not present

## 2012-11-17 DIAGNOSIS — Z124 Encounter for screening for malignant neoplasm of cervix: Secondary | ICD-10-CM | POA: Insufficient documentation

## 2012-11-17 LAB — HEPATIC FUNCTION PANEL
Albumin: 3.9 g/dL (ref 3.5–5.2)
Alkaline Phosphatase: 88 U/L (ref 39–117)
Total Protein: 7.3 g/dL (ref 6.0–8.3)

## 2012-11-17 LAB — POCT URINALYSIS DIPSTICK
Spec Grav, UA: 1.025
Urobilinogen, UA: 0.2

## 2012-11-17 LAB — BASIC METABOLIC PANEL
BUN: 28 mg/dL — ABNORMAL HIGH (ref 6–23)
CO2: 28 mEq/L (ref 19–32)
Calcium: 10.2 mg/dL (ref 8.4–10.5)
Creatinine, Ser: 1.2 mg/dL (ref 0.4–1.2)
GFR: 48.21 mL/min — ABNORMAL LOW (ref >60.00)
Glucose, Bld: 98 mg/dL (ref 70–99)
Sodium: 137 mEq/L (ref 135–145)

## 2012-11-17 LAB — LIPID PANEL
Cholesterol: 206 mg/dL — ABNORMAL HIGH (ref 0–200)
HDL: 73.9 mg/dL (ref >39.00)
Triglycerides: 67 mg/dL (ref 0.0–149.0)
VLDL: 13.4 mg/dL (ref 0.0–40.0)

## 2012-11-17 LAB — CBC WITH DIFFERENTIAL/PLATELET
Basophils Relative: 0.7 % (ref 0.0–3.0)
Eosinophils Absolute: 0.1 10*3/uL (ref 0.0–0.7)
Eosinophils Relative: 0.7 % (ref 0.0–5.0)
HCT: 44.9 % (ref 36.0–46.0)
Lymphs Abs: 3.4 10*3/uL (ref 0.7–4.0)
MCHC: 33.1 g/dL (ref 30.0–36.0)
MCV: 86.2 fl (ref 78.0–100.0)
Monocytes Absolute: 0.8 10*3/uL (ref 0.1–1.0)
Platelets: 376 10*3/uL (ref 150.0–400.0)
WBC: 11.8 10*3/uL — ABNORMAL HIGH (ref 4.5–10.5)

## 2012-11-17 LAB — TSH: TSH: 0.13 u[IU]/mL — ABNORMAL LOW (ref 0.35–5.50)

## 2012-11-17 NOTE — Patient Instructions (Signed)
Mammogram A mammogram is an X-ray test to find changes in a woman's breast. You should get a mammogram if:  You are over 73 years of age.   You have risk factors.   Your doctor recommends that you have one.  BEFORE THE TEST  Do not schedule the test the week before your period, especially if your breasts are sore during this time.  On the day of your mammogram:  Wash your breasts and armpits well. After washing, do not put on any deodorant or talcum powder on until after your test.   Eat and drink as you usually do.   Take your medicines as usual.   If you are diabetic and take insulin, make sure you:   Eat before coming for your test.   Take your insulin as usual.   If you cannot keep your appointment, call before the appointment to cancel. Schedule another appointment.  TEST  You will need to undress from the waist up. You will put on a hospital gown.   Your breast will be put on the mammogram machine, and it will press firmly on your breast with a piece of plastic called a compression paddle. This will make your breast flatter so that the machine can X-ray all parts of your breast.   Both breasts will be X-rayed. Each breast will be X-rayed from above and from the side. An X-ray might need to be taken again if the picture is not good enough.   The mammogram will last about 15 to 30 minutes.  AFTER THE TEST Finding out the results of your test Ask when your test results will be ready. Make sure you get your test results. Document Released: 11/28/2008 Document Revised: 08/21/2011 Document Reviewed: 11/28/2008 [employer] Patient Information 2012 [employer]. 

## 2012-11-17 NOTE — Addendum Note (Signed)
Addended by: Willy Eddy on: 11/17/2012 02:05 PM   Modules accepted: Orders

## 2012-11-17 NOTE — Progress Notes (Signed)
Subjective:    Patient ID: [name], female    DOB: 1940-10-26, 72 y.o.   MRN: [id number]  HPI Patient is followed for Medicare wellness examination presents today for chronic followup allergic rhinitis mild asthma history of GERD history of hyperlipidemia history of hypertension mild to moderate insomnia and mild to moderate depression responding to Prozac   Review of Systems  Constitutional: Negative for activity change, appetite change and fatigue.       Obesity  HENT: Negative for ear pain, congestion, neck pain, postnasal drip and sinus pressure.   Eyes: Negative for redness and visual disturbance.  Respiratory: Negative for cough, shortness of breath and wheezing.   Gastrointestinal: Negative for abdominal pain and abdominal distention.  Genitourinary: Negative for dysuria, frequency and menstrual problem.  Musculoskeletal: Negative for myalgias, joint swelling and arthralgias.  Skin: Negative for rash and wound.  Neurological: Negative for dizziness, weakness and headaches.  Hematological: Negative for adenopathy. Does not bruise/bleed easily.  Psychiatric/Behavioral: Negative for sleep disturbance and decreased concentration.   Past Medical History  Diagnosis Date  . HYPERLIPIDEMIA NEC/NOS 04/19/2007  . HYPERTENSION 04/07/2007  . ALLERGIC RHINITIS 11/30/2008  . ASTHMA UNSPECIFIED WITH EXACERBATION 02/17/2008  . Esophageal reflux 11/08/2008  . OSTEOARTHRITIS 04/07/2007  . OSTEOPENIA 03/10/2008  . INSOMNIA 08/07/2010  . CAROTID BRUIT, LEFT 04/19/2007  . CLOSED FRACTURE OF METATARSAL BONE 06/04/2009  . ALLERGIC RHINITIS 11/30/2008  . Closed fracture of lateral malleolus 06/04/2009  . GANGLION CYST 04/19/2007  . UTI 07/20/2009    History   Social History  . Marital Status: Married    Spouse Name: N/A    Number of Children: N/A  . Years of Education: N/A   Occupational History  . retired    Social History Main Topics  . Smoking status: Never Smoker   . Smokeless  tobacco: Never Used     Comment: smoked 1.5 for  20 years  . Alcohol Use: Yes  . Drug Use: No  . Sexually Active: Yes   Other Topics Concern  . Not on file   Social History Narrative  . No narrative on file    Past Surgical History  Procedure Laterality Date  . Tubal ligation    . Total hip arthroplasty    . Knuckle replacement      Family History  Problem Relation Age of Onset  . Heart disease Mother   . COPD Father   . Cancer Brother   . Alzheimer's disease Sister     No Known Allergies  Current Outpatient Prescriptions on File Prior to Visit  Medication Sig Dispense Refill  . amphetamine-dextroamphetamine (ADDERALL) 20 MG tablet Take 1 tablet (20 mg total) by mouth 2 (two) times daily.  60 tablet  0  . amphetamine-dextroamphetamine (ADDERALL) 20 MG tablet Take 1 tablet (20 mg total) by mouth 2 (two) times daily.  60 tablet  0  . aspirin-acetaminophen-caffeine (EXCEDRIN MIGRAINE) 250-250-65 MG per tablet Take 1 tablet by mouth every 6 (six) hours as needed.        Marland Kitchen buPROPion (WELLBUTRIN XL) 300 MG 24 hr tablet Take 1 tablet (300 mg total) by mouth daily.  90 tablet  3  . diclofenac sodium (VOLTAREN) 1 % GEL Apply 1 application topically 4 (four) times daily.  100 g  3  . Doxepin HCl (SILENOR) 6 MG TABS Take 1 tablet (6 mg total) by mouth at bedtime as needed. 1 at bedtime  30 tablet  3  . FLUoxetine (PROZAC) 10 MG  capsule Take 3 caps daily for a total of 30 mg  90 capsule  6  . Multiple Vitamin (MULTIVITAMIN) capsule Take 1 capsule by mouth daily.        . Testosterone Propionate POWD APPLY 1/4 TEASPOONFUL    EXTERNALLY AS DIRECTEDEACH NIGHT AT BEDTIME.  5 g  3  . valsartan-hydrochlorothiazide (DIOVAN-HCT) 320-25 MG per tablet Take 1 tablet by mouth daily.  30 tablet  11   No current facility-administered medications on file prior to visit.    BP 130/86  Pulse 80  Temp(Src) 98.3 F (36.8 C)  Resp 16  Ht 5\' 4"  (1.626 m)  Wt 179 lb (81.194 kg)  BMI 30.71  kg/m2       Objective:   Physical Exam  Nursing note and vitals reviewed. Constitutional: She is oriented to person, place, and time. She appears well-developed and well-nourished. No distress.  HENT:  Head: Normocephalic and atraumatic.  Right Ear: External ear normal.  Left Ear: External ear normal.  Nose: Nose normal.  Mouth/Throat: Oropharynx is clear and moist.  Eyes: Conjunctivae and EOM are normal. Pupils are equal, round, and reactive to light.  Neck: Normal range of motion. Neck supple. No JVD present. No tracheal deviation present. No thyromegaly present.  Cardiovascular: Normal rate, regular rhythm and intact distal pulses.   Murmur heard. Pulmonary/Chest: Effort normal. She has no wheezes. She exhibits no tenderness.  Increase bronchial breath sounds   Abdominal: Soft. Bowel sounds are normal.  Musculoskeletal: Normal range of motion. She exhibits edema and tenderness.  Lymphadenopathy:    She has no cervical adenopathy.  Neurological: She is alert and oriented to person, place, and time. She has normal reflexes. No cranial nerve deficit.  Skin: Skin is warm and dry. She is not diaphoretic.  Psychiatric: She has a normal mood and affect. Her behavior is normal.          Assessment & Plan:  Patient is a 72 year old female who presents for Medicare wellness examination followup of chronic medical problems including her screening for her GYN she will require a breast examination today as well as a Pap and pelvic  Patient has stable allergic rhinitis with a mild bronchial cough her asthma is stable.  She has prolonged depression which has responded to increased dose of Prozac She's history of GERD but since she has been on the Prozac her reflux has improved hypertension is stable on current medications Subjective:    [name] is a 72 y.o. female who presents for Medicare Annual/Subsequent preventive examination.  Preventive Screening-Counseling &  Management  Tobacco History  Smoking status  . Never Smoker   Smokeless tobacco  . Never Used    Comment: smoked 1.5 for  20 years     Problems Prior to Visit 1.   Current Problems (verified) Patient Active Problem List  Diagnosis  . HYPERLIPIDEMIA NEC/NOS  . HYPERTENSION  . ALLERGIC RHINITIS  . ASTHMA UNSPECIFIED WITH EXACERBATION  . OSTEOARTHRITIS  . OSTEOPENIA  . INSOMNIA  . CAROTID BRUIT, LEFT  . Depression, prolonged  . GERD (gastroesophageal reflux disease)    Medications Prior to Visit Current Outpatient Prescriptions on File Prior to Visit  Medication Sig Dispense Refill  . amphetamine-dextroamphetamine (ADDERALL) 20 MG tablet Take 1 tablet (20 mg total) by mouth 2 (two) times daily.  60 tablet  0  . amphetamine-dextroamphetamine (ADDERALL) 20 MG tablet Take 1 tablet (20 mg total) by mouth 2 (two) times daily.  60 tablet  0  .  aspirin-acetaminophen-caffeine (EXCEDRIN MIGRAINE) 250-250-65 MG per tablet Take 1 tablet by mouth every 6 (six) hours as needed.        Marland Kitchen buPROPion (WELLBUTRIN XL) 300 MG 24 hr tablet Take 1 tablet (300 mg total) by mouth daily.  90 tablet  3  . diclofenac sodium (VOLTAREN) 1 % GEL Apply 1 application topically 4 (four) times daily.  100 g  3  . Doxepin HCl (SILENOR) 6 MG TABS Take 1 tablet (6 mg total) by mouth at bedtime as needed. 1 at bedtime  30 tablet  3  . FLUoxetine (PROZAC) 10 MG capsule Take 3 caps daily for a total of 30 mg  90 capsule  6  . Multiple Vitamin (MULTIVITAMIN) capsule Take 1 capsule by mouth daily.        . Testosterone Propionate POWD APPLY 1/4 TEASPOONFUL    EXTERNALLY AS DIRECTEDEACH NIGHT AT BEDTIME.  5 g  3  . valsartan-hydrochlorothiazide (DIOVAN-HCT) 320-25 MG per tablet Take 1 tablet by mouth daily.  30 tablet  11   No current facility-administered medications on file prior to visit.    Current Medications (verified) Current Outpatient Prescriptions  Medication Sig Dispense Refill  .  amphetamine-dextroamphetamine (ADDERALL) 20 MG tablet Take 1 tablet (20 mg total) by mouth 2 (two) times daily.  60 tablet  0  . amphetamine-dextroamphetamine (ADDERALL) 20 MG tablet Take 1 tablet (20 mg total) by mouth 2 (two) times daily.  60 tablet  0  . aspirin-acetaminophen-caffeine (EXCEDRIN MIGRAINE) 250-250-65 MG per tablet Take 1 tablet by mouth every 6 (six) hours as needed.        Marland Kitchen buPROPion (WELLBUTRIN XL) 300 MG 24 hr tablet Take 1 tablet (300 mg total) by mouth daily.  90 tablet  3  . diclofenac sodium (VOLTAREN) 1 % GEL Apply 1 application topically 4 (four) times daily.  100 g  3  . Doxepin HCl (SILENOR) 6 MG TABS Take 1 tablet (6 mg total) by mouth at bedtime as needed. 1 at bedtime  30 tablet  3  . FLUoxetine (PROZAC) 10 MG capsule Take 3 caps daily for a total of 30 mg  90 capsule  6  . Multiple Vitamin (MULTIVITAMIN) capsule Take 1 capsule by mouth daily.        . Testosterone Propionate POWD APPLY 1/4 TEASPOONFUL    EXTERNALLY AS DIRECTEDEACH NIGHT AT BEDTIME.  5 g  3  . valsartan-hydrochlorothiazide (DIOVAN-HCT) 320-25 MG per tablet Take 1 tablet by mouth daily.  30 tablet  11   No current facility-administered medications for this visit.     Allergies (verified) Review of patient's allergies indicates no known allergies.   PAST HISTORY  Family History Family History  Problem Relation Age of Onset  . Heart disease Mother   . COPD Father   . Cancer Brother   . Alzheimer's disease Sister     Social History History  Substance Use Topics  . Smoking status: Never Smoker   . Smokeless tobacco: Never Used     Comment: smoked 1.5 for  20 years  . Alcohol Use: Yes     Are there smokers in your home (other than you)? No  Risk Factors Current exercise habits: The patient does not participate in regular exercise at present.  Dietary issues discussed: obesity   Cardiac risk factors: advanced age (older than 38 for men, 93 for women), hypertension, obesity (BMI >=  30 kg/m2) and sedentary lifestyle.  Depression Screen (Note: if answer to either of  the following is "Yes", a more complete depression screening is indicated)   Over the past two weeks, have you felt down, depressed or hopeless? Yes  Over the past two weeks, have you felt little interest or pleasure in doing things? Yes  Have you lost interest or pleasure in daily life? Yes  Do you often feel hopeless? No  Do you cry easily over simple problems? No  Activities of Daily Living In your present state of health, do you have any difficulty performing the following activities?:  Driving? No Managing money?  No Feeding yourself? No Getting from bed to chair? No Climbing a flight of stairs? No Preparing food and eating?: No Bathing or showering? No Getting dressed: No Getting to the toilet? No Using the toilet:No Moving around from place to place: No In the past year have you fallen or had a near fall?:No   Are you sexually active?  Yes  Do you have more than one partner?  No  Hearing Difficulties: No Do you often ask people to speak up or repeat themselves? No Do you experience ringing or noises in your ears? No Do you have difficulty understanding soft or whispered voices? No   Do you feel that you have a problem with memory? No  Do you often misplace items? No  Do you feel safe at home?  Yes  Cognitive Testing  Alert? Yes  Normal Appearance?Yes  Oriented to person? Yes  Place? Yes   Time? Yes  Recall of three objects?  Yes  Can perform simple calculations? Yes  Displays appropriate judgment?Yes  Can read the correct time from a watch face?Yes   Advanced Directives have been discussed with the patient? Yes  List the Names of Other Physician/Practitioners you currently use: 1.    Indicate any recent Medical Services you may have received from other than [facility] providers in the past year (date may be approximate).  Immunization History  Administered Date(s) Administered   . Influenza Split 07/17/2011, 06/30/2012  . Influenza Whole 07/20/2007, 06/09/2008, 07/20/2009, 08/22/2010  . Pneumococcal Polysaccharide 09/16/2003, 10/16/2011  . Td 09/15/2005    Screening Tests Health Maintenance  Topic Date Due  . Colonoscopy  01/02/1990  . Zostavax  01/03/2000  . Influenza Vaccine  05/16/2013  . Tetanus/tdap  09/16/2015  . Pneumococcal Polysaccharide Vaccine Age 27 And Over  Completed    All answers were reviewed with the patient and necessary referrals were made:  Carrie Mew, MD   11/17/2012   History reviewed: allergies, current medications, past family history, past medical history, past social history, past surgical history and problem list  Review of Systems Pertinent items are noted in HPI.    Objective:     Vision by Snellen chart: right eye:20/20, left eye:20/20  Body mass index is 30.71 kg/(m^2). BP 130/86  Pulse 80  Temp(Src) 98.3 F (36.8 C)  Resp 16  Ht 5\' 4"  (1.626 m)  Wt 179 lb (81.194 kg)  BMI 30.71 kg/m2 See problem focused exam     Assessment:     This is a routine physical examination for this healthy  Female. Reviewed all health maintenance protocols including mammography colonoscopy bone density and reviewed appropriate screening labs. Her immunization history was reviewed as well as her current medications and allergies refills of her chronic medications were given and the plan for yearly health maintenance was discussed all orders and referrals were made as appropriate.      Plan:     During the course  of the visit the patient was educated and counseled about appropriate screening and preventive services including:    Pneumococcal vaccine   Influenza vaccine  Screening mammography  Diet review for nutrition referral? Yes ____  Not Indicated ____   Patient Instructions (the written plan) was given to the patient.  Medicare Attestation I have personally reviewed: The patient's medical and social  history Their use of alcohol, tobacco or illicit drugs Their current medications and supplements The patient's functional ability including ADLs,fall risks, home safety risks, cognitive, and hearing and visual impairment Diet and physical activities Evidence for depression or mood disorders  The patient's weight, height, BMI, and visual acuity have been recorded in the chart.  I have made referrals, counseling, and provided education to the patient based on review of the above and I have provided the patient with a written personalized care plan for preventive services.     Carrie Mew, MD   11/17/2012

## 2012-12-06 ENCOUNTER — Telehealth: Payer: Self-pay | Admitting: Internal Medicine

## 2012-12-06 NOTE — Telephone Encounter (Signed)
Pharmacy will cal pt and let her k now it is ready

## 2012-12-06 NOTE — Telephone Encounter (Signed)
Dr Lovell Sheehan states he gave pt a script for adderall for 11/2 pill prn in the afternnon prn

## 2012-12-06 NOTE — Telephone Encounter (Signed)
[name] w/ [facility] in [place] called concerning the RX for ADDERALL. Pt has a second script for her to take 1/2 pill in the afternoon, in addition to the other one. Pharm needs to verify this. [phone]

## 2012-12-06 NOTE — Telephone Encounter (Signed)
Pt would like bonnye to return her call concerning adderall

## 2012-12-07 ENCOUNTER — Telehealth: Payer: Self-pay | Admitting: Internal Medicine

## 2012-12-07 ENCOUNTER — Other Ambulatory Visit: Payer: Self-pay | Admitting: *Deleted

## 2012-12-07 MED ORDER — AMPHETAMINE-DEXTROAMPHETAMINE 20 MG PO TABS: ORAL_TABLET | ORAL | Status: DC

## 2012-12-07 NOTE — Telephone Encounter (Signed)
Pt is concerning insurance will not fill both of her scripts for ADDERALL for  the month.  Pharm recommend she combine both and pt would like to do this. Pt would like you to call her concerning this.  Pt also was inquiring about mailing her scripts in the future, due to distance.

## 2012-12-07 NOTE — Telephone Encounter (Signed)
Pt will use meds she has and when they are used will give 3 new scripts for #75.m pt informed- will be ready on 12/13/12

## 2013-02-05 ENCOUNTER — Ambulatory Visit (INDEPENDENT_AMBULATORY_CARE_PROVIDER_SITE_OTHER): Payer: Medicare Other | Admitting: Family Medicine

## 2013-02-05 ENCOUNTER — Encounter: Payer: Self-pay | Admitting: Family Medicine

## 2013-02-05 VITALS — BP 140/84 | HR 80 | Temp 97.9°F | Wt 172.0 lb

## 2013-02-05 DIAGNOSIS — L03213 Periorbital cellulitis: Secondary | ICD-10-CM

## 2013-02-05 DIAGNOSIS — T7840XA Allergy, unspecified, initial encounter: Secondary | ICD-10-CM

## 2013-02-05 DIAGNOSIS — H00039 Abscess of eyelid unspecified eye, unspecified eyelid: Secondary | ICD-10-CM | POA: Diagnosis not present

## 2013-02-05 MED ORDER — PREDNISONE 20 MG PO TABS: ORAL_TABLET | ORAL | Status: DC

## 2013-02-05 MED ORDER — AMOXICILLIN-POT CLAVULANATE 875-125 MG PO TABS: 1.0000 | ORAL_TABLET | Freq: Two times a day (BID) | ORAL | Status: DC

## 2013-02-05 NOTE — Patient Instructions (Addendum)
Get LORATIDINE TABLETS, TAKE 1 IN THE MORNING BENADRYL TABLETS, TAKE 1-2 TABS AT NIGHT

## 2013-02-05 NOTE — Progress Notes (Signed)
Date:  02/05/2013   Name:  [name]   DOB:  06-07-1941   MRN:  [id number] Gender: female Age: 72 y.o.  Primary Physician:  Carrie Mew, MD  Evaluating MD: Hannah Beat, MD   Chief Complaint: Eye Problem   History of Present Illness:  [name] is a 72 y.o. pleasant patient who presents with the following:  R eye very swollen, was a little bit puffy and thought retaining fluid and this morning was "closed." Itches like crazy. It is also mildly warm and red. This morning it was swollen shut, but she can see now and move eye. Highly allergic to poison ivy and oak. Never had anything like this.   Patient Active Problem List   Diagnosis Date Noted  . GERD (gastroesophageal reflux disease) 11/17/2012  . Depression, prolonged 10/28/2010  . INSOMNIA 08/07/2010  . ALLERGIC RHINITIS 11/30/2008  . OSTEOPENIA 03/10/2008  . ASTHMA UNSPECIFIED WITH EXACERBATION 02/17/2008  . HYPERLIPIDEMIA NEC/NOS 04/19/2007  . CAROTID BRUIT, LEFT 04/19/2007  . HYPERTENSION 04/07/2007  . OSTEOARTHRITIS 04/07/2007    Past Medical History  Diagnosis Date  . HYPERLIPIDEMIA NEC/NOS 04/19/2007  . HYPERTENSION 04/07/2007  . ALLERGIC RHINITIS 11/30/2008  . ASTHMA UNSPECIFIED WITH EXACERBATION 02/17/2008  . Esophageal reflux 11/08/2008  . OSTEOARTHRITIS 04/07/2007  . OSTEOPENIA 03/10/2008  . INSOMNIA 08/07/2010  . CAROTID BRUIT, LEFT 04/19/2007  . CLOSED FRACTURE OF METATARSAL BONE 06/04/2009  . ALLERGIC RHINITIS 11/30/2008  . Closed fracture of lateral malleolus 06/04/2009  . GANGLION CYST 04/19/2007  . UTI 07/20/2009    Past Surgical History  Procedure Laterality Date  . Tubal ligation    . Total hip arthroplasty    . Knuckle replacement      History   Social History  . Marital Status: Married    Spouse Name: N/A    Number of Children: N/A  . Years of Education: N/A   Occupational History  . retired    Social History Main Topics  . Smoking status: Never Smoker   .  Smokeless tobacco: Never Used     Comment: smoked 1.5 for  20 years  . Alcohol Use: Yes  . Drug Use: No  . Sexually Active: Yes   Other Topics Concern  . Not on file   Social History Narrative  . No narrative on file    Family History  Problem Relation Age of Onset  . Heart disease Mother   . COPD Father   . Cancer Brother   . Alzheimer's disease Sister     No Known Allergies  Medication list has been reviewed and updated.  Outpatient Prescriptions Prior to Visit  Medication Sig Dispense Refill  . amphetamine-dextroamphetamine (ADDERALL) 20 MG tablet Take 1 bid and may have extra 1/2 qd prn  75 tablet  0  . aspirin-acetaminophen-caffeine (EXCEDRIN MIGRAINE) 250-250-65 MG per tablet Take 1 tablet by mouth every 6 (six) hours as needed.        Marland Kitchen buPROPion (WELLBUTRIN XL) 300 MG 24 hr tablet Take 1 tablet (300 mg total) by mouth daily.  90 tablet  3  . diclofenac sodium (VOLTAREN) 1 % GEL Apply 1 application topically 4 (four) times daily.  100 g  3  . FLUoxetine (PROZAC) 10 MG capsule Take 3 caps daily for a total of 30 mg  90 capsule  6  . Multiple Vitamin (MULTIVITAMIN) capsule Take 1 capsule by mouth daily.        . Testosterone Propionate POWD APPLY 1/4  TEASPOONFUL    EXTERNALLY AS DIRECTEDEACH NIGHT AT BEDTIME.  5 g  3  . valsartan-hydrochlorothiazide (DIOVAN-HCT) 320-25 MG per tablet Take 1 tablet by mouth daily.  30 tablet  11  . Doxepin HCl (SILENOR) 6 MG TABS Take 1 tablet (6 mg total) by mouth at bedtime as needed. 1 at bedtime  30 tablet  3   No facility-administered medications prior to visit.    Review of Systems:   GEN: No acute illnesses, no fevers, chills. GI: No n/v/d, eating normally Pulm: No SOB Interactive and getting along well at home.  Otherwise, ROS is as per the HPI.   Physical Examination: BP 140/84  Pulse 80  Temp(Src) 97.9 F (36.6 C) (Oral)  Wt 172 lb (78.019 kg)  BMI 29.51 kg/m2  Ideal Body Weight:     GEN: WDWN, NAD, Non-toxic,  Alert & Oriented x 3 HEENT: Atraumatic, Normocephalic. PERRLA, EOMI. R perorbital space and lids are swollen significantly. Mildly reddened and mildly warm to touch. Nontender. Small lateral break in skin. Ears and Nose: No external deformity. EXTR: No clubbing/cyanosis/edema SKIN: R temple and L forearm with appearance of allergic dermatitis with excoriation. NEURO: Normal gait.  PSYCH: Normally interactive. Conversant. Not depressed or anxious appearing.  Calm demeanor.    Assessment and Plan:  Periorbital cellulitis of right eye  Allergy to poison ivy, initial encounter  Mostly concerned with possible periorbital cellulitis with skin break, redness and warmth. Full movements of eye now. Strict precautions reviewed. To ER over weekend if worsens.   May be a component of poison ivy, and she does have some - give some prednisone, clairitin, and benadryl.  Orders Today:  No orders of the defined types were placed in this encounter.    Updated Medication List: (Includes new medications, updates to list, dose adjustments) Meds ordered this encounter  Medications  . amoxicillin-clavulanate (AUGMENTIN) 875-125 MG per tablet    Sig: Take 1 tablet by mouth 2 (two) times daily.    Dispense:  20 tablet    Refill:  0  . predniSONE (DELTASONE) 20 MG tablet    Sig: 2 po for 1 week, then 1 po for 1 week    Dispense:  21 tablet    Refill:  0    Medications Discontinued: Medications Discontinued During This Encounter  Medication Reason  . Doxepin HCl (SILENOR) 6 MG TABS Patient Preference      Signed, Tyleek Smick T. Cesily Cuoco, MD 02/05/2013 10:45 AM

## 2013-02-08 ENCOUNTER — Telehealth: Payer: Self-pay | Admitting: Internal Medicine

## 2013-02-08 NOTE — Telephone Encounter (Signed)
[facility] Triage Call Report Triage Record Num: [id number] Operator: Thayer Headings Patient Name: [name] Call Date & Time: 02/05/2013 9:08:05AM Patient Phone: [phone] PCP: Darryll Capers Patient Gender: Female PCP Fax : [phone] Patient DOB: [birth year] [facility] Reason for Call: Caller: [name]/Patient; PCP: Darryll Capers (Adults only); CB#: [phone]; Calling today 02/05/13 regarding her woke with right eye swollen closed. Said eye started itching yesterday. Eye is red all around the eye. Afebrile. No discharge. Emergent symptoms r/o by Eye Infection or Irritation guidelines with exception of worsening redness, swelling and tenderness of tissue around eyes associated with restricted or painful eye movements, bulging eye, or decreased vision related to swelling. (See Provider Within 4 Hours). Triage called office and got permission to schedule appt out of order by [facility] in office. Appt scheduled for today at 10:30 AM because of travel distance to office. Protocol(s) Used: Eye: Infection or Irritation Recommended Outcome per Protocol: See Provider within 4 hours Reason for Outcome: Worsening redness, swelling AND tenderness of tissue around eyes associated with restricted or painful eye movements, bulging eye, or decreased vision related to swelling Care Advice: ~ CAUTIONS Write down provider's name. List or place the following in a bag for transport with the patient: current prescription and/or nonprescription medications; alternative treatments, therapies and medications; and street drugs. ~ 05/

## 2013-03-08 ENCOUNTER — Other Ambulatory Visit: Payer: Self-pay | Admitting: *Deleted

## 2013-03-08 MED ORDER — BUPROPION HCL ER (XL) 300 MG PO TB24: 300.0000 mg | ORAL_TABLET | Freq: Every day | ORAL | Status: DC

## 2013-03-11 ENCOUNTER — Telehealth: Payer: Self-pay | Admitting: Internal Medicine

## 2013-03-11 MED ORDER — AMPHETAMINE-DEXTROAMPHETAMINE 20 MG PO TABS: ORAL_TABLET | ORAL | Status: DC

## 2013-03-11 NOTE — Telephone Encounter (Signed)
Pt needs new rx generic adderall 20 mg °

## 2013-03-11 NOTE — Telephone Encounter (Signed)
Scripts were printed and will call pt to pick up after dr Lovell Sheehan signs

## 2013-03-21 ENCOUNTER — Ambulatory Visit (INDEPENDENT_AMBULATORY_CARE_PROVIDER_SITE_OTHER): Payer: Medicare Other | Admitting: Internal Medicine

## 2013-03-21 ENCOUNTER — Encounter: Payer: Self-pay | Admitting: Internal Medicine

## 2013-03-21 VITALS — BP 130/84 | HR 76 | Temp 98.2°F | Resp 16 | Ht 64.0 in | Wt 176.0 lb

## 2013-03-21 DIAGNOSIS — R635 Abnormal weight gain: Secondary | ICD-10-CM

## 2013-03-21 DIAGNOSIS — R5381 Other malaise: Secondary | ICD-10-CM

## 2013-03-21 DIAGNOSIS — R5383 Other fatigue: Secondary | ICD-10-CM

## 2013-03-21 DIAGNOSIS — I10 Essential (primary) hypertension: Secondary | ICD-10-CM | POA: Diagnosis not present

## 2013-03-21 LAB — BASIC METABOLIC PANEL
Chloride: 102 mEq/L (ref 96–112)
Potassium: 4.1 mEq/L (ref 3.5–5.1)
Sodium: 139 mEq/L (ref 135–145)

## 2013-03-21 NOTE — Progress Notes (Signed)
  Subjective:    Patient ID: [name], female    DOB: 21-Oct-1940, 72 y.o.   MRN: [id number]  HPI  Weight gain and fatigue ADD The GERD has worsened and has been using TUMs   Review of Systems  Constitutional: Negative for activity change, appetite change and fatigue.  HENT: Negative for ear pain, congestion, neck pain, postnasal drip and sinus pressure.   Eyes: Negative for redness and visual disturbance.  Respiratory: Negative for cough, shortness of breath and wheezing.   Gastrointestinal: Negative for abdominal pain and abdominal distention.  Genitourinary: Negative for dysuria, frequency and menstrual problem.  Musculoskeletal: Negative for myalgias, joint swelling and arthralgias.  Skin: Negative for rash and wound.  Neurological: Negative for dizziness, weakness and headaches.  Hematological: Negative for adenopathy. Does not bruise/bleed easily.  Psychiatric/Behavioral: Negative for sleep disturbance and decreased concentration.       Objective:   Physical Exam  Nursing note and vitals reviewed. Constitutional: She is oriented to person, place, and time. She appears well-developed and well-nourished. No distress.  HENT:  Head: Normocephalic and atraumatic.  Mouth/Throat: Oropharynx is clear and moist.  Eyes: Conjunctivae and EOM are normal. Pupils are equal, round, and reactive to light.  Neck: Normal range of motion. Neck supple. No JVD present. No tracheal deviation present. No thyromegaly present.  Cardiovascular: Normal rate and regular rhythm.   Murmur heard. Pulmonary/Chest: Effort normal and breath sounds normal. She has no wheezes. She exhibits no tenderness.  Abdominal: Soft. Bowel sounds are normal.  Musculoskeletal: Normal range of motion. She exhibits no edema and no tenderness.  Lymphadenopathy:    She has no cervical adenopathy.  Neurological: She is alert and oriented to person, place, and time. She has normal reflexes. No cranial nerve deficit.   Skin: Skin is warm and dry. She is not diaphoretic.          Assessment & Plan:  Discussion of diet and exercise Paleo discussion Monitoring of blood work bmet Screening for fatigue

## 2013-03-22 LAB — T3, FREE: T3, Free: 2.8 pg/mL (ref 2.3–4.2)

## 2013-03-22 LAB — TSH: TSH: 0.05 u[IU]/mL — ABNORMAL LOW (ref 0.35–5.50)

## 2013-03-22 LAB — T4, FREE: Free T4: 0.89 ng/dL (ref 0.60–1.60)

## 2013-04-01 ENCOUNTER — Other Ambulatory Visit: Payer: Self-pay | Admitting: *Deleted

## 2013-04-01 DIAGNOSIS — I10 Essential (primary) hypertension: Secondary | ICD-10-CM

## 2013-04-01 MED ORDER — BUPROPION HCL ER (XL) 300 MG PO TB24: 300.0000 mg | ORAL_TABLET | Freq: Every day | ORAL | Status: DC

## 2013-04-01 MED ORDER — VALSARTAN-HYDROCHLOROTHIAZIDE 320-25 MG PO TABS: 1.0000 | ORAL_TABLET | Freq: Every day | ORAL | Status: DC

## 2013-04-01 MED ORDER — FLUOXETINE HCL 10 MG PO CAPS: ORAL_CAPSULE | ORAL | Status: DC

## 2013-04-10 ENCOUNTER — Other Ambulatory Visit: Payer: Self-pay | Admitting: Internal Medicine

## 2013-04-12 ENCOUNTER — Other Ambulatory Visit: Payer: Self-pay | Admitting: *Deleted

## 2013-04-12 MED ORDER — VALSARTAN-HYDROCHLOROTHIAZIDE 320-25 MG PO TABS: ORAL_TABLET | ORAL | Status: DC

## 2013-04-29 ENCOUNTER — Other Ambulatory Visit: Payer: Self-pay | Admitting: Internal Medicine

## 2013-05-05 ENCOUNTER — Other Ambulatory Visit: Payer: Self-pay | Admitting: *Deleted

## 2013-05-05 ENCOUNTER — Telehealth: Payer: Self-pay | Admitting: Internal Medicine

## 2013-05-05 NOTE — Telephone Encounter (Signed)
This was approved

## 2013-05-05 NOTE — Telephone Encounter (Signed)
Left message on machine at pharmacy Has been preauthorized

## 2013-05-05 NOTE — Telephone Encounter (Signed)
Message copied by Kathyrn Drown on Thu May 05, 2013  3:25 PM ------      Message from: Willy Eddy      Created: Wed Apr 13, 2013 11:59 AM       Talked with dr Lovell Sheehan- her prozac - only comes in 10,20, and 4o, and he has tried her on 38 and 31  And she does very good at 44, so he wants her on 3 of the 10mg             diovan - tried others and didn't work and made her feel "funny" ------

## 2013-05-26 ENCOUNTER — Telehealth: Payer: Self-pay | Admitting: Internal Medicine

## 2013-05-26 MED ORDER — AMPHETAMINE-DEXTROAMPHETAMINE 20 MG PO TABS: ORAL_TABLET | ORAL | Status: DC

## 2013-05-26 MED ORDER — ASPIRIN-ACETAMINOPHEN-CAFFEINE 250-250-65 MG PO TABS: 1.0000 | ORAL_TABLET | Freq: Four times a day (QID) | ORAL | Status: DC | PRN

## 2013-05-26 NOTE — Telephone Encounter (Signed)
Pt informed,scripts will be ready on Monday when dr Lovell Sheehan returns to sign them.

## 2013-05-26 NOTE — Telephone Encounter (Signed)
Pt would like to know if she could pick up her RX amphetamine-dextroamphetamine (ADDERALL) 20 MG tablet script next week b/c she is going out of town  And will not be here when she needs new rx. If you can do today, they are coming to [place] later this pm. pls inform pt if you can. If not, next week ok.

## 2013-06-08 ENCOUNTER — Other Ambulatory Visit: Payer: Self-pay | Admitting: Internal Medicine

## 2013-07-21 ENCOUNTER — Other Ambulatory Visit: Payer: Self-pay

## 2013-07-25 ENCOUNTER — Ambulatory Visit (INDEPENDENT_AMBULATORY_CARE_PROVIDER_SITE_OTHER): Payer: Medicare Other | Admitting: Internal Medicine

## 2013-07-25 ENCOUNTER — Encounter: Payer: Self-pay | Admitting: Internal Medicine

## 2013-07-25 VITALS — BP 145/80 | HR 76 | Temp 98.0°F | Resp 16 | Ht 64.0 in | Wt 178.0 lb

## 2013-07-25 DIAGNOSIS — G4700 Insomnia, unspecified: Secondary | ICD-10-CM | POA: Diagnosis not present

## 2013-07-25 DIAGNOSIS — Z23 Encounter for immunization: Secondary | ICD-10-CM | POA: Diagnosis not present

## 2013-07-25 MED ORDER — TRAZODONE HCL 50 MG PO TABS: 25.0000 mg | ORAL_TABLET | Freq: Every evening | ORAL | Status: DC | PRN

## 2013-07-25 NOTE — Patient Instructions (Signed)
Have to remove the scar tissue around the splinter and place a suture We will try trazodone and if the trazodone does not work effectively for sleep we will try Sonata as the next

## 2013-07-25 NOTE — Progress Notes (Signed)
Subjective:    Patient ID: [name], female    DOB: 05-Feb-1941, 72 y.o.   MRN: [id number]  HPI HTN and stress. Having issues with daughter-in-law Mood and depression   Review of Systems  Constitutional: Negative for activity change, appetite change and fatigue.  HENT: Negative for congestion, ear pain, postnasal drip and sinus pressure.   Eyes: Negative for redness and visual disturbance.  Respiratory: Negative for cough, shortness of breath and wheezing.   Gastrointestinal: Negative for abdominal pain and abdominal distention.  Genitourinary: Negative for dysuria, frequency and menstrual problem.  Musculoskeletal: Negative for arthralgias, joint swelling, myalgias and neck pain.  Skin: Negative for rash and wound.  Neurological: Negative for dizziness, weakness and headaches.  Hematological: Negative for adenopathy. Does not bruise/bleed easily.  Psychiatric/Behavioral: Negative for sleep disturbance and decreased concentration.   Past Medical History  Diagnosis Date  . HYPERLIPIDEMIA NEC/NOS 04/19/2007  . HYPERTENSION 04/07/2007  . ALLERGIC RHINITIS 11/30/2008  . ASTHMA UNSPECIFIED WITH EXACERBATION 02/17/2008  . Esophageal reflux 11/08/2008  . OSTEOARTHRITIS 04/07/2007  . OSTEOPENIA 03/10/2008  . INSOMNIA 08/07/2010  . CAROTID BRUIT, LEFT 04/19/2007  . CLOSED FRACTURE OF METATARSAL BONE 06/04/2009  . ALLERGIC RHINITIS 11/30/2008  . Closed fracture of lateral malleolus 06/04/2009  . GANGLION CYST 04/19/2007  . UTI 07/20/2009    History   Social History  . Marital Status: Married    Spouse Name: N/A    Number of Children: N/A  . Years of Education: N/A   Occupational History  . retired    Social History Main Topics  . Smoking status: Never Smoker   . Smokeless tobacco: Never Used     Comment: smoked 1.5 for  20 years  . Alcohol Use: Yes  . Drug Use: No  . Sexual Activity: Yes   Other Topics Concern  . Not on file   Social History Narrative  . No narrative on  file    Past Surgical History  Procedure Laterality Date  . Tubal ligation    . Total hip arthroplasty    . Knuckle replacement      Family History  Problem Relation Age of Onset  . Heart disease Mother   . COPD Father   . Cancer Brother   . Alzheimer's disease Sister     No Known Allergies  Current Outpatient Prescriptions on File Prior to Visit  Medication Sig Dispense Refill  . amphetamine-dextroamphetamine (ADDERALL) 20 MG tablet Take 1 bid and may have extra 1/2 qd prn  75 tablet  0  . aspirin-acetaminophen-caffeine (EXCEDRIN MIGRAINE) 250-250-65 MG per tablet Take 1 tablet by mouth every 6 (six) hours as needed.  30 tablet  0  . buPROPion (WELLBUTRIN XL) 300 MG 24 hr tablet Take 1 tablet (300 mg total) by mouth daily.  90 tablet  3  . FLUoxetine (PROZAC) 10 MG capsule TAKE THREE CAPSULES BY MOUTH EVERY DAY  270 capsule  3  . Multiple Vitamin (MULTIVITAMIN) capsule Take 1 capsule by mouth daily.        . valsartan-hydrochlorothiazide (DIOVAN-HCT) 320-25 MG per tablet TAKE ONE TABLET BY MOUTH EVERY DAY  90 tablet  3  . VOLTAREN 1 % GEL APPLY   TOPICALLY TO AFFECTED AREA 4 TIMES DAILY  100 g  11   No current facility-administered medications on file prior to visit.    BP 145/80  Pulse 76  Temp(Src) 98 F (36.7 C)  Resp 16  Ht 5\' 4"  (1.626 m)  Wt 178 lb (  80.74 kg)  BMI 30.54 kg/m2       Objective:   Physical Exam  Constitutional: She is oriented to person, place, and time. She appears well-developed and well-nourished. No distress.  HENT:  Head: Normocephalic and atraumatic.  Eyes: Conjunctivae and EOM are normal. Pupils are equal, round, and reactive to light.  Neck: Normal range of motion. Neck supple. No JVD present. No tracheal deviation present. No thyromegaly present.  Cardiovascular: Normal rate and regular rhythm.   No murmur heard. Pulmonary/Chest: Effort normal and breath sounds normal. She has no wheezes. She exhibits no tenderness.  Abdominal:  Soft. Bowel sounds are normal.  Musculoskeletal: Normal range of motion. She exhibits no edema and no tenderness.  Lymphadenopathy:    She has no cervical adenopathy.  Neurological: She is alert and oriented to person, place, and time. She has normal reflexes. No cranial nerve deficit.  Skin: Skin is warm and dry. She is not diaphoretic.  Psychiatric: She has a normal mood and affect. Her behavior is normal.          Assessment & Plan:  Mood and depression  Splinter in the finger  Insomnia Trial of trazadone

## 2013-07-25 NOTE — Progress Notes (Signed)
Pre-visit discussion using our clinic review tool. No additional management support is needed unless otherwise documented below in the visit note.  

## 2013-07-28 ENCOUNTER — Encounter: Payer: Self-pay | Admitting: Internal Medicine

## 2013-07-29 ENCOUNTER — Encounter: Payer: Self-pay | Admitting: Internal Medicine

## 2013-07-29 ENCOUNTER — Ambulatory Visit (INDEPENDENT_AMBULATORY_CARE_PROVIDER_SITE_OTHER): Payer: Medicare Other | Admitting: Internal Medicine

## 2013-07-29 VITALS — BP 136/86 | HR 85 | Temp 97.7°F | Ht 64.0 in | Wt 180.0 lb

## 2013-07-29 DIAGNOSIS — S60459A Superficial foreign body of unspecified finger, initial encounter: Secondary | ICD-10-CM | POA: Diagnosis not present

## 2013-07-29 NOTE — Patient Instructions (Signed)
Keep site dry and clean Monitor for bleeding or signs of infection return in one week for suture removal

## 2013-07-29 NOTE — Progress Notes (Signed)
Is a 72 year old female who presents for removal of a foreign object in her left thumb.  The patient had a splinter or a from a blackberry bush that stayed planted and her left thumb for over 2 months with scar tissue reaction around the splinter she was unable to remove it.  Site was prepared with Betadine anesthesia was obtained with 2% Xylocaine and using a #15 blade I removed an elliptical section of her palm that included the scar tissue.  We closed the wound with 4 4-0 Vicryl sutures She tolerated the procedure well

## 2013-07-29 NOTE — Progress Notes (Signed)
Pre visit review using our clinic review tool, if applicable. No additional management support is needed unless otherwise documented below in the visit note. 

## 2013-09-01 ENCOUNTER — Telehealth: Payer: Self-pay | Admitting: Internal Medicine

## 2013-09-01 NOTE — Telephone Encounter (Signed)
Pt request refill amphetamine-dextroamphetamine (ADDERALL) 20 MG tablet  Pt's finger is getting red, pt thinks it may be infected, appt made w/ padonda on Friday.

## 2013-09-02 ENCOUNTER — Ambulatory Visit (INDEPENDENT_AMBULATORY_CARE_PROVIDER_SITE_OTHER): Payer: Medicare Other | Admitting: Family

## 2013-09-02 ENCOUNTER — Encounter: Payer: Self-pay | Admitting: Family

## 2013-09-02 ENCOUNTER — Other Ambulatory Visit: Payer: Self-pay | Admitting: *Deleted

## 2013-09-02 VITALS — BP 140/80 | HR 114 | Wt 186.0 lb

## 2013-09-02 DIAGNOSIS — L0291 Cutaneous abscess, unspecified: Secondary | ICD-10-CM

## 2013-09-02 DIAGNOSIS — L0390 Cellulitis, unspecified: Secondary | ICD-10-CM

## 2013-09-02 MED ORDER — AMPHETAMINE-DEXTROAMPHETAMINE 20 MG PO TABS: ORAL_TABLET | ORAL | Status: DC

## 2013-09-02 MED ORDER — ASPIRIN-ACETAMINOPHEN-CAFFEINE 250-250-65 MG PO TABS: 1.0000 | ORAL_TABLET | Freq: Four times a day (QID) | ORAL | Status: DC | PRN

## 2013-09-02 MED ORDER — DOXYCYCLINE HYCLATE 100 MG PO TABS: 100.0000 mg | ORAL_TABLET | Freq: Two times a day (BID) | ORAL | Status: DC

## 2013-09-02 NOTE — Progress Notes (Signed)
Subjective:    Patient ID: [name], female    DOB: [birth year], 72 y.o.   MRN: [id number]  HPI  72 year old white female, nonsmoker, patient of Dr. Lovell Sheehan is in today with a cyst type formation of her distal left thumb that is worsened over the last 3 days. Patient reports initially getting a splinter in her finger last year that was removed by Dr. Lovell Sheehan. He later required an office surgery to remove the remainder of the splinter. It healed well. However, now,  its more red, tender, and inflamed.   Review of Systems  Constitutional: Negative.   Respiratory: Negative.   Cardiovascular: Negative.   Endocrine: Negative.   Skin:       Redness and swelling of the right thumb  Neurological: Negative.   Psychiatric/Behavioral: Negative.    Past Medical History  Diagnosis Date  . HYPERLIPIDEMIA NEC/NOS 04/19/2007  . HYPERTENSION 04/07/2007  . ALLERGIC RHINITIS 11/30/2008  . ASTHMA UNSPECIFIED WITH EXACERBATION 02/17/2008  . Esophageal reflux 11/08/2008  . OSTEOARTHRITIS 04/07/2007  . OSTEOPENIA 03/10/2008  . INSOMNIA 08/07/2010  . CAROTID BRUIT, LEFT 04/19/2007  . CLOSED FRACTURE OF METATARSAL BONE 06/04/2009  . ALLERGIC RHINITIS 11/30/2008  . Closed fracture of lateral malleolus 06/04/2009  . GANGLION CYST 04/19/2007  . UTI 07/20/2009    History   Social History  . Marital Status: Married    Spouse Name: N/A    Number of Children: N/A  . Years of Education: N/A   Occupational History  . retired    Social History Main Topics  . Smoking status: Never Smoker   . Smokeless tobacco: Never Used     Comment: smoked 1.5 for  20 years  . Alcohol Use: Yes  . Drug Use: No  . Sexual Activity: Yes   Other Topics Concern  . Not on file   Social History Narrative  . No narrative on file    Past Surgical History  Procedure Laterality Date  . Tubal ligation    . Total hip arthroplasty    . Knuckle replacement      Family History  Problem Relation Age of Onset  . Heart  disease Mother   . COPD Father   . Cancer Brother   . Alzheimer's disease Sister     Allergies  Allergen Reactions  . Cephalexin     Current Outpatient Prescriptions on File Prior to Visit  Medication Sig Dispense Refill  . buPROPion (WELLBUTRIN XL) 300 MG 24 hr tablet Take 1 tablet (300 mg total) by mouth daily.  90 tablet  3  . FLUoxetine (PROZAC) 10 MG capsule TAKE THREE CAPSULES BY MOUTH EVERY DAY  270 capsule  3  . Multiple Vitamin (MULTIVITAMIN) capsule Take 1 capsule by mouth daily.        . traZODone (DESYREL) 50 MG tablet Take 0.5-1 tablets (25-50 mg total) by mouth at bedtime as needed for sleep.  30 tablet  3  . valsartan-hydrochlorothiazide (DIOVAN-HCT) 320-25 MG per tablet TAKE ONE TABLET BY MOUTH EVERY DAY  90 tablet  3  . VOLTAREN 1 % GEL APPLY   TOPICALLY TO AFFECTED AREA 4 TIMES DAILY  100 g  11   No current facility-administered medications on file prior to visit.    BP 140/80  Pulse 114  Wt 186 lb (84.369 kg)chart    Objective:   Physical Exam  Constitutional: She is oriented to person, place, and time. She appears well-developed and well-nourished.  Neck: Normal range of motion.  Neck supple.  Cardiovascular: Normal rate, regular rhythm and normal heart sounds.   Pulmonary/Chest: Effort normal and breath sounds normal.  Neurological: She is alert and oriented to person, place, and time.  Skin: Skin is warm and dry. There is erythema.  Tenderness to palpation of the distal aspect of the right. Mild swelling. No active drainage or discharge. Mild cellulitis noted.  Psychiatric: She has a normal mood and affect.          Assessment & Plan:  Assessment: 1. Cellulitis-distal right  Plan: Doxycycline 100 mg twice a day x7 days. Call the office if symptoms worsen or persist here recheck as scheduled on an as needed.

## 2013-09-02 NOTE — Telephone Encounter (Signed)
Printed and will call pt to pick up after drjenkins signs today

## 2013-09-21 ENCOUNTER — Ambulatory Visit (INDEPENDENT_AMBULATORY_CARE_PROVIDER_SITE_OTHER): Payer: Medicare Other | Admitting: Family Medicine

## 2013-09-21 ENCOUNTER — Encounter: Payer: Self-pay | Admitting: Family Medicine

## 2013-09-21 ENCOUNTER — Telehealth: Payer: Self-pay | Admitting: Internal Medicine

## 2013-09-21 VITALS — BP 128/76 | HR 74 | Temp 98.0°F | Wt 184.0 lb

## 2013-09-21 DIAGNOSIS — J209 Acute bronchitis, unspecified: Secondary | ICD-10-CM | POA: Diagnosis not present

## 2013-09-21 MED ORDER — AZITHROMYCIN 250 MG PO TABS: ORAL_TABLET | ORAL | Status: DC

## 2013-09-21 MED ORDER — HYDROCOD POLST-CHLORPHEN POLST 10-8 MG/5ML PO LQCR: 5.0000 mL | Freq: Two times a day (BID) | ORAL | Status: DC | PRN

## 2013-09-21 NOTE — Progress Notes (Signed)
   Subjective:    Patient ID: [name], female    DOB: [birth year], 73 y.o.   MRN: [id number]  HPI Here for one week of chest tightness and coughing up green sputum. No fever.    Review of Systems  Constitutional: Negative.   HENT: Negative.   Eyes: Negative.   Respiratory: Positive for cough and shortness of breath.        Objective:   Physical Exam  Constitutional: She appears well-developed and well-nourished. No distress.  HENT:  Right Ear: External ear normal.  Left Ear: External ear normal.  Nose: Nose normal.  Mouth/Throat: Oropharynx is clear and moist.  Eyes: Conjunctivae are normal.  Pulmonary/Chest: Effort normal. She has no rales.  Scattered wheezes   Lymphadenopathy:    She has no cervical adenopathy.          Assessment & Plan:  Given a Zpack, add Mucinex.

## 2013-09-21 NOTE — Progress Notes (Signed)
Pre visit review using our clinic review tool, if applicable. No additional management support is needed unless otherwise documented below in the visit note. 

## 2013-09-21 NOTE — Telephone Encounter (Signed)
Patient Information:  Caller Name: [name]  Phone: [phone]  Patient: [name], [name]  Gender: Female  DOB: 1941-07-18  Age: 73 Years  PCP: Benay Pillow (Adults only)  Office Follow Up:  Does the office need to follow up with this patient?: Yes  Instructions For The Office: Patient states [name] advised her Rx for Adderall is available.  She would like to have the Rx destroyed.  Patient has appointment at 16:00 with Dr. Sarajane Jews if you need to discuss this with her. Thank you.   Symptoms  Reason For Call & Symptoms: Patient calling. Reports she had bronchitis  estimated mid December that she treated with Mucinex.  Cough returned on 09/16/13; it does not seem to respond to OTC meds.  Sputum is pale green in color and patient reports mild wheezing.  Emergent symptoms ruled out.  Go to [facility] pwr Cough protocol due to Wheezing is present.  Reviewed Health History In EMR: Yes  Reviewed Medications In EMR: Yes  Reviewed Allergies In EMR: Yes  Reviewed Surgeries / Procedures: Yes  Date of Onset of Symptoms: 09/16/2013  Treatments Tried: Mucinex, Robitussin  Treatments Tried Worked: No  Guideline(s) Used:  Cough  Disposition Per Guideline:   Go to [facility]  Reason For Disposition Reached:   Wheezing is present  Advice Given:  Reassurance  Coughing is the way that our lungs remove irritants and mucus. It helps protect our lungs from getting pneumonia.  You can get a dry hacking cough after a chest cold. Sometimes this type of cough can last 1-3 weeks, and be worse at night.  Here is some care advice that should help.  Cough Medicines:  OTC Cough Drops: Cough drops can help a lot, especially for mild coughs. They reduce coughing by soothing your irritated throat and removing that tickle sensation in the back of the throat. Cough drops also have the advantage of portability - you can carry them with you.  Home Remedy - Hard Candy: Hard candy works just as well as medicine-flavored  OTC cough drops. Diabetics should use sugar-free candy.  Home Remedy - Honey: This old home remedy has been shown to help decrease coughing at night. The adult dosage is 2 teaspoons (10 ml) at bedtime. Honey should not be given to infants under one year of age.  Coughing Spasms:  Drink warm fluids. Inhale warm mist (Reason: both relax the airway and loosen up the phlegm).  Prevent Dehydration:  Drink adequate liquids.  This will help soothe an irritated or dry throat and loosen up the phlegm.  Call Back If:  Difficulty breathing  You become worse.  Patient Will Follow Care Advice:  YES  Appointment Scheduled:  09/21/2013 16:00:00 Appointment Scheduled Provider:  Alysia Penna [facility])

## 2013-10-24 ENCOUNTER — Telehealth: Payer: Self-pay | Admitting: Internal Medicine

## 2013-10-24 ENCOUNTER — Other Ambulatory Visit: Payer: Self-pay | Admitting: *Deleted

## 2013-10-24 MED ORDER — CEPHALEXIN 500 MG PO CAPS: 500.0000 mg | ORAL_CAPSULE | Freq: Three times a day (TID) | ORAL | Status: DC

## 2013-10-24 NOTE — Telephone Encounter (Signed)
Patient Information:  Caller Name: Ivylynn  Phone: [phone]  Patient: [name], [name]  Gender: Female  DOB: 30-Apr-1941  Age: 73 Years  PCP: Benay Pillow (Adults only)  Office Follow Up:  Does the office need to follow up with this patient?: Yes  Instructions For The Office: Patient is requesting to see Dr. Arnoldo Morale today. Advised no appt avaiable at this time. She is asking for possible work in with Dr. Arnoldo Morale.  PLEASE CONTACT PATIENT.  RN Note:  Patient is requesting to see Dr. Arnoldo Morale today. Advised no appt avaiable at this time. She is asking for possible work in with Dr. Arnoldo Morale.  PLEASE CONTACT PATIENT.  Symptoms  Reason For Call & Symptoms: Patient states she got a briar in her  Left thumb July from [facility] [place]. Tried to remove unsucessful and she states 11/14  Dr. Arnoldo Morale had to remove scar tissue from the splintered area. Returned on 12/19 for swelling red and irritated and was given antibiotics/Doxcycline. Patient states thumb is getting bigger red and dark area  underneath. No drainage . No warmth, +painful  Reviewed Health History In EMR: Yes  Reviewed Medications In EMR: Yes  Reviewed Allergies In EMR: Yes  Reviewed Surgeries / Procedures: Yes  Date of Onset of Symptoms: 10/03/2013  Guideline(s) Used:  Wound Infection  Disposition Per Guideline:   Go to Office Now  Reason For Disposition Reached:   Red streak runs from the wound  Advice Given:  Warm Soaks or Local Heat:  If the wound is open, soak it in warm water or put a warm wet cloth on the wound for 20 minutes 3 times per day. Use a warm saltwater solution containing 2 teaspoons of table salt per quart of water. If the wound is closed, apply a heating pad or warm, moist washcloth to the reddened area for 20 minutes 3 times per day.  Warm Soaks or Local Heat:  If the wound is open, soak it in warm water or put a warm wet cloth on the wound for 20 minutes 3 times per day. Use a warm saltwater solution  containing 2 teaspoons of table salt per quart of water. If the wound is closed, apply a heating pad or warm, moist washcloth to the reddened area for 20 minutes 3 times per day.  Call Back If:   Wound becomes more tender  Redness starts to spread  Pus, drainage, or fever occurs  You become worse  RN Overrode Recommendation:  Make Appointment  Patient is requesting to see Dr. Arnoldo Morale today. Advised no appt avaiable at this time. She is asking for possible work in with Dr. Arnoldo Morale.  PLEASE CONTACT PATIENT.

## 2013-10-24 NOTE — Telephone Encounter (Signed)
Appointment made with dr Burney Gauze for tomorrow, feb 9 at 10:20. PT INFORMED.  Encouraged to soak thumb in warm walt water several times a day

## 2013-10-27 NOTE — Telephone Encounter (Signed)
Herbie Baltimore the nurse practioner from dr Burney Gauze office called and stated that pt had cancelled appointment.

## 2013-12-01 ENCOUNTER — Telehealth: Payer: Self-pay

## 2013-12-01 DIAGNOSIS — L0390 Cellulitis, unspecified: Secondary | ICD-10-CM

## 2013-12-01 NOTE — Telephone Encounter (Signed)
Pt req rx amphetamine-dextroamphetamine (ADDERALL) 20 MG tablet ° °

## 2013-12-02 MED ORDER — AMPHETAMINE-DEXTROAMPHETAMINE 20 MG PO TABS: ORAL_TABLET | ORAL | Status: DC

## 2013-12-02 MED ORDER — AMPHETAMINE-DEXTROAMPHETAMINE 20 MG PO TABS
ORAL_TABLET | ORAL | Status: DC
Start: 2013-12-02 — End: 2013-12-02

## 2013-12-02 NOTE — Telephone Encounter (Signed)
Ok per Dr Jenkins, rx up front for p/u, pt aware 

## 2013-12-26 ENCOUNTER — Ambulatory Visit (INDEPENDENT_AMBULATORY_CARE_PROVIDER_SITE_OTHER): Payer: Medicare Other | Admitting: Internal Medicine

## 2013-12-26 ENCOUNTER — Encounter: Payer: Self-pay | Admitting: Internal Medicine

## 2013-12-26 VITALS — BP 132/70 | HR 90 | Temp 97.8°F | Ht 64.0 in | Wt 184.0 lb

## 2013-12-26 DIAGNOSIS — M1990 Unspecified osteoarthritis, unspecified site: Secondary | ICD-10-CM | POA: Diagnosis not present

## 2013-12-26 DIAGNOSIS — E669 Obesity, unspecified: Secondary | ICD-10-CM | POA: Diagnosis not present

## 2013-12-26 DIAGNOSIS — T887XXA Unspecified adverse effect of drug or medicament, initial encounter: Secondary | ICD-10-CM | POA: Diagnosis not present

## 2013-12-26 DIAGNOSIS — E785 Hyperlipidemia, unspecified: Secondary | ICD-10-CM

## 2013-12-26 DIAGNOSIS — G4700 Insomnia, unspecified: Secondary | ICD-10-CM

## 2013-12-26 DIAGNOSIS — R82998 Other abnormal findings in urine: Secondary | ICD-10-CM

## 2013-12-26 DIAGNOSIS — I10 Essential (primary) hypertension: Secondary | ICD-10-CM | POA: Diagnosis not present

## 2013-12-26 DIAGNOSIS — H9190 Unspecified hearing loss, unspecified ear: Secondary | ICD-10-CM

## 2013-12-26 DIAGNOSIS — R8290 Unspecified abnormal findings in urine: Secondary | ICD-10-CM

## 2013-12-26 DIAGNOSIS — Z79899 Other long term (current) drug therapy: Secondary | ICD-10-CM | POA: Diagnosis not present

## 2013-12-26 DIAGNOSIS — J45901 Unspecified asthma with (acute) exacerbation: Secondary | ICD-10-CM | POA: Diagnosis not present

## 2013-12-26 LAB — HEPATIC FUNCTION PANEL
ALT: 28 U/L (ref 0–35)
AST: 28 U/L (ref 0–37)
Albumin: 3.8 g/dL (ref 3.5–5.2)
Alkaline Phosphatase: 80 U/L (ref 39–117)
BILIRUBIN DIRECT: 0 mg/dL (ref 0.0–0.3)
BILIRUBIN TOTAL: 0.4 mg/dL (ref 0.3–1.2)
Total Protein: 6.9 g/dL (ref 6.0–8.3)

## 2013-12-26 LAB — CBC WITH DIFFERENTIAL/PLATELET
BASOS PCT: 0.4 % (ref 0.0–3.0)
Basophils Absolute: 0 10*3/uL (ref 0.0–0.1)
EOS ABS: 0.2 10*3/uL (ref 0.0–0.7)
Eosinophils Relative: 1.8 % (ref 0.0–5.0)
HCT: 44.3 % (ref 36.0–46.0)
Hemoglobin: 14.6 g/dL (ref 12.0–15.0)
Lymphocytes Relative: 41.4 % (ref 12.0–46.0)
Lymphs Abs: 4.5 10*3/uL — ABNORMAL HIGH (ref 0.7–4.0)
MCHC: 33 g/dL (ref 30.0–36.0)
MCV: 86.8 fl (ref 78.0–100.0)
MONO ABS: 1 10*3/uL (ref 0.1–1.0)
Monocytes Relative: 8.7 % (ref 3.0–12.0)
NEUTROS ABS: 5.2 10*3/uL (ref 1.4–7.7)
NEUTROS PCT: 47.7 % (ref 43.0–77.0)
Platelets: 347 10*3/uL (ref 150.0–400.0)
RBC: 5.1 Mil/uL (ref 3.87–5.11)
RDW: 13.5 % (ref 11.5–14.6)
WBC: 11 10*3/uL — ABNORMAL HIGH (ref 4.5–10.5)

## 2013-12-26 LAB — POCT URINALYSIS DIPSTICK
Bilirubin, UA: NEGATIVE
Glucose, UA: NEGATIVE
KETONES UA: NEGATIVE
Nitrite, UA: NEGATIVE
PH UA: 6
Protein, UA: NEGATIVE
SPEC GRAV UA: 1.025
Urobilinogen, UA: 0.2

## 2013-12-26 LAB — BASIC METABOLIC PANEL
BUN: 23 mg/dL (ref 6–23)
CHLORIDE: 98 meq/L (ref 96–112)
CO2: 29 mEq/L (ref 19–32)
CREATININE: 0.8 mg/dL (ref 0.4–1.2)
Calcium: 10.8 mg/dL — ABNORMAL HIGH (ref 8.4–10.5)
GFR: 75.62 mL/min (ref >60.00)
Glucose, Bld: 94 mg/dL (ref 70–99)
POTASSIUM: 3.2 meq/L — AB (ref 3.5–5.1)
Sodium: 138 mEq/L (ref 135–145)

## 2013-12-26 LAB — LDL CHOLESTEROL, DIRECT: Direct LDL: 114.2 mg/dL

## 2013-12-26 LAB — TSH: TSH: 0.04 u[IU]/mL — ABNORMAL LOW (ref 0.35–5.50)

## 2013-12-26 MED ORDER — ZALEPLON 10 MG PO CAPS: 10.0000 mg | ORAL_CAPSULE | Freq: Every evening | ORAL | Status: DC | PRN

## 2013-12-26 NOTE — Progress Notes (Signed)
Pre visit review using our clinic review tool, if applicable. No additional management support is needed unless otherwise documented below in the visit note. 

## 2013-12-26 NOTE — Addendum Note (Signed)
Addended by: Donnita Falls on: 12/26/2013 04:37 PM   Modules accepted: Orders

## 2013-12-26 NOTE — Patient Instructions (Signed)
The patient is instructed to continue all medications as prescribed. Schedule followup with check out clerk upon leaving the clinic  

## 2013-12-26 NOTE — Progress Notes (Signed)
Subjective:    Patient ID: [name], female    DOB: Nov 23, 1940, 73 y.o.   MRN: [id number]  Hypertension Pertinent negatives include no headaches, neck pain or shortness of breath.   Mood and ADD Stable HTN CPX Medicare wellness  exam  Review of Systems  Constitutional: Negative for activity change, appetite change and fatigue.  HENT: Negative for congestion, ear pain, postnasal drip and sinus pressure.   Eyes: Negative for redness and visual disturbance.  Respiratory: Negative for cough, shortness of breath and wheezing.   Gastrointestinal: Negative for abdominal pain and abdominal distention.  Genitourinary: Negative for dysuria, frequency and menstrual problem.  Musculoskeletal: Negative for arthralgias, joint swelling, myalgias and neck pain.  Skin: Negative for rash and wound.  Neurological: Negative for dizziness, weakness and headaches.  Hematological: Negative for adenopathy. Does not bruise/bleed easily.  Psychiatric/Behavioral: Negative for sleep disturbance and decreased concentration.   Past Medical History  Diagnosis Date  . HYPERLIPIDEMIA NEC/NOS 04/19/2007  . HYPERTENSION 04/07/2007  . ALLERGIC RHINITIS 11/30/2008  . ASTHMA UNSPECIFIED WITH EXACERBATION 02/17/2008  . Esophageal reflux 11/08/2008  . OSTEOARTHRITIS 04/07/2007  . OSTEOPENIA 03/10/2008  . INSOMNIA 08/07/2010  . CAROTID BRUIT, LEFT 04/19/2007  . CLOSED FRACTURE OF METATARSAL BONE 06/04/2009  . ALLERGIC RHINITIS 11/30/2008  . Closed fracture of lateral malleolus 06/04/2009  . GANGLION CYST 04/19/2007  . UTI 07/20/2009    History   Social History  . Marital Status: Married    Spouse Name: N/A    Number of Children: N/A  . Years of Education: N/A   Occupational History  . retired    Social History Main Topics  . Smoking status: Never Smoker   . Smokeless tobacco: Never Used     Comment: smoked 1.5 for  20 years  . Alcohol Use: No  . Drug Use: No  . Sexual Activity: Yes   Other Topics  Concern  . Not on file   Social History Narrative  . No narrative on file    Past Surgical History  Procedure Laterality Date  . Tubal ligation    . Total hip arthroplasty    . Knuckle replacement      Family History  Problem Relation Age of Onset  . Heart disease Mother   . COPD Father   . Cancer Brother   . Alzheimer's disease Sister     Allergies  Allergen Reactions  . Cephalexin     Current Outpatient Prescriptions on File Prior to Visit  Medication Sig Dispense Refill  . amphetamine-dextroamphetamine (ADDERALL) 20 MG tablet Take 1 bid and may have extra 1/2 qd prn  75 tablet  0  . aspirin-acetaminophen-caffeine (EXCEDRIN MIGRAINE) 250-250-65 MG per tablet Take 1 tablet by mouth every 6 (six) hours as needed.  30 tablet  0  . buPROPion (WELLBUTRIN XL) 300 MG 24 hr tablet Take 1 tablet (300 mg total) by mouth daily.  90 tablet  3  . FLUoxetine (PROZAC) 10 MG capsule TAKE THREE CAPSULES BY MOUTH EVERY DAY  270 capsule  3  . Multiple Vitamin (MULTIVITAMIN) capsule Take 1 capsule by mouth daily.        . valsartan-hydrochlorothiazide (DIOVAN-HCT) 320-25 MG per tablet TAKE ONE TABLET BY MOUTH EVERY DAY  90 tablet  3  . VOLTAREN 1 % GEL APPLY   TOPICALLY TO AFFECTED AREA 4 TIMES DAILY  100 g  11   No current facility-administered medications on file prior to visit.    BP 132/70  Pulse  90  Temp(Src) 97.8 F (36.6 C) (Oral)  Ht 5\' 4"  (1.626 m)  Wt 184 lb (83.462 kg)  BMI 31.57 kg/m2  SpO2 90%       Objective:   Physical Exam  Nursing note and vitals reviewed. Constitutional: She is oriented to person, place, and time. She appears well-developed and well-nourished. No distress.  HENT:  Head: Normocephalic and atraumatic.  Eyes: Conjunctivae and EOM are normal. Pupils are equal, round, and reactive to light.  Neck: Normal range of motion. Neck supple. No JVD present. No tracheal deviation present. No thyromegaly present.  Cardiovascular: Normal rate and regular  rhythm.   Murmur heard. Pulmonary/Chest: Effort normal and breath sounds normal. She has no wheezes. She exhibits no tenderness.  Abdominal: Soft. Bowel sounds are normal.  Musculoskeletal: Normal range of motion. She exhibits no edema and no tenderness.  Lymphadenopathy:    She has no cervical adenopathy.  Neurological: She is alert and oriented to person, place, and time. She has normal reflexes. No cranial nerve deficit.  Skin: Skin is warm and dry. She is not diaphoretic.  Psychiatric: She has a normal mood and affect. Her behavior is normal.          Assessment & Plan:  Hearing loss Screening labs Insomnia Finger  Where stick was feels sore  Ganglion cyst?

## 2013-12-27 ENCOUNTER — Telehealth: Payer: Self-pay | Admitting: Internal Medicine

## 2013-12-27 NOTE — Telephone Encounter (Signed)
Relevant patient education assigned to patient using Emmi. ° °

## 2013-12-28 LAB — URINE CULTURE: Colony Count: 75000

## 2013-12-30 ENCOUNTER — Telehealth: Payer: Self-pay | Admitting: Internal Medicine

## 2013-12-30 MED ORDER — CIPROFLOXACIN HCL 250 MG PO TABS: 250.0000 mg | ORAL_TABLET | Freq: Two times a day (BID) | ORAL | Status: DC

## 2013-12-30 NOTE — Telephone Encounter (Signed)
cipro sent into pt's pharmacy because she states that she is having uti sx

## 2013-12-30 NOTE — Telephone Encounter (Signed)
Pt is calling needing UA results. [facility] in [place]

## 2014-01-04 ENCOUNTER — Ambulatory Visit: Payer: Medicare Other | Admitting: Internal Medicine

## 2014-02-16 ENCOUNTER — Telehealth: Payer: Self-pay | Admitting: Internal Medicine

## 2014-02-16 DIAGNOSIS — L0390 Cellulitis, unspecified: Secondary | ICD-10-CM

## 2014-02-16 MED ORDER — AMPHETAMINE-DEXTROAMPHETAMINE 20 MG PO TABS: ORAL_TABLET | ORAL | Status: DC

## 2014-02-16 NOTE — Telephone Encounter (Signed)
Pt needs new rx generic adderall 20 mg. Pt would like to pick up on Monday 02-20-14

## 2014-02-16 NOTE — Telephone Encounter (Signed)
Ok per Dr Arnoldo Morale, rx will be ready for p/u on Friday 02/17/14

## 2014-03-01 ENCOUNTER — Encounter: Payer: Self-pay | Admitting: Internal Medicine

## 2014-04-05 ENCOUNTER — Other Ambulatory Visit: Payer: Self-pay | Admitting: Internal Medicine

## 2014-04-24 ENCOUNTER — Other Ambulatory Visit: Payer: Self-pay | Admitting: Internal Medicine

## 2014-05-29 ENCOUNTER — Telehealth: Payer: Self-pay | Admitting: Internal Medicine

## 2014-05-29 NOTE — Telephone Encounter (Signed)
Pt has appt w/ dr Shawna Orleans on 10 /26. But needs rx amphetamine-dextroamphetamine (ADDERALL) 20 MG tablet to get her through to 10/26. Pt states dr Arnoldo Morale had advised her to est w/ dr Shawna Orleans. Pt coming to [place] today and was hoping to get rx this afternoon. Advised pt not sure, would have to cb, dr Shawna Orleans not in.

## 2014-05-30 MED ORDER — AMPHETAMINE-DEXTROAMPHETAMINE 20 MG PO TABS: ORAL_TABLET | ORAL | Status: DC

## 2014-05-30 NOTE — Telephone Encounter (Signed)
YES

## 2014-05-30 NOTE — Telephone Encounter (Signed)
Pt aware to pick up

## 2014-05-30 NOTE — Telephone Encounter (Signed)
[name] said to see id another provider will sign off on this rx. Pt will be in [place] today and would like to pick up the rx  Around 2pm

## 2014-05-30 NOTE — Telephone Encounter (Signed)
Could you sign for this? 

## 2014-06-27 ENCOUNTER — Telehealth: Payer: Self-pay | Admitting: Internal Medicine

## 2014-06-27 NOTE — Telephone Encounter (Signed)
Pt request refill of the following: amphetamine-dextroamphetamine (ADDERALL) 20 MG tablet ° ° °Phamacy: ° °

## 2014-06-28 NOTE — Telephone Encounter (Signed)
I have not seen this pt yet.  I suggest she establish with new provider.  See if another provider will accept her and take over prescribing her ADD medication.

## 2014-06-29 MED ORDER — AMPHETAMINE-DEXTROAMPHETAMINE 20 MG PO TABS: ORAL_TABLET | ORAL | Status: DC

## 2014-06-29 NOTE — Telephone Encounter (Signed)
yes

## 2014-06-29 NOTE — Telephone Encounter (Signed)
Pt has an upcoming appt on 07/10/14 with Dr Shawna Orleans.  She is an old [name] pt.  Dr Raliegh Ip can you sign for this for 1 month until she gets established with Dr Shawna Orleans?

## 2014-06-30 NOTE — Telephone Encounter (Signed)
rx up front for p/u, pt aware

## 2014-07-04 ENCOUNTER — Telehealth: Payer: Self-pay | Admitting: Internal Medicine

## 2014-07-04 MED ORDER — FLUOXETINE HCL 10 MG PO CAPS: ORAL_CAPSULE | ORAL | Status: DC

## 2014-07-04 NOTE — Telephone Encounter (Signed)
°  [id number] - [place], [place] - [address] is requesting re-fill on FLUoxetine (PROZAC) 10 MG capsule

## 2014-07-04 NOTE — Telephone Encounter (Signed)
rx sent in electronically 

## 2014-07-05 ENCOUNTER — Other Ambulatory Visit (INDEPENDENT_AMBULATORY_CARE_PROVIDER_SITE_OTHER): Payer: Medicare Other

## 2014-07-05 DIAGNOSIS — I10 Essential (primary) hypertension: Secondary | ICD-10-CM | POA: Diagnosis not present

## 2014-07-05 DIAGNOSIS — D649 Anemia, unspecified: Secondary | ICD-10-CM

## 2014-07-05 DIAGNOSIS — E785 Hyperlipidemia, unspecified: Secondary | ICD-10-CM

## 2014-07-05 DIAGNOSIS — R300 Dysuria: Secondary | ICD-10-CM | POA: Diagnosis not present

## 2014-07-05 DIAGNOSIS — R946 Abnormal results of thyroid function studies: Secondary | ICD-10-CM | POA: Diagnosis not present

## 2014-07-05 LAB — CBC WITH DIFFERENTIAL/PLATELET
Basophils Absolute: 0 10*3/uL (ref 0.0–0.1)
Basophils Relative: 0.6 % (ref 0.0–3.0)
EOS PCT: 2.1 % (ref 0.0–5.0)
Eosinophils Absolute: 0.2 10*3/uL (ref 0.0–0.7)
HCT: 45.2 % (ref 36.0–46.0)
Hemoglobin: 14.8 g/dL (ref 12.0–15.0)
Lymphocytes Relative: 39.5 % (ref 12.0–46.0)
Lymphs Abs: 3.2 10*3/uL (ref 0.7–4.0)
MCHC: 32.8 g/dL (ref 30.0–36.0)
MCV: 86.8 fl (ref 78.0–100.0)
MONOS PCT: 7.9 % (ref 3.0–12.0)
Monocytes Absolute: 0.6 10*3/uL (ref 0.1–1.0)
NEUTROS PCT: 49.9 % (ref 43.0–77.0)
Neutro Abs: 4.1 10*3/uL (ref 1.4–7.7)
PLATELETS: 379 10*3/uL (ref 150.0–400.0)
RBC: 5.21 Mil/uL — ABNORMAL HIGH (ref 3.87–5.11)
RDW: 12.8 % (ref 11.5–15.5)
WBC: 8.1 10*3/uL (ref 4.0–10.5)

## 2014-07-05 LAB — POCT URINALYSIS DIPSTICK
BILIRUBIN UA: NEGATIVE
Glucose, UA: NEGATIVE
KETONES UA: NEGATIVE
Leukocytes, UA: NEGATIVE
Nitrite, UA: NEGATIVE
PH UA: 6.5
PROTEIN UA: NEGATIVE
SPEC GRAV UA: 1.01
Urobilinogen, UA: 0.2

## 2014-07-05 LAB — HEPATIC FUNCTION PANEL
ALK PHOS: 91 U/L (ref 39–117)
ALT: 23 U/L (ref 0–35)
AST: 29 U/L (ref 0–37)
Albumin: 3.3 g/dL — ABNORMAL LOW (ref 3.5–5.2)
BILIRUBIN DIRECT: 0 mg/dL (ref 0.0–0.3)
Total Bilirubin: 0.7 mg/dL (ref 0.2–1.2)
Total Protein: 7.1 g/dL (ref 6.0–8.3)

## 2014-07-05 LAB — BASIC METABOLIC PANEL
BUN: 20 mg/dL (ref 6–23)
CO2: 25 mEq/L (ref 19–32)
CREATININE: 0.9 mg/dL (ref 0.4–1.2)
Calcium: 10 mg/dL (ref 8.4–10.5)
Chloride: 104 mEq/L (ref 96–112)
GFR: 63.34 mL/min (ref >60.00)
Glucose, Bld: 82 mg/dL (ref 70–99)
Potassium: 4 mEq/L (ref 3.5–5.1)
Sodium: 144 mEq/L (ref 135–145)

## 2014-07-05 LAB — LIPID PANEL
Cholesterol: 197 mg/dL (ref 0–200)
HDL: 73.2 mg/dL (ref >39.00)
LDL CALC: 113 mg/dL — AB (ref 0–99)
NONHDL: 123.8
Total CHOL/HDL Ratio: 3
Triglycerides: 55 mg/dL (ref 0.0–149.0)
VLDL: 11 mg/dL (ref 0.0–40.0)

## 2014-07-05 LAB — TSH: TSH: 0.06 u[IU]/mL — ABNORMAL LOW (ref 0.35–4.50)

## 2014-07-10 ENCOUNTER — Encounter: Payer: Self-pay | Admitting: Internal Medicine

## 2014-07-10 ENCOUNTER — Ambulatory Visit (INDEPENDENT_AMBULATORY_CARE_PROVIDER_SITE_OTHER): Payer: Medicare Other | Admitting: Internal Medicine

## 2014-07-10 VITALS — BP 154/90 | HR 102 | Temp 98.0°F | Ht 62.75 in | Wt 180.0 lb

## 2014-07-10 DIAGNOSIS — E0590 Thyrotoxicosis, unspecified without thyrotoxic crisis or storm: Secondary | ICD-10-CM | POA: Diagnosis not present

## 2014-07-10 DIAGNOSIS — F988 Other specified behavioral and emotional disorders with onset usually occurring in childhood and adolescence: Secondary | ICD-10-CM

## 2014-07-10 DIAGNOSIS — I10 Essential (primary) hypertension: Secondary | ICD-10-CM | POA: Diagnosis not present

## 2014-07-10 DIAGNOSIS — Z0000 Encounter for general adult medical examination without abnormal findings: Secondary | ICD-10-CM | POA: Insufficient documentation

## 2014-07-10 DIAGNOSIS — F909 Attention-deficit hyperactivity disorder, unspecified type: Secondary | ICD-10-CM | POA: Diagnosis not present

## 2014-07-10 DIAGNOSIS — R7989 Other specified abnormal findings of blood chemistry: Secondary | ICD-10-CM | POA: Insufficient documentation

## 2014-07-10 DIAGNOSIS — Z23 Encounter for immunization: Secondary | ICD-10-CM | POA: Diagnosis not present

## 2014-07-10 DIAGNOSIS — Z79899 Other long term (current) drug therapy: Secondary | ICD-10-CM | POA: Diagnosis not present

## 2014-07-10 LAB — T3, FREE: T3 FREE: 3.5 pg/mL (ref 2.3–4.2)

## 2014-07-10 LAB — T4, FREE: Free T4: 1.14 ng/dL (ref 0.60–1.60)

## 2014-07-10 MED ORDER — AMPHETAMINE-DEXTROAMPHETAMINE 20 MG PO TABS: ORAL_TABLET | ORAL | Status: DC

## 2014-07-10 NOTE — Assessment & Plan Note (Signed)
73 year old white female with attention deficit disorder by history. Use of stimulants may be problematic considering her advanced age and history of hypertension. Refer to behavioral health for objective ADD testing. Consider transitioning to non-stimulant medication.

## 2014-07-10 NOTE — Assessment & Plan Note (Signed)
Reviewed adult health maintenance protocols.  Medicare questionnaire reviewed in detail. See attached form. Encourage weight loss. No issues with memory, depression or fall risk.  Patient updated with appropriate vaccines. Patient advised to obtain screening mammogram. Patient also advised to follow-up with her gastroenterologist regarding need for follow-up colonoscopy.

## 2014-07-10 NOTE — Progress Notes (Signed)
Subjective:    Patient ID: [name], female    DOB: Jan 22, 1941, 73 y.o.   MRN: [id number]  HPI  73 year old white female with history of depression/anxiety, hypertension and attention deficit disorder to transfer care. Patient previously followed by Dr. Arnoldo Morale patient's medical history reviewed in detail. She has been treated for depression/anxiety for 20-30 years patient currently taking fluoxetine 10 mg once daily and bupropion 300 mg once daily.  She also reports her primary care physician diagnosed her with possible attention deficit disorder over 10 years ago. Patient has been taking Adderall.  Patient complains of persistent anxiety symptoms despite her current medications  Patient also here for Medicare physical. Her Medicare wellness questionnaire form reviewed in detail. She has not had any hospitalizations within the last year.   Patient has not seen any other specialist within the last year no surgeries. Patient does not drink. She has previous history of tobacco use. She quit smoking in her 26s. She smoked for 20 years (1-1/2-2 packs per day)  Patient reports exercising on a regular basis. She does not use any illicit drugs. She is not sexually active.  She reports occasional difficulty remembering names. Otherwise she denies any issues with memory. Patient maintains her own finances and is able to still calculations in her head.  She has no issues with activities of daily living. She has fallen within the last year but did not suffer any significant injury. She denies any abnormal gait or gait instability.  Review of Systems  Constitutional: Negative for activity change, appetite change and unexpected weight change.  Eyes: Negative for visual disturbance.  Respiratory: Negative for cough, chest tightness and shortness of breath.   Cardiovascular: Negative for chest pain.  Genitourinary: Negative for difficulty urinating.  Neurological: Negative for headaches.  Gastrointestinal: Negative for abdominal pain, heartburn melena or hematochezia Psych: Negative for depression.  Intermittent anxiety Endo:  Patient denies heat or cold intolerance, negative for diarrhea      Past Medical History  Diagnosis Date  . HYPERLIPIDEMIA NEC/NOS 04/19/2007  . HYPERTENSION 04/07/2007  . ALLERGIC RHINITIS 11/30/2008  . ASTHMA UNSPECIFIED WITH EXACERBATION 02/17/2008  . Esophageal reflux 11/08/2008  . OSTEOARTHRITIS 04/07/2007  . OSTEOPENIA 03/10/2008  . INSOMNIA 08/07/2010  . CAROTID BRUIT, LEFT 04/19/2007  . CLOSED FRACTURE OF METATARSAL BONE 06/04/2009  . ALLERGIC RHINITIS 11/30/2008  . Closed fracture of lateral malleolus 06/04/2009  . GANGLION CYST 04/19/2007  . UTI 07/20/2009    History   Social History  . Marital Status: Married    Spouse Name: N/A    Number of Children: N/A  . Years of Education: N/A   Occupational History  . retired    Social History Main Topics  . Smoking status: Never Smoker   . Smokeless tobacco: Never Used     Comment: smoked 1.5 for  20 years  . Alcohol Use: No  . Drug Use: No  . Sexual Activity: Yes   Other Topics Concern  . Not on file   Social History Narrative   Married for 50 years. She has 1 son that age 38   Retired from [employer]          Past Surgical History  Procedure Laterality Date  . Tubal ligation    . Total hip arthroplasty    . Knuckle replacement      Family History  Problem Relation Age of Onset  . Heart disease Mother     Mother also has significant carotid artery  stenosis  . COPD Father   . Cancer Brother   . Alzheimer's disease Sister   . Alcoholism Father   . Breast cancer Neg Hx     Allergies  Allergen Reactions  . Cephalexin     Current Outpatient Prescriptions on File Prior to Visit  Medication Sig Dispense Refill  . aspirin-acetaminophen-caffeine (EXCEDRIN MIGRAINE) 250-250-65 MG per tablet Take 1 tablet by mouth every 6 (six) hours as needed.  30 tablet  0    . buPROPion (WELLBUTRIN XL) 300 MG 24 hr tablet TAKE ONE TABLET BY MOUTH ONCE DAILY  90 tablet  2  . FLUoxetine (PROZAC) 10 MG capsule TAKE THREE CAPSULES BY MOUTH EVERY DAY  270 capsule  1  . Multiple Vitamin (MULTIVITAMIN) capsule Take 1 capsule by mouth daily.        . valsartan-hydrochlorothiazide (DIOVAN-HCT) 320-25 MG per tablet TAKE ONE TABLET BY MOUTH ONCE DAILY  90 tablet  2  . VOLTAREN 1 % GEL APPLY   TOPICALLY TO AFFECTED AREA 4 TIMES DAILY  100 g  11   No current facility-administered medications on file prior to visit.    BP 154/90  Pulse 102  Temp(Src) 98 F (36.7 C) (Oral)  Ht 5' 2.75" (1.594 m)  Wt 180 lb (81.647 kg)  BMI 32.13 kg/m2    Objective:   Physical Exam  Constitutional: She is oriented to person, place, and time. She appears well-developed and well-nourished. No distress.  HENT:  Head: Normocephalic and atraumatic.  Right Ear: External ear normal.  Left Ear: External ear normal.  Mouth/Throat: Oropharynx is clear and moist.  Eyes: EOM are normal. Pupils are equal, round, and reactive to light. No scleral icterus.  Neck: Neck supple.  No carotid bruit  Cardiovascular: Normal rate, regular rhythm and normal heart sounds.   No murmur heard. Pulmonary/Chest: Effort normal and breath sounds normal. She has no wheezes.  Normal breast exam, no abnormal skin dimpling or other skin changes, negative for breast mass or nipple discharge  Abdominal: Soft. Bowel sounds are normal. She exhibits no distension and no mass.  Musculoskeletal: She exhibits no edema and no tenderness.  Lymphadenopathy:    She has no cervical adenopathy.  Neurological: She is alert and oriented to person, place, and time. No cranial nerve deficit.  Skin: Skin is warm and dry.  Psychiatric: She has a normal mood and affect. Her behavior is normal.          Assessment & Plan:

## 2014-07-10 NOTE — Progress Notes (Signed)
Pre visit review using our clinic review tool, if applicable. No additional management support is needed unless otherwise documented below in the visit note. 

## 2014-07-10 NOTE — Assessment & Plan Note (Signed)
Patient's pressure is high normal today. Patient reports previous readings have been normal. Patient advised to monitor home blood pressure readings. Reassess in 2 months. Continue same dose of valsartan/hydrochlorothiazide.  BP: 154/90 mmHg   Lab Results  Component Value Date   NA 144 07/05/2014   K 4.0 07/05/2014   CL 104 07/05/2014   CO2 25 07/05/2014   Lab Results  Component Value Date   CREATININE 0.9 07/05/2014

## 2014-07-10 NOTE — Assessment & Plan Note (Signed)
Patient has suppressed TSH. Her only symptom of hypothyroidism is intermittent uncontrolled anxiety. Obtain free T4 and free T3 obtain thyroid uptake scan and ultrasound.

## 2014-07-11 ENCOUNTER — Telehealth: Payer: Self-pay | Admitting: Internal Medicine

## 2014-07-11 NOTE — Telephone Encounter (Signed)
emmi emailed °

## 2014-07-12 ENCOUNTER — Encounter: Payer: Self-pay | Admitting: Internal Medicine

## 2014-07-12 NOTE — Telephone Encounter (Signed)
Pt is following up on the e-mail sent below.  She would like a callback at [phone]

## 2014-07-13 ENCOUNTER — Other Ambulatory Visit (INDEPENDENT_AMBULATORY_CARE_PROVIDER_SITE_OTHER): Payer: Medicare Other

## 2014-07-13 DIAGNOSIS — R319 Hematuria, unspecified: Secondary | ICD-10-CM

## 2014-07-13 LAB — URINALYSIS, ROUTINE W REFLEX MICROSCOPIC
BILIRUBIN URINE: NEGATIVE
Ketones, ur: NEGATIVE
Leukocytes, UA: NEGATIVE
Nitrite: NEGATIVE
Specific Gravity, Urine: 1.015 (ref 1.000–1.030)
Total Protein, Urine: NEGATIVE
Urine Glucose: NEGATIVE
Urobilinogen, UA: 0.2 (ref 0.0–1.0)
pH: 6.5 (ref 5.0–8.0)

## 2014-07-17 ENCOUNTER — Other Ambulatory Visit: Payer: Self-pay | Admitting: Internal Medicine

## 2014-07-17 DIAGNOSIS — R7989 Other specified abnormal findings of blood chemistry: Secondary | ICD-10-CM

## 2014-07-17 NOTE — Addendum Note (Signed)
Addended by: Rosine Abe on: 07/17/2014 04:43 PM   Modules accepted: Orders, SmartSet

## 2014-07-18 ENCOUNTER — Ambulatory Visit (HOSPITAL_COMMUNITY): Payer: Medicare Other

## 2014-07-24 ENCOUNTER — Encounter (HOSPITAL_COMMUNITY)
Admission: RE | Admit: 2014-07-24 | Discharge: 2014-07-24 | Disposition: A | Discharge status: Home / Self Care | Payer: Medicare Other | Source: Ambulatory Visit | Attending: Internal Medicine | Admitting: Internal Medicine

## 2014-07-24 ENCOUNTER — Encounter (HOSPITAL_COMMUNITY): Payer: Medicare Other

## 2014-07-24 DIAGNOSIS — R7989 Other specified abnormal findings of blood chemistry: Secondary | ICD-10-CM | POA: Insufficient documentation

## 2014-07-24 MED ORDER — SODIUM IODIDE I 131 CAPSULE
9.0000 | Freq: Once | INTRAVENOUS | Status: AC | PRN
  Administered 2014-07-24: 9 via ORAL

## 2014-07-25 ENCOUNTER — Encounter (HOSPITAL_COMMUNITY): Payer: Medicare Other

## 2014-07-25 ENCOUNTER — Other Ambulatory Visit: Payer: Self-pay | Admitting: Internal Medicine

## 2014-07-25 ENCOUNTER — Encounter (HOSPITAL_COMMUNITY)
Admission: RE | Admit: 2014-07-25 | Discharge: 2014-07-25 | Disposition: A | Discharge status: Home / Self Care | Payer: Medicare Other | Source: Ambulatory Visit | Attending: Internal Medicine | Admitting: Internal Medicine

## 2014-07-25 DIAGNOSIS — Z1231 Encounter for screening mammogram for malignant neoplasm of breast: Secondary | ICD-10-CM

## 2014-07-25 DIAGNOSIS — R7989 Other specified abnormal findings of blood chemistry: Secondary | ICD-10-CM | POA: Diagnosis not present

## 2014-07-25 DIAGNOSIS — R946 Abnormal results of thyroid function studies: Secondary | ICD-10-CM | POA: Diagnosis not present

## 2014-07-25 MED ORDER — SODIUM PERTECHNETATE TC 99M INJECTION
10.0000 | Freq: Once | INTRAVENOUS | Status: AC | PRN
  Administered 2014-07-25: 10 via INTRAVENOUS

## 2014-07-27 ENCOUNTER — Encounter: Payer: Self-pay | Admitting: Internal Medicine

## 2014-07-27 DIAGNOSIS — E042 Nontoxic multinodular goiter: Secondary | ICD-10-CM

## 2014-07-27 DIAGNOSIS — Z1211 Encounter for screening for malignant neoplasm of colon: Secondary | ICD-10-CM

## 2014-08-01 ENCOUNTER — Encounter (INDEPENDENT_AMBULATORY_CARE_PROVIDER_SITE_OTHER): Payer: Self-pay | Admitting: *Deleted

## 2014-08-02 ENCOUNTER — Ambulatory Visit (HOSPITAL_COMMUNITY)
Admission: RE | Admit: 2014-08-02 | Discharge: 2014-08-02 | Disposition: A | Discharge status: Home / Self Care | Payer: Medicare Other | Source: Ambulatory Visit | Attending: Internal Medicine | Admitting: Internal Medicine

## 2014-08-02 DIAGNOSIS — Z1231 Encounter for screening mammogram for malignant neoplasm of breast: Secondary | ICD-10-CM | POA: Diagnosis not present

## 2014-08-08 ENCOUNTER — Encounter: Payer: Self-pay | Admitting: Internal Medicine

## 2014-08-18 ENCOUNTER — Encounter: Payer: Self-pay | Admitting: Endocrinology

## 2014-08-18 ENCOUNTER — Ambulatory Visit (INDEPENDENT_AMBULATORY_CARE_PROVIDER_SITE_OTHER): Payer: Medicare Other | Admitting: Endocrinology

## 2014-08-18 VITALS — BP 142/98 | HR 84 | Temp 98.0°F | Resp 14 | Ht 62.75 in | Wt 179.2 lb

## 2014-08-18 DIAGNOSIS — E0520 Thyrotoxicosis with toxic multinodular goiter without thyrotoxic crisis or storm: Secondary | ICD-10-CM | POA: Diagnosis not present

## 2014-08-18 DIAGNOSIS — R61 Generalized hyperhidrosis: Secondary | ICD-10-CM | POA: Diagnosis not present

## 2014-08-18 DIAGNOSIS — I10 Essential (primary) hypertension: Secondary | ICD-10-CM | POA: Diagnosis not present

## 2014-08-18 NOTE — Progress Notes (Signed)
Patient ID: [name], female   DOB: Dec 22, 1940, 73 y.o.   MRN: [id number]    Reason for Appointment: ?  Hyperthyroidism    History of Present Illness:   She apparently has had a low TSH level as far back as 7 years ago No history of goiter documented in her previous exams On her recent evaluation she had a routine TSH done and this was significantly low The patient said that she ends to get hot and sweaty with physical activity but this has been going on for the last 5 years.  This may be a little worse recently.  She does not have any heat intolerance or sweating episodes otherwise including at night She may feel palpitations occasionally but this is mostly after her morning coffee and her Adderall medication Does not think she has any shakiness of her hands but inside me at times feel Trembly She has had long history of anxiety and depression  She does complain of feeling fatigue which is not new  No recent significant weight loss  She has had no difficulty with swallowing  Does not have any choking sensation in her neck or pressure in any position   Wt Readings from Last 3 Encounters:  08/18/14 179 lb 3.2 oz (81.285 kg)  07/10/14 180 lb (81.647 kg)  12/26/13 184 lb (83.462 kg)    Lab Results  Component Value Date   FREET4 1.14 07/10/2014   FREET4 0.89 03/21/2013   TSH 0.06* 07/05/2014   TSH 0.04* 12/26/2013   TSH 0.05* 03/21/2013    She was also sent for a thyroid scan and uptake which showed the I-131 uptake to be 34%, above normal and the scan did not show any hot or cold areas but no detailed description is available       Medication List       This list is accurate as of: 08/18/14  1:48 PM.  Always use your most recent med list.               amphetamine-dextroamphetamine 20 MG tablet  Commonly known as:  ADDERALL  Take 1 bid and may have extra 1/2 qd prn     aspirin-acetaminophen-caffeine 250-250-65 MG per tablet  Commonly known as:  EXCEDRIN  MIGRAINE  Take 1 tablet by mouth every 6 (six) hours as needed.     buPROPion 300 MG 24 hr tablet  Commonly known as:  WELLBUTRIN XL  TAKE ONE TABLET BY MOUTH ONCE DAILY     calcium carbonate 500 MG chewable tablet  Commonly known as:  TUMS - dosed in mg elemental calcium  Chew 1 tablet by mouth daily.     co-enzyme Q-10 30 MG capsule  Take 30 mg by mouth 3 (three) times daily.     cyanocobalamin 500 MCG tablet  Take 500 mcg by mouth daily.     FLUoxetine 10 MG capsule  Commonly known as:  PROZAC  TAKE THREE CAPSULES BY MOUTH EVERY DAY     glucosamine-chondroitin 500-400 MG tablet  Take 1 tablet by mouth 3 (three) times daily.     Melatonin 3 MG Tabs  Take by mouth.     multivitamin capsule  Take 1 capsule by mouth daily.     valsartan-hydrochlorothiazide 320-25 MG per tablet  Commonly known as:  DIOVAN-HCT  TAKE ONE TABLET BY MOUTH ONCE DAILY     VOLTAREN 1 % Gel  Generic drug:  diclofenac sodium  APPLY   TOPICALLY TO AFFECTED AREA  4 TIMES DAILY        Allergies:  Allergies  Allergen Reactions  . Cephalexin     Past Medical History  Diagnosis Date  . HYPERLIPIDEMIA NEC/NOS 04/19/2007  . HYPERTENSION 04/07/2007  . ALLERGIC RHINITIS 11/30/2008  . ASTHMA UNSPECIFIED WITH EXACERBATION 02/17/2008  . Esophageal reflux 11/08/2008  . OSTEOARTHRITIS 04/07/2007  . OSTEOPENIA 03/10/2008  . INSOMNIA 08/07/2010  . CAROTID BRUIT, LEFT 04/19/2007  . CLOSED FRACTURE OF METATARSAL BONE 06/04/2009  . ALLERGIC RHINITIS 11/30/2008  . Closed fracture of lateral malleolus 06/04/2009  . GANGLION CYST 04/19/2007  . UTI 07/20/2009    Past Surgical History  Procedure Laterality Date  . Tubal ligation    . Total hip arthroplasty    . Knuckle replacement      Family History  Problem Relation Age of Onset  . Heart disease Mother     Mother also has significant carotid artery stenosis  . COPD Father   . Alcoholism Father   . Cancer Brother   . Alzheimer's disease Sister   . Thyroid  disease Sister     Goiter  . Breast cancer Neg Hx     Social History:  reports that she has been smoking Cigarettes.  She has a 30 pack-year smoking history. She has never used smokeless tobacco. She reports that she does not drink alcohol or use illicit drugs.   Review of Systems:  No history of headaches There is a  history of high blood pressure.   This has been recently high, she is asking about getting a blood pressure monitor to check at home  For the last few years she has very transient episodes of feeling funny.  She feels a wave of something going up and after that she will feel washed out for about 5 minutes.  May have mild lightheadedness with this too   No  history of Diabetes.     No recent change in bowel habits She has had depression for several years and has been on Prozac and other medications  no recent history of edema         Examination:   BP 142/98 mmHg  Pulse 84  Temp(Src) 98 F (36.7 C)  Resp 14  Ht 5' 2.75" (1.594 m)  Wt 179 lb 3.2 oz (81.285 kg)  BMI 31.99 kg/m2  SpO2 97%   General Appearance: pleasant, has mild generalized obesity present          Eyes: No abnormal prominence or eyelid swelling. no lid lag or stare          Neck: The thyroid is alpable on swallowing on the left side laterally, about twice normal.  No distinct nodules felt.  Right side and isthmus are not clearly palpable. There is no stridor. e is no lymphadenopathy .    Cardiovascular: Normal  heart sounds, no murmur Respiratory:  Lungs clear Neurological: REFLEXES: at biceps are normal. 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 Although her free T4 and free T3 levels are in the normal range there appear to be relatively higher than last year She has a TSH in the hyperthyroid range and  also I-131 uptake is above normal Difficult to assess her symptoms since she has underlying anxiety History also She does tend to have  excessive sweating and heat intolerance but only with physical activity She has a history of mild palpitations list but only at times in the day On examination she does not have significant goiter but thyroid is enlarged on her scan  Discussed with the patient that it is difficult to know if she is having symptoms from the thyroid and whether she will be symptomatically better after trying radioactive iodine treatment However since TSH is significantly suppressed and free T3 and free T4 are trending higher she is a candidate for I-131 treatment; her uptake is above normal also Discussed with radioactive iodine treatments and given her information on this We will schedule her to be treated with 30 mCi of I-131   [facility] 08/18/2014

## 2014-08-19 ENCOUNTER — Encounter: Payer: Self-pay | Admitting: Internal Medicine

## 2014-08-21 ENCOUNTER — Encounter: Payer: Self-pay | Admitting: Internal Medicine

## 2014-08-23 ENCOUNTER — Ambulatory Visit (INDEPENDENT_AMBULATORY_CARE_PROVIDER_SITE_OTHER): Payer: Medicare Other | Admitting: Internal Medicine

## 2014-08-23 ENCOUNTER — Encounter: Payer: Self-pay | Admitting: Internal Medicine

## 2014-08-23 VITALS — BP 142/80 | HR 76 | Temp 98.1°F | Ht 62.75 in | Wt 179.0 lb

## 2014-08-23 DIAGNOSIS — E0520 Thyrotoxicosis with toxic multinodular goiter without thyrotoxic crisis or storm: Secondary | ICD-10-CM

## 2014-08-23 DIAGNOSIS — F418 Other specified anxiety disorders: Secondary | ICD-10-CM

## 2014-08-23 DIAGNOSIS — F909 Attention-deficit hyperactivity disorder, unspecified type: Secondary | ICD-10-CM | POA: Diagnosis not present

## 2014-08-23 DIAGNOSIS — M6749 Ganglion, multiple sites: Secondary | ICD-10-CM

## 2014-08-23 DIAGNOSIS — F988 Other specified behavioral and emotional disorders with onset usually occurring in childhood and adolescence: Secondary | ICD-10-CM

## 2014-08-23 DIAGNOSIS — IMO0002 Reserved for concepts with insufficient information to code with codable children: Secondary | ICD-10-CM | POA: Insufficient documentation

## 2014-08-23 NOTE — Assessment & Plan Note (Signed)
Patient has history of ADD. Refer to behavioral health for confirmation testing. Considering her issues with anxiety and toxic multinodular goiter, I suggested patient start to taper Adderall dose.

## 2014-08-23 NOTE — Progress Notes (Signed)
Subjective:    Patient ID: [name], female    DOB: [birth year], 73 y.o.   MRN: [id number]  HPI  73 year old white female with history of hypertension, depression/anxiety and suppressed TSH for follow-up. Interval medical history patient seen by endocrinologist. Thyroid uptake scan suggested toxic multinodular goiter.  She is planning treatment with radioactive iodine.  Depression/anxiety-patient reports persistent anxiety symptoms. This is despite taking Wellbutrin and fluoxetine.  Patient reports history of possible attention deficit disorder. She has been on Adderall for over 5 years.  Review of Systems Insomnia, weight is stable    Past Medical History  Diagnosis Date  . HYPERLIPIDEMIA NEC/NOS 04/19/2007  . HYPERTENSION 04/07/2007  . ALLERGIC RHINITIS 11/30/2008  . ASTHMA UNSPECIFIED WITH EXACERBATION 02/17/2008  . Esophageal reflux 11/08/2008  . OSTEOARTHRITIS 04/07/2007  . OSTEOPENIA 03/10/2008  . INSOMNIA 08/07/2010  . CAROTID BRUIT, LEFT 04/19/2007  . CLOSED FRACTURE OF METATARSAL BONE 06/04/2009  . ALLERGIC RHINITIS 11/30/2008  . Closed fracture of lateral malleolus 06/04/2009  . GANGLION CYST 04/19/2007  . UTI 07/20/2009    History   Social History  . Marital Status: Married    Spouse Name: N/A    Number of Children: N/A  . Years of Education: N/A   Occupational History  . retired    Social History Main Topics  . Smoking status: Former Smoker -- 1.50 packs/day for 20 years    Types: Cigarettes    Quit date: 09/15/1968  . Smokeless tobacco: Never Used     Comment: smoked 1.5 for  20 years  . Alcohol Use: No  . Drug Use: No  . Sexual Activity: Yes   Other Topics Concern  . Not on file   Social History Narrative   Married for 50 years. She has 1 son that age 46   Retired from Environmental consultant          Past Surgical History  Procedure Laterality Date  . Tubal ligation    . Total hip arthroplasty    . Knuckle replacement      Family  History  Problem Relation Age of Onset  . Heart disease Mother     Mother also has significant carotid artery stenosis  . COPD Father   . Alcoholism Father   . Cancer Brother   . Alzheimer's disease Sister   . Thyroid disease Sister     Goiter  . Breast cancer Neg Hx     Allergies  Allergen Reactions  . Cephalexin     Current Outpatient Prescriptions on File Prior to Visit  Medication Sig Dispense Refill  . amphetamine-dextroamphetamine (ADDERALL) 20 MG tablet Take 1 bid and may have extra 1/2 qd prn 75 tablet 0  . aspirin-acetaminophen-caffeine (EXCEDRIN MIGRAINE) 250-250-65 MG per tablet Take 1 tablet by mouth every 6 (six) hours as needed. 30 tablet 0  . buPROPion (WELLBUTRIN XL) 300 MG 24 hr tablet TAKE ONE TABLET BY MOUTH ONCE DAILY 90 tablet 2  . calcium carbonate (TUMS - DOSED IN MG ELEMENTAL CALCIUM) 500 MG chewable tablet Chew 1 tablet by mouth daily.    Marland Kitchen co-enzyme Q-10 30 MG capsule Take 30 mg by mouth 3 (three) times daily.    . cyanocobalamin 500 MCG tablet Take 500 mcg by mouth daily.    Marland Kitchen FLUoxetine (PROZAC) 10 MG capsule TAKE THREE CAPSULES BY MOUTH EVERY DAY 270 capsule 1  . glucosamine-chondroitin 500-400 MG tablet Take 1 tablet by mouth 3 (three) times daily.    . Melatonin  3 MG TABS Take by mouth.    . Multiple Vitamin (MULTIVITAMIN) capsule Take 1 capsule by mouth daily.      . valsartan-hydrochlorothiazide (DIOVAN-HCT) 320-25 MG per tablet TAKE ONE TABLET BY MOUTH ONCE DAILY 90 tablet 2  . VOLTAREN 1 % GEL APPLY   TOPICALLY TO AFFECTED AREA 4 TIMES DAILY 100 g 11   No current facility-administered medications on file prior to visit.    BP 142/80 mmHg  Pulse 76  Temp(Src) 98.1 F (36.7 C) (Oral)  Ht 5' 2.75" (1.594 m)  Wt 179 lb (81.194 kg)  BMI 31.96 kg/m2    Objective:   Physical Exam  Constitutional: She is oriented to person, place, and time. She appears well-developed and well-nourished.  HENT:  Head: Normocephalic and atraumatic.  Eyes:  EOM are normal. Pupils are equal, round, and reactive to light.  Cardiovascular: Normal rate, regular rhythm and normal heart sounds.   Pulmonary/Chest: Effort normal and breath sounds normal. She has no wheezes.  Musculoskeletal:  Cyst - left thumb  Neurological: She is alert and oriented to person, place, and time. No cranial nerve deficit.  Psychiatric:  Appears slightly anxious          Assessment & Plan:

## 2014-08-23 NOTE — Assessment & Plan Note (Signed)
Hold off on medication changes until patient treated for toxic multinodular goiter.

## 2014-08-23 NOTE — Assessment & Plan Note (Signed)
Patient has recurrent cyst on her left thumb.  Surgical excision attempted by Dr. Arnoldo Morale in the past. Refer to hand specialists for excision.

## 2014-08-23 NOTE — Progress Notes (Signed)
Pre visit review using our clinic review tool, if applicable. No additional management support is needed unless otherwise documented below in the visit note. 

## 2014-08-23 NOTE — Assessment & Plan Note (Signed)
Appreciate endo consult.  Patient scheduled for radioactive iodine ablation thyroid gland.

## 2014-08-23 NOTE — Patient Instructions (Signed)
Try to taper dose of Adderall as directed

## 2014-08-28 ENCOUNTER — Encounter: Payer: Self-pay | Admitting: Internal Medicine

## 2014-09-04 ENCOUNTER — Telehealth: Payer: Self-pay | Admitting: Endocrinology

## 2014-09-04 ENCOUNTER — Encounter: Payer: Self-pay | Admitting: Endocrinology

## 2014-09-04 NOTE — Telephone Encounter (Signed)
Patient has questions about her insurance coverage concerning her medication.

## 2014-09-12 ENCOUNTER — Telehealth: Payer: Self-pay | Admitting: Endocrinology

## 2014-09-12 NOTE — Telephone Encounter (Signed)
Patient has a concern about the RAI treatment. Please call patient and explain why she is having this done    Thank you

## 2014-09-13 ENCOUNTER — Other Ambulatory Visit: Payer: Self-pay | Admitting: Internal Medicine

## 2014-09-13 ENCOUNTER — Encounter (HOSPITAL_COMMUNITY): Payer: Medicare Other

## 2014-09-18 ENCOUNTER — Encounter: Payer: Self-pay | Admitting: Internal Medicine

## 2014-09-18 DIAGNOSIS — E0520 Thyrotoxicosis with toxic multinodular goiter without thyrotoxic crisis or storm: Secondary | ICD-10-CM

## 2014-09-19 ENCOUNTER — Encounter (HOSPITAL_COMMUNITY): Payer: Medicare Other

## 2014-09-27 ENCOUNTER — Encounter (HOSPITAL_COMMUNITY): Payer: Self-pay

## 2014-09-27 ENCOUNTER — Encounter (HOSPITAL_COMMUNITY)
Admission: RE | Admit: 2014-09-27 | Discharge: 2014-09-27 | Disposition: A | Discharge status: Home / Self Care | Payer: Medicare Other | Source: Ambulatory Visit | Attending: Endocrinology | Admitting: Endocrinology

## 2014-09-27 DIAGNOSIS — E0590 Thyrotoxicosis, unspecified without thyrotoxic crisis or storm: Secondary | ICD-10-CM | POA: Diagnosis not present

## 2014-09-27 DIAGNOSIS — E0520 Thyrotoxicosis with toxic multinodular goiter without thyrotoxic crisis or storm: Secondary | ICD-10-CM | POA: Diagnosis not present

## 2014-09-27 MED ORDER — SODIUM IODIDE I 131 CAPSULE
30.0000 | Freq: Once | INTRAVENOUS | Status: AC | PRN
  Administered 2014-09-27: 30 via ORAL

## 2014-09-29 ENCOUNTER — Ambulatory Visit: Payer: Medicare Other | Admitting: Endocrinology

## 2014-10-02 ENCOUNTER — Other Ambulatory Visit: Payer: Self-pay | Admitting: Internal Medicine

## 2014-10-03 ENCOUNTER — Telehealth: Payer: Self-pay | Admitting: Internal Medicine

## 2014-10-03 NOTE — Telephone Encounter (Signed)
Temelec for 3 prescriptions.  I don't think pharmacy with dispense 3 month supply

## 2014-10-03 NOTE — Telephone Encounter (Signed)
Pt request refill amphetamine-dextroamphetamine (ADDERALL) 20 MG tablet °3 mo supply °

## 2014-10-04 MED ORDER — AMPHETAMINE-DEXTROAMPHETAMINE 20 MG PO TABS: ORAL_TABLET | ORAL | Status: DC

## 2014-10-04 NOTE — Telephone Encounter (Signed)
rx's up front for p/u

## 2014-10-21 ENCOUNTER — Encounter: Payer: Self-pay | Admitting: Endocrinology

## 2014-10-25 ENCOUNTER — Ambulatory Visit (INDEPENDENT_AMBULATORY_CARE_PROVIDER_SITE_OTHER): Payer: Medicare Other | Admitting: Internal Medicine

## 2014-10-25 ENCOUNTER — Encounter: Payer: Self-pay | Admitting: Internal Medicine

## 2014-10-25 VITALS — BP 132/84 | Temp 97.9°F | Wt 180.0 lb

## 2014-10-25 DIAGNOSIS — K219 Gastro-esophageal reflux disease without esophagitis: Secondary | ICD-10-CM | POA: Diagnosis not present

## 2014-10-25 DIAGNOSIS — F988 Other specified behavioral and emotional disorders with onset usually occurring in childhood and adolescence: Secondary | ICD-10-CM

## 2014-10-25 DIAGNOSIS — E0520 Thyrotoxicosis with toxic multinodular goiter without thyrotoxic crisis or storm: Secondary | ICD-10-CM

## 2014-10-25 DIAGNOSIS — F909 Attention-deficit hyperactivity disorder, unspecified type: Secondary | ICD-10-CM

## 2014-10-25 MED ORDER — FLUOXETINE HCL 10 MG PO CAPS: ORAL_CAPSULE | ORAL | Status: DC

## 2014-10-25 MED ORDER — VALSARTAN-HYDROCHLOROTHIAZIDE 320-25 MG PO TABS: 1.0000 | ORAL_TABLET | Freq: Every day | ORAL | Status: DC

## 2014-10-25 MED ORDER — BUPROPION HCL ER (XL) 300 MG PO TB24: 300.0000 mg | ORAL_TABLET | Freq: Every day | ORAL | Status: DC

## 2014-10-25 MED ORDER — RANITIDINE HCL 150 MG PO TABS: 150.0000 mg | ORAL_TABLET | Freq: Two times a day (BID) | ORAL | Status: DC | PRN

## 2014-10-25 NOTE — Progress Notes (Signed)
Subjective:    Patient ID: [name], female    DOB: [birth year], 74 y.o.   MRN: [id number]  HPI  74 year old white female previously seen for toxic multinodular goiter, Depression and possible ADD for follow-up.  Interval medical history-patient try to get second opinion from Dr. Forde Dandy regarding radioactive iodine ablation of thyroid. Patient reports she was unable to schedule a visit but decided to proceed with procedure. She has follow-up with her endocrinologist in 2 weeks.  Patient has not been seen by behavioral health for ADD testing.  Patient complains of intermittent reflux symptoms. She has been using over-the-counter Pepcid but was told by her psychiatrist discontinued due to possible drug interactions with her other medications.   Review of Systems Negative for weight changes, mild fatigue    Past Medical History  Diagnosis Date  . HYPERLIPIDEMIA NEC/NOS 04/19/2007  . HYPERTENSION 04/07/2007  . ALLERGIC RHINITIS 11/30/2008  . ASTHMA UNSPECIFIED WITH EXACERBATION 02/17/2008  . Esophageal reflux 11/08/2008  . OSTEOARTHRITIS 04/07/2007  . OSTEOPENIA 03/10/2008  . INSOMNIA 08/07/2010  . CAROTID BRUIT, LEFT 04/19/2007  . CLOSED FRACTURE OF METATARSAL BONE 06/04/2009  . ALLERGIC RHINITIS 11/30/2008  . Closed fracture of lateral malleolus 06/04/2009  . GANGLION CYST 04/19/2007  . UTI 07/20/2009    History   Social History  . Marital Status: Married    Spouse Name: N/A  . Number of Children: N/A  . Years of Education: N/A   Occupational History  . retired    Social History Main Topics  . Smoking status: Former Smoker -- 1.50 packs/day for 20 years    Types: Cigarettes    Quit date: 09/15/1968  . Smokeless tobacco: Never Used     Comment: smoked 1.5 for  20 years  . Alcohol Use: No  . Drug Use: No  . Sexual Activity: Yes   Other Topics Concern  . Not on file   Social History Narrative   Married for 50 years. She has 1 son that age 41   Retired from Copywriter, advertising          Past Surgical History  Procedure Laterality Date  . Tubal ligation    . Total hip arthroplasty    . Knuckle replacement      Family History  Problem Relation Age of Onset  . Heart disease Mother     Mother also has significant carotid artery stenosis  . COPD Father   . Alcoholism Father   . Cancer Brother   . Alzheimer's disease Sister   . Thyroid disease Sister     Goiter  . Breast cancer Neg Hx     Allergies  Allergen Reactions  . Cephalexin     Current Outpatient Prescriptions on File Prior to Visit  Medication Sig Dispense Refill  . amphetamine-dextroamphetamine (ADDERALL) 20 MG tablet Take 1 bid and may have extra 1/2 qd prn 75 tablet 0  . aspirin-acetaminophen-caffeine (EXCEDRIN MIGRAINE) 250-250-65 MG per tablet Take 1 tablet by mouth every 6 (six) hours as needed. 30 tablet 0  . calcium carbonate (TUMS - DOSED IN MG ELEMENTAL CALCIUM) 500 MG chewable tablet Chew 1 tablet by mouth daily.    Marland Kitchen co-enzyme Q-10 30 MG capsule Take 30 mg by mouth 3 (three) times daily.    . cyanocobalamin 500 MCG tablet Take 500 mcg by mouth daily.    Marland Kitchen glucosamine-chondroitin 500-400 MG tablet Take 1 tablet by mouth 3 (three) times daily.    . Melatonin 3 MG TABS  Take by mouth.    . Multiple Vitamin (MULTIVITAMIN) capsule Take 1 capsule by mouth daily.      . VOLTAREN 1 % GEL APPLY   TOPICALLY TO AFFECTED AREA 4 TIMES DAILY 100 g 11   No current facility-administered medications on file prior to visit.    BP 132/84 mmHg  Temp(Src) 97.9 F (36.6 C) (Oral)  Wt 180 lb (81.647 kg)    Objective:   Physical Exam  Constitutional: She is oriented to person, place, and time. She appears well-developed and well-nourished. No distress.  HENT:  Head: Normocephalic and atraumatic.  Cardiovascular: Normal rate, regular rhythm and normal heart sounds.   No murmur heard. Pulmonary/Chest: Effort normal and breath sounds normal. She has no wheezes.    Neurological: She is alert and oriented to person, place, and time. No cranial nerve deficit.  Psychiatric: She has a normal mood and affect. Her behavior is normal.          Assessment & Plan:

## 2014-10-25 NOTE — Assessment & Plan Note (Signed)
Status post radioactive iodine ablation. She has follow-up with endocrinology.

## 2014-10-25 NOTE — Progress Notes (Signed)
Pre visit review using our clinic review tool, if applicable. No additional management support is needed unless otherwise documented below in the visit note. 

## 2014-10-25 NOTE — Assessment & Plan Note (Signed)
Discontinue Pepcid.  Use ranitidine 150 mg bid prn.

## 2014-10-25 NOTE — Assessment & Plan Note (Signed)
Still awaiting referral to behavioral health. We discussed slowly tapering off Adderall.

## 2014-10-31 ENCOUNTER — Telehealth: Payer: Self-pay | Admitting: Endocrinology

## 2014-10-31 NOTE — Telephone Encounter (Signed)
Orders faxed per request. 

## 2014-10-31 NOTE — Telephone Encounter (Signed)
Pt wants to have labs at [facility] they need orders faxed # [phone] attn: Cheri Rous in lab

## 2014-11-03 ENCOUNTER — Other Ambulatory Visit: Payer: Self-pay | Admitting: *Deleted

## 2014-11-03 ENCOUNTER — Telehealth: Payer: Self-pay | Admitting: Endocrinology

## 2014-11-03 DIAGNOSIS — E0520 Thyrotoxicosis with toxic multinodular goiter without thyrotoxic crisis or storm: Secondary | ICD-10-CM

## 2014-11-03 NOTE — Telephone Encounter (Signed)
Lab orders faxed to [facility]

## 2014-11-03 NOTE — Telephone Encounter (Signed)
Pt needs lab orders sent to [facility] instead # [phone]

## 2014-11-04 ENCOUNTER — Other Ambulatory Visit (HOSPITAL_COMMUNITY)
Admission: RE | Admit: 2014-11-04 | Discharge: 2014-11-04 | Disposition: A | Discharge status: Home / Self Care | Payer: Medicare Other | Source: Other Acute Inpatient Hospital | Attending: Endocrinology | Admitting: Endocrinology

## 2014-11-04 DIAGNOSIS — E0520 Thyrotoxicosis with toxic multinodular goiter without thyrotoxic crisis or storm: Secondary | ICD-10-CM | POA: Diagnosis not present

## 2014-11-04 DIAGNOSIS — R7989 Other specified abnormal findings of blood chemistry: Secondary | ICD-10-CM | POA: Insufficient documentation

## 2014-11-05 LAB — T4, FREE: Free T4: 0.95 ng/dL (ref 0.80–1.80)

## 2014-11-07 ENCOUNTER — Encounter: Payer: Self-pay | Admitting: Internal Medicine

## 2014-11-07 ENCOUNTER — Telehealth: Payer: Self-pay | Admitting: *Deleted

## 2014-11-07 NOTE — Telephone Encounter (Signed)
Pt stated she is suppose to go for ADHD testing but she was wondering if she should get her thyroid straightened out before getting the ADHD test done.  Please advise

## 2014-11-07 NOTE — Telephone Encounter (Signed)
No, she can still go for ADD testing.

## 2014-11-07 NOTE — Telephone Encounter (Signed)
Pt aware.

## 2014-11-08 ENCOUNTER — Ambulatory Visit (INDEPENDENT_AMBULATORY_CARE_PROVIDER_SITE_OTHER): Payer: Medicare Other | Admitting: Endocrinology

## 2014-11-08 ENCOUNTER — Encounter: Payer: Self-pay | Admitting: Endocrinology

## 2014-11-08 VITALS — BP 142/90 | HR 80 | Temp 97.9°F | Ht 62.75 in | Wt 185.0 lb

## 2014-11-08 DIAGNOSIS — E0520 Thyrotoxicosis with toxic multinodular goiter without thyrotoxic crisis or storm: Secondary | ICD-10-CM

## 2014-11-08 NOTE — Progress Notes (Signed)
Patient ID: [name], female   DOB: Feb 01, 1941, 74 y.o.   MRN: [id number]    Reason for Appointment:  Follow-up of thyroid    History of Present Illness:   Previous history: She apparently has had a low TSH level as far back as 7 years ago  She gives a history of tending to get hot and sweaty with physical activity but this has been going on for the last 5 years. She does not have any heat intolerance or sweating episodes otherwise including at night She may feel palpitations occasionally but this is mostly after her morning coffee and her Adderall medication Does not think she has any shakiness of her hands but inside me at times feel Trembly She has had long history of anxiety and depression  She does complain of feeling fatigue which is not new  No history of significant weight loss  Because of her symptoms and TSH in the hyperthyroid range as well as uptake of 34% she was given I-131 treatment with 30 mCi on 09/27/14. Since then she has been not felt any dramatic difference in her symptoms She still gets tired except for a couple of days when she felt good She has not noticed palpitations as consistently and also is not sweating with her activities. Still has symptoms related to anxiety especially when drinking coffee   Wt Readings from Last 3 Encounters:  11/08/14 185 lb (83.915 kg)  10/25/14 180 lb (81.647 kg)  08/23/14 179 lb (81.194 kg)    Lab Results  Component Value Date   TSH 0.06* 07/05/2014   TSH 0.04* 12/26/2013   TSH 0.05* 03/21/2013   FREET4 0.95 11/04/2014   FREET4 1.14 07/10/2014   FREET4 0.89 03/21/2013    She was also sent for a thyroid scan and uptake which showed the I-131 uptake to be 34%, above normal and the scan did not show any hot or cold areas but no detailed description is available       Medication List       This list is accurate as of: 11/08/14  3:08 PM.  Always use your most recent med list.               amphetamine-dextroamphetamine 20 MG tablet  Commonly known as:  ADDERALL  Take 1 bid and may have extra 1/2 qd prn     aspirin-acetaminophen-caffeine 250-250-65 MG per tablet  Commonly known as:  EXCEDRIN MIGRAINE  Take 1 tablet by mouth every 6 (six) hours as needed.     buPROPion 300 MG 24 hr tablet  Commonly known as:  WELLBUTRIN XL  Take 1 tablet (300 mg total) by mouth daily.     calcium carbonate 500 MG chewable tablet  Commonly known as:  TUMS - dosed in mg elemental calcium  Chew 1 tablet by mouth daily.     co-enzyme Q-10 30 MG capsule  Take 30 mg by mouth 3 (three) times daily.     cyanocobalamin 500 MCG tablet  Take 500 mcg by mouth daily.     FLUoxetine 10 MG capsule  Commonly known as:  PROZAC  TAKE THREE CAPSULES BY MOUTH EVERY DAY     glucosamine-chondroitin 500-400 MG tablet  Take 1 tablet by mouth 3 (three) times daily.     Melatonin 3 MG Tabs  Take by mouth.     multivitamin capsule  Take 1 capsule by mouth daily.     ranitidine 150 MG tablet  Commonly known as:  ZANTAC  Take 1 tablet (150 mg total) by mouth 2 (two) times daily as needed for heartburn.     valsartan-hydrochlorothiazide 320-25 MG per tablet  Commonly known as:  DIOVAN-HCT  Take 1 tablet by mouth daily.     VOLTAREN 1 % Gel  Generic drug:  diclofenac sodium  APPLY   TOPICALLY TO AFFECTED AREA 4 TIMES DAILY        Allergies:  Allergies  Allergen Reactions  . Cephalexin     Past Medical History  Diagnosis Date  . HYPERLIPIDEMIA NEC/NOS 04/19/2007  . HYPERTENSION 04/07/2007  . ALLERGIC RHINITIS 11/30/2008  . ASTHMA UNSPECIFIED WITH EXACERBATION 02/17/2008  . Esophageal reflux 11/08/2008  . OSTEOARTHRITIS 04/07/2007  . OSTEOPENIA 03/10/2008  . INSOMNIA 08/07/2010  . CAROTID BRUIT, LEFT 04/19/2007  . CLOSED FRACTURE OF METATARSAL BONE 06/04/2009  . ALLERGIC RHINITIS 11/30/2008  . Closed fracture of lateral malleolus 06/04/2009  . GANGLION CYST 04/19/2007  . UTI 07/20/2009    Past  Surgical History  Procedure Laterality Date  . Tubal ligation    . Total hip arthroplasty    . Knuckle replacement      Family History  Problem Relation Age of Onset  . Heart disease Mother     Mother also has significant carotid artery stenosis  . COPD Father   . Alcoholism Father   . Cancer Brother   . Alzheimer's disease Sister   . Thyroid disease Sister     Goiter  . Breast cancer Neg Hx     Social History:  reports that she quit smoking about 46 years ago. Her smoking use included Cigarettes. She has a 30 pack-year smoking history. She has never used smokeless tobacco. She reports that she does not drink alcohol or use illicit drugs.   Review of Systems:  There is a history of high blood pressure treated by PCP .     For the last few years she has very transient episodes of feeling funny.  She feels a wave of something going up and after that she will feel washed out for about 5 minutes.  May have mild lightheadedness with this too        Examination:   BP 142/90 mmHg  Pulse 105  Temp(Src) 97.9 F (36.6 C) (Oral)  Ht 5' 2.75" (1.594 m)  Wt 185 lb (83.915 kg)  BMI 33.03 kg/m2  SpO2 95%  Repeat pulse 80         The thyroid is  just palpable on swallowing on the left side laterally and is only minimally enlarged.  No distinct nodules felt.  Right side and isthmus are not palpable.   Neurological: REFLEXES: at biceps are normal. She does Not have any tremor   Assessment/Plan:  She has subclinical hyperthyroidism secondary to toxic nodular goiter Although her free T4 and free T3 levels at baseline were in the upper normal range  they were relatively higher tha nin 2014 Her free T4 is slightly lower after I-131 treatment Difficult to assess her symptoms although she may be slightly better with her sweating and palpitations  Also not able to feel her thyroid as easily as on her initial visit She will follow-up in about 2 months and recheck thyroid levels at that  time. Unlikely that she will become hypothyroid since she has a multinodular goiter but discussed the small possibility of this also She will follow-up with PCP for her multiple other medical problems   [facility] 11/08/2014

## 2014-11-20 ENCOUNTER — Ambulatory Visit (INDEPENDENT_AMBULATORY_CARE_PROVIDER_SITE_OTHER): Payer: Medicare Other | Admitting: Psychology

## 2014-11-20 DIAGNOSIS — F411 Generalized anxiety disorder: Secondary | ICD-10-CM | POA: Diagnosis not present

## 2014-11-20 DIAGNOSIS — F329 Major depressive disorder, single episode, unspecified: Secondary | ICD-10-CM

## 2014-12-01 ENCOUNTER — Ambulatory Visit (INDEPENDENT_AMBULATORY_CARE_PROVIDER_SITE_OTHER): Payer: Medicare Other | Admitting: Psychology

## 2014-12-01 DIAGNOSIS — F411 Generalized anxiety disorder: Secondary | ICD-10-CM | POA: Diagnosis not present

## 2014-12-01 DIAGNOSIS — F329 Major depressive disorder, single episode, unspecified: Secondary | ICD-10-CM

## 2014-12-04 ENCOUNTER — Encounter (HOSPITAL_COMMUNITY): Payer: Self-pay | Admitting: Emergency Medicine

## 2014-12-04 ENCOUNTER — Emergency Department (HOSPITAL_COMMUNITY)
Admission: EM | Admit: 2014-12-04 | Discharge: 2014-12-04 | Disposition: A | Discharge status: Home / Self Care | Payer: Medicare Other | Attending: Emergency Medicine | Admitting: Emergency Medicine

## 2014-12-04 DIAGNOSIS — M1990 Unspecified osteoarthritis, unspecified site: Secondary | ICD-10-CM | POA: Insufficient documentation

## 2014-12-04 DIAGNOSIS — Z87440 Personal history of urinary (tract) infections: Secondary | ICD-10-CM | POA: Diagnosis not present

## 2014-12-04 DIAGNOSIS — S61219A Laceration without foreign body of unspecified finger without damage to nail, initial encounter: Secondary | ICD-10-CM

## 2014-12-04 DIAGNOSIS — Y92007 Garden or yard of unspecified non-institutional (private) residence as the place of occurrence of the external cause: Secondary | ICD-10-CM | POA: Insufficient documentation

## 2014-12-04 DIAGNOSIS — Z8781 Personal history of (healed) traumatic fracture: Secondary | ICD-10-CM | POA: Insufficient documentation

## 2014-12-04 DIAGNOSIS — Y9389 Activity, other specified: Secondary | ICD-10-CM | POA: Diagnosis not present

## 2014-12-04 DIAGNOSIS — Z23 Encounter for immunization: Secondary | ICD-10-CM | POA: Diagnosis not present

## 2014-12-04 DIAGNOSIS — Z79899 Other long term (current) drug therapy: Secondary | ICD-10-CM | POA: Insufficient documentation

## 2014-12-04 DIAGNOSIS — Y998 Other external cause status: Secondary | ICD-10-CM | POA: Insufficient documentation

## 2014-12-04 DIAGNOSIS — Z87891 Personal history of nicotine dependence: Secondary | ICD-10-CM | POA: Insufficient documentation

## 2014-12-04 DIAGNOSIS — K219 Gastro-esophageal reflux disease without esophagitis: Secondary | ICD-10-CM | POA: Diagnosis not present

## 2014-12-04 DIAGNOSIS — I10 Essential (primary) hypertension: Secondary | ICD-10-CM | POA: Insufficient documentation

## 2014-12-04 DIAGNOSIS — Z8669 Personal history of other diseases of the nervous system and sense organs: Secondary | ICD-10-CM | POA: Insufficient documentation

## 2014-12-04 DIAGNOSIS — Z8639 Personal history of other endocrine, nutritional and metabolic disease: Secondary | ICD-10-CM | POA: Diagnosis not present

## 2014-12-04 DIAGNOSIS — J45909 Unspecified asthma, uncomplicated: Secondary | ICD-10-CM | POA: Diagnosis not present

## 2014-12-04 DIAGNOSIS — Y288XXA Contact with other sharp object, undetermined intent, initial encounter: Secondary | ICD-10-CM | POA: Diagnosis not present

## 2014-12-04 DIAGNOSIS — S61213A Laceration without foreign body of left middle finger without damage to nail, initial encounter: Secondary | ICD-10-CM | POA: Diagnosis not present

## 2014-12-04 MED ORDER — BACITRACIN 500 UNIT/GM EX OINT
1.0000 "application " | TOPICAL_OINTMENT | Freq: Two times a day (BID) | CUTANEOUS | Status: DC
  Administered 2014-12-04: 1 via TOPICAL
  Filled 2014-12-04 (×5): qty 0.9

## 2014-12-04 MED ORDER — LIDOCAINE HCL (PF) 1 % IJ SOLN
5.0000 mL | Freq: Once | INTRAMUSCULAR | Status: AC
  Administered 2014-12-04: 5 mL via INTRADERMAL
  Filled 2014-12-04: qty 5

## 2014-12-04 MED ORDER — IBUPROFEN 800 MG PO TABS: 800.0000 mg | ORAL_TABLET | Freq: Three times a day (TID) | ORAL | Status: DC

## 2014-12-04 MED ORDER — IBUPROFEN 800 MG PO TABS
800.0000 mg | ORAL_TABLET | Freq: Once | ORAL | Status: AC
  Administered 2014-12-04: 800 mg via ORAL
  Filled 2014-12-04: qty 1

## 2014-12-04 MED ORDER — TETANUS-DIPHTH-ACELL PERTUSSIS 5-2.5-18.5 LF-MCG/0.5 IM SUSP
0.5000 mL | Freq: Once | INTRAMUSCULAR | Status: AC
  Administered 2014-12-04: 0.5 mL via INTRAMUSCULAR
  Filled 2014-12-04: qty 0.5

## 2014-12-04 NOTE — ED Notes (Signed)
Pt reports cut left middle finger while trying to trim some flowers in her yard. Moderate laceration noted to left middle finger. Minimal bleeding noted to site.

## 2014-12-04 NOTE — Discharge Instructions (Signed)

## 2014-12-06 NOTE — ED Provider Notes (Signed)
CSN: [id number]     Arrival date & time 12/04/14  1759 History   First MD Initiated Contact with Patient 12/04/14 2044     Chief Complaint  Patient presents with  . Extremity Laceration     (Consider location/radiation/quality/duration/timing/severity/associated sxs/prior Treatment) HPI   [name] is a 74 y.o. female who presents to the Emergency Department complaining of laceration to her left middle finger that occurred while trimming flowers in her yard.  She denies excessive bleeding.  She states she tried to clean the wound with water.  Last Td is unknown.  She denies numbness or swelling or her finger or anticoagulant use.     Past Medical History  Diagnosis Date  . HYPERLIPIDEMIA NEC/NOS 04/19/2007  . HYPERTENSION 04/07/2007  . ALLERGIC RHINITIS 11/30/2008  . ASTHMA UNSPECIFIED WITH EXACERBATION 02/17/2008  . Esophageal reflux 11/08/2008  . OSTEOARTHRITIS 04/07/2007  . OSTEOPENIA 03/10/2008  . INSOMNIA 08/07/2010  . CAROTID BRUIT, LEFT 04/19/2007  . CLOSED FRACTURE OF METATARSAL BONE 06/04/2009  . ALLERGIC RHINITIS 11/30/2008  . Closed fracture of lateral malleolus 06/04/2009  . GANGLION CYST 04/19/2007  . UTI 07/20/2009   Past Surgical History  Procedure Laterality Date  . Tubal ligation    . Total hip arthroplasty    . Knuckle replacement     Family History  Problem Relation Age of Onset  . Heart disease Mother     Mother also has significant carotid artery stenosis  . COPD Father   . Alcoholism Father   . Cancer Brother   . Alzheimer's disease Sister   . Thyroid disease Sister     Goiter  . Breast cancer Neg Hx    History  Substance Use Topics  . Smoking status: Former Smoker -- 1.50 packs/day for 20 years    Types: Cigarettes    Quit date: 09/15/1968  . Smokeless tobacco: Never Used     Comment: smoked 1.5 for  20 years  . Alcohol Use: No   OB History    No data available     Review of Systems  Constitutional: Negative for fever and chills.   Musculoskeletal: Negative for back pain, joint swelling and arthralgias.  Skin: Positive for wound (left middle finger laceration).       Laceration   Neurological: Negative for dizziness, weakness and numbness.  Hematological: Does not bruise/bleed easily.  All other systems reviewed and are negative.     Allergies  Cephalexin  Home Medications   Prior to Admission medications   Medication Sig Start Date End Date Taking? Authorizing Provider  amphetamine-dextroamphetamine (ADDERALL) 20 MG tablet Take 1 bid and may have extra 1/2 qd prn 10/04/14   Doe-Hyun R Shawna Orleans, DO  aspirin-acetaminophen-caffeine (EXCEDRIN MIGRAINE) [phone] MG per tablet Take 1 tablet by mouth every 6 (six) hours as needed. 09/02/13   Ricard Dillon, MD  buPROPion (WELLBUTRIN XL) 300 MG 24 hr tablet Take 1 tablet (300 mg total) by mouth daily. 10/25/14   Doe-Hyun R Shawna Orleans, DO  calcium carbonate (TUMS - DOSED IN MG ELEMENTAL CALCIUM) 500 MG chewable tablet Chew 1 tablet by mouth daily.    Historical Provider, MD  co-enzyme Q-10 30 MG capsule Take 30 mg by mouth 3 (three) times daily.    Historical Provider, MD  cyanocobalamin 500 MCG tablet Take 500 mcg by mouth daily.    Historical Provider, MD  FLUoxetine (PROZAC) 10 MG capsule TAKE THREE CAPSULES BY MOUTH EVERY DAY 10/25/14   Doe-Hyun Kyra Searles, DO  glucosamine-chondroitin 500-400 MG tablet Take 1 tablet by mouth 3 (three) times daily.    Historical Provider, MD  ibuprofen (ADVIL,MOTRIN) 800 MG tablet Take 1 tablet (800 mg total) by mouth 3 (three) times daily. 12/04/14   Tammi Seleen Walter, PA-C  Melatonin 3 MG TABS Take by mouth.    Historical Provider, MD  Multiple Vitamin (MULTIVITAMIN) capsule Take 1 capsule by mouth daily.      Historical Provider, MD  ranitidine (ZANTAC) 150 MG tablet Take 1 tablet (150 mg total) by mouth 2 (two) times daily as needed for heartburn. 10/25/14   Doe-Hyun R Shawna Orleans, DO  valsartan-hydrochlorothiazide (DIOVAN-HCT) 320-25 MG per tablet Take 1  tablet by mouth daily. 10/25/14   Doe-Hyun R Shawna Orleans, DO  VOLTAREN 1 % GEL APPLY   TOPICALLY TO AFFECTED AREA 4 TIMES DAILY 06/08/13   Ricard Dillon, MD   BP 155/99 mmHg  Pulse 87  Temp(Src) 97.5 F (36.4 C) (Oral)  Resp 20  Ht 5\' 4"  (1.626 m)  Wt 180 lb (81.647 kg)  BMI 30.88 kg/m2  SpO2 99% Physical Exam  Constitutional: She is oriented to person, place, and time. She appears well-developed and well-nourished. No distress.  HENT:  Head: Normocephalic and atraumatic.  Cardiovascular: Normal rate, regular rhythm, normal heart sounds and intact distal pulses.   No murmur heard. Pulmonary/Chest: Effort normal and breath sounds normal. No respiratory distress.  Musculoskeletal: She exhibits no edema or tenderness.  Neurological: She is alert and oriented to person, place, and time. She exhibits normal muscle tone. Coordination normal.  Skin: Skin is warm. Laceration noted.  Laceration to the distal tip of the left middle finger.  Bleeding controlled, no edema.  Distal sensation intact.  Pt has full ROM of the finger.   Nursing note and vitals reviewed.   ED Course  Procedures (including critical care time) Labs Review Labs Reviewed - No data to display  Imaging Review No results found.   EKG Interpretation None      LACERATION REPAIR Performed by: Dois Juarbe L. Authorized by: Hale Bogus Consent: Verbal consent obtained. Risks and benefits: risks, benefits and alternatives were discussed Consent given by: patient Patient identity confirmed: provided demographic data Prepped and Draped in normal sterile fashion Wound explored  Laceration Location: left middle finger  Laceration Length: 1.5 cm   No Foreign Bodies seen or palpated  Anesthesia: local infiltration  Local anesthetic: lidocaine 1% w/o epinephrine  Anesthetic total: 2 ml  Irrigation method: syringe Amount of cleaning: standard  Skin closure: 4-0 prolene Number of sutures: 4  Technique:  simple interrupted Patient tolerance: Patient tolerated the procedure well with no immediate complications.  MDM   Final diagnoses:  Laceration of finger, initial encounter    Wound was cleaned and irrigated thoroughly.  Edges well approximated.  Pt agrees to wound care instructions, sutures out in 8-10 days and to return here for any signs of infection.  Remains NV intact    Patrice Paradise, PA-C 12/06/14 [place], MD 12/06/14 1342

## 2014-12-11 ENCOUNTER — Encounter (INDEPENDENT_AMBULATORY_CARE_PROVIDER_SITE_OTHER): Payer: Self-pay | Admitting: *Deleted

## 2014-12-15 ENCOUNTER — Ambulatory Visit (INDEPENDENT_AMBULATORY_CARE_PROVIDER_SITE_OTHER): Payer: Medicare Other | Admitting: Internal Medicine

## 2014-12-15 ENCOUNTER — Encounter: Payer: Self-pay | Admitting: Internal Medicine

## 2014-12-15 VITALS — BP 140/98 | HR 84 | Temp 98.1°F | Wt 180.0 lb

## 2014-12-15 DIAGNOSIS — F909 Attention-deficit hyperactivity disorder, unspecified type: Secondary | ICD-10-CM

## 2014-12-15 DIAGNOSIS — I10 Essential (primary) hypertension: Secondary | ICD-10-CM | POA: Diagnosis not present

## 2014-12-15 DIAGNOSIS — F988 Other specified behavioral and emotional disorders with onset usually occurring in childhood and adolescence: Secondary | ICD-10-CM

## 2014-12-15 DIAGNOSIS — E0520 Thyrotoxicosis with toxic multinodular goiter without thyrotoxic crisis or storm: Secondary | ICD-10-CM | POA: Diagnosis not present

## 2014-12-15 DIAGNOSIS — S61219D Laceration without foreign body of unspecified finger without damage to nail, subsequent encounter: Secondary | ICD-10-CM | POA: Diagnosis not present

## 2014-12-15 DIAGNOSIS — S61219A Laceration without foreign body of unspecified finger without damage to nail, initial encounter: Secondary | ICD-10-CM | POA: Insufficient documentation

## 2014-12-15 MED ORDER — AMPHETAMINE-DEXTROAMPHETAMINE 20 MG PO TABS: 20.0000 mg | ORAL_TABLET | Freq: Two times a day (BID) | ORAL | Status: DC

## 2014-12-15 NOTE — Assessment & Plan Note (Signed)
74 year old white female suffered from laceration pruning her bushes 12 days ago. Area has healed well. Utilized sterile technique and suture removal kit to remove 4 sutures. No wound dehiscence.  Keep area clean and use band aid daily to keep area covered for 5-7 days.

## 2014-12-15 NOTE — Assessment & Plan Note (Addendum)
Managed by endocrinology.

## 2014-12-15 NOTE — Progress Notes (Signed)
Subjective:    Patient ID: [name], female    DOB: 1941-07-03, 74 y.o.   MRN: [id number]  HPI  74 year old white female with history of toxic multinodular goiter, hypertension and anxiety/depression for follow-up. Patient seen at emergency room 2 weeks ago after suffering a laceration to pad of middle finger of left hand. It required 4 stitches. Area has healed nicely. No redness or tenderness. Patient was given tetanus vaccine.  Interval medical history-patient followed by endocrinology. Her thyroid getting closer to being regulated.  Hypertension-patient reports monitoring her blood pressure at home. She has intermittent elevated blood pressure readings. Her diastolic blood pressure is often in the 90s.  Patient seen by psychiatry for evaluation of ADD. Behavioral Health felt her symptoms secondary to anxiety/depression versus ADD.She is working towards tapering Adderall dose.  Review of Systems Negative for chest pain, negative for redness or pain around suture site.    Past Medical History  Diagnosis Date  . HYPERLIPIDEMIA NEC/NOS 04/19/2007  . HYPERTENSION 04/07/2007  . ALLERGIC RHINITIS 11/30/2008  . ASTHMA UNSPECIFIED WITH EXACERBATION 02/17/2008  . Esophageal reflux 11/08/2008  . OSTEOARTHRITIS 04/07/2007  . OSTEOPENIA 03/10/2008  . INSOMNIA 08/07/2010  . CAROTID BRUIT, LEFT 04/19/2007  . CLOSED FRACTURE OF METATARSAL BONE 06/04/2009  . ALLERGIC RHINITIS 11/30/2008  . Closed fracture of lateral malleolus 06/04/2009  . GANGLION CYST 04/19/2007  . UTI 07/20/2009    History   Social History  . Marital Status: Married    Spouse Name: N/A  . Number of Children: N/A  . Years of Education: N/A   Occupational History  . retired    Social History Main Topics  . Smoking status: Former Smoker -- 1.50 packs/day for 20 years    Types: Cigarettes    Quit date: 09/15/1968  . Smokeless tobacco: Never Used     Comment: smoked 1.5 for  20 years  . Alcohol Use: No  . Drug Use: No    . Sexual Activity: Yes   Other Topics Concern  . Not on file   Social History Narrative   Married for 50 years. She has 1 son that age 46   Retired from [employer]          Past Surgical History  Procedure Laterality Date  . Tubal ligation    . Total hip arthroplasty    . Knuckle replacement      Family History  Problem Relation Age of Onset  . Heart disease Mother     Mother also has significant carotid artery stenosis  . COPD Father   . Alcoholism Father   . Cancer Brother   . Alzheimer's disease Sister   . Thyroid disease Sister     Goiter  . Breast cancer Neg Hx     Allergies  Allergen Reactions  . Cephalexin     Current Outpatient Prescriptions on File Prior to Visit  Medication Sig Dispense Refill  . aspirin-acetaminophen-caffeine (EXCEDRIN MIGRAINE) 250-250-65 MG per tablet Take 1 tablet by mouth every 6 (six) hours as needed. 30 tablet 0  . buPROPion (WELLBUTRIN XL) 300 MG 24 hr tablet Take 1 tablet (300 mg total) by mouth daily. 90 tablet 1  . calcium carbonate (TUMS - DOSED IN MG ELEMENTAL CALCIUM) 500 MG chewable tablet Chew 1 tablet by mouth daily.    Marland Kitchen co-enzyme Q-10 30 MG capsule Take 30 mg by mouth 3 (three) times daily.    . cyanocobalamin 500 MCG tablet Take 500 mcg by mouth daily.    Marland Kitchen  FLUoxetine (PROZAC) 10 MG capsule TAKE THREE CAPSULES BY MOUTH EVERY DAY 270 capsule 1  . glucosamine-chondroitin 500-400 MG tablet Take 1 tablet by mouth 3 (three) times daily.    Marland Kitchen ibuprofen (ADVIL,MOTRIN) 800 MG tablet Take 1 tablet (800 mg total) by mouth 3 (three) times daily. 6 tablet 0  . Melatonin 3 MG TABS Take by mouth.    . Multiple Vitamin (MULTIVITAMIN) capsule Take 1 capsule by mouth daily.      . ranitidine (ZANTAC) 150 MG tablet Take 1 tablet (150 mg total) by mouth 2 (two) times daily as needed for heartburn. 180 tablet 1  . valsartan-hydrochlorothiazide (DIOVAN-HCT) 320-25 MG per tablet Take 1 tablet by mouth daily. 90 tablet 1   . VOLTAREN 1 % GEL APPLY   TOPICALLY TO AFFECTED AREA 4 TIMES DAILY 100 g 11   No current facility-administered medications on file prior to visit.    BP 140/98 mmHg  Pulse 84  Temp(Src) 98.1 F (36.7 C) (Oral)  Wt 180 lb (81.647 kg)    Objective:   Physical Exam  Constitutional: She is oriented to person, place, and time. She appears well-developed and well-nourished. No distress.  Cardiovascular: Normal rate, regular rhythm and normal heart sounds.   No murmur heard. Pulmonary/Chest: Effort normal and breath sounds normal. She has no wheezes.  Musculoskeletal: She exhibits no edema.  Neurological: She is alert and oriented to person, place, and time.  Skin:  Left middle finger laceration well healed.             Assessment & Plan:

## 2014-12-15 NOTE — Patient Instructions (Signed)
Monitor your blood pressure at home as directed.  Keep a log of your reading to bring with you to your next office visit. Taper adderall use directed.

## 2014-12-15 NOTE — Progress Notes (Signed)
Pre visit review using our clinic review tool, if applicable. No additional management support is needed unless otherwise documented below in the visit note. 

## 2014-12-15 NOTE — Assessment & Plan Note (Signed)
Patient has noticed elevated diastolic blood pressure readings at home. Continue to monitor and maintain outpatient blood pressure log.  Consider decreasing valsartan dose and adding amlodipine.  BP: (!) 140/98 mmHg

## 2014-12-15 NOTE — Assessment & Plan Note (Signed)
Patient evaluated by behavioral health. Her symptoms felt to be secondary to anxiety/depression. We discussed slowly tapering Adderall dose over the next 3-6 months.

## 2014-12-21 ENCOUNTER — Emergency Department (HOSPITAL_COMMUNITY)
Admission: EM | Admit: 2014-12-21 | Discharge: 2014-12-21 | Disposition: A | Discharge status: Home / Self Care | Payer: Medicare Other | Attending: Emergency Medicine | Admitting: Emergency Medicine

## 2014-12-21 ENCOUNTER — Encounter (HOSPITAL_COMMUNITY): Payer: Self-pay | Admitting: Emergency Medicine

## 2014-12-21 DIAGNOSIS — Z7982 Long term (current) use of aspirin: Secondary | ICD-10-CM | POA: Diagnosis not present

## 2014-12-21 DIAGNOSIS — Z79899 Other long term (current) drug therapy: Secondary | ICD-10-CM | POA: Insufficient documentation

## 2014-12-21 DIAGNOSIS — Z87891 Personal history of nicotine dependence: Secondary | ICD-10-CM | POA: Diagnosis not present

## 2014-12-21 DIAGNOSIS — K219 Gastro-esophageal reflux disease without esophagitis: Secondary | ICD-10-CM | POA: Diagnosis not present

## 2014-12-21 DIAGNOSIS — M1990 Unspecified osteoarthritis, unspecified site: Secondary | ICD-10-CM | POA: Diagnosis not present

## 2014-12-21 DIAGNOSIS — J45901 Unspecified asthma with (acute) exacerbation: Secondary | ICD-10-CM | POA: Diagnosis not present

## 2014-12-21 DIAGNOSIS — Z8781 Personal history of (healed) traumatic fracture: Secondary | ICD-10-CM | POA: Insufficient documentation

## 2014-12-21 DIAGNOSIS — N390 Urinary tract infection, site not specified: Secondary | ICD-10-CM | POA: Diagnosis not present

## 2014-12-21 DIAGNOSIS — Z8669 Personal history of other diseases of the nervous system and sense organs: Secondary | ICD-10-CM | POA: Insufficient documentation

## 2014-12-21 DIAGNOSIS — R319 Hematuria, unspecified: Secondary | ICD-10-CM

## 2014-12-21 DIAGNOSIS — I10 Essential (primary) hypertension: Secondary | ICD-10-CM | POA: Insufficient documentation

## 2014-12-21 LAB — BASIC METABOLIC PANEL
ANION GAP: 9 (ref 5–15)
BUN: 21 mg/dL (ref 6–23)
CALCIUM: 10.3 mg/dL (ref 8.4–10.5)
CHLORIDE: 99 mmol/L (ref 96–112)
CO2: 28 mmol/L (ref 19–32)
Creatinine, Ser: 0.81 mg/dL (ref 0.50–1.10)
GFR calc Af Amer: 81 mL/min — ABNORMAL LOW (ref >90)
GFR calc non Af Amer: 70 mL/min — ABNORMAL LOW (ref >90)
GLUCOSE: 110 mg/dL — AB (ref 70–99)
Potassium: 4.2 mmol/L (ref 3.5–5.1)
Sodium: 136 mmol/L (ref 135–145)

## 2014-12-21 LAB — URINE MICROSCOPIC-ADD ON

## 2014-12-21 LAB — CBC WITH DIFFERENTIAL/PLATELET
Basophils Absolute: 0 10*3/uL (ref 0.0–0.1)
Basophils Relative: 0 % (ref 0–1)
EOS PCT: 1 % (ref 0–5)
Eosinophils Absolute: 0.1 10*3/uL (ref 0.0–0.7)
HEMATOCRIT: 46.8 % — AB (ref 36.0–46.0)
HEMOGLOBIN: 15.5 g/dL — AB (ref 12.0–15.0)
LYMPHS PCT: 16 % (ref 12–46)
Lymphs Abs: 2.1 10*3/uL (ref 0.7–4.0)
MCH: 29.4 pg (ref 26.0–34.0)
MCHC: 33.1 g/dL (ref 30.0–36.0)
MCV: 88.8 fL (ref 78.0–100.0)
MONO ABS: 0.9 10*3/uL (ref 0.1–1.0)
MONOS PCT: 7 % (ref 3–12)
NEUTROS ABS: 10.2 10*3/uL — AB (ref 1.7–7.7)
Neutrophils Relative %: 76 % (ref 43–77)
Platelets: 370 10*3/uL (ref 150–400)
RBC: 5.27 MIL/uL — ABNORMAL HIGH (ref 3.87–5.11)
RDW: 13.3 % (ref 11.5–15.5)
WBC: 13.3 10*3/uL — ABNORMAL HIGH (ref 4.0–10.5)

## 2014-12-21 LAB — URINALYSIS, ROUTINE W REFLEX MICROSCOPIC
Bilirubin Urine: NEGATIVE
Glucose, UA: NEGATIVE mg/dL
NITRITE: POSITIVE — AB
PROTEIN: NEGATIVE mg/dL
Specific Gravity, Urine: >1.03 — ABNORMAL HIGH (ref 1.005–1.030)
Urobilinogen, UA: 0.2 mg/dL (ref 0.0–1.0)
pH: 6 (ref 5.0–8.0)

## 2014-12-21 MED ORDER — CIPROFLOXACIN HCL 250 MG PO TABS
500.0000 mg | ORAL_TABLET | Freq: Once | ORAL | Status: AC
  Administered 2014-12-21: 500 mg via ORAL
  Filled 2014-12-21: qty 2

## 2014-12-21 MED ORDER — ACETAMINOPHEN 500 MG PO TABS
1000.0000 mg | ORAL_TABLET | Freq: Once | ORAL | Status: AC
  Administered 2014-12-21: 1000 mg via ORAL
  Filled 2014-12-21: qty 2

## 2014-12-21 MED ORDER — CIPROFLOXACIN HCL 500 MG PO TABS: 500.0000 mg | ORAL_TABLET | Freq: Two times a day (BID) | ORAL | Status: DC

## 2014-12-21 NOTE — Discharge Instructions (Signed)
Hypertension °Hypertension, commonly called high blood pressure, is when the force of blood pumping through your arteries is too strong. Your arteries are the blood vessels that carry blood from your heart throughout your body. A blood pressure reading consists of a higher number over a lower number, such as 110/72. The higher number (systolic) is the pressure inside your arteries when your heart pumps. The lower number (diastolic) is the pressure inside your arteries when your heart relaxes. Ideally you want your blood pressure below 120/80. °Hypertension forces your heart to work harder to pump blood. Your arteries may become narrow or stiff. Having hypertension puts you at risk for heart disease, stroke, and other problems.  °RISK FACTORS °Some risk factors for high blood pressure are controllable. Others are not.  °Risk factors you cannot control include:  °· Race. You may be at higher risk if you are African American. °· Age. Risk increases with age. °· Gender. Men are at higher risk than women before age 45 years. After age 65, women are at higher risk than men. °Risk factors you can control include: °· Not getting enough exercise or physical activity. °· Being overweight. °· Getting too much fat, sugar, calories, or salt in your diet. °· Drinking too much alcohol. °SIGNS AND SYMPTOMS °Hypertension does not usually cause signs or symptoms. Extremely high blood pressure (hypertensive crisis) may cause headache, anxiety, shortness of breath, and nosebleed. °DIAGNOSIS  °To check if you have hypertension, your health care provider will measure your blood pressure while you are seated, with your arm held at the level of your heart. It should be measured at least twice using the same arm. Certain conditions can cause a difference in blood pressure between your right and left arms. A blood pressure reading that is higher than normal on one occasion does not mean that you need treatment. If one blood pressure reading  is high, ask your health care provider about having it checked again. °TREATMENT  °Treating high blood pressure includes making lifestyle changes and possibly taking medicine. Living a healthy lifestyle can help lower high blood pressure. You may need to change some of your habits. °Lifestyle changes may include: °· Following the DASH diet. This diet is high in fruits, vegetables, and whole grains. It is low in salt, red meat, and added sugars. °· Getting at least 2½ hours of brisk physical activity every week. °· Losing weight if necessary. °· Not smoking. °· Limiting alcoholic beverages. °· Learning ways to reduce stress. ° If lifestyle changes are not enough to get your blood pressure under control, your health care provider may prescribe medicine. You may need to take more than one. Work closely with your health care provider to understand the risks and benefits. °HOME CARE INSTRUCTIONS °· Have your blood pressure rechecked as directed by your health care provider.   °· Take medicines only as directed by your health care provider. Follow the directions carefully. Blood pressure medicines must be taken as prescribed. The medicine does not work as well when you skip doses. Skipping doses also puts you at risk for problems.   °· Do not smoke.   °· Monitor your blood pressure at home as directed by your health care provider.  °SEEK MEDICAL CARE IF:  °· You think you are having a reaction to medicines taken. °· You have recurrent headaches or feel dizzy. °· You have swelling in your ankles. °· You have trouble with your vision. °SEEK IMMEDIATE MEDICAL CARE IF: °· You develop a severe headache or confusion. °·   You have unusual weakness, numbness, or feel faint.  You have severe chest or abdominal pain.  You vomit repeatedly.  You have trouble breathing. MAKE SURE YOU:   Understand these instructions.  Will watch your condition.  Will get help right away if you are not doing well or get worse. Document  Released: 09/01/2005 Document Revised: 01/16/2014 Document Reviewed: 06/24/2013 [facility] Patient Information 2015 [place], [place]. This information is not intended to replace advice given to you by your health care provider. Make sure you discuss any questions you have with your health care provider.  How to Take Your Blood Pressure HOW DO I GET A BLOOD PRESSURE MACHINE?  You can buy an electronic home blood pressure machine at your local pharmacy. Insurance will sometimes cover the cost if you have a prescription.  Ask your doctor what type of machine is best for you. There are different machines for your arm and your wrist.  If you decide to buy a machine to check your blood pressure on your arm, first check the size of your arm so you can buy the right size cuff. To check the size of your arm:   Use a measuring tape that shows both inches and centimeters.   Wrap the measuring tape around the upper-middle part of your arm. You may need someone to help you measure.   Write down your arm measurement in both inches and centimeters.   To measure your blood pressure correctly, it is important to have the right size cuff.   If your arm is up to 13 inches (up to 34 centimeters), get an adult cuff size.  If your arm is 13 to 17 inches (35 to 44 centimeters), get a large adult cuff size.    If your arm is 17 to 20 inches (45 to 52 centimeters), get an adult thigh cuff.  WHAT DO THE NUMBERS MEAN?   There are two numbers that make up your blood pressure. For example: 120/80.  The first number (120 in our example) is called the "systolic pressure." It is a measure of the pressure in your blood vessels when your heart is pumping blood.  The second number (80 in our example) is called the "diastolic pressure." It is a measure of the pressure in your blood vessels when your heart is resting between beats.  Your doctor will tell you what your blood pressure should be. WHAT SHOULD I DO  BEFORE I CHECK MY BLOOD PRESSURE?   Try to rest or relax for at least 30 minutes before you check your blood pressure.  Do not smoke.  Do not have any drinks with caffeine, such as:  Soda.  Coffee.  Tea.  Check your blood pressure in a quiet room.  Sit down and stretch out your arm on a table. Keep your arm at about the level of your heart. Let your arm relax.  Make sure that your legs are not crossed. HOW DO I CHECK MY BLOOD PRESSURE?  Follow the directions that came with your machine.  Make sure you remove any tight-fighting clothing from your arm or wrist. Wrap the cuff around your upper arm or wrist. You should be able to fit a finger between the cuff and your arm. If you cannot fit a finger between the cuff and your arm, it is too tight and should be removed and rewrapped.  Some units require you to manually pump up the arm cuff.  Automatic units inflate the cuff when you press a  button.  Cuff deflation is automatic in both models.  After the cuff is inflated, the unit measures your blood pressure and pulse. The readings are shown on a monitor. Hold still and breathe normally while the cuff is inflated.  Getting a reading takes less than a minute.  Some models store readings in a memory. Some provide a printout of readings. If your machine does not store your readings, keep a written record.  Take readings with you to your next visit with your doctor. Document Released: 08/14/2008 Document Revised: 01/16/2014 Document Reviewed: 10/27/2013 [facility] Patient Information 2015 [place], [place]. This information is not intended to replace advice given to you by your health care provider. Make sure you discuss any questions you have with your health care provider.  Urinary Tract Infection Urinary tract infections (UTIs) can develop anywhere along your urinary tract. Your urinary tract is your body's drainage system for removing wastes and extra water. Your urinary tract  includes two kidneys, two ureters, a bladder, and a urethra. Your kidneys are a pair of bean-shaped organs. Each kidney is about the size of your fist. They are located below your ribs, one on each side of your spine. CAUSES Infections are caused by microbes, which are microscopic organisms, including fungi, viruses, and bacteria. These organisms are so small that they can only be seen through a microscope. Bacteria are the microbes that most commonly cause UTIs. SYMPTOMS  Symptoms of UTIs may vary by age and gender of the patient and by the location of the infection. Symptoms in young women typically include a frequent and intense urge to urinate and a painful, burning feeling in the bladder or urethra during urination. Older women and men are more likely to be tired, shaky, and weak and have muscle aches and abdominal pain. A fever may mean the infection is in your kidneys. Other symptoms of a kidney infection include pain in your back or sides below the ribs, nausea, and vomiting. DIAGNOSIS To diagnose a UTI, your caregiver will ask you about your symptoms. Your caregiver also will ask to provide a urine sample. The urine sample will be tested for bacteria and white blood cells. White blood cells are made by your body to help fight infection. TREATMENT  Typically, UTIs can be treated with medication. Because most UTIs are caused by a bacterial infection, they usually can be treated with the use of antibiotics. The choice of antibiotic and length of treatment depend on your symptoms and the type of bacteria causing your infection. HOME CARE INSTRUCTIONS  If you were prescribed antibiotics, take them exactly as your caregiver instructs you. Finish the medication even if you feel better after you have only taken some of the medication.  Drink enough water and fluids to keep your urine clear or pale yellow.  Avoid caffeine, tea, and carbonated beverages. They tend to irritate your bladder.  Empty  your bladder often. Avoid holding urine for long periods of time.  Empty your bladder before and after sexual intercourse.  After a bowel movement, women should cleanse from front to back. Use each tissue only once. SEEK MEDICAL CARE IF:   You have back pain.  You develop a fever.  Your symptoms do not begin to resolve within 3 days. SEEK IMMEDIATE MEDICAL CARE IF:   You have severe back pain or lower abdominal pain.  You develop chills.  You have nausea or vomiting.  You have continued burning or discomfort with urination. MAKE SURE YOU:   Understand  these instructions.  Will watch your condition.  Will get help right away if you are not doing well or get worse. Document Released: 06/11/2005 Document Revised: 03/02/2012 Document Reviewed: 10/10/2011 [facility] Patient Information 2015 [place], [place]. This information is not intended to replace advice given to you by your health care provider. Make sure you discuss any questions you have with your health care provider.

## 2014-12-21 NOTE — ED Notes (Signed)
Took BP at home, 150/105.  Lowest DBP today at home has been 95.  Current BP 162/86.  Woke up this am with headache about 5 am.  Took one BP pill and 2 bayer Asprin, with no relief.  Vomited once at lunch.  Denies blurred vision or speech problems.

## 2014-12-21 NOTE — ED Provider Notes (Signed)
TIME SEEN: 9:37 PM   CHIEF COMPLAINT: Hypertension  HPI: HPI Comments: [name] is a 74 y.o. female with history of hypertension on verapamil, hyperlipidemia, frequent UTIs who presents to the Emergency Department complaining of elevated blood pressure earlier today. Pt states that today she took her BP and got a reading of 150/105.  She reports she was told to check her blood pressure by her primary care physician as it is normally in the 120s/70s but over the past several weeks as needed in the 140s/80s. She states today she had a diffuse frontal throbbing headache and one episode of vomiting. She still having some mild nausea. Denies history of similar headaches. Denies sudden onset, worsening of her life. States her headache is almost completely gone without intervention. No head injury. Not on anticoagulation. No fevers, neck pain or neck stiffness. No numbness, tingling or focal weakness. Denies chest pain, shortness of breath, blurry vision.   Pt states that she also feels as if she has a UTI. Pt states she has associatefelt odor with urination.  Also reports urgency. Pt states that she has a Hx of UTIs 's and this feels like similar UTI's. Pt denies dysuria, hematuria, urinary frequency.   ROS: See HPI Constitutional: no fever  Eyes: no drainage  ENT: no runny nose   Cardiovascular:  no chest pain  Resp: no SOB  GI: vomiting x1 GU: no dysuria Integumentary: no rash  Allergy: no hives  Musculoskeletal: no leg swelling  Neurological: no slurred speech ROS otherwise negative  PAST MEDICAL HISTORY/PAST SURGICAL HISTORY:  Past Medical History  Diagnosis Date  . HYPERLIPIDEMIA NEC/NOS 04/19/2007  . HYPERTENSION 04/07/2007  . ALLERGIC RHINITIS 11/30/2008  . ASTHMA UNSPECIFIED WITH EXACERBATION 02/17/2008  . Esophageal reflux 11/08/2008  . OSTEOARTHRITIS 04/07/2007  . OSTEOPENIA 03/10/2008  . INSOMNIA 08/07/2010  . CAROTID BRUIT, LEFT 04/19/2007  . CLOSED FRACTURE OF METATARSAL BONE  06/04/2009  . ALLERGIC RHINITIS 11/30/2008  . Closed fracture of lateral malleolus 06/04/2009  . GANGLION CYST 04/19/2007  . UTI 07/20/2009    MEDICATIONS:  Prior to Admission medications   Medication Sig Start Date End Date Taking? Authorizing Provider  amphetamine-dextroamphetamine (ADDERALL) 20 MG tablet Take 1 tablet (20 mg total) by mouth 2 (two) times daily. 12/15/14   Doe-Hyun R Shawna Orleans, DO  aspirin-acetaminophen-caffeine (EXCEDRIN MIGRAINE) [phone] MG per tablet Take 1 tablet by mouth every 6 (six) hours as needed. 09/02/13   Ricard Dillon, MD  buPROPion (WELLBUTRIN XL) 300 MG 24 hr tablet Take 1 tablet (300 mg total) by mouth daily. 10/25/14   Doe-Hyun R Shawna Orleans, DO  calcium carbonate (TUMS - DOSED IN MG ELEMENTAL CALCIUM) 500 MG chewable tablet Chew 1 tablet by mouth daily.    Historical Provider, MD  co-enzyme Q-10 30 MG capsule Take 30 mg by mouth 3 (three) times daily.    Historical Provider, MD  cyanocobalamin 500 MCG tablet Take 500 mcg by mouth daily.    Historical Provider, MD  FLUoxetine (PROZAC) 10 MG capsule TAKE THREE CAPSULES BY MOUTH EVERY DAY 10/25/14   Doe-Hyun Kyra Searles, DO  glucosamine-chondroitin 500-400 MG tablet Take 1 tablet by mouth 3 (three) times daily.    Historical Provider, MD  ibuprofen (ADVIL,MOTRIN) 800 MG tablet Take 1 tablet (800 mg total) by mouth 3 (three) times daily. 12/04/14   Tammi Triplett, PA-C  Melatonin 3 MG TABS Take by mouth.    Historical Provider, MD  Multiple Vitamin (MULTIVITAMIN) capsule Take 1 capsule by mouth daily.  Historical Provider, MD  ranitidine (ZANTAC) 150 MG tablet Take 1 tablet (150 mg total) by mouth 2 (two) times daily as needed for heartburn. 10/25/14   Doe-Hyun R Shawna Orleans, DO  valsartan-hydrochlorothiazide (DIOVAN-HCT) 320-25 MG per tablet Take 1 tablet by mouth daily. 10/25/14   Doe-Hyun R Shawna Orleans, DO  VOLTAREN 1 % GEL APPLY   TOPICALLY TO AFFECTED AREA 4 TIMES DAILY 06/08/13   Ricard Dillon, MD    ALLERGIES:  Allergies  Allergen  Reactions  . Cephalexin     SOCIAL HISTORY:  History  Substance Use Topics  . Smoking status: Former Smoker -- 1.50 packs/day for 20 years    Types: Cigarettes    Quit date: 09/15/1968  . Smokeless tobacco: Never Used     Comment: smoked 1.5 for  20 years  . Alcohol Use: No    FAMILY HISTORY: Family History  Problem Relation Age of Onset  . Heart disease Mother     Mother also has significant carotid artery stenosis  . COPD Father   . Alcoholism Father   . Cancer Brother   . Alzheimer's disease Sister   . Thyroid disease Sister     Goiter  . Breast cancer Neg Hx     EXAM: BP 162/86 mmHg  Pulse 84  Temp(Src) 97.4 F (36.3 C) (Oral)  Resp 18  Ht 5\' 4"  (1.626 m)  Wt 180 lb (81.647 kg)  BMI 30.88 kg/m2  SpO2 98%   CONSTITUTIONAL: Alert and oriented and responds appropriately to questions. Well-appearing; well-nourished HEAD: Normocephalic EYES: Conjunctivae clear, PERRL ENT: normal nose; no rhinorrhea; moist mucous membranes; pharynx without lesions noted NECK: Supple, no meningismus, no LAD  CARD: RRR; S1 and S2 appreciated; no murmurs, no clicks, no rubs, no gallops RESP: Normal chest excursion without splinting or tachypnea; breath sounds clear and equal bilaterally; no wheezes, no rhonchi, no rales,  ABD/GI: Normal bowel sounds; non-distended; soft, non-tender, no rebound, no guarding BACK:  The back appears normal and is non-tender to palpation, there is no CVA tenderness EXT: Normal ROM in all joints; non-tender to palpation; no edema; normal capillary refill; no cyanosis    SKIN: Normal color for age and race; warm NEURO: Moves all extremities equally stregnth 5/5 in all extremities, no dysmetria finger to nose bilaterally, sensation to light touch intact, cranial nerves 2-12 intact.  PSYCH: The patient's mood and manner are appropriate. Grooming and personal hygiene are appropriate.  MEDICAL DECISION MAKING: Patient here with episode of hypertension that  resolved without intervention. Blood pressure is now normal. Headache is almost completely gone. Neurologically intact. Labs unremarkable other than mild leukocytosis with left shift. She does have a nitrite-positive UTI. Culture pending. Culture of the past has grown Escherichia coli that is pansensitive to antibiotics. She reports relief with Cipro before. We'll discharge with Cipro again tonight. Have advised her to continue checking her blood pressure at home and keep a log of this. Discussed with patient that it is difficult to tell if it was her headache causing her hypertension or her hypertension causing her headache given both have improved.  Have recommended close outpatient follow-up. Discussed return precautions. She verbalized understanding and is comfortable with plan.        EKG Interpretation  Date/Time:  Thursday December 21 2014 22:05:11 EDT Ventricular Rate:  78 PR Interval:  132 QRS Duration: 100 QT Interval:  401 QTC Calculation: 457 R Axis:   49 Text Interpretation:  Sinus rhythm No significant change since last tracing Confirmed  by Kenna Gilbert, KRISTEN [phone]) on 12/21/2014 10:26:58 PM        I personally performed the services described in this documentation, which was scribed in my presence. The recorded information has been reviewed and is accurate.    [place], DO 12/21/14 [id number]

## 2014-12-22 ENCOUNTER — Telehealth: Payer: Self-pay | Admitting: Endocrinology

## 2014-12-22 ENCOUNTER — Other Ambulatory Visit: Payer: Self-pay

## 2014-12-22 ENCOUNTER — Other Ambulatory Visit (INDEPENDENT_AMBULATORY_CARE_PROVIDER_SITE_OTHER): Payer: Medicare Other

## 2014-12-22 DIAGNOSIS — E0520 Thyrotoxicosis with toxic multinodular goiter without thyrotoxic crisis or storm: Secondary | ICD-10-CM | POA: Diagnosis not present

## 2014-12-22 LAB — T4, FREE: Free T4: 0.75 ng/dL (ref 0.60–1.60)

## 2014-12-22 LAB — TSH: TSH: 15.86 u[IU]/mL — AB (ref 0.35–4.50)

## 2014-12-22 LAB — T3, FREE: T3 FREE: 2.7 pg/mL (ref 2.3–4.2)

## 2014-12-22 NOTE — Telephone Encounter (Signed)
FYI: Pt would like to go to [facility] from now on for her labs if you could please make all future labs orders for her to have them done there

## 2014-12-25 LAB — URINE CULTURE: Colony Count: >=100000

## 2014-12-26 ENCOUNTER — Telehealth (HOSPITAL_BASED_OUTPATIENT_CLINIC_OR_DEPARTMENT_OTHER): Payer: Self-pay | Admitting: Emergency Medicine

## 2014-12-26 NOTE — Telephone Encounter (Signed)
Post ED Visit - Positive Culture Follow-up  Culture report reviewed by antimicrobial stewardship pharmacist: []  Wes Dulaney, Pharm.D., BCPS [x]  Heide Guile, Pharm.D., BCPS []  Alycia Rossetti, Pharm.D., BCPS []  [place], Pharm.D., BCPS, AAHIVP []  Legrand Como, Pharm.D., BCPS, AAHIVP []  Isac Sarna, Pharm.D., BCPS  Positive urine culture E. Coli Treated with ciprofloxacin , sensitive,and no further patient follow-up is required at this time.  Hazle Nordmann 12/26/2014, 10:24 AM

## 2014-12-27 ENCOUNTER — Ambulatory Visit (INDEPENDENT_AMBULATORY_CARE_PROVIDER_SITE_OTHER): Payer: Medicare Other | Admitting: Endocrinology

## 2014-12-27 ENCOUNTER — Encounter: Payer: Self-pay | Admitting: Endocrinology

## 2014-12-27 VITALS — BP 132/82 | HR 91 | Temp 98.3°F | Ht 64.0 in | Wt 186.4 lb

## 2014-12-27 DIAGNOSIS — E890 Postprocedural hypothyroidism: Secondary | ICD-10-CM

## 2014-12-27 MED ORDER — LEVOTHYROXINE SODIUM 50 MCG PO TABS: 50.0000 ug | ORAL_TABLET | Freq: Every day | ORAL | Status: DC

## 2014-12-27 NOTE — Patient Instructions (Addendum)
Synthroid before Bfst and no iron or calcium with it  Call if having worsening fatigue

## 2014-12-27 NOTE — Progress Notes (Signed)
Patient ID: [name], female   DOB: 28-Jul-1941, 74 y.o.   MRN: [id number]    Reason for Appointment:  Follow-up of thyroid    History of Present Illness:   Previous history: She apparently has had a low TSH level as far back as 7 years ago  She gives a history of tending to get hot and sweaty with physical activity but this has been going on for the last 5 years. She does not have any heat intolerance or sweating episodes otherwise including at night She may feel palpitations occasionally but this is mostly after her morning coffee and her Adderall medication Does not think she has any shakiness of her hands but inside me at times feel Trembly She has had long history of anxiety and depression  She does complain of feeling fatigue which is not new  No history of significant weight loss  Because of her symptoms and TSH in the hyperthyroid range as well as uptake of 34% she was given I-131 treatment with 30 mCi on 09/27/14.  Subsequently she has not had any further symptoms of hot flashes or heat intolerance Also has not had any palpitations Overall feels fairly good except when she has any intercurrent health issues Does not complain of cold intolerance  Her weight has increased since her last visit Also her TSH which was significantly suppressed is now increased along with free T4 in the low normal range   Wt Readings from Last 3 Encounters:  12/27/14 186 lb 6 oz (84.539 kg)  12/21/14 180 lb (81.647 kg)  12/15/14 180 lb (81.647 kg)    Lab Results  Component Value Date   TSH 15.86* 12/22/2014   TSH 0.06* 07/05/2014   TSH 0.04* 12/26/2013   FREET4 0.75 12/22/2014   FREET4 0.95 11/04/2014   FREET4 1.14 07/10/2014    She was also sent for a thyroid scan and uptake which showed the I-131 uptake to be 34%, above normal and the scan did not show any hot or cold areas but no detailed description is available       Medication List       This list is accurate as of:  12/27/14  3:29 PM.  Always use your most recent med list.               amphetamine-dextroamphetamine 20 MG tablet  Commonly known as:  ADDERALL  Take 1 tablet (20 mg total) by mouth 2 (two) times daily.     aspirin EC 81 MG tablet  Take 162 mg by mouth once as needed for mild pain or moderate pain.     aspirin-acetaminophen-caffeine 474-259-56 MG per tablet  Commonly known as:  EXCEDRIN MIGRAINE  Take 1 tablet by mouth every 6 (six) hours as needed.     B-12 PO  Take 1 tablet by mouth daily.     buPROPion 300 MG 24 hr tablet  Commonly known as:  WELLBUTRIN XL  Take 1 tablet (300 mg total) by mouth daily.     calcium carbonate 500 MG chewable tablet  Commonly known as:  TUMS - dosed in mg elemental calcium  Chew 1-2 tablets by mouth 2 (two) times daily.     ciprofloxacin 500 MG tablet  Commonly known as:  CIPRO  Take 1 tablet (500 mg total) by mouth 2 (two) times daily.     FLUoxetine 10 MG capsule  Commonly known as:  PROZAC  TAKE THREE CAPSULES BY MOUTH EVERY DAY  glucosamine-chondroitin 500-400 MG tablet  Take 1 tablet by mouth 3 (three) times daily.     ibuprofen 800 MG tablet  Commonly known as:  ADVIL,MOTRIN  Take 1 tablet (800 mg total) by mouth 3 (three) times daily.     Melatonin 3 MG Tabs  Take 3 mg by mouth at bedtime.     multivitamin capsule  Take 1 capsule by mouth daily.     ranitidine 150 MG tablet  Commonly known as:  ZANTAC  Take 1 tablet (150 mg total) by mouth 2 (two) times daily as needed for heartburn.     valsartan-hydrochlorothiazide 320-25 MG per tablet  Commonly known as:  DIOVAN-HCT  Take 1 tablet by mouth daily.     VOLTAREN 1 % Gel  Generic drug:  diclofenac sodium  APPLY   TOPICALLY TO AFFECTED AREA 4 TIMES DAILY        Allergies:  Allergies  Allergen Reactions  . Cephalexin Other (See Comments)    REACTION IS UNKNOWN    Past Medical History  Diagnosis Date  . HYPERLIPIDEMIA NEC/NOS 04/19/2007  . HYPERTENSION  04/07/2007  . ALLERGIC RHINITIS 11/30/2008  . ASTHMA UNSPECIFIED WITH EXACERBATION 02/17/2008  . Esophageal reflux 11/08/2008  . OSTEOARTHRITIS 04/07/2007  . OSTEOPENIA 03/10/2008  . INSOMNIA 08/07/2010  . CAROTID BRUIT, LEFT 04/19/2007  . CLOSED FRACTURE OF METATARSAL BONE 06/04/2009  . ALLERGIC RHINITIS 11/30/2008  . Closed fracture of lateral malleolus 06/04/2009  . GANGLION CYST 04/19/2007  . UTI 07/20/2009    Past Surgical History  Procedure Laterality Date  . Tubal ligation    . Total hip arthroplasty    . Knuckle replacement      Family History  Problem Relation Age of Onset  . Heart disease Mother     Mother also has significant carotid artery stenosis  . COPD Father   . Alcoholism Father   . Cancer Brother   . Alzheimer's disease Sister   . Thyroid disease Sister     Goiter  . Breast cancer Neg Hx     Social History:  reports that she quit smoking about 46 years ago. Her smoking use included Cigarettes. She has a 30 pack-year smoking history. She has never used smokeless tobacco. She reports that she does not drink alcohol or use illicit drugs.   Review of Systems:  There is a history of high blood pressure treated by PCP .     No swelling of her feet recently       Examination:   BP 132/82 mmHg  Wt 186 lb 6 oz (84.539 kg)  Repeat pulse 76 regular         The thyroid is  not palpable on either side  Neurological: REFLEXES: at biceps are normal.    Assessment/Plan:  She had subclinical hyperthyroidism secondary to toxic nodular goiter and this has been treated with I-131 in 1/16 Her goiter has reduced in size significantly and is not palpable now Subjectively she is feeling better also  Surprisingly her TSH is now increased significantly and she may be getting hypothyroid Not clear to what extent she is hypothyroid or what the natural course would be  Will need to have her start supplementation with levothyroxine and since she is not symptomatic will start  with only 50 g Will need to reassess her levels in 6 weeks and adjust dose as needed Discussed that if she has unusual fatigue will need to  increase her dose much more and needs regular follow-up  [facility] 12/27/2014

## 2014-12-27 NOTE — Progress Notes (Signed)
Pre visit review using our clinic review tool, if applicable. No additional management support is needed unless otherwise documented below in the visit note. 

## 2015-01-18 ENCOUNTER — Encounter: Payer: Self-pay | Admitting: Endocrinology

## 2015-01-19 ENCOUNTER — Other Ambulatory Visit: Payer: Self-pay | Admitting: *Deleted

## 2015-01-19 MED ORDER — LEVOTHYROXINE SODIUM 50 MCG PO TABS: ORAL_TABLET | ORAL | Status: DC

## 2015-02-01 ENCOUNTER — Encounter: Payer: Self-pay | Admitting: Endocrinology

## 2015-02-02 ENCOUNTER — Other Ambulatory Visit: Payer: Self-pay | Admitting: Endocrinology

## 2015-02-02 DIAGNOSIS — E890 Postprocedural hypothyroidism: Secondary | ICD-10-CM | POA: Diagnosis not present

## 2015-02-03 LAB — T4: T4 TOTAL: 8.6 ug/dL (ref 4.5–12.0)

## 2015-02-03 LAB — TSH: TSH: 3.362 u[IU]/mL (ref 0.350–4.500)

## 2015-02-03 LAB — T3: T3 TOTAL: 103.4 ng/dL (ref 80.0–204.0)

## 2015-02-03 LAB — T4, FREE: FREE T4: 1.14 ng/dL (ref 0.80–1.80)

## 2015-02-07 ENCOUNTER — Encounter: Payer: Self-pay | Admitting: Endocrinology

## 2015-02-07 ENCOUNTER — Ambulatory Visit (INDEPENDENT_AMBULATORY_CARE_PROVIDER_SITE_OTHER): Payer: Medicare Other | Admitting: Endocrinology

## 2015-02-07 VITALS — BP 124/78 | HR 70 | Temp 97.9°F | Resp 14 | Ht 64.0 in | Wt 179.2 lb

## 2015-02-07 DIAGNOSIS — E890 Postprocedural hypothyroidism: Secondary | ICD-10-CM | POA: Diagnosis not present

## 2015-02-07 MED ORDER — LEVOTHYROXINE SODIUM 75 MCG PO TABS: 75.0000 ug | ORAL_TABLET | Freq: Every day | ORAL | Status: DC

## 2015-02-07 NOTE — Progress Notes (Signed)
Patient ID: [name], female   DOB: [birth year], 74 y.o.   MRN: [id number]    Reason for Appointment:  Follow-up of thyroid    History of Present Illness:   Previous history: She had a low TSH level as far back as 7 years ago She had symptoms of feeling hot and sweaty with physical activity She also had palpitations occasionally as well as symptoms of fatigue Does not think she had any shakiness of her hands but inside me at times feel Trembly She has had long history of anxiety and depression   Because of her symptoms and TSH in the hyperthyroid range as well as uptake of 34% she was given I-131 treatment with 30 mCi on 09/27/14.  Subsequently she has not had any further symptoms of hot flashes, palpitations, fatigue or heat intolerance  Recent history: Also her TSH which was significantly suppressed previously it did increase and was as high as 15.9 before starting her supplement of 75 g of levothyroxine in 12/2014. Not she thinks she may be feeling a little better but is concerned about some hair loss Also having new problems with depression   Wt Readings from Last 3 Encounters:  02/07/15 179 lb 3.2 oz (81.285 kg)  12/27/14 186 lb 6 oz (84.539 kg)  12/21/14 180 lb (81.647 kg)    Lab Results  Component Value Date   TSH 3.362 02/02/2015   TSH 15.86* 12/22/2014   TSH 0.06* 07/05/2014   FREET4 1.14 02/02/2015   FREET4 0.75 12/22/2014   FREET4 0.95 11/04/2014    She was also sent for a thyroid scan and uptake which showed the I-131 uptake to be 34%, above normal and the scan did not show any hot or cold areas but no detailed description is available..       Medication List       This list is accurate as of: 02/07/15  2:58 PM.  Always use your most recent med list.               amphetamine-dextroamphetamine 20 MG tablet  Commonly known as:  ADDERALL  Take 1 tablet (20 mg total) by mouth 2 (two) times daily.     aspirin-acetaminophen-caffeine 595-638-75 MG  per tablet  Commonly known as:  EXCEDRIN MIGRAINE  Take 1 tablet by mouth every 6 (six) hours as needed.     B-12 PO  Take 1 tablet by mouth daily.     buPROPion 300 MG 24 hr tablet  Commonly known as:  WELLBUTRIN XL  Take 1 tablet (300 mg total) by mouth daily.     calcium carbonate 500 MG chewable tablet  Commonly known as:  TUMS - dosed in mg elemental calcium  Chew 1-2 tablets by mouth 2 (two) times daily.     ciprofloxacin 500 MG tablet  Commonly known as:  CIPRO  Take 1 tablet (500 mg total) by mouth 2 (two) times daily.     FLUoxetine 10 MG capsule  Commonly known as:  PROZAC  TAKE THREE CAPSULES BY MOUTH EVERY DAY     glucosamine-chondroitin 500-400 MG tablet  Take 1 tablet by mouth 3 (three) times daily.     ibuprofen 800 MG tablet  Commonly known as:  ADVIL,MOTRIN  Take 1 tablet (800 mg total) by mouth 3 (three) times daily.     levothyroxine 50 MCG tablet  Commonly known as:  SYNTHROID, LEVOTHROID  Take 1 1/2 tablets daily     Melatonin 3 MG Tabs  Take 3 mg by mouth at bedtime.     multivitamin capsule  Take 1 capsule by mouth daily.     ranitidine 150 MG tablet  Commonly known as:  ZANTAC  Take 1 tablet (150 mg total) by mouth 2 (two) times daily as needed for heartburn.     valsartan-hydrochlorothiazide 320-25 MG per tablet  Commonly known as:  DIOVAN-HCT  Take 1 tablet by mouth daily.     VOLTAREN 1 % Gel  Generic drug:  diclofenac sodium  APPLY   TOPICALLY TO AFFECTED AREA 4 TIMES DAILY        Allergies:  Allergies  Allergen Reactions  . Cephalexin Other (See Comments)    REACTION IS UNKNOWN    Past Medical History  Diagnosis Date  . HYPERLIPIDEMIA NEC/NOS 04/19/2007  . HYPERTENSION 04/07/2007  . ALLERGIC RHINITIS 11/30/2008  . ASTHMA UNSPECIFIED WITH EXACERBATION 02/17/2008  . Esophageal reflux 11/08/2008  . OSTEOARTHRITIS 04/07/2007  . OSTEOPENIA 03/10/2008  . INSOMNIA 08/07/2010  . CAROTID BRUIT, LEFT 04/19/2007  . CLOSED FRACTURE OF  METATARSAL BONE 06/04/2009  . ALLERGIC RHINITIS 11/30/2008  . Closed fracture of lateral malleolus 06/04/2009  . GANGLION CYST 04/19/2007  . UTI 07/20/2009    Past Surgical History  Procedure Laterality Date  . Tubal ligation    . Total hip arthroplasty    . Knuckle replacement      Family History  Problem Relation Age of Onset  . Heart disease Mother     Mother also has significant carotid artery stenosis  . COPD Father   . Alcoholism Father   . Cancer Brother   . Alzheimer's disease Sister   . Thyroid disease Sister     Goiter  . Breast cancer Neg Hx     Social History:  reports that she quit smoking about 46 years ago. Her smoking use included Cigarettes. She has a 30 pack-year smoking history. She has never used smokeless tobacco. She reports that she does not drink alcohol or use illicit drugs.   Review of Systems:  There is a history of high blood pressure treated by PCP .      Review of Systems  Psychiatric/Behavioral: Positive for depression.      Examination:   BP 124/78 mmHg  Pulse 70  Temp(Src) 97.9 F (36.6 C)  Resp 14  Ht 5\' 4"  (1.626 m)  Wt 179 lb 3.2 oz (81.285 kg)  BMI 30.74 kg/m2  SpO2 96%          The thyroid is not palpable on either side  Neurological: REFLEXES: at biceps are normal.   She is somewhat anxious  Assessment/Plan:  She had subclinical hyperthyroidism secondary to toxic nodular goiter treated with I-131 in 1/16 Her goiter has reduced in size significantly and is not palpable    She has developed persistent post ablative hypothyroidism Since she had a baseline TSH of about 16 she is now taking 75 g levothyroxine   She is feeling somewhat better although she has many other issues tending to cause fatigue. She is still having some problems with hair loss Currently TSH is quite normal with current regimen Subjectively she is feeling better also  She will continue the same regimen I asked to discuss a problem with  depression with her PCP   [facility] 02/07/2015

## 2015-02-07 NOTE — Patient Instructions (Signed)
When current thyroid pill out go to 1 pill of the 75ug daily, can take 1/2 hr before am meal

## 2015-02-19 ENCOUNTER — Encounter: Payer: Self-pay | Admitting: Adult Health

## 2015-02-19 ENCOUNTER — Ambulatory Visit (INDEPENDENT_AMBULATORY_CARE_PROVIDER_SITE_OTHER): Payer: Medicare Other | Admitting: Adult Health

## 2015-02-19 VITALS — BP 124/90 | Temp 97.8°F | Ht 64.0 in | Wt 184.2 lb

## 2015-02-19 DIAGNOSIS — F419 Anxiety disorder, unspecified: Secondary | ICD-10-CM

## 2015-02-19 DIAGNOSIS — M6740 Ganglion, unspecified site: Secondary | ICD-10-CM

## 2015-02-19 DIAGNOSIS — F418 Other specified anxiety disorders: Secondary | ICD-10-CM

## 2015-02-19 DIAGNOSIS — F329 Major depressive disorder, single episode, unspecified: Secondary | ICD-10-CM

## 2015-02-19 DIAGNOSIS — Z09 Encounter for follow-up examination after completed treatment for conditions other than malignant neoplasm: Secondary | ICD-10-CM | POA: Diagnosis not present

## 2015-02-19 DIAGNOSIS — Z760 Encounter for issue of repeat prescription: Secondary | ICD-10-CM | POA: Diagnosis not present

## 2015-02-19 DIAGNOSIS — I10 Essential (primary) hypertension: Secondary | ICD-10-CM | POA: Diagnosis not present

## 2015-02-19 MED ORDER — AMPHETAMINE-DEXTROAMPHETAMINE 15 MG PO TABS: 15.0000 mg | ORAL_TABLET | Freq: Every day | ORAL | Status: DC

## 2015-02-19 MED ORDER — AMPHETAMINE-DEXTROAMPHETAMINE 15 MG PO TABS: 15.0000 mg | ORAL_TABLET | Freq: Two times a day (BID) | ORAL | Status: DC

## 2015-02-19 NOTE — Patient Instructions (Signed)
Continue to taper off your Adderrall.   Follow up with me in one month to establish care.

## 2015-02-19 NOTE — Progress Notes (Signed)
Subjective:    Patient ID: [name], female    DOB: 05/18/1941, 74 y.o.   MRN: [id number]  HPI 1. Patient presents to the office today for four month follow up dealing with her hypertension. She endorses that her blood pressure was elevated but that recently it has come back down to normal limits. Today in the office it is 124/90.   She also has many other issues that she would like discussed during this visit.   2. Has been seen by Behavioral Health to help her get off Adderall. She has tapered down to 20 mg.   3. Depression - Has had "severe depression" for many years. Stopped taking Prozac and Wellbutrin on April 15th and has not had any since. Says she is feeling better since stopping.  4. Would like testosterone creme  5. She would like me to sign a letter for a Pelvic Floor Stimulator.   6. Would like referral to Dermatology for Ganglion Cysts  She was advised that her appointment was for a follow up and that all of these issues could not be addressed at the current time, especially since she was 10 minutes late for a 15 minute appointment. . She seemed upset but was compliant.    Review of Systems  Constitutional: Negative for fever, activity change and fatigue.  HENT: Negative.   Eyes: Negative.   Respiratory: Negative for chest tightness, shortness of breath and wheezing.   Cardiovascular: Negative for chest pain and palpitations.  Neurological: Negative for dizziness, weakness, light-headedness and headaches.  Psychiatric/Behavioral: Negative for suicidal ideas, sleep disturbance, self-injury, dysphoric mood and decreased concentration. The patient is not nervous/anxious.   All other systems reviewed and are negative.  Past Medical History  Diagnosis Date  . HYPERLIPIDEMIA NEC/NOS 04/19/2007  . HYPERTENSION 04/07/2007  . ALLERGIC RHINITIS 11/30/2008  . ASTHMA UNSPECIFIED WITH EXACERBATION 02/17/2008  . Esophageal reflux 11/08/2008  . OSTEOARTHRITIS 04/07/2007  .  OSTEOPENIA 03/10/2008  . INSOMNIA 08/07/2010  . CAROTID BRUIT, LEFT 04/19/2007  . CLOSED FRACTURE OF METATARSAL BONE 06/04/2009  . ALLERGIC RHINITIS 11/30/2008  . Closed fracture of lateral malleolus 06/04/2009  . GANGLION CYST 04/19/2007  . UTI 07/20/2009    History   Social History  . Marital Status: Married    Spouse Name: N/A  . Number of Children: N/A  . Years of Education: N/A   Occupational History  . retired    Social History Main Topics  . Smoking status: Former Smoker -- 1.50 packs/day for 20 years    Types: Cigarettes    Quit date: 09/15/1968  . Smokeless tobacco: Never Used     Comment: smoked 1.5 for  20 years  . Alcohol Use: No  . Drug Use: No  . Sexual Activity: Yes   Other Topics Concern  . Not on file   Social History Narrative   Married for 50 years. She has 1 son that age 87   Retired from Environmental [employer]          Past Surgical History  Procedure Laterality Date  . Tubal ligation    . Total hip arthroplasty    . Knuckle replacement      Family History  Problem Relation Age of Onset  . Heart disease Mother     Mother also has significant carotid artery stenosis  . COPD Father   . Alcoholism Father   . Cancer Brother   . Alzheimer's disease Sister   . Thyroid disease Sister  Goiter  . Breast cancer Neg Hx     Allergies  Allergen Reactions  . Cephalexin Other (See Comments)    REACTION IS UNKNOWN    Current Outpatient Prescriptions on File Prior to Visit  Medication Sig Dispense Refill  . aspirin-acetaminophen-caffeine (EXCEDRIN MIGRAINE) 250-250-65 MG per tablet Take 1 tablet by mouth every 6 (six) hours as needed. (Patient taking differently: Take 2 tablets by mouth daily as needed for headache or migraine. ) 30 tablet 0  . calcium carbonate (TUMS - DOSED IN MG ELEMENTAL CALCIUM) 500 MG chewable tablet Chew 1-2 tablets by mouth 2 (two) times daily.     . Cyanocobalamin (B-12 PO) Take 1 tablet by mouth daily.    Marland Kitchen  glucosamine-chondroitin 500-400 MG tablet Take 1 tablet by mouth 3 (three) times daily.    Marland Kitchen ibuprofen (ADVIL,MOTRIN) 800 MG tablet Take 1 tablet (800 mg total) by mouth 3 (three) times daily. 6 tablet 0  . levothyroxine (SYNTHROID, LEVOTHROID) 75 MCG tablet Take 1 tablet (75 mcg total) by mouth daily. 90 tablet 3  . Melatonin 3 MG TABS Take 3 mg by mouth at bedtime.     . Multiple Vitamin (MULTIVITAMIN) capsule Take 1 capsule by mouth daily.      . ranitidine (ZANTAC) 150 MG tablet Take 1 tablet (150 mg total) by mouth 2 (two) times daily as needed for heartburn. 180 tablet 1  . valsartan-hydrochlorothiazide (DIOVAN-HCT) 320-25 MG per tablet Take 1 tablet by mouth daily. 90 tablet 1  . VOLTAREN 1 % GEL APPLY   TOPICALLY TO AFFECTED AREA 4 TIMES DAILY (Patient taking differently: APPLY   TOPICALLY TO AFFECTED AREA 4 TIMES DAILY AS NEEDED FOR PAIN) 100 g 11   No current facility-administered medications on file prior to visit.    BP 124/90 mmHg  Temp(Src) 97.8 F (36.6 C) (Oral)  Ht 5\' 4"  (1.626 m)  Wt 184 lb 3.2 oz (83.553 kg)  BMI 31.60 kg/m2       Objective:   Physical Exam  Constitutional: She is oriented to person, place, and time. She appears well-developed and well-nourished. No distress.  Cardiovascular: Normal rate, regular rhythm, normal heart sounds and intact distal pulses.  Exam reveals no gallop and no friction rub.   No murmur heard. Pulmonary/Chest: Effort normal and breath sounds normal. No respiratory distress. She has no wheezes. She has no rales. She exhibits no tenderness.  Neurological: She is alert and oriented to person, place, and time.  Skin: Skin is warm and dry. No rash noted. She is not diaphoretic. No erythema. No pallor.  Psychiatric: She has a normal mood and affect. Her behavior is normal. Judgment and thought content normal.  Nursing note and vitals reviewed.      Assessment & Plan:  1. Follow up - amphetamine-dextroamphetamine (ADDERALL) 15 MG  tablet; Take 1 tablet by mouth 2 (two) times daily.  Dispense: 60 tablet; Refill: 0 - Will continue to taper down as Dr. Shawna Orleans was doing.  - Follow up if she would like to establish care.  - Greater than 30 minutes was spent on this visit.   2. Essential hypertension - Continue to monitor.   3. Medication refill - amphetamine-dextroamphetamine (ADDERALL) 15 MG tablet; Take 1 tablet by mouth 2 (two) times daily.  Dispense: 60 tablet; Refill: 0 - Will continue to taper down as Dr. Shawna Orleans was doing.   4. Anxiety and depression - Advised that it was not safe to go cold [place] off these medications -  Will not send in refill unless patient asks for them  - Go to the ER with any thoughts of SI  5. Ganglion cyst - Ambulatory referral to Dermatology

## 2015-02-19 NOTE — Progress Notes (Signed)
Pre visit review using our clinic review tool, if applicable. No additional management support is needed unless otherwise documented below in the visit note. 

## 2015-02-23 ENCOUNTER — Ambulatory Visit: Payer: Medicare Other | Admitting: Internal Medicine

## 2015-02-27 ENCOUNTER — Other Ambulatory Visit: Payer: Self-pay | Admitting: *Deleted

## 2015-02-27 MED ORDER — VALSARTAN-HYDROCHLOROTHIAZIDE 320-25 MG PO TABS: 1.0000 | ORAL_TABLET | Freq: Every day | ORAL | Status: DC

## 2015-03-04 ENCOUNTER — Encounter: Payer: Self-pay | Admitting: Endocrinology

## 2015-03-06 ENCOUNTER — Encounter: Payer: Self-pay | Admitting: Adult Health

## 2015-03-06 NOTE — Telephone Encounter (Signed)
Called and spoke with pt and advised that she would see Dr. Ledell Peoples PA [place] on 7.1.2016. Advised about husbands referral and the process for rating the patient and then scheduling an appointment.

## 2015-03-09 ENCOUNTER — Encounter: Payer: Self-pay | Admitting: Adult Health

## 2015-03-09 ENCOUNTER — Ambulatory Visit (INDEPENDENT_AMBULATORY_CARE_PROVIDER_SITE_OTHER): Payer: Medicare Other | Admitting: Adult Health

## 2015-03-09 VITALS — BP 134/90 | Temp 97.9°F | Ht 64.0 in | Wt 180.9 lb

## 2015-03-09 DIAGNOSIS — E785 Hyperlipidemia, unspecified: Secondary | ICD-10-CM | POA: Diagnosis not present

## 2015-03-09 DIAGNOSIS — Z780 Asymptomatic menopausal state: Secondary | ICD-10-CM

## 2015-03-09 DIAGNOSIS — I10 Essential (primary) hypertension: Secondary | ICD-10-CM

## 2015-03-09 DIAGNOSIS — Z0000 Encounter for general adult medical examination without abnormal findings: Secondary | ICD-10-CM

## 2015-03-09 DIAGNOSIS — R7989 Other specified abnormal findings of blood chemistry: Secondary | ICD-10-CM

## 2015-03-09 DIAGNOSIS — F418 Other specified anxiety disorders: Secondary | ICD-10-CM

## 2015-03-09 LAB — POCT URINALYSIS DIPSTICK
BILIRUBIN UA: NEGATIVE
Glucose, UA: NEGATIVE
Ketones, UA: NEGATIVE
Leukocytes, UA: NEGATIVE
NITRITE UA: NEGATIVE
Protein, UA: NEGATIVE
Spec Grav, UA: 1.01
Urobilinogen, UA: 0.2
pH, UA: 6

## 2015-03-09 LAB — CBC WITH DIFFERENTIAL/PLATELET
BASOS ABS: 0.1 10*3/uL (ref 0.0–0.1)
BASOS PCT: 0.7 % (ref 0.0–3.0)
EOS PCT: 2 % (ref 0.0–5.0)
Eosinophils Absolute: 0.2 10*3/uL (ref 0.0–0.7)
HCT: 43.4 % (ref 36.0–46.0)
Hemoglobin: 14.5 g/dL (ref 12.0–15.0)
Lymphocytes Relative: 38.8 % (ref 12.0–46.0)
Lymphs Abs: 3.5 10*3/uL (ref 0.7–4.0)
MCHC: 33.3 g/dL (ref 30.0–36.0)
MCV: 87.8 fl (ref 78.0–100.0)
Monocytes Absolute: 0.7 10*3/uL (ref 0.1–1.0)
Monocytes Relative: 8 % (ref 3.0–12.0)
NEUTROS ABS: 4.5 10*3/uL (ref 1.4–7.7)
Neutrophils Relative %: 50.5 % (ref 43.0–77.0)
Platelets: 335 10*3/uL (ref 150.0–400.0)
RBC: 4.95 Mil/uL (ref 3.87–5.11)
RDW: 14.2 % (ref 11.5–15.5)
WBC: 9 10*3/uL (ref 4.0–10.5)

## 2015-03-09 LAB — BASIC METABOLIC PANEL
BUN: 22 mg/dL (ref 6–23)
CO2: 33 mEq/L — ABNORMAL HIGH (ref 19–32)
Calcium: 9.9 mg/dL (ref 8.4–10.5)
Chloride: 101 mEq/L (ref 96–112)
Creatinine, Ser: 1.09 mg/dL (ref 0.40–1.20)
GFR: 51.99 mL/min — AB (ref >60.00)
Glucose, Bld: 91 mg/dL (ref 70–99)
POTASSIUM: 3.6 meq/L (ref 3.5–5.1)
SODIUM: 139 meq/L (ref 135–145)

## 2015-03-09 LAB — LIPID PANEL
Cholesterol: 206 mg/dL — ABNORMAL HIGH (ref 0–200)
HDL: 71.1 mg/dL (ref >39.00)
LDL CALC: 122 mg/dL — AB (ref 0–99)
NonHDL: 134.9
TRIGLYCERIDES: 67 mg/dL (ref 0.0–149.0)
Total CHOL/HDL Ratio: 3
VLDL: 13.4 mg/dL (ref 0.0–40.0)

## 2015-03-09 LAB — HEPATIC FUNCTION PANEL
ALT: 25 U/L (ref 0–35)
AST: 30 U/L (ref 0–37)
Albumin: 3.9 g/dL (ref 3.5–5.2)
Alkaline Phosphatase: 84 U/L (ref 39–117)
Bilirubin, Direct: 0 mg/dL (ref 0.0–0.3)
Total Bilirubin: 0.5 mg/dL (ref 0.2–1.2)
Total Protein: 6.8 g/dL (ref 6.0–8.3)

## 2015-03-09 LAB — TSH: TSH: 2.99 u[IU]/mL (ref 0.35–4.50)

## 2015-03-09 MED ORDER — BUPROPION HCL ER (XL) 300 MG PO TB24: 300.0000 mg | ORAL_TABLET | Freq: Every day | ORAL | Status: DC

## 2015-03-09 NOTE — Patient Instructions (Addendum)
I have sent a prescription to the pharmacy for Wellbutrin, please take this every day. I want you to make an appointment with Dr. Dorisann Frames.   Start getting out of the house and exercising as well as eating healthy clean foods, this wil help increase your mood.   Follow up with me in 3 months    [name] , Thank you for taking time to come for your Medicare Wellness Visit. I appreciate your ongoing commitment to your health goals. Please review the following plan we discussed and let me know if I can assist you in the future.   These are the goals we discussed: Goals    . Decrease soda or juice intake    . Exercise 150 minutes per week (moderate activity)    . Increase water intake       This is a list of the screening recommended for you and due dates:  Health Maintenance  Topic Date Due  . Colon Cancer Screening  01/02/1990  . Shingles Vaccine  01/03/2000  . DEXA scan (bone density measurement)  01/02/2005  . Flu Shot  04/16/2015  . Tetanus Vaccine  12/03/2024  . Pneumonia vaccines  Completed       Diabetes Mellitus and Food It is important for you to manage your blood sugar (glucose) level. Your blood glucose level can be greatly affected by what you eat. Eating healthier foods in the appropriate amounts throughout the day at about the same time each day will help you control your blood glucose level. It can also help slow or prevent worsening of your diabetes mellitus. Healthy eating may even help you improve the level of your blood pressure and reach or maintain a healthy weight.  HOW CAN FOOD AFFECT ME? Carbohydrates Carbohydrates affect your blood glucose level more than any other type of food. Your dietitian will help you determine how many carbohydrates to eat at each meal and teach you how to count carbohydrates. Counting carbohydrates is important to keep your blood glucose at a healthy level, especially if you are using insulin or taking certain medicines for diabetes  mellitus. Alcohol Alcohol can cause sudden decreases in blood glucose (hypoglycemia), especially if you use insulin or take certain medicines for diabetes mellitus. Hypoglycemia can be a life-threatening condition. Symptoms of hypoglycemia (sleepiness, dizziness, and disorientation) are similar to symptoms of having too much alcohol.  If your health care provider has given you approval to drink alcohol, do so in moderation and use the following guidelines:  Women should not have more than one drink per day, and men should not have more than two drinks per day. One drink is equal to:  12 oz of beer.  5 oz of wine.  1 oz of hard liquor.  Do not drink on an empty stomach.  Keep yourself hydrated. Have water, diet soda, or unsweetened iced tea.  Regular soda, juice, and other mixers might contain a lot of carbohydrates and should be counted. WHAT FOODS ARE NOT RECOMMENDED? As you make food choices, it is important to remember that all foods are not the same. Some foods have fewer nutrients per serving than other foods, even though they might have the same number of calories or carbohydrates. It is difficult to get your body what it needs when you eat foods with fewer nutrients. Examples of foods that you should avoid that are high in calories and carbohydrates but low in nutrients include:  Trans fats (most processed foods list trans fats on  the Nutrition Facts label).  Regular soda.  Juice.  Candy.  Sweets, such as cake, pie, doughnuts, and cookies.  Fried foods. WHAT FOODS CAN I EAT? Have nutrient-rich foods, which will nourish your body and keep you healthy. The food you should eat also will depend on several factors, including:  The calories you need.  The medicines you take.  Your weight.  Your blood glucose level.  Your blood pressure level.  Your cholesterol level. You also should eat a variety of foods, including:  Protein, such as meat, poultry, fish, tofu, nuts,  and seeds (lean animal proteins are best).  Fruits.  Vegetables.  Dairy products, such as milk, cheese, and yogurt (low fat is best).  Breads, grains, pasta, cereal, rice, and beans.  Fats such as olive oil, trans fat-free margarine, canola oil, avocado, and olives. DOES EVERYONE WITH DIABETES MELLITUS HAVE THE SAME MEAL PLAN? Because every person with diabetes mellitus is different, there is not one meal plan that works for everyone. It is very important that you meet with a dietitian who will help you create a meal plan that is just right for you. Document Released: 05/29/2005 Document Revised: 09/06/2013 Document Reviewed: 07/29/2013 [facility] Patient Information 2015 [place], [place]. This information is not intended to replace advice given to you by your health care provider. Make sure you discuss any questions you have with your health care provider.

## 2015-03-09 NOTE — Progress Notes (Signed)
Pre visit review using our clinic review tool, if applicable. No additional management support is needed unless otherwise documented below in the visit note. 

## 2015-03-09 NOTE — Progress Notes (Signed)
Subjective:  Patient presents today for their annual wellness visit.    ADHD - She is currently on 20 mg of Adderall. She has followed up with Dr. Glennon Hamilton. Who told her that she did not have ADHD but was severely depressed. She continues to be depressed, " I think it is a chemical imbalance. I have a wonderful husband and nice home, I have everything to be be happy for." Denies SI  PHQ 9 score of 21. She is severly depressed and will continue to see Dr. Dorisann Frames.  She has started Wellbutrin again, about two weeks ago . She would like to have a refill for this medication  HTN She brought in her HTN journal. Her blood pressures range from 629 -476 systolic. She has no issues with headache, dizziness, blurred vision nor has she had any syncopal episodes.    Preventive Screening-Counseling & Management  Smoking Status: Former Smoker, quit in 1970 Second Hand Smoking status:No smokers in home  Risk Factors Regular exercise:Does not exercise Diet: Does not follow a diet  Fall Risk: None  Cardiac risk factors:  advanced age (older than 34 for men, 51 for women) Yes Hyperlipidemia Yes No diabetes.  Family History: yes  Depression Screen None. PHQ 9= 21  Activities of Daily Living Independent ADLs and IADLs   Hearing Difficulties: Yes, she has hearing aids but does not use them  Cognitive Testing No reported trouble.   Normal 3 word recall  List the Names of Other Physician/Practitioners you currently use: 1.Dwyane Dee - Endocrine 2. Vaughan Browner, had eye exam 9 months ago  Immunization History  Administered Date(s) Administered  . Influenza Split 07/17/2011, 06/30/2012  . Influenza Whole 07/20/2007, 06/09/2008, 07/20/2009, 08/22/2010  . Influenza, High Dose Seasonal PF 07/25/2013  . Influenza,inj,Quad PF,36+ Mos 07/10/2014  . Pneumococcal Conjugate-13 07/10/2014  . Pneumococcal Polysaccharide-23 09/16/2003, 10/16/2011  . Td 09/15/2005  . Tdap 12/04/2014    Required Immunizations needed today: Shingles - she declined due to cost  Screening tests- She needs to schedule colonoscopy Health Maintenance Due  Topic Date Due  . COLONOSCOPY  01/02/1990  . ZOSTAVAX  01/03/2000  . DEXA SCAN  01/02/2005    ROS- No pertinent positives discovered in course of AWV  The following were reviewed and entered/updated in epic: Past Medical History  Diagnosis Date  . HYPERLIPIDEMIA NEC/NOS 04/19/2007  . HYPERTENSION 04/07/2007  . ALLERGIC RHINITIS 11/30/2008  . ASTHMA UNSPECIFIED WITH EXACERBATION 02/17/2008  . Esophageal reflux 11/08/2008  . OSTEOARTHRITIS 04/07/2007  . OSTEOPENIA 03/10/2008  . INSOMNIA 08/07/2010  . CAROTID BRUIT, LEFT 04/19/2007  . CLOSED FRACTURE OF METATARSAL BONE 06/04/2009  . ALLERGIC RHINITIS 11/30/2008  . Closed fracture of lateral malleolus 06/04/2009  . GANGLION CYST 04/19/2007  . UTI 07/20/2009   Patient Active Problem List   Diagnosis Date Noted  . Laceration of finger of left hand 12/15/2014  . Cyst in hand 08/23/2014  . 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 Preventative health care 07/10/2014  . Abnormal TSH 07/10/2014  . Attention deficit disorder 07/10/2014  . Obesity (BMI 30-39.9) 12/26/2013  . GERD (gastroesophageal reflux disease) 11/17/2012  . Depression with anxiety 10/28/2010  . INSOMNIA 08/07/2010  . ALLERGIC RHINITIS 11/30/2008  . OSTEOPENIA 03/10/2008  . Hyperlipidemia 04/19/2007  . CAROTID BRUIT, LEFT 04/19/2007  . Essential hypertension 04/07/2007  . OSTEOARTHRITIS 04/07/2007   Past Surgical History  Procedure Laterality Date  . Tubal ligation    . Total hip arthroplasty    . Knuckle replacement  Family History  Problem Relation Age of Onset  . Heart disease Mother     Mother also has significant carotid artery stenosis  . COPD Father   . Alcoholism Father   . Cancer Brother   . Alzheimer's disease Sister   . Thyroid disease Sister     Goiter  . Breast cancer Neg Hx     Medications-  reviewed and updated Current Outpatient Prescriptions  Medication Sig Dispense Refill  . amphetamine-dextroamphetamine (ADDERALL) 15 MG tablet Take 1 tablet by mouth 2 (two) times daily. 60 tablet 0  . aspirin-acetaminophen-caffeine (EXCEDRIN MIGRAINE) 409-811-91 MG per tablet Take 1 tablet by mouth every 6 (six) hours as needed. (Patient taking differently: Take 2 tablets by mouth daily as needed for headache or migraine. ) 30 tablet 0  . calcium carbonate (TUMS - DOSED IN MG ELEMENTAL CALCIUM) 500 MG chewable tablet Chew 1-2 tablets by mouth 2 (two) times daily.     . Cyanocobalamin (B-12 PO) Take 1 tablet by mouth daily.    Marland Kitchen glucosamine-chondroitin 500-400 MG tablet Take 1 tablet by mouth 3 (three) times daily.    Marland Kitchen ibuprofen (ADVIL,MOTRIN) 800 MG tablet Take 1 tablet (800 mg total) by mouth 3 (three) times daily. 6 tablet 0  . levothyroxine (SYNTHROID, LEVOTHROID) 75 MCG tablet Take 1 tablet (75 mcg total) by mouth daily. 90 tablet 3  . Multiple Vitamin (MULTIVITAMIN) capsule Take 1 capsule by mouth daily.      . ranitidine (ZANTAC) 150 MG tablet Take 1 tablet (150 mg total) by mouth 2 (two) times daily as needed for heartburn. 180 tablet 1  . valsartan-hydrochlorothiazide (DIOVAN-HCT) 320-25 MG per tablet Take 1 tablet by mouth daily. 90 tablet 1  . VOLTAREN 1 % GEL APPLY   TOPICALLY TO AFFECTED AREA 4 TIMES DAILY (Patient taking differently: APPLY   TOPICALLY TO AFFECTED AREA 4 TIMES DAILY AS NEEDED FOR PAIN) 100 g 11  . buPROPion (WELLBUTRIN XL) 300 MG 24 hr tablet Take 1 tablet (300 mg total) by mouth daily. 30 tablet 6  . Melatonin 3 MG TABS Take 3 mg by mouth at bedtime.      No current facility-administered medications for this visit.    Allergies-reviewed and updated Allergies  Allergen Reactions  . Cephalexin Other (See Comments)    REACTION IS UNKNOWN    History   Social History  . Marital Status: Married    Spouse Name: N/A  . Number of Children: N/A  . Years of  Education: N/A   Occupational History  . retired    Social History Main Topics  . Smoking status: Former Smoker -- 1.50 packs/day for 20 years    Types: Cigarettes    Quit date: 09/15/1968  . Smokeless tobacco: Never Used     Comment: smoked 1.5 for  20 years  . Alcohol Use: No  . Drug Use: No  . Sexual Activity: Yes   Other Topics Concern  . None   Social History Narrative   Married for 50 years. She has 1 son that age 23   Retired from [employer]          Objective: BP 134/90 mmHg  Temp(Src) 97.9 F (36.6 C) (Oral)  Ht 5\' 4"  (1.626 m)  Wt 180 lb 14.4 oz (82.056 kg)  BMI 31.04 kg/m2 Gen: NAD, resting comfortably. She is obese HEENT: Mucous membranes are moist. Oropharynx normal. Has upper dentures. Neck: no thyromegaly, no JVD, no, adenopathy  CV: RRR no murmurs rubs  or gallops. No carotid bruit Lungs: CTAB no crackles,rhonchi. She has wheezing in upper lobes Abdomen: soft/nontender/nondistended/normal bowel sounds. No rebound or guarding.  Ext: no edema Breast: breast exam normal. No, lumps, masses, discharge or dimpling.  Skin: warm, dry, no rash, no abnormal moles Neuro: grossly normal, moves all extremities, PERRLA  Assessment/Plan:  1. Medicare annual wellness visit, initial - Follow up in one year for MWE - Follow up as needed - Start exercising a tleast 30 minutes a day. - Start eating healthy.  - 10 pound weight reduction in next six months.  - Will follow up in regard to labs.   2. Hyperlipidemia - Basic metabolic panel - Hepatic function panel - Lipid panel - CBC with Differential/Platelet  3. Depression with anxiety - Continue to see Behavioral health  - We discussed life style manangent for depression and activities that she can do to help relieve depression.  - buPROPion (WELLBUTRIN XL) 300 MG 24 hr tablet; Take 1 tablet (300 mg total) by mouth daily.  Dispense: 30 tablet; Refill: 6  4. Essential hypertension - Continue  to monitor blood pressure.  - No change to medications.  - Basic metabolic panel - Hepatic function panel - Lipid panel - POCT urinalysis dipstick - TSH - CBC with Differential/Platelet  5. Abnormal TSH - TSH  6. Post-menopausal  - DG Bone Density; Future  These are the goals we discussed: Goals    . Decrease soda or juice intake    . Exercise 150 minutes per week (moderate activity)    . Increase water intake       This is a list of the screening recommended for you and due dates:  Health Maintenance  Topic Date Due  . Colon Cancer Screening  01/02/1990  . Shingles Vaccine  01/03/2000  . DEXA scan (bone density measurement)  01/02/2005  . Flu Shot  04/16/2015  . Tetanus Vaccine  12/03/2024  . Pneumonia vaccines  Completed     Return precautions advised.   Orders Placed This Encounter  Procedures  . DG Bone Density    Standing Status: Future     Number of Occurrences:      Standing Expiration Date: 05/07/2016    Order Specific Question:  Reason for Exam (SYMPTOM  OR DIAGNOSIS REQUIRED)    Answer:  osteoporosis screening in post-menopausal female    Order Specific Question:  Preferred imaging location?    Answer:  [facility]  . Basic metabolic panel  . Hepatic function panel  . Lipid panel  . TSH  . CBC with Differential/Platelet  . POCT urinalysis dipstick    Meds ordered this encounter  Medications  . buPROPion (WELLBUTRIN XL) 300 MG 24 hr tablet    Sig: Take 1 tablet (300 mg total) by mouth daily.    Dispense:  30 tablet    Refill:  6    Order Specific Question:  Supervising Provider    Answer:  Eulas Post [phone]

## 2015-03-13 ENCOUNTER — Other Ambulatory Visit: Payer: Self-pay | Admitting: Endocrinology

## 2015-03-13 DIAGNOSIS — E0520 Thyrotoxicosis with toxic multinodular goiter without thyrotoxic crisis or storm: Secondary | ICD-10-CM | POA: Diagnosis not present

## 2015-03-13 LAB — T3, FREE: T3 FREE: 2.7 pg/mL (ref 2.3–4.2)

## 2015-03-13 LAB — T4, FREE: Free T4: 1.2 ng/dL (ref 0.80–1.80)

## 2015-03-15 LAB — TSH: TSH: 4.387 u[IU]/mL (ref 0.350–4.500)

## 2015-03-16 DIAGNOSIS — L820 Inflamed seborrheic keratosis: Secondary | ICD-10-CM | POA: Diagnosis not present

## 2015-03-16 DIAGNOSIS — D485 Neoplasm of uncertain behavior of skin: Secondary | ICD-10-CM | POA: Diagnosis not present

## 2015-03-28 ENCOUNTER — Encounter: Payer: Self-pay | Admitting: Adult Health

## 2015-03-29 ENCOUNTER — Other Ambulatory Visit: Payer: Self-pay | Admitting: Adult Health

## 2015-03-29 DIAGNOSIS — Z09 Encounter for follow-up examination after completed treatment for conditions other than malignant neoplasm: Secondary | ICD-10-CM

## 2015-03-29 MED ORDER — AMPHETAMINE-DEXTROAMPHETAMINE 5 MG PO TABS: 5.0000 mg | ORAL_TABLET | Freq: Every day | ORAL | Status: DC

## 2015-03-29 NOTE — Telephone Encounter (Signed)
Spoke to patient on the phone. We are trying to titrate her off the medication. I will prescribe her one more month of Adderall 5 mg daily. This will be the last prescription of Adderall I write for her. If she wants to continue it than she needs to find another provider.   She has seen Dr. Glennon Hamilton who advised her that she did not have ADHD

## 2015-04-15 ENCOUNTER — Encounter: Payer: Self-pay | Admitting: Adult Health

## 2015-04-23 ENCOUNTER — Encounter: Payer: Self-pay | Admitting: Adult Health

## 2015-04-23 ENCOUNTER — Other Ambulatory Visit: Payer: Self-pay | Admitting: Adult Health

## 2015-04-23 DIAGNOSIS — IMO0002 Reserved for concepts with insufficient information to code with codable children: Secondary | ICD-10-CM

## 2015-04-23 NOTE — Telephone Encounter (Signed)
Left a message for pt to return call.  Will attempt to call pt at a later time.

## 2015-04-25 DIAGNOSIS — L729 Follicular cyst of the skin and subcutaneous tissue, unspecified: Secondary | ICD-10-CM | POA: Diagnosis not present

## 2015-04-25 DIAGNOSIS — L821 Other seborrheic keratosis: Secondary | ICD-10-CM | POA: Diagnosis not present

## 2015-04-25 DIAGNOSIS — M7130 Other bursal cyst, unspecified site: Secondary | ICD-10-CM | POA: Diagnosis not present

## 2015-04-25 NOTE — Telephone Encounter (Signed)
Left a message for pt to return call 

## 2015-05-08 ENCOUNTER — Other Ambulatory Visit: Payer: Self-pay | Admitting: Endocrinology

## 2015-05-08 ENCOUNTER — Encounter: Payer: Self-pay | Admitting: Endocrinology

## 2015-05-08 ENCOUNTER — Other Ambulatory Visit: Payer: Self-pay | Admitting: *Deleted

## 2015-05-08 DIAGNOSIS — E0520 Thyrotoxicosis with toxic multinodular goiter without thyrotoxic crisis or storm: Secondary | ICD-10-CM

## 2015-05-08 LAB — TSH: TSH: 4.41 u[IU]/mL (ref 0.350–4.500)

## 2015-05-08 LAB — T4, FREE: FREE T4: 1.1 ng/dL (ref 0.80–1.80)

## 2015-05-10 ENCOUNTER — Ambulatory Visit (INDEPENDENT_AMBULATORY_CARE_PROVIDER_SITE_OTHER): Payer: Medicare Other | Admitting: Endocrinology

## 2015-05-10 ENCOUNTER — Encounter: Payer: Self-pay | Admitting: Endocrinology

## 2015-05-10 VITALS — BP 130/80 | HR 72 | Temp 98.0°F | Resp 16 | Ht 64.0 in | Wt 181.8 lb

## 2015-05-10 DIAGNOSIS — E890 Postprocedural hypothyroidism: Secondary | ICD-10-CM

## 2015-05-10 MED ORDER — LEVOTHYROXINE SODIUM 88 MCG PO TABS: 88.0000 ug | ORAL_TABLET | Freq: Every day | ORAL | Status: DC

## 2015-05-10 NOTE — Progress Notes (Signed)
Patient ID: [name], female   DOB: 04/23/1941, 74 y.o.   MRN: [id number]    Reason for Appointment:  Follow-up of thyroid    History of Present Illness:   Previous history: She had a low TSH level as far back as 7 years ago She had symptoms of feeling hot and sweaty with physical activity, some fatigue and palpitations She has had long history of anxiety and depression   Because of her symptoms and TSH in the hyperthyroid range as well as uptake of 34% she was given I-131 treatment with 30 mCi on 09/27/14. Subsequently she was subjectively better  Recent history:  When TSH  was  as high as 15.9 she was started on 75 g levothyroxine in 12/2014 With this her energy level did improve However she still complains of some fatigue but also is having some insomnia No further hair loss  Her TSH has been fairly consistent in the upper normal range now, currently taking 75 g with an extra half tablet once a week    Wt Readings from Last 3 Encounters:  05/10/15 181 lb 12.8 oz (82.464 kg)  03/09/15 180 lb 14.4 oz (82.056 kg)  02/19/15 184 lb 3.2 oz (83.553 kg)    Lab Results  Component Value Date   TSH 4.410 05/08/2015   TSH 4.387 03/13/2015   TSH 2.99 03/09/2015   FREET4 1.10 05/08/2015   FREET4 1.20 03/13/2015   FREET4 1.14 02/02/2015         Medication List       This list is accurate as of: 05/10/15  3:42 PM.  Always use your most recent med list.               amphetamine-dextroamphetamine 5 MG tablet  Commonly known as:  ADDERALL  Take 1 tablet (5 mg total) by mouth daily.     aspirin-acetaminophen-caffeine 099-833-82 MG per tablet  Commonly known as:  EXCEDRIN MIGRAINE  Take 1 tablet by mouth every 6 (six) hours as needed.     B-12 PO  Take 1 tablet by mouth daily.     buPROPion 300 MG 24 hr tablet  Commonly known as:  WELLBUTRIN XL  Take 1 tablet (300 mg total) by mouth daily.     calcium carbonate 500 MG chewable tablet  Commonly known as:  TUMS  - dosed in mg elemental calcium  Chew 1-2 tablets by mouth 2 (two) times daily.     glucosamine-chondroitin 500-400 MG tablet  Take 1 tablet by mouth 3 (three) times daily.     ibuprofen 800 MG tablet  Commonly known as:  ADVIL,MOTRIN  Take 1 tablet (800 mg total) by mouth 3 (three) times daily.     levothyroxine 75 MCG tablet  Commonly known as:  SYNTHROID, LEVOTHROID  Take 1 tablet (75 mcg total) by mouth daily.     Melatonin 3 MG Tabs  Take 3 mg by mouth at bedtime.     multivitamin capsule  Take 1 capsule by mouth daily.     ranitidine 150 MG tablet  Commonly known as:  ZANTAC  Take 1 tablet (150 mg total) by mouth 2 (two) times daily as needed for heartburn.     valsartan-hydrochlorothiazide 320-25 MG per tablet  Commonly known as:  DIOVAN-HCT  Take 1 tablet by mouth daily.     VOLTAREN 1 % Gel  Generic drug:  diclofenac sodium  APPLY   TOPICALLY TO AFFECTED AREA 4 TIMES DAILY  Allergies:  Allergies  Allergen Reactions  . Cephalexin Other (See Comments)    REACTION IS UNKNOWN    Past Medical History  Diagnosis Date  . HYPERLIPIDEMIA NEC/NOS 04/19/2007  . HYPERTENSION 04/07/2007  . ALLERGIC RHINITIS 11/30/2008  . ASTHMA UNSPECIFIED WITH EXACERBATION 02/17/2008  . Esophageal reflux 11/08/2008  . OSTEOARTHRITIS 04/07/2007  . OSTEOPENIA 03/10/2008  . INSOMNIA 08/07/2010  . CAROTID BRUIT, LEFT 04/19/2007  . CLOSED FRACTURE OF METATARSAL BONE 06/04/2009  . ALLERGIC RHINITIS 11/30/2008  . Closed fracture of lateral malleolus 06/04/2009  . GANGLION CYST 04/19/2007  . UTI 07/20/2009    Past Surgical History  Procedure Laterality Date  . Tubal ligation    . Total hip arthroplasty    . Knuckle replacement      Family History  Problem Relation Age of Onset  . Heart disease Mother     Mother also has significant carotid artery stenosis  . COPD Father   . Alcoholism Father   . Cancer Brother   . Alzheimer's disease Sister   . Thyroid disease Sister     Goiter   . Breast cancer Neg Hx     Social History:  reports that she quit smoking about 46 years ago. Her smoking use included Cigarettes. She has a 30 pack-year smoking history. She has never used smokeless tobacco. She reports that she does not drink alcohol or use illicit drugs.   Review of Systems:  There is a history of high blood pressure treated by PCP .      Review of Systems  Psychiatric/Behavioral: Positive for depression.      Examination:   BP 130/80 mmHg  Pulse 72  Temp(Src) 98 F (36.7 C)  Resp 16  Ht 5\' 4"  (1.626 m)  Wt 181 lb 12.8 oz (82.464 kg)  BMI 31.19 kg/m2  SpO2 95%   REFLEXES: at biceps are normal.    Assessment/Plan:    She has been on supplementation for post ablative hypothyroidism Since she had a baseline TSH of about 16 she is now taking 75 g levothyroxine, 7-1/2 tablets per week   She is feeling somewhat better although she has many other issues tending to cause fatigue. Currently TSH is upper normal  with current regimen  Since she has some nonspecific fatigue will have her again increase her dose by another half tablet weekly   She will then be switched to 88 g daily   Patient Instructions  Take extra 1/2 pill twice a week  Next Rx 1 daily       [facility] 05/10/2015

## 2015-05-10 NOTE — Patient Instructions (Signed)
Take extra 1/2 pill twice a week  Next Rx 1 daily

## 2015-05-11 ENCOUNTER — Encounter: Payer: Self-pay | Admitting: Internal Medicine

## 2015-05-12 MED ORDER — LEVOTHYROXINE SODIUM 88 MCG PO TABS: 88.0000 ug | ORAL_TABLET | Freq: Every day | ORAL | Status: DC

## 2015-05-14 ENCOUNTER — Telehealth: Payer: Self-pay | Admitting: Internal Medicine

## 2015-05-14 NOTE — Telephone Encounter (Signed)
Patient Name: [name] DOB: 1941-07-09 Initial Comment Caller States vaginal smell, no energy, feels it is a UTI, has had them many times in past. no other symptoms Nurse Assessment Nurse: Vallery Sa, RN, Tye [place] Date/Time (Eastern Time): 05/14/2015 1:28:34 PM Confirm and document reason for call. If symptomatic, describe symptoms. ---Caller thinks that she developed another urinary tract infection 4 days ago (bad odor to urine, weak). No fever. Has the patient traveled out of the country within the last 30 days? ---No Does the patient require triage? ---Yes Related visit to physician within the last 2 weeks? ---No Does the PT have any chronic conditions? (i.e. diabetes, asthma, etc.) ---Yes List chronic conditions. ---High Blood Pressure, Anxiety, Depression, Schedule for dental surgery this Wednesday, Obesity Guidelines Guideline Title Affirmed Question Affirmed Notes Urinary Symptoms Bad or foul-smelling urine Final Disposition User See Physician within 24 Hours [place], RN, Tye [place] Comments Schedule appointment 05/15/15 at 10:30 with Dr. Tylene Fantasia. Referrals REFERRED TO PCP OFFICE Disagree/Comply: Comply

## 2015-05-15 ENCOUNTER — Ambulatory Visit (INDEPENDENT_AMBULATORY_CARE_PROVIDER_SITE_OTHER): Payer: Medicare Other | Admitting: Adult Health

## 2015-05-15 ENCOUNTER — Encounter: Payer: Self-pay | Admitting: Adult Health

## 2015-05-15 VITALS — BP 180/90 | Temp 98.1°F | Ht 64.0 in | Wt 184.4 lb

## 2015-05-15 DIAGNOSIS — R8299 Other abnormal findings in urine: Secondary | ICD-10-CM | POA: Diagnosis not present

## 2015-05-15 DIAGNOSIS — R8290 Unspecified abnormal findings in urine: Secondary | ICD-10-CM | POA: Diagnosis not present

## 2015-05-15 DIAGNOSIS — N3000 Acute cystitis without hematuria: Secondary | ICD-10-CM

## 2015-05-15 LAB — POCT URINALYSIS DIPSTICK
BILIRUBIN UA: NEGATIVE
GLUCOSE UA: NEGATIVE
KETONES UA: NEGATIVE
Nitrite, UA: NEGATIVE
Protein, UA: NEGATIVE
SPEC GRAV UA: 1.015
UROBILINOGEN UA: 0.2
pH, UA: 6

## 2015-05-15 MED ORDER — CIPROFLOXACIN HCL 250 MG PO TABS: 250.0000 mg | ORAL_TABLET | Freq: Two times a day (BID) | ORAL | Status: DC

## 2015-05-15 NOTE — Progress Notes (Signed)
Pre visit review using our clinic review tool, if applicable. No additional management support is needed unless otherwise documented below in the visit note. 

## 2015-05-15 NOTE — Progress Notes (Signed)
   Subjective:    Patient ID: [name], female    DOB: 02-10-1941, 74 y.o.   MRN: [id number]  HPI 74 year old female who presents to the office today for UTI type symptoms for less than one week.   She endorses that her urine has an odor and that she has fatigue.   Denies urgency,  burning with urination, fever, flank pain, urgency, hesitancy or feeling of incomplete emptying.  She has a UTI's once every three months. Has never seen urology.    Review of Systems  Constitutional: Positive for fatigue. Negative for fever, chills and diaphoresis.  Genitourinary: Positive for decreased urine volume. Negative for urgency, frequency, hematuria, flank pain, difficulty urinating and pelvic pain.  Musculoskeletal: Negative.   Neurological: Negative.   All other systems reviewed and are negative.      Objective:   Physical Exam  Constitutional: She is oriented to person, place, and time. She appears well-developed and well-nourished. No distress.  Cardiovascular: Normal rate, regular rhythm and intact distal pulses.  Exam reveals no gallop and no friction rub.   No murmur heard. Pulmonary/Chest: Effort normal and breath sounds normal. No respiratory distress. She has no wheezes. She has no rales.  Abdominal: Soft. Bowel sounds are normal. She exhibits no distension and no mass. There is no tenderness. There is no rebound and no guarding.  Neurological: She is alert and oriented to person, place, and time.  Skin: Skin is warm and dry. She is not diaphoretic.  Psychiatric: She has a normal mood and affect. Her behavior is normal. Judgment and thought content normal.  Nursing note and vitals reviewed.     Assessment & Plan:  1. Acute cystitis without hematuria - POCT urinalysis dipstick; Standing - ciprofloxacin (CIPRO) 250 MG tablet; Take 1 tablet (250 mg total) by mouth 2 (two) times daily.  Dispense: 6 tablet; Refill: 0 - Ambulatory referral to Urology - Will follow up if needed  on urine culture

## 2015-05-15 NOTE — Patient Instructions (Signed)
It was great seeing you again today!  Take the Cipro twice a day for 3 days.   Call your dentist to let them know that you are being treated for a UTI.   If you need anything else, please let me know

## 2015-05-15 NOTE — Addendum Note (Signed)
Addended by: Elmer Picker on: 05/15/2015 12:14 PM   Modules accepted: Orders

## 2015-05-18 LAB — URINE CULTURE

## 2015-05-25 ENCOUNTER — Other Ambulatory Visit: Payer: Self-pay | Admitting: Adult Health

## 2015-05-28 MED ORDER — AMPHETAMINE-DEXTROAMPHETAMINE 5 MG PO TABS: 5.0000 mg | ORAL_TABLET | Freq: Every day | ORAL | Status: DC

## 2015-05-28 NOTE — Telephone Encounter (Signed)
Rx printed and mychart message sent to pt to make aware.

## 2015-05-28 NOTE — Addendum Note (Signed)
Addended by: Colleen Can on: 05/28/2015 02:13 PM   Modules accepted: Orders

## 2015-05-30 MED ORDER — FLUOXETINE HCL 20 MG PO TABS: 20.0000 mg | ORAL_TABLET | Freq: Every day | ORAL | Status: DC

## 2015-05-30 NOTE — Addendum Note (Signed)
Addended by: Westley Hummer B on: 05/30/2015 12:24 PM   Modules accepted: Orders

## 2015-06-01 ENCOUNTER — Inpatient Hospital Stay: Admission: RE | Admit: 2015-06-01 | Payer: Self-pay | Source: Ambulatory Visit

## 2015-06-04 ENCOUNTER — Ambulatory Visit: Payer: Medicare Other | Admitting: Internal Medicine

## 2015-06-08 ENCOUNTER — Ambulatory Visit: Payer: Self-pay | Admitting: Adult Health

## 2015-06-18 ENCOUNTER — Ambulatory Visit: Payer: Medicare Other | Admitting: Internal Medicine

## 2015-07-10 DIAGNOSIS — L820 Inflamed seborrheic keratosis: Secondary | ICD-10-CM | POA: Diagnosis not present

## 2015-07-10 DIAGNOSIS — M7130 Other bursal cyst, unspecified site: Secondary | ICD-10-CM | POA: Diagnosis not present

## 2015-07-17 ENCOUNTER — Encounter: Payer: Self-pay | Admitting: Internal Medicine

## 2015-07-17 ENCOUNTER — Ambulatory Visit (INDEPENDENT_AMBULATORY_CARE_PROVIDER_SITE_OTHER): Payer: Medicare Other | Admitting: Internal Medicine

## 2015-07-17 VITALS — BP 150/90 | HR 77 | Temp 98.4°F | Resp 20 | Ht 64.0 in | Wt 185.0 lb

## 2015-07-17 DIAGNOSIS — M159 Polyosteoarthritis, unspecified: Secondary | ICD-10-CM

## 2015-07-17 DIAGNOSIS — Z23 Encounter for immunization: Secondary | ICD-10-CM

## 2015-07-17 DIAGNOSIS — F909 Attention-deficit hyperactivity disorder, unspecified type: Secondary | ICD-10-CM | POA: Diagnosis not present

## 2015-07-17 DIAGNOSIS — M150 Primary generalized (osteo)arthritis: Secondary | ICD-10-CM

## 2015-07-17 DIAGNOSIS — F418 Other specified anxiety disorders: Secondary | ICD-10-CM | POA: Diagnosis not present

## 2015-07-17 DIAGNOSIS — I10 Essential (primary) hypertension: Secondary | ICD-10-CM

## 2015-07-17 DIAGNOSIS — F988 Other specified behavioral and emotional disorders with onset usually occurring in childhood and adolescence: Secondary | ICD-10-CM

## 2015-07-17 MED ORDER — AMPHETAMINE-DEXTROAMPHETAMINE 5 MG PO TABS: 5.0000 mg | ORAL_TABLET | Freq: Two times a day (BID) | ORAL | Status: DC

## 2015-07-17 MED ORDER — AMPHETAMINE-DEXTROAMPHETAMINE 5 MG PO TABS: 5.0000 mg | ORAL_TABLET | Freq: Every day | ORAL | Status: DC

## 2015-07-17 NOTE — Progress Notes (Signed)
Subjective:    Patient ID: [name], female    DOB: July 12, 1941, 74 y.o.   MRN: [id number]  HPI  74 year old patient who is seen today in follow-up.  In the absence of her PCP.  She has hypertension, osteoarthritis and is followed by endocrinology for thyroid disorder. She has a long history of ADD, but more recently was evaluated by behavioral health who did not feel that she had ADD. The patient has been tapered off Adderall and has been totally off for about one month.  She states she feels poorly with very little motivation.  She also describes difficulty keeping organized and completing tasks She states that she has had a number of trials off medication over the years and does poorly  She felt improvement on Adderall 5 mg daily, but did not feel that was a sufficient dose  Past Medical History  Diagnosis Date  . HYPERLIPIDEMIA NEC/NOS 04/19/2007  . HYPERTENSION 04/07/2007  . ALLERGIC RHINITIS 11/30/2008  . ASTHMA UNSPECIFIED WITH EXACERBATION 02/17/2008  . Esophageal reflux 11/08/2008  . OSTEOARTHRITIS 04/07/2007  . OSTEOPENIA 03/10/2008  . INSOMNIA 08/07/2010  . CAROTID BRUIT, LEFT 04/19/2007  . CLOSED FRACTURE OF METATARSAL BONE 06/04/2009  . ALLERGIC RHINITIS 11/30/2008  . Closed fracture of lateral malleolus 06/04/2009  . GANGLION CYST 04/19/2007  . UTI 07/20/2009    Social History   Social History  . Marital Status: Married    Spouse Name: N/A  . Number of Children: N/A  . Years of Education: N/A   Occupational History  . retired    Social History Main Topics  . Smoking status: Former Smoker -- 1.50 packs/day for 20 years    Types: Cigarettes    Quit date: 09/15/1968  . Smokeless tobacco: Never Used     Comment: smoked 1.5 for  20 years  . Alcohol Use: No  . Drug Use: No  . Sexual Activity: Yes   Other Topics Concern  . Not on file   Social History Narrative   Married for 50 years. She has 1 son that age 55   Retired from [employer]          Past Surgical History  Procedure Laterality Date  . Tubal ligation    . Total hip arthroplasty    . Knuckle replacement      Family History  Problem Relation Age of Onset  . Heart disease Mother     Mother also has significant carotid artery stenosis  . COPD Father   . Alcoholism Father   . Cancer Brother   . Alzheimer's disease Sister   . Thyroid disease Sister     Goiter  . Breast cancer Neg Hx     Allergies  Allergen Reactions  . Cephalexin Other (See Comments)    REACTION IS UNKNOWN    Current Outpatient Prescriptions on File Prior to Visit  Medication Sig Dispense Refill  . aspirin-acetaminophen-caffeine (EXCEDRIN MIGRAINE) 250-250-65 MG per tablet Take 1 tablet by mouth every 6 (six) hours as needed. (Patient taking differently: Take 2 tablets by mouth daily as needed for headache or migraine. ) 30 tablet 0  . buPROPion (WELLBUTRIN XL) 300 MG 24 hr tablet Take 1 tablet (300 mg total) by mouth daily. 30 tablet 6  . calcium carbonate (TUMS - DOSED IN MG ELEMENTAL CALCIUM) 500 MG chewable tablet Chew 1-2 tablets by mouth 2 (two) times daily.     . Cyanocobalamin (B-12 PO) Take 1 tablet by mouth daily.    Marland Kitchen  FLUoxetine (PROZAC) 20 MG tablet Take 1 tablet (20 mg total) by mouth daily. 90 tablet 0  . glucosamine-chondroitin 500-400 MG tablet Take 1 tablet by mouth 3 (three) times daily.    Marland Kitchen ibuprofen (ADVIL,MOTRIN) 800 MG tablet Take 1 tablet (800 mg total) by mouth 3 (three) times daily. 6 tablet 0  . levothyroxine (SYNTHROID, LEVOTHROID) 88 MCG tablet Take 1 tablet (88 mcg total) by mouth daily. 90 tablet 3  . Melatonin 3 MG TABS Take 3 mg by mouth at bedtime.     . Multiple Vitamin (MULTIVITAMIN) capsule Take 1 capsule by mouth daily.      . ranitidine (ZANTAC) 150 MG tablet Take 1 tablet (150 mg total) by mouth 2 (two) times daily as needed for heartburn. 180 tablet 1  . valsartan-hydrochlorothiazide (DIOVAN-HCT) 320-25 MG per tablet Take 1 tablet by mouth daily. 90  tablet 1  . VOLTAREN 1 % GEL APPLY   TOPICALLY TO AFFECTED AREA 4 TIMES DAILY (Patient taking differently: APPLY   TOPICALLY TO AFFECTED AREA 4 TIMES DAILY AS NEEDED FOR PAIN) 100 g 11   No current facility-administered medications on file prior to visit.    BP 150/90 mmHg  Pulse 77  Temp(Src) 98.4 F (36.9 C) (Oral)  Resp 20  Ht 5\' 4"  (1.626 m)  Wt 185 lb (83.915 kg)  BMI 31.74 kg/m2  SpO2 96%     Review of Systems  Constitutional: Negative.   HENT: Negative for congestion, dental problem, hearing loss, rhinorrhea, sinus pressure, sore throat and tinnitus.   Eyes: Negative for pain, discharge and visual disturbance.  Respiratory: Negative for cough and shortness of breath.   Cardiovascular: Negative for chest pain, palpitations and leg swelling.  Gastrointestinal: Negative for nausea, vomiting, abdominal pain, diarrhea, constipation, blood in stool and abdominal distention.  Genitourinary: Negative for dysuria, urgency, frequency, hematuria, flank pain, vaginal bleeding, vaginal discharge, difficulty urinating, vaginal pain and pelvic pain.  Musculoskeletal: Negative for joint swelling, arthralgias and gait problem.  Skin: Negative for rash.  Neurological: Negative for dizziness, syncope, speech difficulty, weakness, numbness and headaches.  Hematological: Negative for adenopathy.  Psychiatric/Behavioral: Positive for decreased concentration. Negative for behavioral problems, dysphoric mood and agitation. The patient is not nervous/anxious.        Objective:   Physical Exam  Constitutional: She appears well-developed and well-nourished. No distress.  Psychiatric:  18.  Symptom checklist screening for adult ADHD completed:  Highly suggestive for ADD without hyperactivity          Assessment & Plan:   Probable ADHD.  Symptom checklist highly suggestive of ADD without hyperactivity.  Patient has done well on Adderall in the past and her dose has been successfully down  titrated.  Will continue at a minimal dose of 5 mg twice a day.  Follow-up PCP in 3 months Essential hypertension, stable Osteoarthritis Preventive health.  Flu vaccine administered

## 2015-07-17 NOTE — Progress Notes (Signed)
Pre visit review using our clinic review tool, if applicable. No additional management support is needed unless otherwise documented below in the visit note. 

## 2015-07-17 NOTE — Patient Instructions (Signed)
Return in 3 months for follow-up  Limit your sodium (Salt) intake  Please check your blood pressure on a regular basis.  If it is consistently greater than 150/90, please make an office appointment.

## 2015-08-01 ENCOUNTER — Ambulatory Visit: Payer: Self-pay | Admitting: Urology

## 2015-08-05 ENCOUNTER — Encounter: Payer: Self-pay | Admitting: Endocrinology

## 2015-08-07 ENCOUNTER — Other Ambulatory Visit: Payer: Self-pay | Admitting: *Deleted

## 2015-08-07 DIAGNOSIS — R7989 Other specified abnormal findings of blood chemistry: Secondary | ICD-10-CM

## 2015-08-08 ENCOUNTER — Other Ambulatory Visit: Payer: Self-pay | Admitting: Endocrinology

## 2015-08-08 ENCOUNTER — Other Ambulatory Visit: Payer: Self-pay | Admitting: Internal Medicine

## 2015-08-08 DIAGNOSIS — R7989 Other specified abnormal findings of blood chemistry: Secondary | ICD-10-CM | POA: Diagnosis not present

## 2015-08-08 DIAGNOSIS — E0520 Thyrotoxicosis with toxic multinodular goiter without thyrotoxic crisis or storm: Secondary | ICD-10-CM | POA: Diagnosis not present

## 2015-08-08 LAB — T4, FREE: Free T4: 1.31 ng/dL (ref 0.80–1.80)

## 2015-08-08 LAB — TSH: TSH: 3.624 u[IU]/mL (ref 0.350–4.500)

## 2015-08-13 ENCOUNTER — Ambulatory Visit (INDEPENDENT_AMBULATORY_CARE_PROVIDER_SITE_OTHER): Payer: Medicare Other | Admitting: Endocrinology

## 2015-08-13 ENCOUNTER — Encounter: Payer: Self-pay | Admitting: Endocrinology

## 2015-08-13 VITALS — BP 138/82 | HR 85 | Temp 98.6°F | Resp 14 | Ht 64.0 in | Wt 188.2 lb

## 2015-08-13 DIAGNOSIS — E890 Postprocedural hypothyroidism: Secondary | ICD-10-CM | POA: Diagnosis not present

## 2015-08-13 NOTE — Progress Notes (Signed)
Patient ID: [name], female   DOB: [birth year], 73 y.o.   MRN: [id number]    Reason for Appointment:  Follow-up of thyroid    History of Present Illness:   Previous history: She had a low TSH level as far back as 7 years ago She had symptoms of feeling hot and sweaty with physical activity, some fatigue and palpitations She has had long history of anxiety and depression   Because of her symptoms and TSH in the hyperthyroid range as well as uptake of 34% she was given I-131 treatment with 30 mCi on 09/27/14. Subsequently she was subjectively better When TSH  was  as high as 15.9 she was started on 75 g levothyroxine in 12/2014  Recent history:  Her TSH has been relatively better this year However she still complaining of some fatigue on her last visit when her TSH was 4.4 She also is having some chronic insomnia  Because of her continued symptoms of fatigue her dose was increased to 88 g in 04/2015 She thinks she has less fatigue now  No further hair loss  Her TSH has been fairly consistent in the upper normal range and  now slightly better at 3.6   Wt Readings from Last 3 Encounters:  08/13/15 188 lb 3.2 oz (85.367 kg)  07/17/15 185 lb (83.915 kg)  05/15/15 184 lb 6.4 oz (83.643 kg)    Lab Results  Component Value Date   TSH 3.624 08/08/2015   TSH 4.410 05/08/2015   TSH 4.387 03/13/2015   FREET4 1.31 08/08/2015   FREET4 1.10 05/08/2015   FREET4 1.20 03/13/2015         Medication List       This list is accurate as of: 08/13/15  3:32 PM.  Always use your most recent med list.               amphetamine-dextroamphetamine 5 MG tablet  Commonly known as:  ADDERALL  Take 1 tablet (5 mg total) by mouth 2 (two) times daily with a meal.     amphetamine-dextroamphetamine 5 MG tablet  Commonly known as:  ADDERALL  Take 1 tablet (5 mg total) by mouth 2 (two) times daily with a meal.     amphetamine-dextroamphetamine 5 MG tablet  Commonly known  as:  ADDERALL  Take 1 tablet (5 mg total) by mouth daily.     aspirin-acetaminophen-caffeine 250-250-65 MG tablet  Commonly known as:  EXCEDRIN MIGRAINE  Take 1 tablet by mouth every 6 (six) hours as needed.     B-12 PO  Take 1 tablet by mouth daily.     buPROPion 300 MG 24 hr tablet  Commonly known as:  WELLBUTRIN XL  Take 1 tablet (300 mg total) by mouth daily.     calcium carbonate 500 MG chewable tablet  Commonly known as:  TUMS - dosed in mg elemental calcium  Chew 1-2 tablets by mouth 2 (two) times daily.     FLUoxetine 20 MG tablet  Commonly known as:  PROZAC  Take 1 tablet (20 mg total) by mouth daily.     glucosamine-chondroitin 500-400 MG tablet  Take 1 tablet by mouth 3 (three) times daily.     ibuprofen 800 MG tablet  Commonly known as:  ADVIL,MOTRIN  Take 1 tablet (800 mg total) by mouth 3 (three) times daily.     levothyroxine 88 MCG tablet  Commonly known as:  SYNTHROID, LEVOTHROID  Take 1 tablet (88 mcg total) by mouth  daily.     Melatonin 3 MG Tabs  Take 3 mg by mouth at bedtime.     multivitamin capsule  Take 1 capsule by mouth daily.     ranitidine 150 MG tablet  Commonly known as:  ZANTAC  TAKE ONE TABLET BY MOUTH TWICE DAILY AS NEEDED FOR  HEARTBURN     valsartan-hydrochlorothiazide 320-25 MG tablet  Commonly known as:  DIOVAN-HCT  Take 1 tablet by mouth daily.     VOLTAREN 1 % Gel  Generic drug:  diclofenac sodium  APPLY   TOPICALLY TO AFFECTED AREA 4 TIMES DAILY        Allergies:  Allergies  Allergen Reactions  . Cephalexin Other (See Comments)    REACTION IS UNKNOWN    Past Medical History  Diagnosis Date  . HYPERLIPIDEMIA NEC/NOS 04/19/2007  . HYPERTENSION 04/07/2007  . ALLERGIC RHINITIS 11/30/2008  . ASTHMA UNSPECIFIED WITH EXACERBATION 02/17/2008  . Esophageal reflux 11/08/2008  . OSTEOARTHRITIS 04/07/2007  . OSTEOPENIA 03/10/2008  . INSOMNIA 08/07/2010  . CAROTID BRUIT, LEFT 04/19/2007  . CLOSED FRACTURE OF METATARSAL BONE  06/04/2009  . ALLERGIC RHINITIS 11/30/2008  . Closed fracture of lateral malleolus 06/04/2009  . GANGLION CYST 04/19/2007  . UTI 07/20/2009    Past Surgical History  Procedure Laterality Date  . Tubal ligation    . Total hip arthroplasty    . Knuckle replacement      Family History  Problem Relation Age of Onset  . Heart disease Mother     Mother also has significant carotid artery stenosis  . COPD Father   . Alcoholism Father   . Cancer Brother   . Alzheimer's disease Sister   . Thyroid disease Sister     Goiter  . Breast cancer Neg Hx     Social History:  reports that she quit smoking about 46 years ago. Her smoking use included Cigarettes. She has a 30 pack-year smoking history. She has never used smokeless tobacco. She reports that she does not drink alcohol or use illicit drugs.   Review of Systems:   has complaints of late insomnia, chronic   Review of Systems  Psychiatric/Behavioral: Positive for depression.      Examination:   BP 138/82 mmHg  Pulse 85  Temp(Src) 98.6 F (37 C)  Resp 14  Ht 5\' 4"  (1.626 m)  Wt 188 lb 3.2 oz (85.367 kg)  BMI 32.29 kg/m2  SpO2 98%   neck exam shows no thyroid enlargement or lymphadenopathy  REFLEXES: at biceps are normal.   Heart rate is regular   Assessment/Plan:    She has been on supplementation for post ablative hypothyroidism Since she had a baseline TSH of about 16 She has had issues such as depression and insomnia previously causing fatigue but she thinks she is feeling better with taking 88 g of levothyroxine daily She is compliant with her medication daily  She thinks she is gaining weight from not watching her diet  Since her TSH is excellent and her fatigue is improved she will stay on 88 g for the next 6 months     Patient Instructions  No change       Moet Mikulski 08/13/2015

## 2015-08-13 NOTE — Patient Instructions (Signed)
No change 

## 2015-08-14 ENCOUNTER — Other Ambulatory Visit: Payer: Self-pay | Admitting: Internal Medicine

## 2015-08-14 ENCOUNTER — Encounter: Payer: Self-pay | Admitting: Internal Medicine

## 2015-08-14 MED ORDER — AMPHETAMINE-DEXTROAMPHETAMINE 10 MG PO TABS: 10.0000 mg | ORAL_TABLET | Freq: Two times a day (BID) | ORAL | Status: DC

## 2015-08-14 NOTE — Telephone Encounter (Signed)
Pt notified Rx ready for pickup. Rx printed and signed.  

## 2015-08-19 ENCOUNTER — Encounter: Payer: Self-pay | Admitting: Adult Health

## 2015-08-20 NOTE — Telephone Encounter (Signed)
Pt was seen for a medicare wellness exam on 6.24.2016 with you and pt had an annual exam with Dr. Shawna Orleans on 10.26.2015. Pls advise.

## 2015-08-30 ENCOUNTER — Encounter: Payer: Self-pay | Admitting: Internal Medicine

## 2015-09-03 NOTE — Telephone Encounter (Signed)
Dr. Raliegh Ip, please advise what dosage of Adderall you want for pt? I am confused on dosage and is there suppose to be two different prescriptions now?

## 2015-09-04 ENCOUNTER — Encounter: Payer: Self-pay | Admitting: Internal Medicine

## 2015-09-04 MED ORDER — AMPHETAMINE-DEXTROAMPHETAMINE 20 MG PO TABS: 20.0000 mg | ORAL_TABLET | Freq: Every day | ORAL | Status: DC

## 2015-09-04 NOTE — Telephone Encounter (Signed)
Left detailed message on voicemail Rx's ready for pickup, will be at the front desk. Rx's printed and signed.

## 2015-09-06 DIAGNOSIS — H9113 Presbycusis, bilateral: Secondary | ICD-10-CM | POA: Diagnosis not present

## 2015-09-06 DIAGNOSIS — H903 Sensorineural hearing loss, bilateral: Secondary | ICD-10-CM | POA: Diagnosis not present

## 2015-09-07 ENCOUNTER — Other Ambulatory Visit: Payer: Self-pay | Admitting: Internal Medicine

## 2015-09-07 ENCOUNTER — Telehealth: Payer: Self-pay | Admitting: Internal Medicine

## 2015-09-07 MED ORDER — AMPHETAMINE-DEXTROAMPHETAMINE 10 MG PO TABS: 10.0000 mg | ORAL_TABLET | Freq: Every day | ORAL | Status: DC

## 2015-09-07 MED ORDER — AMPHETAMINE-DEXTROAMPHETAMINE 20 MG PO TABS: 20.0000 mg | ORAL_TABLET | Freq: Two times a day (BID) | ORAL | Status: DC

## 2015-09-07 MED ORDER — FLUOXETINE HCL 20 MG PO TABS: 20.0000 mg | ORAL_TABLET | Freq: Every day | ORAL | Status: DC

## 2015-09-07 NOTE — Telephone Encounter (Signed)
Pt aware corrected Rx's ready for pickup. Rx's printed and signed.

## 2015-09-07 NOTE — Telephone Encounter (Signed)
Dr. Raliegh Ip, pt said Rx's for Adderall are wrong. Please clarify dosages?

## 2015-09-07 NOTE — Telephone Encounter (Signed)
Pt rxs in correct. Pt needs new rxs generic adderall 20 mg twice a day#60 and generic adderall 10 mg #30. Pt will bring old rx back

## 2015-09-07 NOTE — Telephone Encounter (Signed)
OK for adderal 20  #60 and 10 mg #30

## 2015-09-07 NOTE — Telephone Encounter (Signed)
Pt brought back unused Rx's. Rx's shredded.

## 2015-10-19 ENCOUNTER — Ambulatory Visit: Payer: Self-pay | Admitting: Internal Medicine

## 2015-10-22 ENCOUNTER — Encounter: Payer: Self-pay | Admitting: Internal Medicine

## 2015-10-22 ENCOUNTER — Ambulatory Visit (INDEPENDENT_AMBULATORY_CARE_PROVIDER_SITE_OTHER): Payer: Medicare Other | Admitting: Internal Medicine

## 2015-10-22 VITALS — BP 136/80 | HR 88 | Temp 97.8°F | Resp 20 | Ht 64.0 in | Wt 182.0 lb

## 2015-10-22 DIAGNOSIS — R300 Dysuria: Secondary | ICD-10-CM | POA: Diagnosis not present

## 2015-10-22 DIAGNOSIS — R1030 Lower abdominal pain, unspecified: Secondary | ICD-10-CM | POA: Diagnosis not present

## 2015-10-22 DIAGNOSIS — E785 Hyperlipidemia, unspecified: Secondary | ICD-10-CM

## 2015-10-22 DIAGNOSIS — R7989 Other specified abnormal findings of blood chemistry: Secondary | ICD-10-CM

## 2015-10-22 DIAGNOSIS — I10 Essential (primary) hypertension: Secondary | ICD-10-CM

## 2015-10-22 DIAGNOSIS — F988 Other specified behavioral and emotional disorders with onset usually occurring in childhood and adolescence: Secondary | ICD-10-CM

## 2015-10-22 DIAGNOSIS — F909 Attention-deficit hyperactivity disorder, unspecified type: Secondary | ICD-10-CM | POA: Diagnosis not present

## 2015-10-22 LAB — POC URINALSYSI DIPSTICK (AUTOMATED)
Bilirubin, UA: NEGATIVE
Glucose, UA: NEGATIVE
Nitrite, UA: NEGATIVE
PROTEIN UA: NEGATIVE
SPEC GRAV UA: 1.015
UROBILINOGEN UA: 0.2
pH, UA: 7

## 2015-10-22 MED ORDER — AMPHETAMINE-DEXTROAMPHETAMINE 20 MG PO TABS: ORAL_TABLET | ORAL | Status: DC

## 2015-10-22 MED ORDER — NITROFURANTOIN MONOHYD MACRO 100 MG PO CAPS: 100.0000 mg | ORAL_CAPSULE | Freq: Two times a day (BID) | ORAL | Status: DC

## 2015-10-22 NOTE — Progress Notes (Signed)
Subjective:    Patient ID: [name], female    DOB: 1941-04-13, 75 y.o.   MRN: [id number]  HPI   75 year old patient who has a history of ADHD.  She has osteoarthritis treated hypertension and history of anxiety, depression. She also has a history of recurrent UTIs.  For the past few days she has had some urinary frequency and dysuria.  Otherwise, doing quite well.  Past Medical History  Diagnosis Date  . HYPERLIPIDEMIA NEC/NOS 04/19/2007  . HYPERTENSION 04/07/2007  . ALLERGIC RHINITIS 11/30/2008  . ASTHMA UNSPECIFIED WITH EXACERBATION 02/17/2008  . Esophageal reflux 11/08/2008  . OSTEOARTHRITIS 04/07/2007  . OSTEOPENIA 03/10/2008  . INSOMNIA 08/07/2010  . CAROTID BRUIT, LEFT 04/19/2007  . CLOSED FRACTURE OF METATARSAL BONE 06/04/2009  . ALLERGIC RHINITIS 11/30/2008  . Closed fracture of lateral malleolus 06/04/2009  . GANGLION CYST 04/19/2007  . UTI 07/20/2009    Social History   Social History  . Marital Status: Married    Spouse Name: N/A  . Number of Children: N/A  . Years of Education: N/A   Occupational History  . retired    Social History Main Topics  . Smoking status: Former Smoker -- 1.50 packs/day for 20 years    Types: Cigarettes    Quit date: 09/15/1968  . Smokeless tobacco: Never Used     Comment: smoked 1.5 for  20 years  . Alcohol Use: No  . Drug Use: No  . Sexual Activity: Yes   Other Topics Concern  . Not on file   Social History Narrative   Married for 50 years. She has 1 son that age 71   Retired from [employer]          Past Surgical History  Procedure Laterality Date  . Tubal ligation    . Total hip arthroplasty    . Knuckle replacement      Family History  Problem Relation Age of Onset  . Heart disease Mother     Mother also has significant carotid artery stenosis  . COPD Father   . Alcoholism Father   . Cancer Brother   . Alzheimer's disease Sister   . Thyroid disease Sister     Goiter  . Breast cancer Neg Hx      Allergies  Allergen Reactions  . Cephalexin Other (See Comments)    REACTION IS UNKNOWN    Current Outpatient Prescriptions on File Prior to Visit  Medication Sig Dispense Refill  . amphetamine-dextroamphetamine (ADDERALL) 20 MG tablet Take 1 tablet (20 mg total) by mouth 2 (two) times daily. 60 tablet 0  . amphetamine-dextroamphetamine (ADDERALL) 20 MG tablet Take 1 tablet (20 mg total) by mouth 2 (two) times daily. 60 tablet 0  . amphetamine-dextroamphetamine (ADDERALL) 20 MG tablet Take 1 tablet (20 mg total) by mouth 2 (two) times daily. 60 tablet 0  . aspirin-acetaminophen-caffeine (EXCEDRIN MIGRAINE) [id number] MG per tablet Take 1 tablet by mouth every 6 (six) hours as needed. (Patient taking differently: Take 2 tablets by mouth daily as needed for headache or migraine. ) 30 tablet 0  . buPROPion (WELLBUTRIN XL) 300 MG 24 hr tablet Take 1 tablet (300 mg total) by mouth daily. 30 tablet 6  . calcium carbonate (TUMS - DOSED IN MG ELEMENTAL CALCIUM) 500 MG chewable tablet Chew 1-2 tablets by mouth 2 (two) times daily.     . Cyanocobalamin (B-12 PO) Take 1 tablet by mouth daily.    Marland Kitchen FLUoxetine (PROZAC) 20 MG tablet TAKE ONE  TABLET BY MOUTH ONCE DAILY 90 tablet 0  . glucosamine-chondroitin 500-400 MG tablet Take 1 tablet by mouth 3 (three) times daily.    Marland Kitchen ibuprofen (ADVIL,MOTRIN) 800 MG tablet Take 1 tablet (800 mg total) by mouth 3 (three) times daily. 6 tablet 0  . levothyroxine (SYNTHROID, LEVOTHROID) 88 MCG tablet Take 1 tablet (88 mcg total) by mouth daily. 90 tablet 3  . Melatonin 3 MG TABS Take 3 mg by mouth at bedtime.     . Multiple Vitamin (MULTIVITAMIN) capsule Take 1 capsule by mouth daily.      . ranitidine (ZANTAC) 150 MG tablet TAKE ONE TABLET BY MOUTH TWICE DAILY AS NEEDED FOR  HEARTBURN 180 tablet 1  . valsartan-hydrochlorothiazide (DIOVAN-HCT) 320-25 MG per tablet Take 1 tablet by mouth daily. 90 tablet 1  . VOLTAREN 1 % GEL APPLY   TOPICALLY TO AFFECTED AREA 4  TIMES DAILY (Patient taking differently: APPLY   TOPICALLY TO AFFECTED AREA 4 TIMES DAILY AS NEEDED FOR PAIN) 100 g 11   No current facility-administered medications on file prior to visit.    BP 136/80 mmHg  Pulse 88  Temp(Src) 97.8 F (36.6 C) (Oral)  Resp 20  Ht 5\' 4"  (1.626 m)  Wt 182 lb (82.555 kg)  BMI 31.22 kg/m2     Review of Systems  Constitutional: Negative.   HENT: Negative for congestion, dental problem, hearing loss, rhinorrhea, sinus pressure, sore throat and tinnitus.   Eyes: Negative for pain, discharge and visual disturbance.  Respiratory: Negative for cough and shortness of breath.   Cardiovascular: Negative for chest pain, palpitations and leg swelling.  Gastrointestinal: Negative for nausea, vomiting, abdominal pain, diarrhea, constipation, blood in stool and abdominal distention.  Genitourinary: Positive for dysuria, urgency and frequency. Negative for hematuria, flank pain, vaginal bleeding, vaginal discharge, difficulty urinating, vaginal pain and pelvic pain.  Musculoskeletal: Negative for joint swelling, arthralgias and gait problem.  Skin: Negative for rash.  Neurological: Negative for dizziness, syncope, speech difficulty, weakness, numbness and headaches.  Hematological: Negative for adenopathy.  Psychiatric/Behavioral: Negative for behavioral problems, dysphoric mood and agitation. The patient is not nervous/anxious.        Objective:   Physical Exam  Constitutional: She is oriented to person, place, and time. She appears well-developed and well-nourished.  HENT:  Head: Normocephalic.  Right Ear: External ear normal.  Left Ear: External ear normal.  Mouth/Throat: Oropharynx is clear and moist.  Eyes: Conjunctivae and EOM are normal. Pupils are equal, round, and reactive to light.  Neck: Normal range of motion. Neck supple. No thyromegaly present.  Cardiovascular: Normal rate, regular rhythm, normal heart sounds and intact distal pulses.     Pulmonary/Chest: Effort normal and breath sounds normal.  Abdominal: Soft. Bowel sounds are normal. She exhibits no mass. There is no tenderness.   Mild suprapubic discomfort  Musculoskeletal: Normal range of motion.  Lymphadenopathy:    She has no cervical adenopathy.  Neurological: She is alert and oriented to person, place, and time.  Skin: Skin is warm and dry. No rash noted.  Psychiatric: She has a normal mood and affect. Her behavior is normal.          Assessment & Plan:    Suspected ADHD  Essential hypertension, stable  Early UTI. Will treat with Macrobid    Recheck 3 months

## 2015-10-22 NOTE — Patient Instructions (Signed)
Drink as much fluid as you  can tolerate over the next few days  Take your antibiotic as prescribed until ALL of it is gone, but stop if you develop a rash, swelling, or any side effects of the medication.  Contact our office as soon as possible if  there are side effects of the medication.  Return in 3 months for follow-up

## 2015-10-22 NOTE — Progress Notes (Signed)
Pre visit review using our clinic review tool, if applicable. No additional management support is needed unless otherwise documented below in the visit note. 

## 2015-10-31 ENCOUNTER — Telehealth: Payer: Self-pay

## 2015-10-31 DIAGNOSIS — F418 Other specified anxiety disorders: Secondary | ICD-10-CM

## 2015-10-31 NOTE — Telephone Encounter (Signed)
Pt requesting buPROPion (WELLBUTRIN XL) 300 MG 24 hr tablet Pt last visit 10/22/15 Pt last refill 03/09/15 #30 with 6 refills

## 2015-10-31 NOTE — Telephone Encounter (Signed)
Ok to refill for 6 months 

## 2015-11-01 MED ORDER — BUPROPION HCL ER (XL) 300 MG PO TB24: 300.0000 mg | ORAL_TABLET | Freq: Every day | ORAL | Status: DC

## 2015-11-02 ENCOUNTER — Encounter: Payer: Self-pay | Admitting: Internal Medicine

## 2015-12-13 ENCOUNTER — Encounter: Payer: Self-pay | Admitting: Internal Medicine

## 2015-12-13 DIAGNOSIS — F418 Other specified anxiety disorders: Secondary | ICD-10-CM

## 2015-12-14 MED ORDER — VALSARTAN-HYDROCHLOROTHIAZIDE 320-25 MG PO TABS: 1.0000 | ORAL_TABLET | Freq: Every day | ORAL | Status: DC

## 2015-12-14 MED ORDER — BUPROPION HCL ER (XL) 300 MG PO TB24: 300.0000 mg | ORAL_TABLET | Freq: Every day | ORAL | Status: DC

## 2016-01-09 ENCOUNTER — Other Ambulatory Visit: Payer: Self-pay | Admitting: Internal Medicine

## 2016-01-10 MED ORDER — FLUOXETINE HCL 20 MG PO TABS: 20.0000 mg | ORAL_TABLET | Freq: Every day | ORAL | Status: DC

## 2016-01-18 ENCOUNTER — Encounter: Payer: Self-pay | Admitting: Endocrinology

## 2016-01-21 ENCOUNTER — Ambulatory Visit (INDEPENDENT_AMBULATORY_CARE_PROVIDER_SITE_OTHER): Payer: Medicare Other | Admitting: Internal Medicine

## 2016-01-21 ENCOUNTER — Encounter: Payer: Self-pay | Admitting: Internal Medicine

## 2016-01-21 VITALS — BP 126/78 | HR 81 | Temp 97.9°F | Resp 18 | Ht 64.0 in | Wt 162.0 lb

## 2016-01-21 DIAGNOSIS — F418 Other specified anxiety disorders: Secondary | ICD-10-CM

## 2016-01-21 DIAGNOSIS — F909 Attention-deficit hyperactivity disorder, unspecified type: Secondary | ICD-10-CM | POA: Diagnosis not present

## 2016-01-21 DIAGNOSIS — I10 Essential (primary) hypertension: Secondary | ICD-10-CM | POA: Diagnosis not present

## 2016-01-21 DIAGNOSIS — M150 Primary generalized (osteo)arthritis: Secondary | ICD-10-CM

## 2016-01-21 DIAGNOSIS — M159 Polyosteoarthritis, unspecified: Secondary | ICD-10-CM

## 2016-01-21 DIAGNOSIS — F988 Other specified behavioral and emotional disorders with onset usually occurring in childhood and adolescence: Secondary | ICD-10-CM

## 2016-01-21 MED ORDER — ESTROGENS, CONJUGATED 0.625 MG/GM VA CREA: 1.0000 | TOPICAL_CREAM | Freq: Every day | VAGINAL | Status: DC

## 2016-01-21 MED ORDER — FLUOXETINE HCL 20 MG PO TABS
40.0000 mg | ORAL_TABLET | Freq: Every day | ORAL | Status: DC
Start: 2016-01-21 — End: 2016-04-02

## 2016-01-21 MED ORDER — AMPHETAMINE-DEXTROAMPHETAMINE 20 MG PO TABS: ORAL_TABLET | ORAL | Status: DC

## 2016-01-21 NOTE — Telephone Encounter (Signed)
Dr. Kumar, please advise. 

## 2016-01-21 NOTE — Progress Notes (Signed)
Pre visit review using our clinic review tool, if applicable. No additional management support is needed unless otherwise documented below in the visit note. 

## 2016-01-21 NOTE — Progress Notes (Signed)
Subjective:    Patient ID: [name], female    DOB: May 07, 1941, 75 y.o.   MRN: [id number]  HPI   75 year old patient who has essential hypertension and dyslipidemia.  She is seen today for follow-up.  She has a history also of osteoarthritis.  She has long history of ADD and is in need of medication refills.  She feels that this has been very stable. She also has a long history of anxiety, depression.  She feels that her depression is slightly worsened and she wishes to consider a increase in Prozac.  His has been helpful in the past.  She also has had a history of frequent UTIs and is asking about a refill of Premarin vaginal cream.  Past Medical History  Diagnosis Date  . HYPERLIPIDEMIA NEC/NOS 04/19/2007  . HYPERTENSION 04/07/2007  . ALLERGIC RHINITIS 11/30/2008  . ASTHMA UNSPECIFIED WITH EXACERBATION 02/17/2008  . Esophageal reflux 11/08/2008  . OSTEOARTHRITIS 04/07/2007  . OSTEOPENIA 03/10/2008  . INSOMNIA 08/07/2010  . CAROTID BRUIT, LEFT 04/19/2007  . CLOSED FRACTURE OF METATARSAL BONE 06/04/2009  . ALLERGIC RHINITIS 11/30/2008  . Closed fracture of lateral malleolus 06/04/2009  . GANGLION CYST 04/19/2007  . UTI 07/20/2009     Social History   Social History  . Marital Status: Married    Spouse Name: N/A  . Number of Children: N/A  . Years of Education: N/A   Occupational History  . retired    Social History Main Topics  . Smoking status: Former Smoker -- 1.50 packs/day for 20 years    Types: Cigarettes    Quit date: 09/15/1968  . Smokeless tobacco: Never Used     Comment: smoked 1.5 for  20 years  . Alcohol Use: No  . Drug Use: No  . Sexual Activity: Yes   Other Topics Concern  . Not on file   Social History Narrative   Married for 50 years. She has 1 son that age 35   Retired from Environmental [employer]          Past Surgical History  Procedure Laterality Date  . Tubal ligation    . Total hip arthroplasty    . Knuckle replacement      Family  History  Problem Relation Age of Onset  . Heart disease Mother     Mother also has significant carotid artery stenosis  . COPD Father   . Alcoholism Father   . Cancer Brother   . Alzheimer's disease Sister   . Thyroid disease Sister     Goiter  . Breast cancer Neg Hx     Allergies  Allergen Reactions  . Cephalexin Other (See Comments)    REACTION IS UNKNOWN    Current Outpatient Prescriptions on File Prior to Visit  Medication Sig Dispense Refill  . aspirin-acetaminophen-caffeine (EXCEDRIN MIGRAINE) 250-250-65 MG per tablet Take 1 tablet by mouth every 6 (six) hours as needed. (Patient taking differently: Take 2 tablets by mouth daily as needed for headache or migraine. ) 30 tablet 0  . buPROPion (WELLBUTRIN XL) 300 MG 24 hr tablet Take 1 tablet (300 mg total) by mouth daily. 90 tablet 1  . calcium carbonate (TUMS - DOSED IN MG ELEMENTAL CALCIUM) 500 MG chewable tablet Chew 1-2 tablets by mouth 2 (two) times daily.     Marland Kitchen glucosamine-chondroitin 500-400 MG tablet Take 1 tablet by mouth 3 (three) times daily.    Marland Kitchen ibuprofen (ADVIL,MOTRIN) 800 MG tablet Take 1 tablet (800 mg total) by mouth  3 (three) times daily. 6 tablet 0  . levothyroxine (SYNTHROID, LEVOTHROID) 88 MCG tablet Take 1 tablet (88 mcg total) by mouth daily. 90 tablet 3  . Multiple Vitamin (MULTIVITAMIN) capsule Take 1 capsule by mouth daily.      . ranitidine (ZANTAC) 150 MG tablet TAKE ONE TABLET BY MOUTH TWICE DAILY AS NEEDED FOR  HEARTBURN 180 tablet 1  . valsartan-hydrochlorothiazide (DIOVAN-HCT) 320-25 MG tablet Take 1 tablet by mouth daily. 90 tablet 1  . VOLTAREN 1 % GEL APPLY   TOPICALLY TO AFFECTED AREA 4 TIMES DAILY (Patient taking differently: APPLY   TOPICALLY TO AFFECTED AREA 4 TIMES DAILY AS NEEDED FOR PAIN) 100 g 11  . Cyanocobalamin (B-12 PO) Take 1 tablet by mouth daily. Reported on 01/21/2016    . Melatonin 3 MG TABS Take 3 mg by mouth at bedtime. Reported on 01/21/2016    . nitrofurantoin,  macrocrystal-monohydrate, (MACROBID) 100 MG capsule Take 1 capsule (100 mg total) by mouth 2 (two) times daily. (Patient not taking: Reported on 01/21/2016) 14 capsule 0   No current facility-administered medications on file prior to visit.    BP 126/78 mmHg  Pulse 81  Temp(Src) 97.9 F (36.6 C) (Oral)  Resp 18  Ht 5\' 4"  (1.626 m)  Wt 162 lb (73.483 kg)  BMI 27.79 kg/m2  SpO2 92%     Review of Systems  Constitutional: Negative.   HENT: Negative for congestion, dental problem, hearing loss, rhinorrhea, sinus pressure, sore throat and tinnitus.   Eyes: Negative for pain, discharge and visual disturbance.  Respiratory: Negative for cough and shortness of breath.   Cardiovascular: Negative for chest pain, palpitations and leg swelling.  Gastrointestinal: Negative for nausea, vomiting, abdominal pain, diarrhea, constipation, blood in stool and abdominal distention.  Genitourinary: Positive for vaginal pain. Negative for dysuria, urgency, frequency, hematuria, flank pain, vaginal bleeding, vaginal discharge, difficulty urinating and pelvic pain.  Musculoskeletal: Positive for arthralgias. Negative for joint swelling and gait problem.  Skin: Negative for rash.  Neurological: Negative for dizziness, syncope, speech difficulty, weakness, numbness and headaches.  Hematological: Negative for adenopathy.  Psychiatric/Behavioral: Positive for dysphoric mood. Negative for behavioral problems and agitation. The patient is nervous/anxious.        Objective:   Physical Exam  Constitutional: She is oriented to person, place, and time. She appears well-developed and well-nourished.  HENT:  Head: Normocephalic.  Right Ear: External ear normal.  Left Ear: External ear normal.  Mouth/Throat: Oropharynx is clear and moist.  Eyes: Conjunctivae and EOM are normal. Pupils are equal, round, and reactive to light.  Neck: Normal range of motion. Neck supple. No thyromegaly present.  Cardiovascular:  Normal rate, regular rhythm, normal heart sounds and intact distal pulses.   Pulmonary/Chest: Effort normal and breath sounds normal.  Abdominal: Soft. Bowel sounds are normal. She exhibits no mass. There is no tenderness.  Musculoskeletal: Normal range of motion.  Lymphadenopathy:    She has no cervical adenopathy.  Neurological: She is alert and oriented to person, place, and time.  Skin: Skin is warm and dry. No rash noted.  Psychiatric: She has a normal mood and affect. Her behavior is normal.          Assessment & Plan:   Postmenopausal syndrome with urogenital atrophy.  Premarin vaginal cream will be refilled.  Will take daily for one week and then decrease to 2 times weekly ADHD.  Medications refilled Anxiety, depression.  We'll increase Prozac at least short-term. Osteoarthritis Hypertension, controlled  CPX  3-4 months

## 2016-01-21 NOTE — Patient Instructions (Signed)
Limit your sodium (Salt) intake    It is important that you exercise regularly, at least 20 minutes 3 to 4 times per week.  If you develop chest pain or shortness of breath seek  medical attention.  Return in 4 months for follow-up   

## 2016-01-23 ENCOUNTER — Other Ambulatory Visit: Payer: Self-pay | Admitting: *Deleted

## 2016-01-23 ENCOUNTER — Telehealth: Payer: Self-pay | Admitting: Endocrinology

## 2016-01-23 ENCOUNTER — Telehealth: Payer: Self-pay | Admitting: Internal Medicine

## 2016-01-23 ENCOUNTER — Encounter: Payer: Self-pay | Admitting: *Deleted

## 2016-01-23 MED ORDER — ESTROGENS, CONJUGATED 0.625 MG/GM VA CREA: 1.0000 | TOPICAL_CREAM | Freq: Every day | VAGINAL | Status: DC

## 2016-01-23 NOTE — Telephone Encounter (Signed)
Called [facility] and spoke to [place], told her directions for Premarin Vaginal cream should be one applicator full daily x 1 week and then twice a week. Dominique verbalized understanding.

## 2016-01-23 NOTE — Telephone Encounter (Signed)
PT rescheduled appt for 5/25 she wants her lab orders sent to [facility] in [place], [place] # [phone].  Also requests a notification on MyChart when the orders are sent so she can set up her appt with them

## 2016-01-23 NOTE — Telephone Encounter (Signed)
Lab orders faxed, sent patient a message through mychart.

## 2016-01-23 NOTE — Telephone Encounter (Signed)
Need clarification for the premarin cream.  Two sets of directions was sent to [facility] in [place].

## 2016-02-02 ENCOUNTER — Other Ambulatory Visit: Payer: Self-pay | Admitting: Endocrinology

## 2016-02-02 DIAGNOSIS — E0520 Thyrotoxicosis with toxic multinodular goiter without thyrotoxic crisis or storm: Secondary | ICD-10-CM | POA: Diagnosis not present

## 2016-02-02 LAB — T4, FREE: FREE T4: 1.1 ng/dL (ref 0.8–1.8)

## 2016-02-02 LAB — TSH: TSH: 5.37 m[IU]/L — AB

## 2016-02-07 ENCOUNTER — Encounter: Payer: Self-pay | Admitting: Endocrinology

## 2016-02-07 ENCOUNTER — Ambulatory Visit (INDEPENDENT_AMBULATORY_CARE_PROVIDER_SITE_OTHER): Payer: Medicare Other | Admitting: Endocrinology

## 2016-02-07 VITALS — BP 134/92 | HR 82 | Temp 98.1°F | Resp 14 | Ht 64.0 in | Wt 167.6 lb

## 2016-02-07 DIAGNOSIS — E890 Postprocedural hypothyroidism: Secondary | ICD-10-CM | POA: Diagnosis not present

## 2016-02-07 MED ORDER — LEVOTHYROXINE SODIUM 100 MCG PO TABS: 100.0000 ug | ORAL_TABLET | Freq: Every day | ORAL | Status: DC

## 2016-02-07 NOTE — Progress Notes (Signed)
Patient ID: [name], female   DOB: 1941/01/24, 75 y.o.   MRN: [id number]    Reason for Appointment:  Follow-up of thyroid    History of Present Illness:   Previous history: She had a low TSH level as far back as 7 years ago She had symptoms of feeling hot and sweaty with physical activity, some fatigue and palpitations She has had long history of anxiety and depression   Because of her symptoms and TSH in the hyperthyroid range as well as uptake of 34% she was given I-131 treatment with 30 mCi on 09/27/14. Subsequently she was subjectively better When TSH  was  as high as 15.9 she was started on 75 g levothyroxine in 12/2014  Recent history:  Because of her continued symptoms of fatigue her dose was increased to 88 g in 04/2015 when her TSH was upper normal at 4.41 However she tends to have fatigue for the reasons Although her TSH was somewhat improved and she was feeling a little better she is now complaining of fatigue again especially for the last 3-4 weeks She is very compliant with taking her levothyroxine before breakfast consistently in the morning She also has lost a lot of weight with following a low carbohydrate diet She also is having some chronic insomnia  Her TSH has now gone higher with her 88 g dose   Wt Readings from Last 3 Encounters:  02/07/16 167 lb 9.6 oz (76.023 kg)  01/21/16 162 lb (73.483 kg)  10/22/15 182 lb (82.555 kg)    Lab Results  Component Value Date   TSH 5.37* 02/02/2016   TSH 3.624 08/08/2015   TSH 4.410 05/08/2015   FREET4 1.1 02/02/2016   FREET4 1.31 08/08/2015   FREET4 1.10 05/08/2015         Medication List       This list is accurate as of: 02/07/16  2:04 PM.  Always use your most recent med list.               amphetamine-dextroamphetamine 20 MG tablet  Commonly known as:  ADDERALL  TAKE 30 MG BY MOUTH IN AM AND 20 MG IN PM     aspirin-acetaminophen-caffeine 250-250-65 MG tablet  Commonly known as:   EXCEDRIN MIGRAINE  Take 1 tablet by mouth every 6 (six) hours as needed.     B-12 PO  Take 1 tablet by mouth daily. Reported on 01/21/2016     buPROPion 300 MG 24 hr tablet  Commonly known as:  WELLBUTRIN XL  Take 1 tablet (300 mg total) by mouth daily.     calcium carbonate 500 MG chewable tablet  Commonly known as:  TUMS - dosed in mg elemental calcium  Chew 1-2 tablets by mouth 2 (two) times daily.     conjugated estrogens vaginal cream  Commonly known as:  PREMARIN  Place 1 Applicatorful vaginally daily. Apply twice per week     FLUoxetine 20 MG tablet  Commonly known as:  PROZAC  Take 2 tablets (40 mg total) by mouth daily.     glucosamine-chondroitin 500-400 MG tablet  Take 1 tablet by mouth 3 (three) times daily.     levothyroxine 100 MCG tablet  Commonly known as:  SYNTHROID, LEVOTHROID  Take 1 tablet (100 mcg total) by mouth daily.     Melatonin 3 MG Tabs  Take 3 mg by mouth at bedtime. Reported on 01/21/2016     multivitamin capsule  Take 1 capsule by mouth  daily.     nitrofurantoin (macrocrystal-monohydrate) 100 MG capsule  Commonly known as:  MACROBID  Take 1 capsule (100 mg total) by mouth 2 (two) times daily.     ranitidine 150 MG tablet  Commonly known as:  ZANTAC  TAKE ONE TABLET BY MOUTH TWICE DAILY AS NEEDED FOR  HEARTBURN     valsartan-hydrochlorothiazide 320-25 MG tablet  Commonly known as:  DIOVAN-HCT  Take 1 tablet by mouth daily.     VOLTAREN 1 % Gel  Generic drug:  diclofenac sodium  APPLY   TOPICALLY TO AFFECTED AREA 4 TIMES DAILY        Allergies:  Allergies  Allergen Reactions  . Cephalexin Other (See Comments)    REACTION IS UNKNOWN    Past Medical History  Diagnosis Date  . HYPERLIPIDEMIA NEC/NOS 04/19/2007  . HYPERTENSION 04/07/2007  . ALLERGIC RHINITIS 11/30/2008  . ASTHMA UNSPECIFIED WITH EXACERBATION 02/17/2008  . Esophageal reflux 11/08/2008  . OSTEOARTHRITIS 04/07/2007  . OSTEOPENIA 03/10/2008  . INSOMNIA 08/07/2010  .  CAROTID BRUIT, LEFT 04/19/2007  . CLOSED FRACTURE OF METATARSAL BONE 06/04/2009  . ALLERGIC RHINITIS 11/30/2008  . Closed fracture of lateral malleolus 06/04/2009  . GANGLION CYST 04/19/2007  . UTI 07/20/2009    Past Surgical History  Procedure Laterality Date  . Tubal ligation    . Total hip arthroplasty    . Knuckle replacement      Family History  Problem Relation Age of Onset  . Heart disease Mother     Mother also has significant carotid artery stenosis  . COPD Father   . Alcoholism Father   . Cancer Brother   . Alzheimer's disease Sister   . Thyroid disease Sister     Goiter  . Breast cancer Neg Hx     Social History:  reports that she quit smoking about 47 years ago. Her smoking use included Cigarettes. She has a 30 pack-year smoking history. She has never used smokeless tobacco. She reports that she does not drink alcohol or use illicit drugs.   Review of Systems:   Review of Systems  Psychiatric/Behavioral: Positive for depression.      Has complaints of late insomnia, chronic   BP Readings from Last 3 Encounters:  02/07/16 134/92  01/21/16 126/78  10/22/15 136/80    Examination:   BP 134/92 mmHg  Pulse 82  Temp(Src) 98.1 F (36.7 C)  Resp 14  Ht 5\' 4"  (1.626 m)  Wt 167 lb 9.6 oz (76.023 kg)  BMI 28.75 kg/m2  SpO2 98%     Assessment/Plan:    She has been onLevothyroxine supplementation for post ablative hypothyroidism, baseline TSH 16  Now with taking 88 g of levothyroxine daily she is having more fatigue and also her TSH is trending higher again above normal She is compliant with her medication daily  Although she has lost weight with her diet she still appears to be requiring a higher dose Will change her dose to 100 g and see her back in 2 months for follow-up  Discussed that she may have other reasons to have continued fatigue and will need to follow-up with PCP   There are no Patient Instructions on file for this  visit.     [facility] 02/07/2016

## 2016-02-13 ENCOUNTER — Ambulatory Visit: Payer: Self-pay | Admitting: Endocrinology

## 2016-03-05 ENCOUNTER — Encounter: Payer: Self-pay | Admitting: Internal Medicine

## 2016-03-05 ENCOUNTER — Ambulatory Visit (INDEPENDENT_AMBULATORY_CARE_PROVIDER_SITE_OTHER): Payer: Medicare Other | Admitting: Internal Medicine

## 2016-03-05 VITALS — BP 140/80 | HR 52 | Temp 98.4°F | Resp 20 | Ht 64.0 in | Wt 168.0 lb

## 2016-03-05 DIAGNOSIS — I10 Essential (primary) hypertension: Secondary | ICD-10-CM | POA: Diagnosis not present

## 2016-03-05 DIAGNOSIS — R8290 Unspecified abnormal findings in urine: Secondary | ICD-10-CM | POA: Diagnosis not present

## 2016-03-05 LAB — POC URINALSYSI DIPSTICK (AUTOMATED)
BILIRUBIN UA: NEGATIVE
Glucose, UA: NEGATIVE
Ketones, UA: NEGATIVE
Nitrite, UA: POSITIVE
PH UA: 7.5
PROTEIN UA: NEGATIVE
Spec Grav, UA: 1.015
Urobilinogen, UA: 0.2

## 2016-03-05 MED ORDER — SULFAMETHOXAZOLE-TRIMETHOPRIM 800-160 MG PO TABS: 1.0000 | ORAL_TABLET | Freq: Two times a day (BID) | ORAL | Status: DC

## 2016-03-05 MED ORDER — ESTROGENS, CONJUGATED 0.625 MG/GM VA CREA: 1.0000 | TOPICAL_CREAM | Freq: Every day | VAGINAL | Status: DC

## 2016-03-05 NOTE — Patient Instructions (Signed)
Drink as much fluid as you  can tolerate over the next few days  Call or return to clinic prn if these symptoms worsen or fail to improve as anticipated.  

## 2016-03-05 NOTE — Progress Notes (Signed)
Subjective:    Patient ID: [name], female    DOB: 08-Nov-1940, 75 y.o.   MRN: [id number]  HPI  75 year old patient who presents with a three-day history of mild nonspecific lower abdominal aching.  Over this period time.  She states that her urine has developed a stronger odor.  She was treated for a UTI.  Early last month.  She has a prior history of urosepsis, including ICU care and is quite concerned about early symptoms.  Denies any urinary frequency, dysuria or fever  Past Medical History  Diagnosis Date  . HYPERLIPIDEMIA NEC/NOS 04/19/2007  . HYPERTENSION 04/07/2007  . ALLERGIC RHINITIS 11/30/2008  . ASTHMA UNSPECIFIED WITH EXACERBATION 02/17/2008  . Esophageal reflux 11/08/2008  . OSTEOARTHRITIS 04/07/2007  . OSTEOPENIA 03/10/2008  . INSOMNIA 08/07/2010  . CAROTID BRUIT, LEFT 04/19/2007  . CLOSED FRACTURE OF METATARSAL BONE 06/04/2009  . ALLERGIC RHINITIS 11/30/2008  . Closed fracture of lateral malleolus 06/04/2009  . GANGLION CYST 04/19/2007  . UTI 07/20/2009     Social History   Social History  . Marital Status: Married    Spouse Name: N/A  . Number of Children: N/A  . Years of Education: N/A   Occupational History  . retired    Social History Main Topics  . Smoking status: Former Smoker -- 1.50 packs/day for 20 years    Types: Cigarettes    Quit date: 09/15/1968  . Smokeless tobacco: Never Used     Comment: smoked 1.5 for  20 years  . Alcohol Use: No  . Drug Use: No  . Sexual Activity: Yes   Other Topics Concern  . Not on file   Social History Narrative   Married for 50 years. She has 1 son that age 20   Retired from Environmental [employer]          Past Surgical History  Procedure Laterality Date  . Tubal ligation    . Total hip arthroplasty    . Knuckle replacement      Family History  Problem Relation Age of Onset  . Heart disease Mother     Mother also has significant carotid artery stenosis  . COPD Father   . Alcoholism Father   .  Cancer Brother   . Alzheimer's disease Sister   . Thyroid disease Sister     Goiter  . Breast cancer Neg Hx     Allergies  Allergen Reactions  . Cephalexin Other (See Comments)    REACTION IS UNKNOWN    Current Outpatient Prescriptions on File Prior to Visit  Medication Sig Dispense Refill  . amphetamine-dextroamphetamine (ADDERALL) 20 MG tablet TAKE 30 MG BY MOUTH IN AM AND 20 MG IN PM 75 tablet 0  . aspirin-acetaminophen-caffeine (EXCEDRIN MIGRAINE) [id number] MG per tablet Take 1 tablet by mouth every 6 (six) hours as needed. (Patient taking differently: Take 2 tablets by mouth daily as needed for headache or migraine. ) 30 tablet 0  . buPROPion (WELLBUTRIN XL) 300 MG 24 hr tablet Take 1 tablet (300 mg total) by mouth daily. 90 tablet 1  . calcium carbonate (TUMS - DOSED IN MG ELEMENTAL CALCIUM) 500 MG chewable tablet Chew 1-2 tablets by mouth 2 (two) times daily.     . Cyanocobalamin (B-12 PO) Take 1 tablet by mouth daily. Reported on 01/21/2016    . FLUoxetine (PROZAC) 20 MG tablet Take 2 tablets (40 mg total) by mouth daily. 180 tablet 3  . glucosamine-chondroitin 500-400 MG tablet Take 1 tablet by  mouth 3 (three) times daily.    Marland Kitchen levothyroxine (SYNTHROID, LEVOTHROID) 100 MCG tablet Take 1 tablet (100 mcg total) by mouth daily. 90 tablet 3  . Melatonin 3 MG TABS Take 3 mg by mouth at bedtime. Reported on 01/21/2016    . Multiple Vitamin (MULTIVITAMIN) capsule Take 1 capsule by mouth daily.      . nitrofurantoin, macrocrystal-monohydrate, (MACROBID) 100 MG capsule Take 1 capsule (100 mg total) by mouth 2 (two) times daily. 14 capsule 0  . ranitidine (ZANTAC) 150 MG tablet TAKE ONE TABLET BY MOUTH TWICE DAILY AS NEEDED FOR  HEARTBURN 180 tablet 1  . valsartan-hydrochlorothiazide (DIOVAN-HCT) 320-25 MG tablet Take 1 tablet by mouth daily. 90 tablet 1  . VOLTAREN 1 % GEL APPLY   TOPICALLY TO AFFECTED AREA 4 TIMES DAILY (Patient taking differently: APPLY   TOPICALLY TO AFFECTED AREA 4 TIMES  DAILY AS NEEDED FOR PAIN) 100 g 11   No current facility-administered medications on file prior to visit.    BP 140/80 mmHg  Pulse 52  Temp(Src) 98.4 F (36.9 C) (Oral)  Resp 20  Ht 5\' 4"  (1.626 m)  Wt 168 lb (76.204 kg)  BMI 28.82 kg/m2     Review of Systems  Constitutional: Negative.   HENT: Negative for congestion, dental problem, hearing loss, rhinorrhea, sinus pressure, sore throat and tinnitus.   Eyes: Negative for pain, discharge and visual disturbance.  Respiratory: Negative for cough and shortness of breath.   Cardiovascular: Negative for chest pain, palpitations and leg swelling.  Gastrointestinal: Positive for abdominal pain. Negative for nausea, vomiting, diarrhea, constipation, blood in stool and abdominal distention.  Genitourinary: Negative for dysuria, urgency, frequency, hematuria, flank pain, vaginal bleeding, vaginal discharge, difficulty urinating, vaginal pain and pelvic pain.  Musculoskeletal: Negative for joint swelling, arthralgias and gait problem.  Skin: Negative for rash.  Neurological: Negative for dizziness, syncope, speech difficulty, weakness, numbness and headaches.  Hematological: Negative for adenopathy.  Psychiatric/Behavioral: Negative for behavioral problems, dysphoric mood and agitation. The patient is not nervous/anxious.        Objective:   Physical Exam  Constitutional: She is oriented to person, place, and time. She appears well-developed and well-nourished.  HENT:  Head: Normocephalic.  Right Ear: External ear normal.  Left Ear: External ear normal.  Mouth/Throat: Oropharynx is clear and moist.  Eyes: Conjunctivae and EOM are normal. Pupils are equal, round, and reactive to light.  Neck: Normal range of motion. Neck supple. No thyromegaly present.  Cardiovascular: Normal rate, regular rhythm, normal heart sounds and intact distal pulses.   Pulmonary/Chest: Effort normal and breath sounds normal.  Abdominal: Soft. Bowel sounds are  normal. She exhibits no distension and no mass. There is no tenderness. There is no rebound and no guarding.  Musculoskeletal: Normal range of motion.  Lymphadenopathy:    She has no cervical adenopathy.  Neurological: She is alert and oriented to person, place, and time.  Skin: Skin is warm and dry. No rash noted.  Psychiatric: She has a normal mood and affect. Her behavior is normal.          Assessment & Plan:   Rule out UTI.  Urinalysis reviewed and revealed trace pyuria, positive nitrate and plus 2, hematuria.  Symptoms are fairly nonspecific.  Options discussed with patient will force fluids.  Over the next couple of days and observe.  The patient was given a prescription for an antibiotic to use if symptoms intensify Essential hypertension, stable  CPX as scheduled.Nyoka Cowden, MD

## 2016-03-05 NOTE — Progress Notes (Signed)
Pre visit review using our clinic review tool, if applicable. No additional management support is needed unless otherwise documented below in the visit note. 

## 2016-03-20 ENCOUNTER — Other Ambulatory Visit: Payer: Self-pay | Admitting: Internal Medicine

## 2016-03-28 ENCOUNTER — Telehealth: Payer: Self-pay

## 2016-03-28 NOTE — Telephone Encounter (Signed)
Spoke with patient and rescheduled her for 04-02-16 at 10:45

## 2016-03-29 ENCOUNTER — Encounter: Payer: Self-pay | Admitting: Endocrinology

## 2016-04-02 ENCOUNTER — Encounter: Payer: Self-pay | Admitting: Internal Medicine

## 2016-04-02 ENCOUNTER — Ambulatory Visit (INDEPENDENT_AMBULATORY_CARE_PROVIDER_SITE_OTHER): Payer: Medicare Other | Admitting: Internal Medicine

## 2016-04-02 VITALS — BP 138/90 | HR 114 | Temp 97.7°F | Ht 64.0 in | Wt 166.0 lb

## 2016-04-02 DIAGNOSIS — I10 Essential (primary) hypertension: Secondary | ICD-10-CM

## 2016-04-02 DIAGNOSIS — F418 Other specified anxiety disorders: Secondary | ICD-10-CM

## 2016-04-02 DIAGNOSIS — F909 Attention-deficit hyperactivity disorder, unspecified type: Secondary | ICD-10-CM | POA: Diagnosis not present

## 2016-04-02 DIAGNOSIS — F988 Other specified behavioral and emotional disorders with onset usually occurring in childhood and adolescence: Secondary | ICD-10-CM

## 2016-04-02 NOTE — Progress Notes (Signed)
Pre visit review using our clinic review tool, if applicable. No additional management support is needed unless otherwise documented below in the visit note. 

## 2016-04-02 NOTE — Progress Notes (Signed)
Subjective:    Patient ID: [name], female    DOB: [birth year], 75 y.o.   MRN: [id number]  HPI  75 year old patient who is seen today in follow-up.  Prior physicians have included Dr. Arnoldo Morale and Dr. Shawna Orleans.  She has been treated for ADHD and has also been evaluated by behavioral health who felt she was more anxious and depressed, rather than having clear ADHD.  This was discussed again at length.  She has been given other ADHD screening questionnaires which have been positive.  She does quite well on the Adderall and states that it does help her staying focused and thinking much more clearly  Past Medical History  Diagnosis Date  . HYPERLIPIDEMIA NEC/NOS 04/19/2007  . HYPERTENSION 04/07/2007  . ALLERGIC RHINITIS 11/30/2008  . ASTHMA UNSPECIFIED WITH EXACERBATION 02/17/2008  . Esophageal reflux 11/08/2008  . OSTEOARTHRITIS 04/07/2007  . OSTEOPENIA 03/10/2008  . INSOMNIA 08/07/2010  . CAROTID BRUIT, LEFT 04/19/2007  . CLOSED FRACTURE OF METATARSAL BONE 06/04/2009  . ALLERGIC RHINITIS 11/30/2008  . Closed fracture of lateral malleolus 06/04/2009  . GANGLION CYST 04/19/2007  . UTI 07/20/2009     Social History   Social History  . Marital Status: Married    Spouse Name: N/A  . Number of Children: N/A  . Years of Education: N/A   Occupational History  . retired    Social History Main Topics  . Smoking status: Former Smoker -- 1.50 packs/day for 20 years    Types: Cigarettes    Quit date: 09/15/1968  . Smokeless tobacco: Never Used     Comment: smoked 1.5 for  20 years  . Alcohol Use: No  . Drug Use: No  . Sexual Activity: Yes   Other Topics Concern  . Not on file   Social History Narrative   Married for 50 years. She has 1 son that age 83   Retired from [employer]          Past Surgical History  Procedure Laterality Date  . Tubal ligation    . Total hip arthroplasty    . Knuckle replacement      Family History  Problem Relation Age of Onset  .  Heart disease Mother     Mother also has significant carotid artery stenosis  . COPD Father   . Alcoholism Father   . Cancer Brother   . Alzheimer's disease Sister   . Thyroid disease Sister     Goiter  . Breast cancer Neg Hx     Allergies  Allergen Reactions  . Cephalexin Other (See Comments)    REACTION IS UNKNOWN    Current Outpatient Prescriptions on File Prior to Visit  Medication Sig Dispense Refill  . amphetamine-dextroamphetamine (ADDERALL) 20 MG tablet TAKE 30 MG BY MOUTH IN AM AND 20 MG IN PM 75 tablet 0  . aspirin-acetaminophen-caffeine (EXCEDRIN MIGRAINE) [id number] MG per tablet Take 1 tablet by mouth every 6 (six) hours as needed. (Patient taking differently: Take 2 tablets by mouth daily as needed for headache or migraine. ) 30 tablet 0  . buPROPion (WELLBUTRIN XL) 300 MG 24 hr tablet Take 1 tablet (300 mg total) by mouth daily. 90 tablet 1  . calcium carbonate (TUMS - DOSED IN MG ELEMENTAL CALCIUM) 500 MG chewable tablet Chew 1-2 tablets by mouth 2 (two) times daily.     Marland Kitchen conjugated estrogens (PREMARIN) vaginal cream Place 1 Applicatorful vaginally daily. Apply twice per week 42.5 g 12  . Cyanocobalamin (B-12 PO)  Take 1 tablet by mouth daily. Reported on 01/21/2016    . FLUoxetine (PROZAC) 20 MG capsule TAKE ONE CAPSULE BY MOUTH ONCE DAILY **PLEASE  ESTABLISH  WITH  A  NEW  PROVIDER  FOR  FURTHER  REFILLS** 90 capsule 0  . glucosamine-chondroitin 500-400 MG tablet Take 1 tablet by mouth 3 (three) times daily.    Marland Kitchen levothyroxine (SYNTHROID, LEVOTHROID) 100 MCG tablet Take 1 tablet (100 mcg total) by mouth daily. 90 tablet 3  . Melatonin 3 MG TABS Take 3 mg by mouth at bedtime. Reported on 01/21/2016    . Multiple Vitamin (MULTIVITAMIN) capsule Take 1 capsule by mouth daily.      . nitrofurantoin, macrocrystal-monohydrate, (MACROBID) 100 MG capsule Take 1 capsule (100 mg total) by mouth 2 (two) times daily. 14 capsule 0  . ranitidine (ZANTAC) 150 MG tablet TAKE ONE TABLET BY  MOUTH TWICE DAILY AS NEEDED FOR  HEARTBURN 180 tablet 1  . valsartan-hydrochlorothiazide (DIOVAN-HCT) 320-25 MG tablet Take 1 tablet by mouth daily. 90 tablet 1  . VOLTAREN 1 % GEL APPLY   TOPICALLY TO AFFECTED AREA 4 TIMES DAILY (Patient taking differently: APPLY   TOPICALLY TO AFFECTED AREA 4 TIMES DAILY AS NEEDED FOR PAIN) 100 g 11   No current facility-administered medications on file prior to visit.    BP 138/90 mmHg  Pulse 114  Temp(Src) 97.7 F (36.5 C) (Oral)  Ht 5\' 4"  (1.626 m)  Wt 166 lb (75.297 kg)  BMI 28.48 kg/m2  SpO2 94%     Review of Systems  Constitutional: Negative.   HENT: Negative for congestion, dental problem, hearing loss, rhinorrhea, sinus pressure, sore throat and tinnitus.   Eyes: Negative for pain, discharge and visual disturbance.  Respiratory: Negative for cough and shortness of breath.   Cardiovascular: Negative for chest pain, palpitations and leg swelling.  Gastrointestinal: Negative for nausea, vomiting, abdominal pain, diarrhea, constipation, blood in stool and abdominal distention.  Genitourinary: Negative for dysuria, urgency, frequency, hematuria, flank pain, vaginal bleeding, vaginal discharge, difficulty urinating, vaginal pain and pelvic pain.  Musculoskeletal: Negative for joint swelling, arthralgias and gait problem.  Skin: Negative for rash.  Neurological: Negative for dizziness, syncope, speech difficulty, weakness, numbness and headaches.  Hematological: Negative for adenopathy.  Psychiatric/Behavioral: Positive for behavioral problems, dysphoric mood and decreased concentration. Negative for agitation. The patient is not nervous/anxious.        Objective:   Physical Exam  Constitutional: She is oriented to person, place, and time. She appears well-developed and well-nourished.  HENT:  Head: Normocephalic.  Right Ear: External ear normal.  Left Ear: External ear normal.  Mouth/Throat: Oropharynx is clear and moist.  Eyes:  Conjunctivae and EOM are normal. Pupils are equal, round, and reactive to light.  Neck: Normal range of motion. Neck supple. No thyromegaly present.  Cardiovascular: Normal rate, regular rhythm, normal heart sounds and intact distal pulses.   Pulmonary/Chest: Effort normal and breath sounds normal.  Abdominal: Soft. Bowel sounds are normal. She exhibits no mass. There is no tenderness.  Musculoskeletal: Normal range of motion.  Lymphadenopathy:    She has no cervical adenopathy.  Neurological: She is alert and oriented to person, place, and time.  Skin: Skin is warm and dry. No rash noted.  Psychiatric: She has a normal mood and affect. Her behavior is normal.          Assessment & Plan:   Anxiety, depression, stable.  We'll continue present.  Dual therapy History of ADHD.  This  diagnosis is in question.  Patient does much better on stimulant therapy.  We'll continue at this point Hypothyroidism Essential hypertension, well-controlled.  Repeat blood pressure 120/80  Nyoka Cowden, MD

## 2016-04-05 ENCOUNTER — Other Ambulatory Visit: Payer: Self-pay | Admitting: Endocrinology

## 2016-04-05 DIAGNOSIS — E890 Postprocedural hypothyroidism: Secondary | ICD-10-CM | POA: Diagnosis not present

## 2016-04-05 LAB — T4, FREE: Free T4: 1.2 ng/dL (ref 0.8–1.8)

## 2016-04-05 LAB — TSH: TSH: 1.84 mIU/L

## 2016-04-09 ENCOUNTER — Ambulatory Visit (INDEPENDENT_AMBULATORY_CARE_PROVIDER_SITE_OTHER): Payer: Medicare Other | Admitting: Endocrinology

## 2016-04-09 VITALS — BP 128/80 | HR 89 | Wt 172.0 lb

## 2016-04-09 DIAGNOSIS — E890 Postprocedural hypothyroidism: Secondary | ICD-10-CM

## 2016-04-09 NOTE — Progress Notes (Signed)
Patient ID: [name], female   DOB: Oct 19, 1940, 75 y.o.   MRN: [id number]    Reason for Appointment:  Follow-up of thyroid    History of Present Illness:   Previous history: She had a low TSH level as far back as 7 years ago She had symptoms of feeling hot and sweaty with physical activity, some fatigue and palpitations She has had long history of anxiety and depression   Because of her symptoms and TSH in the hyperthyroid range as well as uptake of 34% she was given I-131 treatment with 30 mCi on 09/27/14. Subsequently she was subjectively better When TSH  was  as high as 15.9 she was started on 75 g levothyroxine in 12/2014  Recent history:  Because of her continued symptoms of fatigue her dose was increased to 100 g in 5/17 She thinks she started feeling better after this She is very compliant with taking her levothyroxine before breakfast consistently in the morning  She also gained weight back, previously was watching her carbohydrates She also is having some chronic insomnia,  Her TSH has now gone back to normal with her dose of 100 g   Wt Readings from Last 3 Encounters:  04/09/16 172 lb (78 kg)  04/02/16 166 lb (75.3 kg)  03/05/16 168 lb (76.2 kg)    Lab Results  Component Value Date   TSH 1.84 04/05/2016   TSH 5.37 (H) 02/02/2016   TSH 3.624 08/08/2015   FREET4 1.2 04/05/2016   FREET4 1.1 02/02/2016   FREET4 1.31 08/08/2015         Medication List       Accurate as of 04/09/16  1:30 PM. Always use your most recent med list.          amphetamine-dextroamphetamine 20 MG tablet Commonly known as:  ADDERALL TAKE 30 MG BY MOUTH IN AM AND 20 MG IN PM   aspirin-acetaminophen-caffeine 250-250-65 MG tablet Commonly known as:  EXCEDRIN MIGRAINE Take 1 tablet by mouth every 6 (six) hours as needed.   B-12 PO Take 1 tablet by mouth daily. Reported on 01/21/2016   buPROPion 300 MG 24 hr tablet Commonly known as:  WELLBUTRIN XL Take 1  tablet (300 mg total) by mouth daily.   calcium carbonate 500 MG chewable tablet Commonly known as:  TUMS - dosed in mg elemental calcium Chew 1-2 tablets by mouth 2 (two) times daily.   conjugated estrogens vaginal cream Commonly known as:  PREMARIN Place 1 Applicatorful vaginally daily. Apply twice per week   FLUoxetine 20 MG capsule Commonly known as:  PROZAC TAKE ONE CAPSULE BY MOUTH ONCE DAILY **PLEASE  ESTABLISH  WITH  A  NEW  PROVIDER  FOR  FURTHER  REFILLS**   glucosamine-chondroitin 500-400 MG tablet Take 1 tablet by mouth 3 (three) times daily.   levothyroxine 100 MCG tablet Commonly known as:  SYNTHROID, LEVOTHROID Take 1 tablet (100 mcg total) by mouth daily.   Melatonin 3 MG Tabs Take 3 mg by mouth at bedtime. Reported on 01/21/2016   multivitamin capsule Take 1 capsule by mouth daily.   nitrofurantoin (macrocrystal-monohydrate) 100 MG capsule Commonly known as:  MACROBID Take 1 capsule (100 mg total) by mouth 2 (two) times daily.   ranitidine 150 MG tablet Commonly known as:  ZANTAC TAKE ONE TABLET BY MOUTH TWICE DAILY AS NEEDED FOR  HEARTBURN   valsartan-hydrochlorothiazide 320-25 MG tablet Commonly known as:  DIOVAN-HCT Take 1 tablet by mouth daily.   VOLTAREN  1 % Gel Generic drug:  diclofenac sodium APPLY   TOPICALLY TO AFFECTED AREA 4 TIMES DAILY       Allergies:  Allergies  Allergen Reactions  . Cephalexin Other (See Comments)    REACTION IS UNKNOWN    Past Medical History:  Diagnosis Date  . ALLERGIC RHINITIS 11/30/2008  . ALLERGIC RHINITIS 11/30/2008  . ASTHMA UNSPECIFIED WITH EXACERBATION 02/17/2008  . CAROTID BRUIT, LEFT 04/19/2007  . Closed fracture of lateral malleolus 06/04/2009  . CLOSED FRACTURE OF METATARSAL BONE 06/04/2009  . Esophageal reflux 11/08/2008  . GANGLION CYST 04/19/2007  . HYPERLIPIDEMIA NEC/NOS 04/19/2007  . HYPERTENSION 04/07/2007  . INSOMNIA 08/07/2010  . OSTEOARTHRITIS 04/07/2007  . OSTEOPENIA 03/10/2008  . UTI 07/20/2009      Past Surgical History:  Procedure Laterality Date  . knuckle replacement    . TOTAL HIP ARTHROPLASTY    . TUBAL LIGATION      Family History  Problem Relation Age of Onset  . Heart disease Mother     Mother also has significant carotid artery stenosis  . COPD Father   . Alcoholism Father   . Cancer Brother   . Alzheimer's disease Sister   . Thyroid disease Sister     Goiter  . Breast cancer Neg Hx     Social History:  reports that she quit smoking about 47 years ago. Her smoking use included Cigarettes. She has a 30.00 pack-year smoking history. She has never used smokeless tobacco. She reports that she does not drink alcohol or use drugs.   Review of Systems:   Review of Systems  Psychiatric/Behavioral: Positive for depression.      Has complaints of late insomnia, chronic   Recently followed by PCP for hypertension:  BP Readings from Last 3 Encounters:  04/09/16 128/80  04/02/16 138/90  03/05/16 140/80    Examination:   BP 128/80 (BP Location: Left Arm, Patient Position: Sitting)   Pulse 89   Wt 172 lb (78 kg)   SpO2 96%   BMI 29.52 kg/m      Assessment/Plan:    She has been on Levothyroxine supplementation for post ablative hypothyroidism, baseline TSH 16  Now with taking 100 g of levothyroxine daily she is having less fatigue and overall has been feeling better since her last visit TSH is now back to normal She is compliant with her medication daily Her weight tends to fluctuate based on her diet  She will continue to take the 100 g daily and follow-up in 4 months. Reminded her not to take any iron or calcium containing vitamins at the same time  There are no Patient Instructions on file for this visit.     [facility] 04/09/2016

## 2016-05-23 ENCOUNTER — Encounter: Payer: Self-pay | Admitting: Internal Medicine

## 2016-05-23 ENCOUNTER — Ambulatory Visit (INDEPENDENT_AMBULATORY_CARE_PROVIDER_SITE_OTHER): Payer: Medicare Other | Admitting: Internal Medicine

## 2016-05-23 VITALS — BP 126/84 | HR 72 | Temp 98.0°F | Resp 20 | Ht 62.0 in | Wt 167.0 lb

## 2016-05-23 DIAGNOSIS — F988 Other specified behavioral and emotional disorders with onset usually occurring in childhood and adolescence: Secondary | ICD-10-CM

## 2016-05-23 DIAGNOSIS — I10 Essential (primary) hypertension: Secondary | ICD-10-CM

## 2016-05-23 DIAGNOSIS — Z0000 Encounter for general adult medical examination without abnormal findings: Secondary | ICD-10-CM | POA: Diagnosis not present

## 2016-05-23 DIAGNOSIS — M150 Primary generalized (osteo)arthritis: Secondary | ICD-10-CM | POA: Diagnosis not present

## 2016-05-23 DIAGNOSIS — R0989 Other specified symptoms and signs involving the circulatory and respiratory systems: Secondary | ICD-10-CM | POA: Diagnosis not present

## 2016-05-23 DIAGNOSIS — M159 Polyosteoarthritis, unspecified: Secondary | ICD-10-CM

## 2016-05-23 DIAGNOSIS — E785 Hyperlipidemia, unspecified: Secondary | ICD-10-CM | POA: Diagnosis not present

## 2016-05-23 DIAGNOSIS — F909 Attention-deficit hyperactivity disorder, unspecified type: Secondary | ICD-10-CM

## 2016-05-23 LAB — POC URINALSYSI DIPSTICK (AUTOMATED)
BILIRUBIN UA: NEGATIVE
Glucose, UA: NEGATIVE
KETONES UA: NEGATIVE
Nitrite, UA: NEGATIVE
PH UA: 7.5
PROTEIN UA: NEGATIVE
SPEC GRAV UA: 1.015
Urobilinogen, UA: 0.2

## 2016-05-23 LAB — COMPREHENSIVE METABOLIC PANEL
ALK PHOS: 74 U/L (ref 39–117)
ALT: 38 U/L — AB (ref 0–35)
AST: 32 U/L (ref 0–37)
Albumin: 3.9 g/dL (ref 3.5–5.2)
BILIRUBIN TOTAL: 0.5 mg/dL (ref 0.2–1.2)
BUN: 19 mg/dL (ref 6–23)
CALCIUM: 9.7 mg/dL (ref 8.4–10.5)
CO2: 34 mEq/L — ABNORMAL HIGH (ref 19–32)
Chloride: 99 mEq/L (ref 96–112)
Creatinine, Ser: 0.91 mg/dL (ref 0.40–1.20)
GFR: 63.82 mL/min (ref >60.00)
Glucose, Bld: 91 mg/dL (ref 70–99)
Potassium: 3.5 mEq/L (ref 3.5–5.1)
Sodium: 137 mEq/L (ref 135–145)
TOTAL PROTEIN: 6.6 g/dL (ref 6.0–8.3)

## 2016-05-23 LAB — CBC WITH DIFFERENTIAL/PLATELET
Basophils Absolute: 0 10*3/uL (ref 0.0–0.1)
Basophils Relative: 0.5 % (ref 0.0–3.0)
EOS PCT: 1.8 % (ref 0.0–5.0)
Eosinophils Absolute: 0.1 10*3/uL (ref 0.0–0.7)
HCT: 42.7 % (ref 36.0–46.0)
Hemoglobin: 14.2 g/dL (ref 12.0–15.0)
LYMPHS ABS: 2.6 10*3/uL (ref 0.7–4.0)
Lymphocytes Relative: 33.4 % (ref 12.0–46.0)
MCHC: 33.3 g/dL (ref 30.0–36.0)
MCV: 90 fl (ref 78.0–100.0)
MONO ABS: 0.6 10*3/uL (ref 0.1–1.0)
MONOS PCT: 7.9 % (ref 3.0–12.0)
NEUTROS ABS: 4.4 10*3/uL (ref 1.4–7.7)
NEUTROS PCT: 56.4 % (ref 43.0–77.0)
PLATELETS: 343 10*3/uL (ref 150.0–400.0)
RBC: 4.74 Mil/uL (ref 3.87–5.11)
RDW: 14.3 % (ref 11.5–15.5)
WBC: 7.8 10*3/uL (ref 4.0–10.5)

## 2016-05-23 LAB — LIPID PANEL
Cholesterol: 197 mg/dL (ref 0–200)
HDL: 76.1 mg/dL (ref >39.00)
LDL Cholesterol: 108 mg/dL — ABNORMAL HIGH (ref 0–99)
NonHDL: 120.77
TRIGLYCERIDES: 62 mg/dL (ref 0.0–149.0)
Total CHOL/HDL Ratio: 3
VLDL: 12.4 mg/dL (ref 0.0–40.0)

## 2016-05-23 MED ORDER — AMPHETAMINE-DEXTROAMPHETAMINE 20 MG PO TABS: ORAL_TABLET | ORAL | 0 refills | Status: DC

## 2016-05-23 NOTE — Addendum Note (Signed)
Addended by: Gari Crown D on: 05/23/2016 10:38 AM   Modules accepted: Orders

## 2016-05-23 NOTE — Progress Notes (Signed)
Pre visit review using our clinic review tool, if applicable. No additional management support is needed unless otherwise documented below in the visit note. 

## 2016-05-23 NOTE — Progress Notes (Signed)
Subjective:    Patient ID: [name], female    DOB: 22-Oct-1940, 75 y.o.   MRN: [id number]  HPI 75 year old patient who is seen today for a preventive health examination. Medical problems include essential hypertension.  She has been followed in endocrinology for post-ablative hypothyroidism.  She has a history of dyslipidemia, gastroesophageal reflux disease and allergic rhinitis. Doing quite well today Last colonoscopy greater than 10 years ago She does have a history of a left carotid bruit.  Carotid artery ultrasound has been normal in the past.  Past Medical History:  Diagnosis Date  . ALLERGIC RHINITIS 11/30/2008  . ALLERGIC RHINITIS 11/30/2008  . ASTHMA UNSPECIFIED WITH EXACERBATION 02/17/2008  . CAROTID BRUIT, LEFT 04/19/2007  . Closed fracture of lateral malleolus 06/04/2009  . CLOSED FRACTURE OF METATARSAL BONE 06/04/2009  . Esophageal reflux 11/08/2008  . GANGLION CYST 04/19/2007  . HYPERLIPIDEMIA NEC/NOS 04/19/2007  . HYPERTENSION 04/07/2007  . INSOMNIA 08/07/2010  . OSTEOARTHRITIS 04/07/2007  . OSTEOPENIA 03/10/2008  . UTI 07/20/2009     Social History   Social History  . Marital status: Married    Spouse name: N/A  . Number of children: N/A  . Years of education: N/A   Occupational History  . retired Retired   Social History Main Topics  . Smoking status: Former Smoker    Packs/day: 1.50    Years: 20.00    Types: Cigarettes    Quit date: 09/15/1968  . Smokeless tobacco: Never Used     Comment: smoked 1.5 for  20 years  . Alcohol use No  . Drug use: No  . Sexual activity: Yes   Other Topics Concern  . Not on file   Social History Narrative   Married for 50 years. She has 1 son that age 24   Retired from Environmental [employer]          Past Surgical History:  Procedure Laterality Date  . knuckle replacement    . TOTAL HIP ARTHROPLASTY    . TUBAL LIGATION      Family History  Problem Relation Age of Onset  . Heart disease Mother    Mother also has significant carotid artery stenosis  . COPD Father   . Alcoholism Father   . Cancer Brother   . Alzheimer's disease Sister   . Thyroid disease Sister     Goiter  . Breast cancer Neg Hx     Allergies  Allergen Reactions  . Cephalexin Other (See Comments)    REACTION IS UNKNOWN    Current Outpatient Prescriptions on File Prior to Visit  Medication Sig Dispense Refill  . aspirin-acetaminophen-caffeine (EXCEDRIN MIGRAINE) 250-250-65 MG per tablet Take 1 tablet by mouth every 6 (six) hours as needed. (Patient taking differently: Take 2 tablets by mouth daily as needed for headache or migraine. ) 30 tablet 0  . buPROPion (WELLBUTRIN XL) 300 MG 24 hr tablet Take 1 tablet (300 mg total) by mouth daily. 90 tablet 1  . calcium carbonate (TUMS - DOSED IN MG ELEMENTAL CALCIUM) 500 MG chewable tablet Chew 1-2 tablets by mouth 2 (two) times daily.     Marland Kitchen conjugated estrogens (PREMARIN) vaginal cream Place 1 Applicatorful vaginally daily. Apply twice per week 42.5 g 12  . Cyanocobalamin (B-12 PO) Take 1 tablet by mouth daily. Reported on 01/21/2016    . FLUoxetine (PROZAC) 20 MG capsule TAKE ONE CAPSULE BY MOUTH ONCE DAILY **PLEASE  ESTABLISH  WITH  A  NEW  PROVIDER  FOR  FURTHER  REFILLS** 90 capsule 0  . glucosamine-chondroitin 500-400 MG tablet Take 1 tablet by mouth 3 (three) times daily.    Marland Kitchen levothyroxine (SYNTHROID, LEVOTHROID) 100 MCG tablet Take 1 tablet (100 mcg total) by mouth daily. 90 tablet 3  . Multiple Vitamin (MULTIVITAMIN) capsule Take 1 capsule by mouth daily.      . ranitidine (ZANTAC) 150 MG tablet TAKE ONE TABLET BY MOUTH TWICE DAILY AS NEEDED FOR  HEARTBURN 180 tablet 1  . valsartan-hydrochlorothiazide (DIOVAN-HCT) 320-25 MG tablet Take 1 tablet by mouth daily. 90 tablet 1  . VOLTAREN 1 % GEL APPLY   TOPICALLY TO AFFECTED AREA 4 TIMES DAILY (Patient taking differently: APPLY   TOPICALLY TO AFFECTED AREA 4 TIMES DAILY AS NEEDED FOR PAIN) 100 g 11  . Melatonin 3 MG  TABS Take 3 mg by mouth at bedtime. Reported on 01/21/2016     No current facility-administered medications on file prior to visit.     BP 126/84 (BP Location: Left Arm, Patient Position: Sitting, Cuff Size: Normal)   Pulse 72   Temp 98 F (36.7 C) (Oral)   Resp 20   Ht 5\' 2"  (1.575 m)   Wt 167 lb (75.8 kg)   SpO2 95%   BMI 30.54 kg/m    Medicare wellness exam  1. Risk factors, based on past  M,S,F history.  Risk factors include dyslipidemia and essential hypertension.    2.  Physical activities: sedentary   3.  Depression/mood: history of depression.  Remains on Prozac and Wellbutrin   4.  Hearing: No deficits  5.  ADL's:.  Independent  6.  Fall risk: low   7.  Home safety: no problems identified   8.  Height weight, and visual acuity; height and weight stable no change in visual acuity   9.  Counseling: more regular exercise encouraged.  Heart healthy diet.  Encouraged   10. Lab orders based on risk factors:.  We'll check laboratory update including lipid profile  11. Referral : follow-up endocrinology   12. Care plan: continue heart healthy diet and aggressive risk factor modification   13. Cognitive assessment:  alert and oriented with normal affect no cognitive dysfunction.    14. Screening: Patient provided with a written and personalized 5-10 year screening schedule in the AVS.   will set up for 10 year colonoscopy   15. Provider List Update:  primary care GI endocrinology radiology     Review of Systems  Constitutional: Negative for appetite change, fatigue, fever and unexpected weight change.  HENT: Negative for congestion, dental problem, ear pain, hearing loss, mouth sores, nosebleeds, sinus pressure, sore throat, tinnitus, trouble swallowing and voice change.   Eyes: Negative for photophobia, pain, redness and visual disturbance.  Respiratory: Negative for cough, chest tightness and shortness of breath.   Cardiovascular: Negative for chest pain,  palpitations and leg swelling.  Gastrointestinal: Negative for abdominal distention, abdominal pain, blood in stool, constipation, diarrhea, nausea, rectal pain and vomiting.  Genitourinary: Negative for difficulty urinating, dysuria, flank pain, frequency, genital sores, hematuria, menstrual problem, pelvic pain, urgency, vaginal bleeding, vaginal discharge and vaginal pain.  Musculoskeletal: Negative for arthralgias, back pain and neck stiffness.  Skin: Negative for rash.  Neurological: Negative for dizziness, syncope, speech difficulty, weakness, light-headedness, numbness and headaches.  Hematological: Negative for adenopathy. Does not bruise/bleed easily.  Psychiatric/Behavioral: Negative for agitation, behavioral problems, dysphoric mood, self-injury and suicidal ideas. The patient is not nervous/anxious.        Objective:  Physical Exam  Constitutional: She is oriented to person, place, and time. She appears well-developed and well-nourished.  HENT:  Head: Normocephalic and atraumatic.  Right Ear: External ear normal.  Left Ear: External ear normal.  Mouth/Throat: Oropharynx is clear and moist.  Upper dentures in place  Eyes: Conjunctivae and EOM are normal.  Neck: Normal range of motion. Neck supple. No JVD present. No thyromegaly present.  Cardiovascular: Normal rate, regular rhythm, normal heart sounds and intact distal pulses.   No murmur heard. Pulmonary/Chest: Effort normal and breath sounds normal. She has no wheezes. She has no rales.  Abdominal: Soft. Bowel sounds are normal. She exhibits no distension and no mass. There is no tenderness. There is no rebound and no guarding.  Musculoskeletal: Normal range of motion. She exhibits no edema or tenderness.  Neurological: She is alert and oriented to person, place, and time. She has normal reflexes. No cranial nerve deficit. She exhibits normal muscle tone. Coordination normal.  Skin: Skin is warm and dry. No rash noted.    Psychiatric: She has a normal mood and affect. Her behavior is normal.          Assessment & Plan:   Preventive health examination.  Schedule follow-up colonoscopy History of left carotid bruit.  Carotid ultrasounds have been negative in the past.  no bruit audible Dyslipidemia.  Review lipid profile Essential hypertension, well-controlled History depression, stable on present regimen  Follow-up 6 months or as needed  [employer]

## 2016-05-23 NOTE — Patient Instructions (Signed)
Limit your sodium (Salt) intake    It is important that you exercise regularly, at least 20 minutes 3 to 4 times per week.  If you develop chest pain or shortness of breath seek  medical attention.  Take a calcium supplement, plus [phone] units of vitamin D  Schedule your colonoscopy to help detect colon cancer.  Schedule your mammogram.  Return in 6 months for follow-up

## 2016-05-26 ENCOUNTER — Other Ambulatory Visit: Payer: Self-pay | Admitting: Internal Medicine

## 2016-05-26 DIAGNOSIS — F418 Other specified anxiety disorders: Secondary | ICD-10-CM

## 2016-07-07 ENCOUNTER — Other Ambulatory Visit: Payer: Self-pay

## 2016-07-10 ENCOUNTER — Other Ambulatory Visit: Payer: Self-pay | Admitting: Internal Medicine

## 2016-07-10 ENCOUNTER — Ambulatory Visit: Payer: Self-pay | Admitting: Endocrinology

## 2016-07-14 ENCOUNTER — Encounter: Payer: Self-pay | Admitting: Internal Medicine

## 2016-07-21 ENCOUNTER — Encounter: Payer: Self-pay | Admitting: Endocrinology

## 2016-08-01 ENCOUNTER — Other Ambulatory Visit: Payer: Self-pay | Admitting: Endocrinology

## 2016-08-01 DIAGNOSIS — Z23 Encounter for immunization: Secondary | ICD-10-CM | POA: Diagnosis not present

## 2016-08-01 DIAGNOSIS — E890 Postprocedural hypothyroidism: Secondary | ICD-10-CM | POA: Diagnosis not present

## 2016-08-01 LAB — TSH: TSH: 4.76 mIU/L — ABNORMAL HIGH

## 2016-08-01 LAB — T4, FREE: Free T4: 1.3 ng/dL (ref 0.8–1.8)

## 2016-08-04 ENCOUNTER — Encounter: Payer: Self-pay | Admitting: Endocrinology

## 2016-08-04 ENCOUNTER — Ambulatory Visit (INDEPENDENT_AMBULATORY_CARE_PROVIDER_SITE_OTHER): Payer: Medicare Other | Admitting: Endocrinology

## 2016-08-04 VITALS — BP 140/90 | HR 80 | Wt 180.0 lb

## 2016-08-04 DIAGNOSIS — E890 Postprocedural hypothyroidism: Secondary | ICD-10-CM | POA: Diagnosis not present

## 2016-08-04 NOTE — Progress Notes (Signed)
Patient ID: [name], female   DOB: 01-06-1941, 75 y.o.   MRN: [id number]    Reason for Appointment:  Follow-up of thyroid    History of Present Illness:   Previous history: She had a low TSH level as far back as 7 years ago She had symptoms of feeling hot and sweaty with physical activity, some fatigue and palpitations She has had long history of anxiety and depression   Because of her symptoms and TSH in the hyperthyroid range as well as uptake of 34% she was given I-131 treatment with 30 mCi on 09/27/14. Subsequently she was subjectively better When TSH  was  as high as 15.9 she was started on 75 g levothyroxine in 12/2014  Recent history:  TSH was 5.4 in 5/17 and because of symptoms of fatigue her dose was increased to 100 g Although she did have some improvement in energy level in 5/17 she continues to feel fatigued Does not think this is any different  She is very compliant with taking her levothyroxine before breakfast consistently in the morning  She also gained weight since her last visit to gain, previously had lost 20 pounds with improving her diet She also is having some chronic insomnia, on multiple antidepressants  Her TSH has now gone up to mildly increased levels even with a good level of 1.8 in July  Wt Readings from Last 3 Encounters:  08/04/16 180 lb (81.6 kg)  05/23/16 167 lb (75.8 kg)  04/09/16 172 lb (78 kg)     Lab Results  Component Value Date   TSH 4.76 (H) 08/01/2016   TSH 1.84 04/05/2016   TSH 5.37 (H) 02/02/2016   FREET4 1.3 08/01/2016   FREET4 1.2 04/05/2016   FREET4 1.1 02/02/2016         Medication List       Accurate as of 08/04/16  4:36 PM. Always use your most recent med list.          amphetamine-dextroamphetamine 20 MG tablet Commonly known as:  ADDERALL TAKE 30 MG BY MOUTH IN AM AND 20 MG IN PM   amphetamine-dextroamphetamine 20 MG tablet Commonly known as:  ADDERALL TAKE 30 MG BY MOUTH IN AM AND 20  MG IN PM   amphetamine-dextroamphetamine 20 MG tablet Commonly known as:  ADDERALL TAKE 30 MG IN AM AND 20 MG IN PM   aspirin-acetaminophen-caffeine 250-250-65 MG tablet Commonly known as:  EXCEDRIN MIGRAINE Take 1 tablet by mouth every 6 (six) hours as needed.   B-12 PO Take 1 tablet by mouth daily. Reported on 01/21/2016   buPROPion 300 MG 24 hr tablet Commonly known as:  WELLBUTRIN XL TAKE ONE TABLET BY MOUTH ONCE DAILY   calcium carbonate 500 MG chewable tablet Commonly known as:  TUMS - dosed in mg elemental calcium Chew 1-2 tablets by mouth 2 (two) times daily.   conjugated estrogens vaginal cream Commonly known as:  PREMARIN Place 1 Applicatorful vaginally daily. Apply twice per week   FLUoxetine 20 MG capsule Commonly known as:  PROZAC Take 1 capsule (20 mg total) by mouth daily.   glucosamine-chondroitin 500-400 MG tablet Take 1 tablet by mouth 3 (three) times daily.   levothyroxine 100 MCG tablet Commonly known as:  SYNTHROID, LEVOTHROID Take 1 tablet (100 mcg total) by mouth daily.   Melatonin 3 MG Tabs Take 3 mg by mouth at bedtime. Reported on 01/21/2016   multivitamin capsule Take 1 capsule by mouth daily.   ranitidine 150  MG tablet Commonly known as:  ZANTAC TAKE ONE TABLET BY MOUTH TWICE DAILY AS NEEDED FOR  HEARTBURN   valsartan-hydrochlorothiazide 320-25 MG tablet Commonly known as:  DIOVAN-HCT TAKE ONE TABLET BY MOUTH ONCE DAILY   VOLTAREN 1 % Gel Generic drug:  diclofenac sodium APPLY   TOPICALLY TO AFFECTED AREA 4 TIMES DAILY       Allergies:  Allergies  Allergen Reactions  . Cephalexin Other (See Comments)    REACTION IS UNKNOWN    Past Medical History:  Diagnosis Date  . ALLERGIC RHINITIS 11/30/2008  . ALLERGIC RHINITIS 11/30/2008  . ASTHMA UNSPECIFIED WITH EXACERBATION 02/17/2008  . CAROTID BRUIT, LEFT 04/19/2007  . Closed fracture of lateral malleolus 06/04/2009  . CLOSED FRACTURE OF METATARSAL BONE 06/04/2009  . Esophageal reflux  11/08/2008  . GANGLION CYST 04/19/2007  . HYPERLIPIDEMIA NEC/NOS 04/19/2007  . HYPERTENSION 04/07/2007  . INSOMNIA 08/07/2010  . OSTEOARTHRITIS 04/07/2007  . OSTEOPENIA 03/10/2008  . UTI 07/20/2009    Past Surgical History:  Procedure Laterality Date  . knuckle replacement    . TOTAL HIP ARTHROPLASTY    . TUBAL LIGATION      Family History  Problem Relation Age of Onset  . Heart disease Mother     Mother also has significant carotid artery stenosis  . COPD Father   . Alcoholism Father   . Cancer Brother   . Alzheimer's disease Sister   . Thyroid disease Sister     Goiter  . Breast cancer Neg Hx     Social History:  reports that she quit smoking about 47 years ago. Her smoking use included Cigarettes. She has a 30.00 pack-year smoking history. She has never used smokeless tobacco. She reports that she does not drink alcohol or use drugs.   Review of Systems:   Review of Systems  Psychiatric/Behavioral: Positive for depression.    Has complaints of late insomnia, chronic   Has been followed by PCP for hypertension:  BP Readings from Last 3 Encounters:  08/04/16 140/90  05/23/16 126/84  04/09/16 128/80    Examination:   BP 140/90   Pulse 80   Wt 180 lb (81.6 kg)   SpO2 96%   BMI 32.92 kg/m      Assessment/Plan:    She has been on Levothyroxine supplementation for post ablative hypothyroidism, baseline TSH 16  Now with taking 100 g of levothyroxine daily she is having Nonspecific fatigue and not clear if this is related to her insomnia and depression TSH is now back to mildly increased levels, previously normal She is compliant with her medication daily Her weight tends to fluctuate based on her diet and is continuing to go up and may be causing increased requirement for levothyroxine  She will go up to 112 g of levothyroxine and follow-up in 3 months  She will need to follow-up with her PCP for other problems  There are no Patient Instructions on file for  this visit.     [facility] 08/04/2016

## 2016-08-04 NOTE — Patient Instructions (Signed)
New Rx needed.

## 2016-08-05 MED ORDER — LEVOTHYROXINE SODIUM 112 MCG PO TABS: 112.0000 ug | ORAL_TABLET | Freq: Every day | ORAL | 3 refills | Status: DC

## 2016-08-11 ENCOUNTER — Ambulatory Visit: Payer: Self-pay | Admitting: Endocrinology

## 2016-08-25 ENCOUNTER — Other Ambulatory Visit: Payer: Self-pay | Admitting: Internal Medicine

## 2016-08-26 MED ORDER — AMPHETAMINE-DEXTROAMPHETAMINE 20 MG PO TABS: ORAL_TABLET | ORAL | 0 refills | Status: DC

## 2016-08-26 NOTE — Telephone Encounter (Signed)
Pt notified Rx's ready for pickup. Rx's printed and signed.  

## 2016-09-11 ENCOUNTER — Other Ambulatory Visit: Payer: Self-pay | Admitting: Internal Medicine

## 2016-09-11 MED ORDER — FLUOXETINE HCL 20 MG PO CAPS: 20.0000 mg | ORAL_CAPSULE | Freq: Every day | ORAL | Status: DC

## 2016-10-27 ENCOUNTER — Encounter: Payer: Self-pay | Admitting: Endocrinology

## 2016-10-31 ENCOUNTER — Other Ambulatory Visit: Payer: Self-pay | Admitting: Internal Medicine

## 2016-10-31 ENCOUNTER — Encounter: Payer: Self-pay | Admitting: Internal Medicine

## 2016-10-31 DIAGNOSIS — Z1231 Encounter for screening mammogram for malignant neoplasm of breast: Secondary | ICD-10-CM

## 2016-11-03 NOTE — Telephone Encounter (Signed)
[facility] called stated patient is there to get labs drawn and there are no lab orders in. please advise   Fax# [phone]

## 2016-11-04 ENCOUNTER — Other Ambulatory Visit: Payer: Self-pay | Admitting: Endocrinology

## 2016-11-04 DIAGNOSIS — E890 Postprocedural hypothyroidism: Secondary | ICD-10-CM | POA: Diagnosis not present

## 2016-11-04 LAB — T4, FREE: FREE T4: 1.3 ng/dL (ref 0.8–1.8)

## 2016-11-04 LAB — T3, FREE: T3 FREE: 2.7 pg/mL (ref 2.3–4.2)

## 2016-11-04 LAB — TSH: TSH: 5.14 m[IU]/L — AB

## 2016-11-05 ENCOUNTER — Ambulatory Visit (INDEPENDENT_AMBULATORY_CARE_PROVIDER_SITE_OTHER): Payer: Medicare Other | Admitting: Endocrinology

## 2016-11-05 ENCOUNTER — Encounter: Payer: Self-pay | Admitting: Endocrinology

## 2016-11-05 VITALS — BP 158/88 | HR 81 | Ht 62.0 in | Wt 185.0 lb

## 2016-11-05 DIAGNOSIS — E890 Postprocedural hypothyroidism: Secondary | ICD-10-CM

## 2016-11-05 MED ORDER — LEVOTHYROXINE SODIUM 137 MCG PO TABS: 137.0000 ug | ORAL_TABLET | Freq: Every day | ORAL | 3 refills | Status: DC

## 2016-11-05 NOTE — Progress Notes (Signed)
Patient ID: [name], female   DOB: 1941-01-24, 76 y.o.   MRN: [id number]    Reason for Appointment:  Follow-up of thyroid    History of Present Illness:   Previous history: She had a low TSH level as far back as 7 years ago She had symptoms of feeling hot and sweaty with physical activity, some fatigue and palpitations She has had long history of anxiety and depression   Because of her symptoms and TSH in the hyperthyroid range as well as uptake of 34% she was given I-131 treatment with 30 mCi on 09/27/14. Subsequently she was subjectively better When TSH  was  as high as 15.9 she was started on 75 g levothyroxine in 12/2014  Recent history:  TSH was 5.4 in 5/17 and because of symptoms of fatigue her dose was increased to 100 g Although she did have some improvement in energy level in 5/17 she continues to feel fatigued The dose was increased further back in 11/17 when TSH was mildly increased again  He is still having issues with fatigue and emotional problems as well as insomnia, has had stress at home Has gained 5 pounds again  She is very compliant with taking her levothyroxine before breakfast consistently in the morning   Her TSH has again gone up slightly more   Wt Readings from Last 3 Encounters:  11/05/16 185 lb (83.9 kg)  08/04/16 180 lb (81.6 kg)  05/23/16 167 lb (75.8 kg)     Lab Results  Component Value Date   TSH 5.14 (H) 11/04/2016   TSH 4.76 (H) 08/01/2016   TSH 1.84 04/05/2016   FREET4 1.3 11/04/2016   FREET4 1.3 08/01/2016   FREET4 1.2 04/05/2016       Allergies as of 11/05/2016      Reactions   Cephalexin Other (See Comments)   REACTION IS UNKNOWN      Medication List       Accurate as of 11/05/16  2:47 PM. Always use your most recent med list.          amphetamine-dextroamphetamine 20 MG tablet Commonly known as:  ADDERALL TAKE 30 MG BY MOUTH IN AM AND 20 MG IN PM   amphetamine-dextroamphetamine 20 MG  tablet Commonly known as:  ADDERALL TAKE 30 MG BY MOUTH IN AM AND 20 MG IN PM   amphetamine-dextroamphetamine 20 MG tablet Commonly known as:  ADDERALL TAKE 30 MG IN AM AND 20 MG IN PM   aspirin-acetaminophen-caffeine 250-250-65 MG tablet Commonly known as:  EXCEDRIN MIGRAINE Take 1 tablet by mouth every 6 (six) hours as needed.   B-12 PO Take 1 tablet by mouth daily. Reported on 01/21/2016   buPROPion 300 MG 24 hr tablet Commonly known as:  WELLBUTRIN XL TAKE ONE TABLET BY MOUTH ONCE DAILY   calcium carbonate 500 MG chewable tablet Commonly known as:  TUMS - dosed in mg elemental calcium Chew 1-2 tablets by mouth 2 (two) times daily.   conjugated estrogens vaginal cream Commonly known as:  PREMARIN Place 1 Applicatorful vaginally daily. Apply twice per week   FLUoxetine 20 MG capsule Commonly known as:  PROZAC Take 1 capsule (20 mg total) by mouth daily.   glucosamine-chondroitin 500-400 MG tablet Take 1 tablet by mouth 3 (three) times daily.   levothyroxine 112 MCG tablet Commonly known as:  SYNTHROID, LEVOTHROID Take 1 tablet (112 mcg total) by mouth daily.   Melatonin 3 MG Tabs Take 3 mg by mouth at bedtime.  Reported on 01/21/2016   multivitamin capsule Take 1 capsule by mouth daily.   ranitidine 150 MG tablet Commonly known as:  ZANTAC TAKE ONE TABLET BY MOUTH TWICE DAILY AS NEEDED FOR  HEARTBURN   valsartan-hydrochlorothiazide 320-25 MG tablet Commonly known as:  DIOVAN-HCT TAKE ONE TABLET BY MOUTH ONCE DAILY   VOLTAREN 1 % Gel Generic drug:  diclofenac sodium APPLY   TOPICALLY TO AFFECTED AREA 4 TIMES DAILY       Allergies:  Allergies  Allergen Reactions  . Cephalexin Other (See Comments)    REACTION IS UNKNOWN    Past Medical History:  Diagnosis Date  . ALLERGIC RHINITIS 11/30/2008  . ALLERGIC RHINITIS 11/30/2008  . ASTHMA UNSPECIFIED WITH EXACERBATION 02/17/2008  . CAROTID BRUIT, LEFT 04/19/2007  . Closed fracture of lateral malleolus 06/04/2009   . CLOSED FRACTURE OF METATARSAL BONE 06/04/2009  . Esophageal reflux 11/08/2008  . GANGLION CYST 04/19/2007  . HYPERLIPIDEMIA NEC/NOS 04/19/2007  . HYPERTENSION 04/07/2007  . INSOMNIA 08/07/2010  . OSTEOARTHRITIS 04/07/2007  . OSTEOPENIA 03/10/2008  . UTI 07/20/2009    Past Surgical History:  Procedure Laterality Date  . knuckle replacement    . TOTAL HIP ARTHROPLASTY    . TUBAL LIGATION      Family History  Problem Relation Age of Onset  . Heart disease Mother     Mother also has significant carotid artery stenosis  . COPD Father   . Alcoholism Father   . Cancer Brother   . Alzheimer's disease Sister   . Thyroid disease Sister     Goiter  . Breast cancer Neg Hx     Social History:  reports that she quit smoking about 48 years ago. Her smoking use included Cigarettes. She has a 30.00 pack-year smoking history. She has never used smokeless tobacco. She reports that she does not drink alcohol or use drugs.   Review of Systems:   Review of Systems  Psychiatric/Behavioral: Positive for depression.    Has complaints of late insomnia, chronic   Has been followed by PCP for hypertension: Blood pressure appears to be consistently high, she has not monitored at home, has a follow-up next month with him  BP Readings from Last 3 Encounters:  11/05/16 (!) 158/88  08/04/16 140/90  05/23/16 126/84    Examination:   BP (!) 158/88   Pulse 81   Ht 5\' 2"  (1.575 m)   Wt 185 lb (83.9 kg)   SpO2 93%   BMI 33.84 kg/m      Assessment/Plan:    She has been on Levothyroxine supplementation for post ablative hypothyroidism, baseline TSH 16  Now with taking 112 g of levothyroxine daily her TSH is again relatively high Appears to be needing progressively higher doses of levothyroxine but this may be partly related to her continued weight gain She is again having significant fatigue, depression and not clear how much of this is related to her thyroid   She will go up to 137 g of  levothyroxine and follow-up in 2 months  She will need to follow-up with her PCP for hypertension and depression  There are no Patient Instructions on file for this visit.     [facility] 11/05/2016

## 2016-11-05 NOTE — Patient Instructions (Signed)
Check BP at home

## 2016-11-13 ENCOUNTER — Ambulatory Visit (HOSPITAL_COMMUNITY)
Admission: RE | Admit: 2016-11-13 | Discharge: 2016-11-13 | Disposition: A | Discharge status: Home / Self Care | Payer: Medicare Other | Source: Ambulatory Visit | Attending: Internal Medicine | Admitting: Internal Medicine

## 2016-11-13 DIAGNOSIS — Z1231 Encounter for screening mammogram for malignant neoplasm of breast: Secondary | ICD-10-CM | POA: Insufficient documentation

## 2016-11-17 ENCOUNTER — Encounter: Payer: Self-pay | Admitting: Internal Medicine

## 2016-11-19 ENCOUNTER — Ambulatory Visit (INDEPENDENT_AMBULATORY_CARE_PROVIDER_SITE_OTHER): Payer: Medicare Other | Admitting: Internal Medicine

## 2016-11-19 ENCOUNTER — Encounter: Payer: Self-pay | Admitting: Internal Medicine

## 2016-11-19 VITALS — BP 146/82 | HR 81 | Temp 98.0°F | Ht 62.0 in | Wt 184.0 lb

## 2016-11-19 DIAGNOSIS — E890 Postprocedural hypothyroidism: Secondary | ICD-10-CM | POA: Diagnosis not present

## 2016-11-19 DIAGNOSIS — R8290 Unspecified abnormal findings in urine: Secondary | ICD-10-CM

## 2016-11-19 DIAGNOSIS — M150 Primary generalized (osteo)arthritis: Secondary | ICD-10-CM

## 2016-11-19 DIAGNOSIS — E0520 Thyrotoxicosis with toxic multinodular goiter without thyrotoxic crisis or storm: Secondary | ICD-10-CM

## 2016-11-19 DIAGNOSIS — E7800 Pure hypercholesterolemia, unspecified: Secondary | ICD-10-CM | POA: Diagnosis not present

## 2016-11-19 DIAGNOSIS — R946 Abnormal results of thyroid function studies: Secondary | ICD-10-CM | POA: Diagnosis not present

## 2016-11-19 DIAGNOSIS — Z0000 Encounter for general adult medical examination without abnormal findings: Secondary | ICD-10-CM

## 2016-11-19 DIAGNOSIS — I10 Essential (primary) hypertension: Secondary | ICD-10-CM | POA: Diagnosis not present

## 2016-11-19 DIAGNOSIS — F900 Attention-deficit hyperactivity disorder, predominantly inattentive type: Secondary | ICD-10-CM | POA: Diagnosis not present

## 2016-11-19 DIAGNOSIS — R7989 Other specified abnormal findings of blood chemistry: Secondary | ICD-10-CM

## 2016-11-19 DIAGNOSIS — M159 Polyosteoarthritis, unspecified: Secondary | ICD-10-CM

## 2016-11-19 LAB — POC URINALSYSI DIPSTICK (AUTOMATED)
Bilirubin, UA: NEGATIVE
GLUCOSE UA: NEGATIVE
KETONES UA: NEGATIVE
Leukocytes, UA: NEGATIVE
NITRITE UA: NEGATIVE
PH UA: 7
Protein, UA: NEGATIVE
Spec Grav, UA: 1.015
Urobilinogen, UA: 0.2

## 2016-11-19 MED ORDER — AMPHETAMINE-DEXTROAMPHETAMINE 30 MG PO TABS: ORAL_TABLET | ORAL | 0 refills | Status: DC

## 2016-11-19 MED ORDER — LEVOTHYROXINE SODIUM 137 MCG PO TABS: 137.0000 ug | ORAL_TABLET | Freq: Every day | ORAL | 3 refills | Status: DC

## 2016-11-19 MED ORDER — FLUOXETINE HCL 10 MG PO CAPS: 30.0000 mg | ORAL_CAPSULE | Freq: Every day | ORAL | 4 refills | Status: DC

## 2016-11-19 NOTE — Patient Instructions (Addendum)
Limit your sodium (Salt) intake    It is important that you exercise regularly, at least 20 minutes 3 to 4 times per week.  If you develop chest pain or shortness of breath seek  medical attention.  Please check your blood pressure on a regular basis.  If it is consistently greater than 150/90, please make an office appointment.  Return in 6 months for follow-up  Schedule your colonoscopy to help detect colon cancer.  Shoulder Impingement Syndrome Rehab Ask your health care provider which exercises are safe for you. Do exercises exactly as told by your health care provider and adjust them as directed. It is normal to feel mild stretching, pulling, tightness, or discomfort as you do these exercises, but you should stop right away if you feel sudden pain or your pain gets worse.Do not begin these exercises until told by your health care provider. Stretching and range of motion exercise This exercise warms up your muscles and joints and improves the movement and flexibility of your shoulder. This exercise also helps to relieve pain and stiffness. Exercise A: Passive horizontal adduction   1. Sit or stand and pull your left / right elbow across your chest, toward your other shoulder. Stop when you feel a gentle stretch in the back of your shoulder and upper arm.  Keep your arm at shoulder height.  Keep your arm as close to your body as you comfortably can. 2. Hold for __________ seconds. 3. Slowly return to the starting position. Repeat __________ times. Complete this exercise __________ times a day. Strengthening exercises These exercises build strength and endurance in your shoulder. Endurance is the ability to use your muscles for a long time, even after they get tired. Exercise B: External rotation, isometric  1. Stand or sit in a doorway, facing the door frame. 2. Bend your left / right elbow and place the back of your wrist against the door frame. Only your wrist should be touching the  frame. Keep your upper arm at your side. 3. Gently press your wrist against the door frame, as if you are trying to push your arm away from your abdomen.  Avoid shrugging your shoulder while you press your hand against the door frame. Keep your shoulder blade tucked down toward the middle of your back. 4. Hold for __________ seconds. 5. Slowly release the tension, and relax your muscles completely before you do the exercise again. Repeat __________ times. Complete this exercise __________ times a day. Exercise C: Internal rotation, isometric   1. Stand or sit in a doorway, facing the door frame. 2. Bend your left / right elbow and place the inside of your wrist against the door frame. Only your wrist should be touching the frame. Keep your upper arm at your side. 3. Gently press your wrist against the door frame, as if you are trying to push your arm toward your abdomen.  Avoid shrugging your shoulder while you press your hand against the door frame. Keep your shoulder blade tucked down toward the middle of your back. 4. Hold for __________ seconds. 5. Slowly release the tension, and relax your muscles completely before you do the exercise again. Repeat __________ times. Complete this exercise __________ times a day. Exercise D: Scapular protraction, supine   1. Lie on your back on a firm surface. Hold a __________ weight in your left / right hand. 2. Raise your left / right arm straight into the air so your hand is directly above your shoulder joint.  3. Push the weight into the air so your shoulder lifts off of the surface that you are lying on. Do not move your head, neck, or back. 4. Hold for __________ seconds. 5. Slowly return to the starting position. Let your muscles relax completely before you repeat this exercise. Repeat __________ times. Complete this exercise __________ times a day. Exercise E: Scapular retraction   1. Sit in a stable chair without armrests, or stand. 2. Secure  an exercise band to a stable object in front of you so the band is at shoulder height. 3. Hold one end of the exercise band in each hand. Your palms should face down. 4. Squeeze your shoulder blades together and move your elbows slightly behind you. Do not shrug your shoulders while you do this. 5. Hold for __________ seconds. 6. Slowly return to the starting position. Repeat __________ times. Complete this exercise __________ times a day. Exercise F: Shoulder extension   1. Sit in a stable chair without armrests, or stand. 2. Secure an exercise band to a stable object in front of you where the band is above shoulder height. 3. Hold one end of the exercise band in each hand. 4. Straighten your elbows and lift your hands up to shoulder height. 5. Squeeze your shoulder blades together and pull your hands down to the sides of your thighs. Stop when your hands are straight down by your sides. Do not let your hands go behind your body. 6. Hold for __________ seconds. 7. Slowly return to the starting position. Repeat __________ times. Complete this exercise __________ times a day. This information is not intended to replace advice given to you by your health care provider. Make sure you discuss any questions you have with your health care provider. Document Released: 09/01/2005 Document Revised: 05/08/2016 Document Reviewed: 08/04/2015 Elsevier Interactive Patient Education  2017 [employer].

## 2016-11-19 NOTE — Progress Notes (Signed)
Pre visit review using our clinic review tool, if applicable. No additional management support is needed unless otherwise documented below in the visit note. 

## 2016-11-19 NOTE — Progress Notes (Signed)
Subjective:    Patient ID: [name], female    DOB: 1940/11/10, 76 y.o.   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 She has a history of ADD.  She presently is on 30 mg of Adderall in the morning and 20 in the afternoon.  She feels she would benefit from a slightly higher second early afternoon dose She has a history of dyslipidemia and obesity.  She has osteoarthritis.  Past Medical History:  Diagnosis Date  . ALLERGIC RHINITIS 11/30/2008  . ALLERGIC RHINITIS 11/30/2008  . ASTHMA UNSPECIFIED WITH EXACERBATION 02/17/2008  . CAROTID BRUIT, LEFT 04/19/2007  . Closed fracture of lateral malleolus 06/04/2009  . CLOSED FRACTURE OF METATARSAL BONE 06/04/2009  . Esophageal reflux 11/08/2008  . GANGLION CYST 04/19/2007  . HYPERLIPIDEMIA NEC/NOS 04/19/2007  . HYPERTENSION 04/07/2007  . INSOMNIA 08/07/2010  . OSTEOARTHRITIS 04/07/2007  . OSTEOPENIA 03/10/2008  . UTI 07/20/2009     Social History   Social History  . Marital status: Married    Spouse name: N/A  . Number of children: N/A  . Years of education: N/A   Occupational History  . retired Retired   Social History Main Topics  . Smoking status: Former Smoker    Packs/day: 1.50    Years: 20.00    Types: Cigarettes    Quit date: 09/15/1968  . Smokeless tobacco: Never Used     Comment: smoked 1.5 for  20 years  . Alcohol use No  . Drug use: No  . Sexual activity: Yes   Other Topics Concern  . Not on file   Social History Narrative   Married for 50 years. She has 1 son that age 90   Retired from Environmental [employer]          Past Surgical History:  Procedure Laterality Date  . knuckle replacement    . TOTAL HIP ARTHROPLASTY    . TUBAL LIGATION      Family History  Problem Relation Age of Onset  . Heart disease Mother     Mother also has significant carotid  artery stenosis  . COPD Father   . Alcoholism Father   . Cancer Brother   . Alzheimer's disease Sister   . Thyroid disease Sister     Goiter  . Breast cancer Neg Hx     Allergies  Allergen Reactions  . Cephalexin Other (See Comments)    REACTION IS UNKNOWN    Current Outpatient Prescriptions on File Prior to Visit  Medication Sig Dispense Refill  . aspirin-acetaminophen-caffeine (EXCEDRIN MIGRAINE) 250-250-65 MG per tablet Take 1 tablet by mouth every 6 (six) hours as needed. (Patient taking differently: Take 2 tablets by mouth daily as needed for headache or migraine. ) 30 tablet 0  . buPROPion (WELLBUTRIN XL) 300 MG 24 hr tablet TAKE ONE TABLET BY MOUTH ONCE DAILY 90 tablet 1  . calcium carbonate (TUMS - DOSED IN MG ELEMENTAL CALCIUM) 500 MG chewable tablet Chew 1-2 tablets by mouth 2 (two) times daily.     Marland Kitchen conjugated estrogens (PREMARIN) vaginal cream Place 1 Applicatorful vaginally daily. Apply twice per week 42.5 g 12  . Cyanocobalamin (B-12 PO) Take 1 tablet by mouth daily. Reported on 01/21/2016    . glucosamine-chondroitin 500-400 MG tablet Take 1 tablet by mouth 3 (three) times daily.    . Melatonin 3 MG TABS Take  3 mg by mouth at bedtime. Reported on 01/21/2016    . Multiple Vitamin (MULTIVITAMIN) capsule Take 1 capsule by mouth daily.      . ranitidine (ZANTAC) 150 MG tablet TAKE ONE TABLET BY MOUTH TWICE DAILY AS NEEDED FOR  HEARTBURN 180 tablet 1  . valsartan-hydrochlorothiazide (DIOVAN-HCT) 320-25 MG tablet TAKE ONE TABLET BY MOUTH ONCE DAILY 90 tablet 1  . VOLTAREN 1 % GEL APPLY   TOPICALLY TO AFFECTED AREA 4 TIMES DAILY (Patient taking differently: APPLY   TOPICALLY TO AFFECTED AREA 4 TIMES DAILY AS NEEDED FOR PAIN) 100 g 11   No current facility-administered medications on file prior to visit.     BP (!) 146/82 (BP Location: Left Arm, Patient Position: Sitting, Cuff Size: Normal)   Pulse 81   Temp 98 F (36.7 C) (Oral)   Ht 5\' 2"  (1.575 m)   Wt 184 lb (83.5 kg)    SpO2 94%   BMI 33.65 kg/m    Review of Systems  Constitutional: Negative.   HENT: Negative for congestion, dental problem, hearing loss, rhinorrhea, sinus pressure, sore throat and tinnitus.   Eyes: Negative for pain, discharge and visual disturbance.  Respiratory: Negative for cough and shortness of breath.   Cardiovascular: Negative for chest pain, palpitations and leg swelling.  Gastrointestinal: Negative for abdominal distention, abdominal pain, blood in stool, constipation, diarrhea, nausea and vomiting.  Genitourinary: Negative for difficulty urinating, dysuria, flank pain, frequency, hematuria, pelvic pain, urgency, vaginal bleeding, vaginal discharge and vaginal pain.  Musculoskeletal: Negative for arthralgias, gait problem and joint swelling.  Skin: Negative for rash.  Neurological: Negative for dizziness, syncope, speech difficulty, weakness, numbness and headaches.  Hematological: Negative for adenopathy.  Psychiatric/Behavioral: Negative for agitation, behavioral problems and dysphoric mood. The patient is nervous/anxious.        Objective:   Physical Exam  Constitutional: She is oriented to person, place, and time. She appears well-developed and well-nourished.  Weight 184 Blood pressure 136/80  HENT:  Head: Normocephalic.  Right Ear: External ear normal.  Left Ear: External ear normal.  Mouth/Throat: Oropharynx is clear and moist.  Eyes: Conjunctivae and EOM are normal. Pupils are equal, round, and reactive to light.  Neck: Normal range of motion. Neck supple. No thyromegaly present.  Cardiovascular: Normal rate, regular rhythm, normal heart sounds and intact distal pulses.   Pulmonary/Chest: Effort normal and breath sounds normal.  Abdominal: Soft. Bowel sounds are normal. She exhibits no mass. There is no tenderness.  Musculoskeletal: Normal range of motion.  Lymphadenopathy:    She has no cervical adenopathy.  Neurological: She is alert and oriented to person,  place, and time.  Skin: Skin is warm and dry. No rash noted.  Psychiatric: She has a normal mood and affect. Her behavior is normal.          Assessment & Plan:   ADHD.  Will uptitrate slightly Adderall therapy Hypothyroidism.  Follow-up endocrine History of anxiety, depression.  Continue Prozac 30 mg daily Osteoarthritis  CPX 6 months.  Follow-up colonoscopy recommended  Nyoka Cowden

## 2016-12-13 IMAGING — NM NM RAI THERAPY FOR HYPERTHYROIDISM
1 series · 1 of 1 positions shown · non-contrast
Comparison: none

CLINICAL DATA: Hyperthyroidism.

EXAM:
RADIOACTIVE IODINE THERAPY FOR HYPERTHYROIDISM
TECHNIQUE: Radioactive iodine prescribed by Dr. William Rafael. The risks and
benefits of radioactive iodine therapy were discussed with the
patient in detail by Dr. Bass. Alternative therapies were also
mentioned. Radiation safety was discussed with the patient,
including how to protect the general public from exposure. There
were no barriers to communication. Written consent was obtained. The
patient then received a capsule containing the radiopharmaceutical.
The patient will follow-up with the referring physician.
RADIOPHARMACEUTICALS:  30 mCi D-BSB sodium iodide.

[Series 1: bone statics · 2.07mm/px · 1 of 1 slices shown]
[im 1/1]
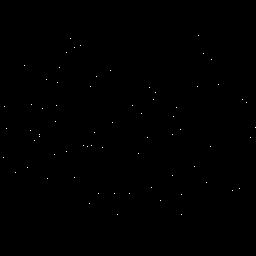

[1 of 1 positions shown; findings below may reference images not displayed]

IMPRESSION: Per oral administration of D-BSB sodium iodide for the treatment of
hyperthyroidism.

## 2016-12-22 ENCOUNTER — Other Ambulatory Visit: Payer: Self-pay | Admitting: Internal Medicine

## 2016-12-23 DIAGNOSIS — Z96642 Presence of left artificial hip joint: Secondary | ICD-10-CM | POA: Diagnosis not present

## 2016-12-23 DIAGNOSIS — M19012 Primary osteoarthritis, left shoulder: Secondary | ICD-10-CM | POA: Diagnosis not present

## 2016-12-23 DIAGNOSIS — M25511 Pain in right shoulder: Secondary | ICD-10-CM | POA: Diagnosis not present

## 2016-12-31 ENCOUNTER — Other Ambulatory Visit: Payer: Self-pay | Admitting: Endocrinology

## 2016-12-31 DIAGNOSIS — E890 Postprocedural hypothyroidism: Secondary | ICD-10-CM | POA: Diagnosis not present

## 2017-01-01 LAB — T4, FREE: Free T4: 1.5 ng/dL (ref 0.8–1.8)

## 2017-01-02 ENCOUNTER — Ambulatory Visit (INDEPENDENT_AMBULATORY_CARE_PROVIDER_SITE_OTHER): Payer: Medicare Other | Admitting: Endocrinology

## 2017-01-02 VITALS — BP 140/80 | HR 84 | Ht 62.0 in | Wt 190.0 lb

## 2017-01-02 DIAGNOSIS — E890 Postprocedural hypothyroidism: Secondary | ICD-10-CM | POA: Diagnosis not present

## 2017-01-02 NOTE — Progress Notes (Signed)
Patient ID: [name], female   DOB: Jun 06, 1941, 76 y.o.   MRN: [id number]    Reason for Appointment:  Follow-up of thyroid     History of Present Illness:   Previous history: She had a low TSH level as far back as 7 years ago She had symptoms of feeling hot and sweaty with physical activity, some fatigue and palpitations She has had long history of anxiety and depression   Because of her symptoms and TSH in the hyperthyroid range as well as uptake of 34% she was given I-131 treatment with 30 mCi on 09/27/14. Subsequently she was subjectively better When TSH  was  as high as 15.9 she was started on 75 g levothyroxine in 12/2014  Recent history:  She appears to have had progressive increase in her levothyroxine dose since 2017 TSH was 5.4 in 5/17 and because of symptoms of fatigue her dose was increased to 100 g The dose was increased further in 11/17 and also in 2/18 She is now taking 137 g daily  Although she has had persistent fatigue for various reasons she says she is feeling better in the last 2 weeks with less tiredness However she thinks she is also sleeping a little better She is having some issues with hypothyroidism over the last 2-3 months but it may be starting to get better now  Has gained 6 pounds however  She is very compliant with taking her levothyroxine before breakfast consistently in the morning  Her TSH is pending   Wt Readings from Last 3 Encounters:  01/02/17 190 lb (86.2 kg)  11/19/16 184 lb (83.5 kg)  11/05/16 185 lb (83.9 kg)     Lab Results  Component Value Date   TSH 5.14 (H) 11/04/2016   TSH 4.76 (H) 08/01/2016   TSH 1.84 04/05/2016   FREET4 1.5 12/31/2016   FREET4 1.3 11/04/2016   FREET4 1.3 08/01/2016       Allergies as of 01/02/2017      Reactions   Cephalexin Other (See Comments)   REACTION IS UNKNOWN      Medication List       Accurate as of 01/02/17  2:46 PM. Always use your most recent med list.            amphetamine-dextroamphetamine 30 MG tablet Commonly known as:  ADDERALL TAKE 30 MG BY MOUTH IN AM AND 30 MG IN PM   aspirin-acetaminophen-caffeine 250-250-65 MG tablet Commonly known as:  EXCEDRIN MIGRAINE Take 1 tablet by mouth every 6 (six) hours as needed.   B-12 PO Take 1 tablet by mouth daily. Reported on 01/21/2016   buPROPion 300 MG 24 hr tablet Commonly known as:  WELLBUTRIN XL TAKE ONE TABLET BY MOUTH ONCE DAILY   calcium carbonate 500 MG chewable tablet Commonly known as:  TUMS - dosed in mg elemental calcium Chew 1-2 tablets by mouth 2 (two) times daily.   conjugated estrogens vaginal cream Commonly known as:  PREMARIN Place 1 Applicatorful vaginally daily. Apply twice per week   FLUoxetine 10 MG capsule Commonly known as:  PROZAC Take 3 capsules (30 mg total) by mouth daily.   glucosamine-chondroitin 500-400 MG tablet Take 1 tablet by mouth 3 (three) times daily.   levothyroxine 137 MCG tablet Commonly known as:  SYNTHROID Take 1 tablet (137 mcg total) by mouth daily before breakfast.   Melatonin 3 MG Tabs Take 3 mg by mouth at bedtime. Reported on 01/21/2016   multivitamin capsule Take 1  capsule by mouth daily.   ranitidine 150 MG tablet Commonly known as:  ZANTAC TAKE ONE TABLET BY MOUTH TWICE DAILY AS NEEDED FOR  HEARTBURN   valsartan-hydrochlorothiazide 320-25 MG tablet Commonly known as:  DIOVAN-HCT TAKE ONE TABLET BY MOUTH ONCE DAILY   VOLTAREN 1 % Gel Generic drug:  diclofenac sodium APPLY   TOPICALLY TO AFFECTED AREA 4 TIMES DAILY       Allergies:  Allergies  Allergen Reactions  . Cephalexin Other (See Comments)    REACTION IS UNKNOWN    Past Medical History:  Diagnosis Date  . ALLERGIC RHINITIS 11/30/2008  . ALLERGIC RHINITIS 11/30/2008  . ASTHMA UNSPECIFIED WITH EXACERBATION 02/17/2008  . CAROTID BRUIT, LEFT 04/19/2007  . Closed fracture of lateral malleolus 06/04/2009  . CLOSED FRACTURE OF METATARSAL BONE 06/04/2009  .  Esophageal reflux 11/08/2008  . GANGLION CYST 04/19/2007  . HYPERLIPIDEMIA NEC/NOS 04/19/2007  . HYPERTENSION 04/07/2007  . INSOMNIA 08/07/2010  . OSTEOARTHRITIS 04/07/2007  . OSTEOPENIA 03/10/2008  . UTI 07/20/2009    Past Surgical History:  Procedure Laterality Date  . knuckle replacement    . TOTAL HIP ARTHROPLASTY    . TUBAL LIGATION      Family History  Problem Relation Age of Onset  . Heart disease Mother     Mother also has significant carotid artery stenosis  . COPD Father   . Alcoholism Father   . Cancer Brother   . Alzheimer's disease Sister   . Thyroid disease Sister     Goiter  . Breast cancer Neg Hx     Social History:  reports that she quit smoking about 48 years ago. Her smoking use included Cigarettes. She has a 30.00 pack-year smoking history. She has never used smokeless tobacco. She reports that she does not drink alcohol or use drugs.   Review of Systems:   ROS   Has complaints of late insomnia, chronic And is doing a little better recently with melatonin OTC  Has been followed by PCP for hypertension: Blood pressure appears relatively better today   BP Readings from Last 3 Encounters:  01/02/17 140/80  11/19/16 (!) 146/82  11/05/16 (!) 158/88    Examination:   BP 140/80   Pulse 84   Ht 5\' 2"  (1.575 m)   Wt 190 lb (86.2 kg)   BMI 34.75 kg/m      Assessment/Plan:    She has been on Levothyroxine supplementation for post ablative hypothyroidism, baseline TSH 16  Now with taking 137 g of levothyroxine daily her fatigue is improving However her fatigue is probably more likely related to her insomnia and anxiety/depression Also having some hair loss which may be related to recent hypothyroidism TSH is pending  OBESITY:She does need to focus on her caloric intake and start various times of exercises for weight loss   Follow-up to be decided  There are no Patient Instructions on file for this  visit.     Talen Poser 01/02/2017   ADDENDUM: TSH 0.33, she will need to take 6-1/2 tablets weekly of her levothyroxine

## 2017-01-03 LAB — TSH: TSH: 0.33 mIU/L — ABNORMAL LOW

## 2017-01-05 ENCOUNTER — Other Ambulatory Visit: Payer: Self-pay

## 2017-01-05 ENCOUNTER — Telehealth: Payer: Self-pay | Admitting: Endocrinology

## 2017-01-05 MED ORDER — LEVOTHYROXINE SODIUM 137 MCG PO TABS: 137.0000 ug | ORAL_TABLET | Freq: Every day | ORAL | 3 refills | Status: DC

## 2017-01-05 NOTE — Telephone Encounter (Signed)
This has been ordered and there was not an increase at this time

## 2017-01-05 NOTE — Telephone Encounter (Signed)
Pt is asking for a refill of the synthyroid call into [facility] please if there is a change in dosage.

## 2017-01-16 ENCOUNTER — Other Ambulatory Visit: Payer: Self-pay

## 2017-02-23 ENCOUNTER — Telehealth: Payer: Self-pay | Admitting: Internal Medicine

## 2017-02-23 NOTE — Telephone Encounter (Signed)
Patient needs lab orders sent to [facility] before seeing Dr. Dwyane Dee in June. She has an appt sched on July 25 but it needs to be moved to June, however the labs need to be sorted out first.  Patient would like a call to discuss the labs being ordered as well because one or two were missed the last time.  [facility] Fax: [phone]  Thank you,  -LL

## 2017-02-23 NOTE — Telephone Encounter (Signed)
Spoke to the patient and she needed an earlier appointment and lab orders faxed to [facility]- this has been done and patient will be added to the list if an earlier appointment becomes available

## 2017-02-25 ENCOUNTER — Other Ambulatory Visit: Payer: Self-pay | Admitting: Internal Medicine

## 2017-02-27 ENCOUNTER — Telehealth: Payer: Self-pay | Admitting: Internal Medicine

## 2017-02-27 ENCOUNTER — Other Ambulatory Visit: Payer: Self-pay

## 2017-02-27 MED ORDER — AMPHETAMINE-DEXTROAMPHETAMINE 30 MG PO TABS: ORAL_TABLET | ORAL | 0 refills | Status: DC

## 2017-02-27 NOTE — Telephone Encounter (Signed)
° °  Pt request refill of the following: ° ° °amphetamine-dextroamphetamine (ADDERALL) 30 MG tablet ° ° °Phamacy: °

## 2017-02-27 NOTE — Telephone Encounter (Signed)
Rx was signed and patient is aware to pick up Rx in the office .

## 2017-02-27 NOTE — Telephone Encounter (Signed)
Pt is completely out of medication and needs refill TODAY please.

## 2017-02-27 NOTE — Telephone Encounter (Signed)
Okay for refill?  

## 2017-03-02 ENCOUNTER — Other Ambulatory Visit: Payer: Self-pay | Admitting: Endocrinology

## 2017-03-02 DIAGNOSIS — E890 Postprocedural hypothyroidism: Secondary | ICD-10-CM | POA: Diagnosis not present

## 2017-03-02 MED ORDER — AMPHETAMINE-DEXTROAMPHETAMINE 30 MG PO TABS: ORAL_TABLET | ORAL | 0 refills | Status: DC

## 2017-03-02 NOTE — Telephone Encounter (Signed)
Pt notified via voice message that Rx ready for pickup. Rx printed and signed. 

## 2017-03-02 NOTE — Telephone Encounter (Signed)
Rx printed awaiting to be signed.  

## 2017-03-03 LAB — TSH: TSH: 0.35 m[IU]/L — AB

## 2017-03-03 LAB — T4, FREE: FREE T4: 1.7 ng/dL (ref 0.8–1.8)

## 2017-03-03 LAB — T3, FREE: T3 FREE: 2.8 pg/mL (ref 2.3–4.2)

## 2017-03-06 ENCOUNTER — Ambulatory Visit: Payer: Self-pay | Admitting: Endocrinology

## 2017-04-03 ENCOUNTER — Encounter: Payer: Self-pay | Admitting: Endocrinology

## 2017-04-06 ENCOUNTER — Encounter: Payer: Self-pay | Admitting: Internal Medicine

## 2017-04-08 ENCOUNTER — Ambulatory Visit: Payer: Self-pay | Admitting: Endocrinology

## 2017-04-08 NOTE — Progress Notes (Unsigned)
Patient called in to follow up. No response on the below. Please review and respond to patient via cell # [phone]. She believes that the June lab work is too old to use.

## 2017-04-08 NOTE — Progress Notes (Unsigned)
Pt is out of bp med and needs another med call into pharm . Please call patient once rx has been sent

## 2017-04-10 ENCOUNTER — Other Ambulatory Visit: Payer: Self-pay | Admitting: Internal Medicine

## 2017-04-10 ENCOUNTER — Other Ambulatory Visit: Payer: Self-pay | Admitting: Endocrinology

## 2017-04-10 DIAGNOSIS — E890 Postprocedural hypothyroidism: Secondary | ICD-10-CM | POA: Diagnosis not present

## 2017-04-10 LAB — TSH: TSH: 0.36 m[IU]/L — AB

## 2017-04-10 LAB — T3, FREE: T3, Free: 2.8 pg/mL (ref 2.3–4.2)

## 2017-04-10 LAB — T4, FREE: FREE T4: 1.8 ng/dL (ref 0.8–1.8)

## 2017-04-10 MED ORDER — LOSARTAN POTASSIUM-HCTZ 100-25 MG PO TABS: 1.0000 | ORAL_TABLET | Freq: Every day | ORAL | 3 refills | Status: DC

## 2017-04-13 ENCOUNTER — Ambulatory Visit (INDEPENDENT_AMBULATORY_CARE_PROVIDER_SITE_OTHER): Payer: Medicare Other | Admitting: Endocrinology

## 2017-04-13 ENCOUNTER — Encounter: Payer: Self-pay | Admitting: Endocrinology

## 2017-04-13 VITALS — BP 114/74 | HR 108 | Ht 62.0 in | Wt 182.0 lb

## 2017-04-13 DIAGNOSIS — E890 Postprocedural hypothyroidism: Secondary | ICD-10-CM

## 2017-04-13 MED ORDER — LEVOTHYROXINE SODIUM 112 MCG PO TABS: 112.0000 ug | ORAL_TABLET | Freq: Every day | ORAL | 3 refills | Status: DC

## 2017-04-13 MED ORDER — LIOTHYRONINE SODIUM 5 MCG PO TABS: 5.0000 ug | ORAL_TABLET | Freq: Every day | ORAL | 3 refills | Status: DC

## 2017-04-13 NOTE — Progress Notes (Signed)
Patient ID: [name], female   DOB: [birth year], 76 y.o.   MRN: [id number]    Reason for Appointment:  Follow-up of thyroid     History of Present Illness:   Previous history: She had a low TSH level as far back as 7 years ago She had symptoms of feeling hot and sweaty with physical activity, some fatigue and palpitations She has had long history of anxiety and depression   Because of her symptoms and TSH in the hyperthyroid range as well as uptake of 34% she was given I-131 treatment with 30 mCi on 09/27/14. Subsequently she was subjectively better When TSH  was  as high as 15.9 she was started on 75 g levothyroxine in 12/2014  Recent history:  She has had progressive increase in her levothyroxine dose since 2017 TSH was 5.4 in 5/17 and because of symptoms of fatigue her dose was increased to 100 g The dose was increased further in 11/17 and also in 2/18 She is now taking 137 g daily However with this dose her TSH was mildly decreased  She is supposed to be taking 6-1/2 tablets a week but regarding instructions and is taking one tablet daily Again her TSH is low normal done from outside lab  She now says that she is feeling consistently tired although has an occasional day where she feels better She also thinks she is losing hair consistently, no cold intolerance  Again has difficulties with periodic insomnia and depression Her weight has presently gone down, previously has had difficulty losing weight  She is very compliant with taking her levothyroxine before breakfast consistently in the morning  Appears to be having a relatively low free T3 level was high normal free T4 the last 2 times including with PCP last month   Wt Readings from Last 3 Encounters:  04/13/17 182 lb (82.6 kg)  01/02/17 190 lb (86.2 kg)  11/19/16 184 lb (83.5 kg)     Lab Results  Component Value Date   TSH 0.36 (L) 04/10/2017   TSH 0.35 (L) 03/02/2017   TSH 0.33 (L)  12/31/2016   FREET4 1.8 04/10/2017   FREET4 1.7 03/02/2017   FREET4 1.5 12/31/2016    Lab Results  Component Value Date   T3FREE 2.8 04/10/2017   T3FREE 2.8 03/02/2017   T3FREE 2.7 11/04/2016   T3FREE 2.7 03/13/2015      Allergies as of 04/13/2017      Reactions   Cephalexin Other (See Comments)   REACTION IS UNKNOWN      Medication List       Accurate as of 04/13/17  3:55 PM. Always use your most recent med list.          amphetamine-dextroamphetamine 30 MG tablet Commonly known as:  ADDERALL TAKE 30 MG BY MOUTH IN AM AND 30 MG IN PM   aspirin-acetaminophen-caffeine 250-250-65 MG tablet Commonly known as:  EXCEDRIN MIGRAINE Take 1 tablet by mouth every 6 (six) hours as needed.   B-12 PO Take 1 tablet by mouth daily. Reported on 01/21/2016   buPROPion 300 MG 24 hr tablet Commonly known as:  WELLBUTRIN XL TAKE ONE TABLET BY MOUTH ONCE DAILY   calcium carbonate 500 MG chewable tablet Commonly known as:  TUMS - dosed in mg elemental calcium Chew 1-2 tablets by mouth 2 (two) times daily.   conjugated estrogens vaginal cream Commonly known as:  PREMARIN Place 1 Applicatorful vaginally daily. Apply twice per week   FLUoxetine 10  MG capsule Commonly known as:  PROZAC Take 3 capsules (30 mg total) by mouth daily.   glucosamine-chondroitin 500-400 MG tablet Take 1 tablet by mouth 3 (three) times daily.   levothyroxine 137 MCG tablet Commonly known as:  SYNTHROID Take 1 tablet (137 mcg total) by mouth daily before breakfast.   losartan-hydrochlorothiazide 100-25 MG tablet Commonly known as:  HYZAAR Take 1 tablet by mouth daily.   Melatonin 3 MG Tabs Take 3 mg by mouth at bedtime. Reported on 01/21/2016   multivitamin capsule Take 1 capsule by mouth daily.   ranitidine 150 MG tablet Commonly known as:  ZANTAC TAKE ONE TABLET BY MOUTH TWICE DAILY AS NEEDED FOR  HEARTBURN   VOLTAREN 1 % Gel Generic drug:  diclofenac sodium APPLY   TOPICALLY TO AFFECTED  AREA 4 TIMES DAILY       Allergies:  Allergies  Allergen Reactions  . Cephalexin Other (See Comments)    REACTION IS UNKNOWN    Past Medical History:  Diagnosis Date  . ALLERGIC RHINITIS 11/30/2008  . ALLERGIC RHINITIS 11/30/2008  . ASTHMA UNSPECIFIED WITH EXACERBATION 02/17/2008  . CAROTID BRUIT, LEFT 04/19/2007  . Closed fracture of lateral malleolus 06/04/2009  . CLOSED FRACTURE OF METATARSAL BONE 06/04/2009  . Esophageal reflux 11/08/2008  . GANGLION CYST 04/19/2007  . HYPERLIPIDEMIA NEC/NOS 04/19/2007  . HYPERTENSION 04/07/2007  . INSOMNIA 08/07/2010  . OSTEOARTHRITIS 04/07/2007  . OSTEOPENIA 03/10/2008  . UTI 07/20/2009    Past Surgical History:  Procedure Laterality Date  . knuckle replacement    . TOTAL HIP ARTHROPLASTY    . TUBAL LIGATION      Family History  Problem Relation Age of Onset  . Heart disease Mother        Mother also has significant carotid artery stenosis  . COPD Father   . Alcoholism Father   . Cancer Brother   . Alzheimer's disease Sister   . Thyroid disease Sister        Goiter  . Breast cancer Neg Hx     Social History:  reports that she quit smoking about 48 years ago. Her smoking use included Cigarettes. She has a 30.00 pack-year smoking history. She has never used smokeless tobacco. She reports that she does not drink alcohol or use drugs.   Review of Systems:   ROS   Has complaints of late insomnia, chronic, Has had some relief with melatonin OTC  She is on multiple treatment for depression/ADHD but she is not feeling completely better  Has been followed by PCP for hypertension:  Blood pressure readings:   BP Readings from Last 3 Encounters:  04/13/17 114/74  01/02/17 140/80  11/19/16 (!) 146/82    Examination:   BP 114/74   Pulse (!) 108   Ht 5\' 2"  (1.575 m)   Wt 182 lb (82.6 kg)   SpO2 96%   BMI 33.29 kg/m      Assessment/Plan:    She has been on Levothyroxine supplementation for post ablative hypothyroidism,  baseline TSH 16  Now with taking 137 g of levothyroxine daily her TSH level has been consistently low normal Although she was subjectively doing better on her last that she is again complaining of fatigue This is despite her being able to lose weight She does appear to have high normal free T4 now done from outside lab along with relatively low free T3 level  Discussed with the patient that she may benefit from adding Cytomel to her regimen while decreasing her  free T4 and these medications will need to be adjust based on her follow-up levels; she may subjectively do better with this regimen Given lab requisition forms for her labs for next time  She will also discuss her medications for depression with psychiatrist  Follow-up in 2 months  There are no Patient Instructions on file for this visit.     [facility] 04/13/2017

## 2017-04-15 ENCOUNTER — Telehealth: Payer: Self-pay | Admitting: Endocrinology

## 2017-04-15 NOTE — Telephone Encounter (Signed)
Called patient and let her know that yes, she will be on the 112 mcg levothyroxine while on the cytomel. She stated that she actually feels so much better now that she is not taking the Valsartan anymore.

## 2017-04-15 NOTE — Telephone Encounter (Signed)
Yes.  We have reduced the levothyroxine while adding the Cytomel

## 2017-04-15 NOTE — Telephone Encounter (Signed)
Patient is asking you to call her about her meds. She would not give any information. Verified cell #

## 2017-04-15 NOTE — Telephone Encounter (Signed)
Called patient and she wanted to make sure that you had changed her Levothyroxine to the 112 mcg instead of the 137 mcg since you put her on the Cytomel. Please advise.

## 2017-04-16 ENCOUNTER — Telehealth: Payer: Self-pay | Admitting: Internal Medicine

## 2017-04-16 NOTE — Telephone Encounter (Signed)
[facility] called about pt Fluoxetine 10mg  cap. Take 3 caps QD.  PA completed.  Ref number: [id number]

## 2017-04-17 ENCOUNTER — Other Ambulatory Visit: Payer: Self-pay | Admitting: Internal Medicine

## 2017-04-17 MED ORDER — FLUOXETINE HCL 10 MG PO CAPS: 30.0000 mg | ORAL_CAPSULE | Freq: Every day | ORAL | 0 refills | Status: DC

## 2017-04-17 NOTE — Telephone Encounter (Signed)
Pt is waiting on [facility] mail order and needs 30 day supply of fluoxetine 10 mg send to [facility] in [place] on [address]

## 2017-04-17 NOTE — Telephone Encounter (Signed)
Rx sent 

## 2017-04-19 ENCOUNTER — Other Ambulatory Visit: Payer: Self-pay | Admitting: Internal Medicine

## 2017-04-22 ENCOUNTER — Other Ambulatory Visit: Payer: Self-pay | Admitting: Internal Medicine

## 2017-04-22 MED ORDER — FLUOXETINE HCL 10 MG PO CAPS: ORAL_CAPSULE | ORAL | 2 refills | Status: DC

## 2017-04-29 ENCOUNTER — Other Ambulatory Visit: Payer: Self-pay | Admitting: Internal Medicine

## 2017-04-29 MED ORDER — FLUOXETINE HCL 10 MG PO CAPS: ORAL_CAPSULE | ORAL | 3 refills | Status: DC

## 2017-05-08 ENCOUNTER — Ambulatory Visit (INDEPENDENT_AMBULATORY_CARE_PROVIDER_SITE_OTHER): Payer: Medicare Other | Admitting: Internal Medicine

## 2017-05-08 ENCOUNTER — Encounter: Payer: Self-pay | Admitting: Internal Medicine

## 2017-05-08 VITALS — BP 142/80 | HR 86 | Temp 98.0°F | Ht 62.0 in | Wt 183.2 lb

## 2017-05-08 DIAGNOSIS — E7800 Pure hypercholesterolemia, unspecified: Secondary | ICD-10-CM

## 2017-05-08 DIAGNOSIS — I10 Essential (primary) hypertension: Secondary | ICD-10-CM | POA: Diagnosis not present

## 2017-05-08 DIAGNOSIS — F900 Attention-deficit hyperactivity disorder, predominantly inattentive type: Secondary | ICD-10-CM

## 2017-05-08 DIAGNOSIS — E890 Postprocedural hypothyroidism: Secondary | ICD-10-CM | POA: Diagnosis not present

## 2017-05-08 MED ORDER — AMPHETAMINE-DEXTROAMPHETAMINE 30 MG PO TABS: ORAL_TABLET | ORAL | 0 refills | Status: DC

## 2017-05-08 MED ORDER — DICLOFENAC SODIUM 1 % TD GEL: TRANSDERMAL | 11 refills | Status: DC

## 2017-05-08 MED ORDER — RANITIDINE HCL 150 MG PO TABS: ORAL_TABLET | ORAL | 1 refills | Status: DC

## 2017-05-08 NOTE — Progress Notes (Signed)
Subjective:    Patient ID: [name], female    DOB: [birth year], 76 y.o.   MRN: [id number]  HPI  76 year old patient who is followed closely by endocrinology following I-131.  She is doing quite well on supplemental T3 and T4.  She has a history of essential hypertension as well as ADHD. She does monitor home blood pressure readings with occasional elevated readings. No cardiopulmonary complaints.  Valsartan has been discontinued and losartan substituted  Past Medical History:  Diagnosis Date  . ALLERGIC RHINITIS 11/30/2008  . ALLERGIC RHINITIS 11/30/2008  . ASTHMA UNSPECIFIED WITH EXACERBATION 02/17/2008  . CAROTID BRUIT, LEFT 04/19/2007  . Closed fracture of lateral malleolus 06/04/2009  . CLOSED FRACTURE OF METATARSAL BONE 06/04/2009  . Esophageal reflux 11/08/2008  . GANGLION CYST 04/19/2007  . HYPERLIPIDEMIA NEC/NOS 04/19/2007  . HYPERTENSION 04/07/2007  . INSOMNIA 08/07/2010  . OSTEOARTHRITIS 04/07/2007  . OSTEOPENIA 03/10/2008  . UTI 07/20/2009     Social History   Social History  . Marital status: Married    Spouse name: N/A  . Number of children: N/A  . Years of education: N/A   Occupational History  . retired Retired   Social History Main Topics  . Smoking status: Former Smoker    Packs/day: 1.50    Years: 20.00    Types: Cigarettes    Quit date: 09/15/1968  . Smokeless tobacco: Never Used     Comment: smoked 1.5 for  20 years  . Alcohol use No  . Drug use: No  . Sexual activity: Yes   Other Topics Concern  . Not on file   Social History Narrative   Married for 50 years. She has 1 son that age 78   Retired from Environmental [employer]          Past Surgical History:  Procedure Laterality Date  . knuckle replacement    . TOTAL HIP ARTHROPLASTY    . TUBAL LIGATION      Family History  Problem Relation Age of Onset  . Heart disease Mother        Mother also has significant carotid artery stenosis  . COPD Father   . Alcoholism Father   .  Cancer Brother   . Alzheimer's disease Sister   . Thyroid disease Sister        Goiter  . Breast cancer Neg Hx     Allergies  Allergen Reactions  . Cephalexin Other (See Comments)    REACTION IS UNKNOWN    Current Outpatient Prescriptions on File Prior to Visit  Medication Sig Dispense Refill  . aspirin-acetaminophen-caffeine (EXCEDRIN MIGRAINE) 250-250-65 MG per tablet Take 1 tablet by mouth every 6 (six) hours as needed. (Patient taking differently: Take 1 tablet by mouth daily as needed for headache or migraine. ) 30 tablet 0  . buPROPion (WELLBUTRIN XL) 300 MG 24 hr tablet TAKE ONE TABLET BY MOUTH ONCE DAILY 90 tablet 1  . calcium carbonate (TUMS - DOSED IN MG ELEMENTAL CALCIUM) 500 MG chewable tablet Chew 1-2 tablets by mouth 2 (two) times daily.     Marland Kitchen conjugated estrogens (PREMARIN) vaginal cream Place 1 Applicatorful vaginally daily. Apply twice per week 42.5 g 12  . Cyanocobalamin (B-12 PO) Take 1 tablet by mouth daily. Reported on 01/21/2016    . FLUoxetine (PROZAC) 10 MG capsule TAKE 3 CAPSULES(30 MG) BY MOUTH DAILY 270 capsule 3  . glucosamine-chondroitin 500-400 MG tablet Take 1 tablet by mouth 3 (three) times daily.    Marland Kitchen  levothyroxine (SYNTHROID) 112 MCG tablet Take 1 tablet (112 mcg total) by mouth daily before breakfast. 30 tablet 3  . liothyronine (CYTOMEL) 5 MCG tablet Take 1 tablet (5 mcg total) by mouth daily. 30 tablet 3  . losartan-hydrochlorothiazide (HYZAAR) 100-25 MG tablet Take 1 tablet by mouth daily. 90 tablet 3  . Melatonin 3 MG TABS Take 3 mg by mouth at bedtime. Reported on 01/21/2016    . Multiple Vitamin (MULTIVITAMIN) capsule Take 1 capsule by mouth daily.       No current facility-administered medications on file prior to visit.     BP (!) 142/80 (BP Location: Left Arm, Patient Position: Sitting, Cuff Size: Normal)   Pulse 86   Temp 98 F (36.7 C) (Oral)   Ht 5\' 2"  (1.575 m)   Wt 183 lb 3.2 oz (83.1 kg)   BMI 33.51 kg/m     Review of Systems    Constitutional: Negative.   HENT: Negative for congestion, dental problem, hearing loss, rhinorrhea, sinus pressure, sore throat and tinnitus.   Eyes: Negative for pain, discharge and visual disturbance.  Respiratory: Negative for cough and shortness of breath.   Cardiovascular: Negative for chest pain, palpitations and leg swelling.  Gastrointestinal: Negative for abdominal distention, abdominal pain, blood in stool, constipation, diarrhea, nausea and vomiting.  Genitourinary: Negative for difficulty urinating, dysuria, flank pain, frequency, hematuria, pelvic pain, urgency, vaginal bleeding, vaginal discharge and vaginal pain.  Musculoskeletal: Negative for arthralgias, gait problem and joint swelling.  Skin: Negative for rash.  Neurological: Negative for dizziness, syncope, speech difficulty, weakness, numbness and headaches.  Hematological: Negative for adenopathy.  Psychiatric/Behavioral: Positive for decreased concentration. Negative for agitation, behavioral problems and dysphoric mood. The patient is not nervous/anxious.        Objective:   Physical Exam  Constitutional: She is oriented to person, place, and time. She appears well-developed and well-nourished.  Blood pressure 140/80  HENT:  Head: Normocephalic.  Right Ear: External ear normal.  Left Ear: External ear normal.  Mouth/Throat: Oropharynx is clear and moist.  Eyes: Pupils are equal, round, and reactive to light. Conjunctivae and EOM are normal.  Neck: Normal range of motion. Neck supple. No thyromegaly present.  Cardiovascular: Normal rate, regular rhythm, normal heart sounds and intact distal pulses.   Pulmonary/Chest: Effort normal and breath sounds normal.  Abdominal: Soft. Bowel sounds are normal. She exhibits no mass. There is no tenderness.  Musculoskeletal: Normal range of motion.  Lymphadenopathy:    She has no cervical adenopathy.  Neurological: She is alert and oriented to person, place, and time.   Skin: Skin is warm and dry. No rash noted.  Psychiatric: She has a normal mood and affect. Her behavior is normal.          Assessment & Plan:   Essential hypertension.  Will continue home blood pressure monitoring.  Weight loss exercise encouraged.  Will place on a DASH diet.  Schedule CPX 3 months Hypothyroidism status post I-131.  Continue supplementation.  Endocrinology follow-up ADHD.  Medications refilled Obesity.  Weight loss encouraged  Schedule CPX  [employer] Pilar Plate

## 2017-05-08 NOTE — Patient Instructions (Addendum)
Limit your sodium (Salt) intake  Please check your blood pressure on a regular basis.  If it is consistently greater than 150/90, please make an office appointment.  You need to lose weight.  Consider a lower calorie diet and regular exercise.   DASH Eating Plan DASH stands for "Dietary Approaches to Stop Hypertension." The DASH eating plan is a healthy eating plan that has been shown to reduce high blood pressure (hypertension). It may also reduce your risk for type 2 diabetes, heart disease, and stroke. The DASH eating plan may also help with weight loss. What are tips for following this plan? General guidelines  Avoid eating more than 2,300 mg (milligrams) of salt (sodium) a day. If you have hypertension, you may need to reduce your sodium intake to 1,500 mg a day.  Limit alcohol intake to no more than 1 drink a day for nonpregnant women and 2 drinks a day for men. One drink equals 12 oz of beer, 5 oz of wine, or 1 oz of hard liquor.  Work with your health care provider to maintain a healthy body weight or to lose weight. Ask what an ideal weight is for you.  Get at least 30 minutes of exercise that causes your heart to beat faster (aerobic exercise) most days of the week. Activities may include walking, swimming, or biking.  Work with your health care provider or diet and nutrition specialist (dietitian) to adjust your eating plan to your individual calorie needs. Reading food labels  Check food labels for the amount of sodium per serving. Choose foods with less than 5 percent of the Daily Value of sodium. Generally, foods with less than 300 mg of sodium per serving fit into this eating plan.  To find whole grains, look for the word "whole" as the first word in the ingredient list. Shopping  Buy products labeled as "low-sodium" or "no salt added."  Buy fresh foods. Avoid canned foods and premade or frozen meals. Cooking  Avoid adding salt when cooking. Use salt-free seasonings  or herbs instead of table salt or sea salt. Check with your health care provider or pharmacist before using salt substitutes.  Do not fry foods. Cook foods using healthy methods such as baking, boiling, grilling, and broiling instead.  Cook with heart-healthy oils, such as olive, canola, soybean, or sunflower oil. Meal planning   Eat a balanced diet that includes: ? 5 or more servings of fruits and vegetables each day. At each meal, try to fill half of your plate with fruits and vegetables. ? Up to 6-8 servings of whole grains each day. ? Less than 6 oz of lean meat, poultry, or fish each day. A 3-oz serving of meat is about the same size as a deck of cards. One egg equals 1 oz. ? 2 servings of low-fat dairy each day. ? A serving of nuts, seeds, or beans 5 times each week. ? Heart-healthy fats. Healthy fats called Omega-3 fatty acids are found in foods such as flaxseeds and coldwater fish, like sardines, salmon, and mackerel.  Limit how much you eat of the following: ? Canned or prepackaged foods. ? Food that is high in trans fat, such as fried foods. ? Food that is high in saturated fat, such as fatty meat. ? Sweets, desserts, sugary drinks, and other foods with added sugar. ? Full-fat dairy products.  Do not salt foods before eating.  Try to eat at least 2 vegetarian meals each week.  Eat more home-cooked food and  less restaurant, buffet, and fast food.  When eating at a restaurant, ask that your food be prepared with less salt or no salt, if possible. What foods are recommended? The items listed may not be a complete list. Talk with your dietitian about what dietary choices are best for you. Grains Whole-grain or whole-wheat bread. Whole-grain or whole-wheat pasta. Brown rice. [place]. Bulgur. Whole-grain and low-sodium cereals. Pita bread. Low-fat, low-sodium crackers. Whole-wheat flour tortillas. Vegetables Fresh or frozen vegetables (raw, steamed, roasted, or  grilled). Low-sodium or reduced-sodium tomato and vegetable juice. Low-sodium or reduced-sodium tomato sauce and tomato paste. Low-sodium or reduced-sodium canned vegetables. Fruits All fresh, dried, or frozen fruit. Canned fruit in natural juice (without added sugar). Meat and other protein foods Skinless chicken or [place]. Ground chicken or [place]. Pork with fat trimmed off. Fish and seafood. Egg whites. Dried beans, peas, or lentils. Unsalted nuts, nut butters, and seeds. Unsalted canned beans. Lean cuts of beef with fat trimmed off. Low-sodium, lean deli meat. Dairy Low-fat (1%) or fat-free (skim) milk. Fat-free, low-fat, or reduced-fat cheeses. Nonfat, low-sodium ricotta or cottage cheese. Low-fat or nonfat yogurt. Low-fat, low-sodium cheese. Fats and oils Soft margarine without trans fats. Vegetable oil. Low-fat, reduced-fat, or light mayonnaise and salad dressings (reduced-sodium). Canola, safflower, olive, soybean, and sunflower oils. Avocado. Seasoning and other foods Herbs. Spices. Seasoning mixes without salt. Unsalted popcorn and pretzels. Fat-free sweets. What foods are not recommended? The items listed may not be a complete list. Talk with your dietitian about what dietary choices are best for you. Grains Baked goods made with fat, such as croissants, muffins, or some breads. Dry pasta or rice meal packs. Vegetables Creamed or fried vegetables. Vegetables in a cheese sauce. Regular canned vegetables (not low-sodium or reduced-sodium). Regular canned tomato sauce and paste (not low-sodium or reduced-sodium). Regular tomato and vegetable juice (not low-sodium or reduced-sodium). Angie Fava. Olives. Fruits Canned fruit in a light or heavy syrup. Fried fruit. Fruit in cream or butter sauce. Meat and other protein foods Fatty cuts of meat. Ribs. Fried meat. Berniece Salines. Sausage. Bologna and other processed lunch meats. Salami. Fatback. Hotdogs. Bratwurst. Salted nuts and seeds. Canned beans with  added salt. Canned or smoked fish. Whole eggs or egg yolks. Chicken or [place] with skin. Dairy Whole or 2% milk, cream, and half-and-half. Whole or full-fat cream cheese. Whole-fat or sweetened yogurt. Full-fat cheese. Nondairy creamers. Whipped toppings. Processed cheese and cheese spreads. Fats and oils Butter. Stick margarine. Lard. Shortening. Ghee. Bacon fat. Tropical oils, such as coconut, palm kernel, or palm oil. Seasoning and other foods Salted popcorn and pretzels. Onion salt, garlic salt, seasoned salt, table salt, and sea salt. Worcestershire sauce. Tartar sauce. Barbecue sauce. Teriyaki sauce. Soy sauce, including reduced-sodium. Steak sauce. Canned and packaged gravies. Fish sauce. Oyster sauce. Cocktail sauce. Horseradish that you find on the shelf. Ketchup. Mustard. Meat flavorings and tenderizers. Bouillon cubes. Hot sauce and Tabasco sauce. Premade or packaged marinades. Premade or packaged taco seasonings. Relishes. Regular salad dressings. Where to find more information:  National Heart, Lung, and [place]: https://wilson-eaton.com/  American Heart Association: www.heart.org Summary  The DASH eating plan is a healthy eating plan that has been shown to reduce high blood pressure (hypertension). It may also reduce your risk for type 2 diabetes, heart disease, and stroke.  With the DASH eating plan, you should limit salt (sodium) intake to 2,300 mg a day. If you have hypertension, you may need to reduce your sodium intake to 1,500 mg a day.  When on the DASH eating plan, aim to eat more fresh fruits and vegetables, whole grains, lean proteins, low-fat dairy, and heart-healthy fats.  Work with your health care provider or diet and nutrition specialist (dietitian) to adjust your eating plan to your individual calorie needs. This information is not intended to replace advice given to you by your health care provider. Make sure you discuss any questions you have with your health care  provider. Document Released: 08/21/2011 Document Revised: 08/25/2016 Document Reviewed: 08/25/2016 Elsevier Interactive Patient Education  2017 [employer].

## 2017-05-22 ENCOUNTER — Ambulatory Visit: Payer: Self-pay | Admitting: Internal Medicine

## 2017-05-26 DIAGNOSIS — M75111 Incomplete rotator cuff tear or rupture of right shoulder, not specified as traumatic: Secondary | ICD-10-CM | POA: Diagnosis not present

## 2017-05-26 DIAGNOSIS — M19012 Primary osteoarthritis, left shoulder: Secondary | ICD-10-CM | POA: Diagnosis not present

## 2017-06-02 ENCOUNTER — Telehealth (HOSPITAL_COMMUNITY): Payer: Self-pay | Admitting: Internal Medicine

## 2017-06-02 NOTE — Telephone Encounter (Signed)
06/02/17 pt left a message to say that she has eval scheduled on 9/19 and will be on vacation 9/28 thru 10/5 and should she wait or just come on for eval appt.  I called her back and left her a message to say that it was entirely up to her... If she came before she would have exercises to do at home.

## 2017-06-03 ENCOUNTER — Telehealth (HOSPITAL_COMMUNITY): Payer: Self-pay

## 2017-06-03 ENCOUNTER — Ambulatory Visit (HOSPITAL_COMMUNITY): Payer: Medicare Other

## 2017-06-03 ENCOUNTER — Encounter (HOSPITAL_COMMUNITY): Payer: Self-pay

## 2017-06-03 NOTE — Telephone Encounter (Signed)
Patient is going on vacation and will call us when she retrun to reschedule her appt

## 2017-06-04 ENCOUNTER — Encounter: Payer: Self-pay | Admitting: Internal Medicine

## 2017-06-15 ENCOUNTER — Ambulatory Visit: Payer: Self-pay | Admitting: Endocrinology

## 2017-06-26 DIAGNOSIS — E890 Postprocedural hypothyroidism: Secondary | ICD-10-CM | POA: Diagnosis not present

## 2017-06-26 LAB — TSH: TSH: 1.59 m[IU]/L (ref 0.40–4.50)

## 2017-06-26 LAB — T4, FREE: FREE T4: 1.3 ng/dL (ref 0.8–1.8)

## 2017-06-26 LAB — T3, FREE: T3, Free: 3.4 pg/mL (ref 2.3–4.2)

## 2017-06-30 ENCOUNTER — Ambulatory Visit: Payer: Self-pay | Admitting: Endocrinology

## 2017-07-01 ENCOUNTER — Ambulatory Visit (HOSPITAL_COMMUNITY): Payer: Medicare Other

## 2017-07-02 ENCOUNTER — Encounter: Payer: Self-pay | Admitting: Endocrinology

## 2017-07-02 ENCOUNTER — Ambulatory Visit (INDEPENDENT_AMBULATORY_CARE_PROVIDER_SITE_OTHER): Payer: Medicare Other | Admitting: Endocrinology

## 2017-07-02 VITALS — BP 128/84 | HR 124 | Ht 62.0 in | Wt 183.0 lb

## 2017-07-02 DIAGNOSIS — Z23 Encounter for immunization: Secondary | ICD-10-CM | POA: Diagnosis not present

## 2017-07-02 DIAGNOSIS — E890 Postprocedural hypothyroidism: Secondary | ICD-10-CM

## 2017-07-02 NOTE — Progress Notes (Signed)
Patient ID: [name], female   DOB: 1941/03/06, 76 y.o.   MRN: [id number]    Reason for Appointment:  Follow-up of thyroid     History of Present Illness:   Previous history: She had a low TSH level as far back as 7 years ago She had symptoms of feeling hot and sweaty with physical activity, some fatigue and palpitations She has had long history of anxiety and depression   Because of her symptoms and TSH in the hyperthyroid range as well as uptake of 34% she was given I-131 treatment with 30 mCi on 09/27/14. Subsequently she was subjectively better When TSH  was  as high as 15.9 she was started on 75 g levothyroxine in 12/2014  Recent history:  She had progressive increase in her levothyroxine dose since 2017 TSH was 5.4 in 5/17 and because of symptoms of fatigue her dose was increased to 100 g The dose was increased further in 11/17 and also in 2/18, was eventually taking 137 g  On her visit in 7/18 she was complaining of feeling consistently tired although has an occasional day where she feels better She also thinks she is losing hair, no cold intolerance   Since she had a relatively low  T3 level  she was switched from levothyroxine 137 to the Cytomel 5 g along with levothyroxine 112 g in July She is very compliant with taking her supplements before breakfast consistently in the morning  With her new regimen she says that she does feel less tired overall and maybe her hair loss is not as much Overall she thinks she is benefiting from the combination compared to levothyroxine alone even though her TSH is now 1.6 compared to low normal levels previously  Wt Readings from Last 3 Encounters:  07/02/17 183 lb (83 kg)  05/08/17 183 lb 3.2 oz (83.1 kg)  04/13/17 182 lb (82.6 kg)     Lab Results  Component Value Date   TSH 1.59 06/26/2017   TSH 0.36 (L) 04/10/2017   TSH 0.35 (L) 03/02/2017   FREET4 1.3 06/26/2017   FREET4 1.8 04/10/2017   FREET4 1.7  03/02/2017    Lab Results  Component Value Date   T3FREE 3.4 06/26/2017   T3FREE 2.8 04/10/2017   T3FREE 2.8 03/02/2017   T3FREE 2.7 11/04/2016      Allergies as of 07/02/2017      Reactions   Cephalexin Other (See Comments)   REACTION IS UNKNOWN      Medication List       Accurate as of 07/02/17  4:38 PM. Always use your most recent med list.          amphetamine-dextroamphetamine 30 MG tablet Commonly known as:  ADDERALL TAKE 30 MG BY MOUTH IN AM AND 30 MG IN PM   aspirin-acetaminophen-caffeine 250-250-65 MG tablet Commonly known as:  EXCEDRIN MIGRAINE Take 1 tablet by mouth every 6 (six) hours as needed.   B-12 PO Take 1 tablet by mouth daily. Reported on 01/21/2016   buPROPion 300 MG 24 hr tablet Commonly known as:  WELLBUTRIN XL TAKE ONE TABLET BY MOUTH ONCE DAILY   calcium carbonate 500 MG chewable tablet Commonly known as:  TUMS - dosed in mg elemental calcium Chew 1-2 tablets by mouth 2 (two) times daily.   conjugated estrogens vaginal cream Commonly known as:  PREMARIN Place 1 Applicatorful vaginally daily. Apply twice per week   diclofenac sodium 1 % Gel Commonly known as:  VOLTAREN  APPLY   TOPICALLY TO AFFECTED AREA 4 TIMES DAILY AS NEEDED FOR PAIN   FLUoxetine 10 MG capsule Commonly known as:  PROZAC TAKE 3 CAPSULES(30 MG) BY MOUTH DAILY   glucosamine-chondroitin 500-400 MG tablet Take 1 tablet by mouth 3 (three) times daily.   levothyroxine 112 MCG tablet Commonly known as:  SYNTHROID Take 1 tablet (112 mcg total) by mouth daily before breakfast.   liothyronine 5 MCG tablet Commonly known as:  CYTOMEL Take 1 tablet (5 mcg total) by mouth daily.   losartan-hydrochlorothiazide 100-25 MG tablet Commonly known as:  HYZAAR Take 1 tablet by mouth daily.   Melatonin 3 MG Tabs Take 3 mg by mouth at bedtime. Reported on 01/21/2016   multivitamin capsule Take 1 capsule by mouth daily.   ranitidine 150 MG tablet Commonly known as:   ZANTAC TAKE ONE TABLET BY MOUTH TWICE DAILY AS NEEDED FOR  HEARTBURN       Allergies:  Allergies  Allergen Reactions  . Cephalexin Other (See Comments)    REACTION IS UNKNOWN    Past Medical History:  Diagnosis Date  . ALLERGIC RHINITIS 11/30/2008  . ALLERGIC RHINITIS 11/30/2008  . ASTHMA UNSPECIFIED WITH EXACERBATION 02/17/2008  . CAROTID BRUIT, LEFT 04/19/2007  . Closed fracture of lateral malleolus 06/04/2009  . CLOSED FRACTURE OF METATARSAL BONE 06/04/2009  . Esophageal reflux 11/08/2008  . GANGLION CYST 04/19/2007  . HYPERLIPIDEMIA NEC/NOS 04/19/2007  . HYPERTENSION 04/07/2007  . INSOMNIA 08/07/2010  . OSTEOARTHRITIS 04/07/2007  . OSTEOPENIA 03/10/2008  . UTI 07/20/2009    Past Surgical History:  Procedure Laterality Date  . knuckle replacement    . TOTAL HIP ARTHROPLASTY    . TUBAL LIGATION      Family History  Problem Relation Age of Onset  . Heart disease Mother        Mother also has significant carotid artery stenosis  . COPD Father   . Alcoholism Father   . Cancer Brother   . Alzheimer's disease Sister   . Thyroid disease Sister        Goiter  . Breast cancer Neg Hx     Social History:  reports that she quit smoking about 48 years ago. Her smoking use included Cigarettes. She has a 30.00 pack-year smoking history. She has never used smokeless tobacco. She reports that she does not drink alcohol or use drugs.   Review of Systems:   ROS   Has complaints of late insomnia, Treated with melatonin OTC  She is on multiple treatment for depression/ADHD but she is  wanting to see a psychiatrist  Followed by PCP for hypertension  Blood pressure readings:   BP Readings from Last 3 Encounters:  07/02/17 128/84  05/08/17 (!) 142/80  04/13/17 114/74    Examination:   BP 128/84   Pulse (!) 124   Ht 5\' 2"  (1.575 m)   Wt 183 lb (83 kg)   SpO2 90%   BMI 33.47 kg/m    Thyroid not palpable Biceps reflexes appear normal   Assessment/Plan:    She has been on  Levothyroxine supplementation for post ablative hypothyroidism, baseline TSH 16  Now with taking  112 g of levothyroxine and 5 g of Cytomel she is subjectively feeling better with less fatigue and better outlook TSH is normal and her free T3 level is improved No goiter on exam  She may be getting a little less hair loss also She does want to continue this regimen  She will follow-up in 4  months with repeat labs  She will also discuss her medications for depression with psychiatrist, given her a couple of names  Follow-up in 2 months  total visit time 15 minutes  There are no Patient Instructions on file for this visit.    influenza vaccine given   [facility] 07/02/2017

## 2017-07-08 ENCOUNTER — Encounter (HOSPITAL_COMMUNITY): Payer: Self-pay | Admitting: Occupational Therapy

## 2017-07-08 ENCOUNTER — Ambulatory Visit (HOSPITAL_COMMUNITY): Payer: Medicare Other | Attending: Orthopedic Surgery | Admitting: Occupational Therapy

## 2017-07-08 DIAGNOSIS — M25612 Stiffness of left shoulder, not elsewhere classified: Secondary | ICD-10-CM | POA: Diagnosis not present

## 2017-07-08 DIAGNOSIS — M25512 Pain in left shoulder: Secondary | ICD-10-CM | POA: Insufficient documentation

## 2017-07-08 DIAGNOSIS — G8929 Other chronic pain: Secondary | ICD-10-CM | POA: Insufficient documentation

## 2017-07-08 DIAGNOSIS — R29898 Other symptoms and signs involving the musculoskeletal system: Secondary | ICD-10-CM | POA: Insufficient documentation

## 2017-07-08 DIAGNOSIS — M25511 Pain in right shoulder: Secondary | ICD-10-CM | POA: Insufficient documentation

## 2017-07-08 DIAGNOSIS — M25611 Stiffness of right shoulder, not elsewhere classified: Secondary | ICD-10-CM | POA: Diagnosis not present

## 2017-07-08 NOTE — Patient Instructions (Signed)
SHOULDER: Flexion On Table   Place hands on table, elbows straight. Move hips away from body. Press hands down into table.  _10__ reps per set, _2__ sets per day  Abduction (Passive)   With arm out to side, resting on table, lower head toward arm, keeping trunk away from table.  Repeat __10__ times. Do _2___ sessions per day.  Copyright  VHI. All rights reserved.     Internal Rotation (Assistive)   Seated with elbow bent at right angle and held against side, slide arm on table surface in an inward arc. Repeat __10__ times. Do __2__ sessions per day. Activity: Use this motion to brush crumbs off the table.  Copyright  VHI. All rights reserved.      1) Flexion Wall Stretch    Face wall, place affected handon wall in front of you. Slide hand up the wall  and lean body in towards the wall. Hold for 10 seconds. Repeat 3-5 times. 1-2 times/day.     2) Towel Stretch with Internal Rotation   Or     Gently pull up (or to the side) your affected arm  behind your back with the assist of a towel. Hold 10 seconds, repeat 3-5 times. 1-2 times/day.             3) Corner Stretch    Stand at a corner of a wall, place your arms on the walls with elbows bent. Lean into the corner until a stretch is felt along the front of your chest and/or shoulders. Hold for 10 seconds. Repeat 3-5X, 1-2 times/day.    4) Posterior Capsule Stretch    Bring the involved arm across chest. Grasp elbow and pull toward chest until you feel a stretch in the back of the upper arm and shoulder. Hold 10 seconds. Repeat 3-5X. Complete 1-2 times/day.    5) Scapular Retraction    Tuck chin back as you pinch shoulder blades together.  Hold 5 seconds. Repeat 3-5X. Complete 1-2 times/day.    6) External Rotation Stretch:     Place your affected hand on the wall with the elbow bent and gently turn your body the opposite direction until a stretch is felt. Hold 10 seconds, repeat 3-5X. Complete  1-2 times/day.   OR    Standing in an open doorway, place your arm on the edge of the doorway with the shoulder at 90 degrees from the side and elbow bent to 90 degrees. Lean forward until you feel a stretch on the front of your shoulder. Keep neck relaxed. Hold 10 seconds, repeat 3-5X. Complete 1-2 times/day

## 2017-07-08 NOTE — Therapy (Addendum)
[place], [place], [address] Phone: [phone]   Fax:  [phone]  Occupational Therapy Evaluation  Patient Details  Name: TORRANCE FRECH MRN: [id number] Date of Birth: 06-Sep-1941 Referring Provider: Dr. Elsie Saas  Encounter Date: 07/08/2017      OT End of Session - 07/08/17 1441    Visit Number 1   Number of Visits 8   Date for OT Re-Evaluation 08/07/17   Authorization Type 1) Medicare A & B 2) BCBS   Authorization Time Period Before 10th visit   Authorization - Visit Number 1   Authorization - Number of Visits 10   OT Start Time 0355   OT Stop Time 1430   OT Time Calculation (min) 38 min   Activity Tolerance Patient tolerated treatment well   Behavior During Therapy [facility] for tasks assessed/performed      Past Medical History:  Diagnosis Date  . ALLERGIC RHINITIS 11/30/2008  . ALLERGIC RHINITIS 11/30/2008  . ASTHMA UNSPECIFIED WITH EXACERBATION 02/17/2008  . CAROTID BRUIT, LEFT 04/19/2007  . Closed fracture of lateral malleolus 06/04/2009  . CLOSED FRACTURE OF METATARSAL BONE 06/04/2009  . Esophageal reflux 11/08/2008  . GANGLION CYST 04/19/2007  . HYPERLIPIDEMIA NEC/NOS 04/19/2007  . HYPERTENSION 04/07/2007  . INSOMNIA 08/07/2010  . OSTEOARTHRITIS 04/07/2007  . OSTEOPENIA 03/10/2008  . UTI 07/20/2009    Past Surgical History:  Procedure Laterality Date  . knuckle replacement    . TOTAL HIP ARTHROPLASTY    . TUBAL LIGATION      There were no vitals filed for this visit.      Subjective Assessment - 07/08/17 1439    Subjective  S: I've fallen twice and both times landed with my arms stretched out.    Pertinent History Pt is a 76 y/o female presenting with bilateral shoulder pain for approximately 1 year after a fall. Pt reports she is continuing to use her arms as much as possible. Dr. Elsie Saas referred pt to occupational therapy for evaluation and treatment.    Special Tests FOTO Score:  46/100 (54% impairment)   Patient Stated Goals To have less pain and more mobility in my arms.    Currently in Pain? Yes   Pain Score 2    Pain Location Shoulder   Pain Orientation Right;Left   Pain Descriptors / Indicators Aching;Sore   Pain Type Chronic pain   Pain Onset More than a month ago   Pain Frequency Intermittent   Aggravating Factors  overhead reaching   Pain Relieving Factors rest   Effect of Pain on Daily Activities mod effect on ADL completion           [facility] OT Assessment - 07/08/17 1352      Assessment   Diagnosis Bilateral shoulder RT tendonitis and arthritis   Referring Provider Dr. Elsie Saas   Onset Date --  approximately 1 year ago   Prior Therapy None     Precautions   Precautions Shoulder   Type of Shoulder Precautions Progress as tolerated     Restrictions   Weight Bearing Restrictions No     Balance Screen   Has the patient fallen in the past 6 months Yes   How many times? 1   Has the patient had a decrease in activity level because of a fear of falling?  No   Is the patient reluctant to leave their home because of a fear of falling?  No  Prior Function   Level of Independence Independent   Vocation Retired   Leisure Working on [employer], gardening/outdoor work     ADL   ADL comments Pt is having difficulty with dressing-shirts and bras, reaching back for the seatbelt, reaching into the refrigerator above waist/should level, bathing, lifting objects.      Written Expression   Dominant Hand Right     Cognition   Overall Cognitive Status Within Functional Limits for tasks assessed     ROM / Strength   AROM / PROM / Strength AROM;PROM;Strength     Palpation   Palpation comment Mod/max fascial restrictions along bilateral upper arms, trapezius, and scapularis regions     AROM   Overall AROM Comments Assessed seated, er/IR adducted   AROM Assessment Site Shoulder   Right/Left Shoulder Right;Left   Right Shoulder Flexion 130  Degrees   Right Shoulder ABduction 131 Degrees   Right Shoulder Internal Rotation 90 Degrees   Right Shoulder External Rotation 50 Degrees   Left Shoulder Flexion 135 Degrees   Left Shoulder ABduction 76 Degrees   Left Shoulder Internal Rotation 90 Degrees   Left Shoulder External Rotation 44 Degrees     PROM   Overall PROM Comments Assessed supine, er/IR adducted   PROM Assessment Site Shoulder   Right/Left Shoulder Right;Left   Right Shoulder Flexion 150 Degrees   Right Shoulder ABduction 180 Degrees   Right Shoulder Internal Rotation 90 Degrees   Right Shoulder External Rotation 82 Degrees   Left Shoulder Flexion 134 Degrees   Left Shoulder ABduction 96 Degrees   Left Shoulder Internal Rotation 90 Degrees   Left Shoulder External Rotation 45 Degrees     Strength   Overall Strength Comments Assessed seated, er/IR adducted   Strength Assessment Site Shoulder   Right/Left Shoulder Right;Left   Right Shoulder Flexion 4/5   Right Shoulder ABduction 4/5   Right Shoulder Internal Rotation 4/5   Right Shoulder External Rotation 4-/5   Left Shoulder Flexion 4-/5   Left Shoulder ABduction 4-/5   Left Shoulder Internal Rotation 4-/5   Left Shoulder External Rotation 3+/5                         OT Education - 07/08/17 1436    Education provided Yes   Education Details table slides for LUE; shoulder stretches for RUE   Person(s) Educated Patient   Methods Explanation;Demonstration;Handout   Comprehension Verbalized understanding;Returned demonstration          OT Short Term Goals - 07/08/17 1446      OT SHORT TERM GOAL #1   Title Pt will be provided with and educated on HEP for improved mobility in BUE.    Time 4   Period Weeks   Status New   Target Date 08/07/17     OT SHORT TERM GOAL #2   Title Pt will decrease BUE fascial restrictions from mod/max to minimal amounts or less to improve mobility required for functional reaching.    Time 4   Period  Weeks   Status New     OT SHORT TERM GOAL #3   Title Pt will decrease pain in BUE to 3/10 or less to improve completion of ADL tasks with minimal compensatory strategies.    Time 4   Period Weeks   Status New     OT SHORT TERM GOAL #4   Title Pt will improve BUE ROM to [facility] to improve ability  to reach behind back for seatbelts and to fasten bra.    Time 4   Period Weeks   Status New     OT SHORT TERM GOAL #5   Title Pt will improve BUE strength to 4+/5 to increase ability to complete yardwork and housework tasks.    Time 4   Period Weeks   Status New                  Plan - July 17, 2017 1441    Clinical Impression Statement A: Pt is a 76 y/o female presenting with bilateral shoulder pain limiting ability to complete functional tasks. Pt has greater ROM and strength in the RUE compared to the LUE, has functional deficits in BUE. Pt provided with HEP this session.    Occupational Profile and client history currently impacting functional performance Pt is independent at baseline and is motivated to return to PLOF using BUE with minimal discomfort   Occupational performance deficits (Please refer to evaluation for details): ADL's;IADL's;Rest and Sleep;Leisure;Social Participation   Rehab Potential Good   OT Frequency 2x / week   OT Duration 4 weeks   OT Treatment/Interventions Self-care/ADL training;Therapeutic exercise;Patient/family education;Manual Therapy;Ultrasound;Therapeutic activities;Cryotherapy;Electrical Stimulation;Passive range of motion;Moist Heat   Plan P: Pt will benefit from skilled OT services to decrease pain and fascial restrictions and increase ROM, strength, and functional task completion with BUE. Treatment plan: myofascial release, manual therapy, P/ROM, AA/ROM, A/ROM, general BUE strengthening, scapular mobility and strengthening, modalities prn   Clinical Decision Making Limited treatment options, no task modification necessary   OT Home Exercise Plan  07/18/2023: table slides, shoulder stretches   Consulted and Agree with Plan of Care Patient      Patient will benefit from skilled therapeutic intervention in order to improve the following deficits and impairments:  Impaired flexibility, Decreased strength, Decreased activity tolerance, Pain, Impaired UE functional use, Increased fascial restricitons, Decreased range of motion  Visit Diagnosis: Chronic right shoulder pain  Acute pain of left shoulder  Stiffness of right shoulder, not elsewhere classified  Stiffness of left shoulder, not elsewhere classified  Other symptoms and signs involving the musculoskeletal system      G-Codes - 07-17-2017 1450    Functional Assessment Tool Used (Outpatient only) clinical judgement   Functional Limitation Carrying, moving and handling objects   Carrying, Moving and Handling Objects Current Status (M1962) At least 40 percent but less than 60 percent impaired, limited or restricted   Carrying, Moving and Handling Objects Goal Status (I2979) At least 1 percent but less than 20 percent impaired, limited or restricted      Problem List Patient Active Problem List   Diagnosis Date Noted  . Hypothyroidism, postop 11/19/2016  . Cyst in hand 08/23/2014  . Preventative health care 07/10/2014  . Attention deficit disorder 07/10/2014  . Obesity (BMI 30-39.9) 12/26/2013  . GERD (gastroesophageal reflux disease) 11/17/2012  . Depression with anxiety 10/28/2010  . INSOMNIA 08/07/2010  . ALLERGIC RHINITIS 11/30/2008  . OSTEOPENIA 03/10/2008  . Hyperlipidemia 04/19/2007  . Left carotid bruit 04/19/2007  . Essential hypertension 04/07/2007  . Osteoarthritis 04/07/2007   Guadelupe Sabin, OTR/L  [id number] Jul 17, 2017, 2:51 PM  [place] [address] [place], [place], [address] Phone: [phone]   Fax:  [phone]  Name: DAJSHA MASSARO MRN: [id number] Date of Birth: [birth year]

## 2017-07-14 ENCOUNTER — Other Ambulatory Visit: Payer: Self-pay | Admitting: Internal Medicine

## 2017-07-14 ENCOUNTER — Ambulatory Visit (HOSPITAL_COMMUNITY): Payer: Medicare Other | Admitting: Specialist

## 2017-07-14 ENCOUNTER — Encounter (HOSPITAL_COMMUNITY): Payer: Self-pay | Admitting: Specialist

## 2017-07-14 DIAGNOSIS — G8929 Other chronic pain: Secondary | ICD-10-CM | POA: Diagnosis not present

## 2017-07-14 DIAGNOSIS — M25611 Stiffness of right shoulder, not elsewhere classified: Secondary | ICD-10-CM | POA: Diagnosis not present

## 2017-07-14 DIAGNOSIS — M25512 Pain in left shoulder: Secondary | ICD-10-CM

## 2017-07-14 DIAGNOSIS — M25511 Pain in right shoulder: Secondary | ICD-10-CM | POA: Diagnosis not present

## 2017-07-14 DIAGNOSIS — M25612 Stiffness of left shoulder, not elsewhere classified: Secondary | ICD-10-CM

## 2017-07-14 DIAGNOSIS — F418 Other specified anxiety disorders: Secondary | ICD-10-CM

## 2017-07-14 DIAGNOSIS — R29898 Other symptoms and signs involving the musculoskeletal system: Secondary | ICD-10-CM

## 2017-07-14 NOTE — Therapy (Signed)
[place], [place], [address] Phone: [phone]   Fax:  [phone]  Occupational Therapy Treatment  Patient Details  Name: [name] MRN: [id number] Date of Birth: [birth year] Referring Provider: Dr. Elsie Saas  Encounter Date: 07/14/2017      OT End of Session - 07/14/17 0941    Visit Number 2   Number of Visits 8   Date for OT Re-Evaluation 08/07/17   Authorization Type 1) Medicare A & B 2) BCBS   Authorization Time Period Before 10th visit   Authorization - Visit Number 2   Authorization - Number of Visits 10   OT Start Time 0822   OT Stop Time [id number]   OT Time Calculation (min) 64 min   Activity Tolerance Patient tolerated treatment well   Behavior During Therapy [facility] for tasks assessed/performed      Past Medical History:  Diagnosis Date  . ALLERGIC RHINITIS 11/30/2008  . ALLERGIC RHINITIS 11/30/2008  . ASTHMA UNSPECIFIED WITH EXACERBATION 02/17/2008  . CAROTID BRUIT, LEFT 04/19/2007  . Closed fracture of lateral malleolus 06/04/2009  . CLOSED FRACTURE OF METATARSAL BONE 06/04/2009  . Esophageal reflux 11/08/2008  . GANGLION CYST 04/19/2007  . HYPERLIPIDEMIA NEC/NOS 04/19/2007  . HYPERTENSION 04/07/2007  . INSOMNIA 08/07/2010  . OSTEOARTHRITIS 04/07/2007  . OSTEOPENIA 03/10/2008  . UTI 07/20/2009    Past Surgical History:  Procedure Laterality Date  . knuckle replacement    . TOTAL HIP ARTHROPLASTY    . TUBAL LIGATION      There were no vitals filed for this visit.      Subjective Assessment - 07/14/17 0939    Subjective  S:  I am not sure about the exercises, can we go over them?   Currently in Pain? Yes   Pain Score 4    Pain Location Shoulder   Pain Orientation Right;Left   Pain Descriptors / Indicators Aching;Sore   Pain Type Chronic pain   Pain Onset More than a month ago            [facility] OT Assessment - 07/14/17 0001      Assessment   Diagnosis Bilateral shoulder RT  tendonitis and arthritis     Precautions   Precautions Shoulder   Type of Shoulder Precautions Progress as tolerated                  OT Treatments/Exercises (OP) - 07/14/17 0001      Exercises   Exercises Shoulder     Shoulder Exercises: Supine   Protraction PROM;AAROM;10 reps;Both   Horizontal ABduction PROM;AAROM;Both;10 reps   External Rotation PROM;AAROM;Both;10 reps   Internal Rotation PROM;AAROM;10 reps;Both   Flexion PROM;AAROM;Both;10 reps   ABduction PROM;AAROM;10 reps;Both     Shoulder Exercises: Seated   Elevation AROM;Both;10 reps   Extension AROM;Both;10 reps   Retraction AROM;10 reps;Both     Shoulder Exercises: Pulleys   Flexion 2 minutes   ABduction 2 minutes     Shoulder Exercises: ROM/Strengthening   Thumb Tacks 1'   Prot/Ret//Elev/Dep 1'     Shoulder Exercises: Stretch   Corner Stretch 3 reps;10 seconds   Internal Rotation Stretch 3 reps   External Rotation Stretch 3 reps;10 seconds   Wall Stretch - Flexion 3 reps;10 seconds   Table Stretch - Flexion 3 reps;10 seconds   Table Stretch - Abduction 3 reps;10 seconds   Table Stretch - External Rotation 3 reps     Manual Therapy  Manual Therapy Myofascial release   Manual therapy comments manual therapy completed seperately from all other interventions   Myofascial Release myofascial release and manual stretching to bilateral upper arms, scapular, shoulder and cervical regions to decrease pain and fascial restrictions and improve pain free mobility                OT Education - 07/14/17 0940    Education provided Yes   Education Details reviewed plan of care and with patient and issued a copy.  reviewed HEP issued at initial evaluation and completed 5-10 repetitions of each exercise.   Person(s) Educated Patient   Methods Explanation;Demonstration;Handout   Comprehension Verbalized understanding;Returned demonstration          OT Short Term Goals - 07/14/17 0942      OT  SHORT TERM GOAL #1   Title Pt will be provided with and educated on HEP for improved mobility in BUE.    Time 4   Period Weeks   Status On-going     OT SHORT TERM GOAL #2   Title Pt will decrease BUE fascial restrictions from mod/max to minimal amounts or less to improve mobility required for functional reaching.    Time 4   Period Weeks   Status On-going     OT SHORT TERM GOAL #3   Title Pt will decrease pain in BUE to 3/10 or less to improve completion of ADL tasks with minimal compensatory strategies.    Time 4   Period Weeks   Status On-going     OT SHORT TERM GOAL #4   Title Pt will improve BUE ROM to [facility] to improve ability to reach behind back for seatbelts and to fasten bra.    Time 4   Period Weeks   Status On-going     OT SHORT TERM GOAL #5   Title Pt will improve BUE strength to 4+/5 to increase ability to complete yardwork and housework tasks.    Time 4   Period Weeks   Status On-going                  Plan - 07/14/17 0941    Clinical Impression Statement A:  Patient has [facility] P/ROM, began AA/ROM which is improving but painful.  Reviewed HEP as patient had several questions on technique.     Plan P:  Follow up on HEP, increase AA/ROM repetitions, add wall wash and therapy ball stretches.        Patient will benefit from skilled therapeutic intervention in order to improve the following deficits and impairments:  Impaired flexibility, Decreased strength, Decreased activity tolerance, Pain, Impaired UE functional use, Increased fascial restricitons, Decreased range of motion  Visit Diagnosis: Chronic right shoulder pain  Acute pain of left shoulder  Stiffness of left shoulder, not elsewhere classified  Other symptoms and signs involving the musculoskeletal system  Stiffness of right shoulder, not elsewhere classified    Problem List Patient Active Problem List   Diagnosis Date Noted  . Hypothyroidism, postop 11/19/2016  . Cyst in hand  08/23/2014  . Preventative health care 07/10/2014  . Attention deficit disorder 07/10/2014  . Obesity (BMI 30-39.9) 12/26/2013  . GERD (gastroesophageal reflux disease) 11/17/2012  . Depression with anxiety 10/28/2010  . INSOMNIA 08/07/2010  . ALLERGIC RHINITIS 11/30/2008  . OSTEOPENIA 03/10/2008  . Hyperlipidemia 04/19/2007  . Left carotid bruit 04/19/2007  . Essential hypertension 04/07/2007  . Osteoarthritis 04/07/2007    Vangie Bicker, [place], OTR/L [id number]  07/14/2017, 10:19 AM  [place] [address] [place], [place], [address] Phone: [phone]   Fax:  [phone]  Name: [name] MRN: [id number] Date of Birth: 02-05-1941

## 2017-07-17 ENCOUNTER — Encounter (HOSPITAL_COMMUNITY): Payer: Self-pay | Admitting: Occupational Therapy

## 2017-07-17 ENCOUNTER — Ambulatory Visit (HOSPITAL_COMMUNITY): Payer: Medicare Other | Attending: Orthopedic Surgery | Admitting: Occupational Therapy

## 2017-07-17 DIAGNOSIS — M25512 Pain in left shoulder: Secondary | ICD-10-CM

## 2017-07-17 DIAGNOSIS — M25611 Stiffness of right shoulder, not elsewhere classified: Secondary | ICD-10-CM | POA: Diagnosis not present

## 2017-07-17 DIAGNOSIS — G8929 Other chronic pain: Secondary | ICD-10-CM

## 2017-07-17 DIAGNOSIS — R29898 Other symptoms and signs involving the musculoskeletal system: Secondary | ICD-10-CM

## 2017-07-17 DIAGNOSIS — M25612 Stiffness of left shoulder, not elsewhere classified: Secondary | ICD-10-CM | POA: Diagnosis not present

## 2017-07-17 DIAGNOSIS — M25511 Pain in right shoulder: Secondary | ICD-10-CM | POA: Insufficient documentation

## 2017-07-17 NOTE — Therapy (Signed)
[place], [place], [address] Phone: [phone]   Fax:  [phone]  Occupational Therapy Treatment  Patient Details  Name: [name] MRN: [id number] Date of Birth: [birth year] Referring Provider: Dr. Elsie Saas  Encounter Date: 07/17/2017      OT End of Session - 07/17/17 0951    Visit Number 3   Number of Visits 8   Date for OT Re-Evaluation 08/07/17   Authorization Type 1) Medicare A & B 2) BCBS   Authorization Time Period Before 10th visit   Authorization - Visit Number 3   Authorization - Number of Visits 10   OT Start Time 0900   OT Stop Time 0947   OT Time Calculation (min) 47 min   Activity Tolerance Patient tolerated treatment well   Behavior During Therapy [facility] for tasks assessed/performed      Past Medical History:  Diagnosis Date  . ALLERGIC RHINITIS 11/30/2008  . ALLERGIC RHINITIS 11/30/2008  . ASTHMA UNSPECIFIED WITH EXACERBATION 02/17/2008  . CAROTID BRUIT, LEFT 04/19/2007  . Closed fracture of lateral malleolus 06/04/2009  . CLOSED FRACTURE OF METATARSAL BONE 06/04/2009  . Esophageal reflux 11/08/2008  . GANGLION CYST 04/19/2007  . HYPERLIPIDEMIA NEC/NOS 04/19/2007  . HYPERTENSION 04/07/2007  . INSOMNIA 08/07/2010  . OSTEOARTHRITIS 04/07/2007  . OSTEOPENIA 03/10/2008  . UTI 07/20/2009    Past Surgical History:  Procedure Laterality Date  . knuckle replacement    . TOTAL HIP ARTHROPLASTY    . TUBAL LIGATION      There were no vitals filed for this visit.      Subjective Assessment - 07/17/17 0859    Subjective  S: I woke up this morning and I was so stiff I couldn't hardly move it.    Currently in Pain? Yes   Pain Score 4    Pain Location Shoulder   Pain Orientation Right;Left   Pain Descriptors / Indicators Aching;Sore   Pain Type Chronic pain   Pain Radiating Towards none   Pain Onset More than a month ago   Pain Frequency Intermittent   Aggravating Factors  overhead  reaching   Pain Relieving Factors rest   Effect of Pain on Daily Activities mod effect on ADL completion   Multiple Pain Sites No            [facility] OT Assessment - 07/17/17 0858      Assessment   Diagnosis Bilateral shoulder RT tendonitis and arthritis     Precautions   Precautions Shoulder   Type of Shoulder Precautions Progress as tolerated                  OT Treatments/Exercises (OP) - 07/17/17 0859      Exercises   Exercises Shoulder     Shoulder Exercises: Supine   Protraction PROM;AAROM;10 reps;Both   Horizontal ABduction PROM;Both;10 reps;AROM;Right   Horizontal ABduction Limitations Unable to complete AA/ROM with LUE   External Rotation PROM;AAROM;Both;10 reps   Internal Rotation PROM;AAROM;10 reps;Both   Flexion PROM;10 reps;Both;AROM;Right   Flexion Limitations Unable to tolerate AA/ROM with LUE   ABduction PROM;AAROM;10 reps;Both   ABduction Limitations OT assist for proper form     Shoulder Exercises: Seated   Elevation AROM;Both;10 reps   Extension AROM;Both;10 reps   Retraction AROM;10 reps;Both     Manual Therapy   Manual Therapy Myofascial release   Manual therapy comments manual therapy completed seperately from all other interventions   Myofascial Release myofascial  release and manual stretching to bilateral upper arms, scapular, shoulder and cervical regions to decrease pain and fascial restrictions and improve pain free mobility                  OT Short Term Goals - 07/14/17 0942      OT SHORT TERM GOAL #1   Title Pt will be provided with and educated on HEP for improved mobility in BUE.    Time 4   Period Weeks   Status On-going     OT SHORT TERM GOAL #2   Title Pt will decrease BUE fascial restrictions from mod/max to minimal amounts or less to improve mobility required for functional reaching.    Time 4   Period Weeks   Status On-going     OT SHORT TERM GOAL #3   Title Pt will decrease pain in BUE to 3/10 or  less to improve completion of ADL tasks with minimal compensatory strategies.    Time 4   Period Weeks   Status On-going     OT SHORT TERM GOAL #4   Title Pt will improve BUE ROM to [facility] to improve ability to reach behind back for seatbelts and to fasten bra.    Time 4   Period Weeks   Status On-going     OT SHORT TERM GOAL #5   Title Pt will improve BUE strength to 4+/5 to increase ability to complete yardwork and housework tasks.    Time 4   Period Weeks   Status On-going                  Plan - 07/17/17 0951    Clinical Impression Statement A: Pt unable to complete AA/ROM flexion, and horizontal abduction today due to painful LUE popping after abduction, completed A/ROM with RUE. Continued with scapular A/ROM, OT notes LUE to be tighter than RUE. Pt reports she has noted completed her HEP like she should, has been completing housework. Did not add wall wash or ball stretches due to time constraints.    Plan P: Attempt to complete AA/ROM, add wall wash and therapy ball stretches   OT Home Exercise Plan 10/24: table slides, shoulder stretches   Consulted and Agree with Plan of Care Patient      Patient will benefit from skilled therapeutic intervention in order to improve the following deficits and impairments:  Impaired flexibility, Decreased strength, Decreased activity tolerance, Pain, Impaired UE functional use, Increased fascial restricitons, Decreased range of motion  Visit Diagnosis: Chronic right shoulder pain  Acute pain of left shoulder  Stiffness of left shoulder, not elsewhere classified  Other symptoms and signs involving the musculoskeletal system  Stiffness of right shoulder, not elsewhere classified    Problem List Patient Active Problem List   Diagnosis Date Noted  . Hypothyroidism, postop 11/19/2016  . Cyst in hand 08/23/2014  . Preventative health care 07/10/2014  . Attention deficit disorder 07/10/2014  . Obesity (BMI 30-39.9) 12/26/2013   . GERD (gastroesophageal reflux disease) 11/17/2012  . Depression with anxiety 10/28/2010  . INSOMNIA 08/07/2010  . ALLERGIC RHINITIS 11/30/2008  . OSTEOPENIA 03/10/2008  . Hyperlipidemia 04/19/2007  . Left carotid bruit 04/19/2007  . Essential hypertension 04/07/2007  . Osteoarthritis 04/07/2007   [name], OTR/L  [phone] 07/17/2017, 9:54 AM  [facility] [address] [place], [place], [address] Phone: [phone]   Fax:  [phone]  Name: [name] MRN: [id number] Date of Birth:  10/12/1940 

## 2017-07-21 ENCOUNTER — Encounter (HOSPITAL_COMMUNITY): Payer: Self-pay

## 2017-07-21 ENCOUNTER — Ambulatory Visit (HOSPITAL_COMMUNITY): Payer: Medicare Other

## 2017-07-21 DIAGNOSIS — M25511 Pain in right shoulder: Secondary | ICD-10-CM | POA: Diagnosis not present

## 2017-07-21 DIAGNOSIS — R29898 Other symptoms and signs involving the musculoskeletal system: Secondary | ICD-10-CM

## 2017-07-21 DIAGNOSIS — M25512 Pain in left shoulder: Secondary | ICD-10-CM

## 2017-07-21 DIAGNOSIS — G8929 Other chronic pain: Secondary | ICD-10-CM

## 2017-07-21 DIAGNOSIS — M25611 Stiffness of right shoulder, not elsewhere classified: Secondary | ICD-10-CM | POA: Diagnosis not present

## 2017-07-21 DIAGNOSIS — M25612 Stiffness of left shoulder, not elsewhere classified: Secondary | ICD-10-CM | POA: Diagnosis not present

## 2017-07-21 NOTE — Patient Instructions (Signed)

## 2017-07-21 NOTE — Therapy (Signed)
[address], [place], [address] Phone: [phone]   Fax:  [phone]  Occupational Therapy Treatment  Patient Details  Name: [name] MRN: [id number] Date of Birth: 10-Jul-1941 Referring Provider: Dr. Elsie Saas   Encounter Date: 07/21/2017  OT End of Session - 07/21/17 2149    Visit Number  4    Number of Visits  8    Date for OT Re-Evaluation  08/07/17    Authorization Type  1) Medicare A & B 2) BCBS    Authorization Time Period  Before 10th visit    Authorization - Visit Number  4    Authorization - Number of Visits  10    OT Start Time  4967 Pt arrived late   Pt arrived late   OT Stop Time  1640    OT Time Calculation (min)  70 min    Activity Tolerance  Patient tolerated treatment well    Behavior During Therapy  [facility] for tasks assessed/performed       Past Medical History:  Diagnosis Date  . ALLERGIC RHINITIS 11/30/2008  . ALLERGIC RHINITIS 11/30/2008  . ASTHMA UNSPECIFIED WITH EXACERBATION 02/17/2008  . CAROTID BRUIT, LEFT 04/19/2007  . Closed fracture of lateral malleolus 06/04/2009  . CLOSED FRACTURE OF METATARSAL BONE 06/04/2009  . Esophageal reflux 11/08/2008  . GANGLION CYST 04/19/2007  . HYPERLIPIDEMIA NEC/NOS 04/19/2007  . HYPERTENSION 04/07/2007  . INSOMNIA 08/07/2010  . OSTEOARTHRITIS 04/07/2007  . OSTEOPENIA 03/10/2008  . UTI 07/20/2009    Past Surgical History:  Procedure Laterality Date  . knuckle replacement    . TOTAL HIP ARTHROPLASTY    . TUBAL LIGATION      There were no vitals filed for this visit.  Subjective Assessment - 07/21/17 2145    Subjective   S: I had a bad day last time I was here.     Currently in Pain?  Yes    Pain Score  2     Pain Location  Shoulder    Pain Orientation  Left    Pain Type  Chronic pain         [facility] OT Assessment - 07/21/17 1617      Assessment   Diagnosis  Bilateral shoulder RT tendonitis and arthritis      Precautions   Precautions  Shoulder    Type of Shoulder Precautions  Progress as tolerated               OT Treatments/Exercises (OP) - 07/21/17 1617      Exercises   Exercises  Shoulder      Shoulder Exercises: Supine   Protraction  PROM;AROM;10 reps    Horizontal ABduction  PROM;AROM;10 reps    External Rotation  PROM;AROM;10 reps    Internal Rotation  PROM;AROM;10 reps    Flexion  PROM;AROM;10 reps    ABduction  PROM;AROM;10 reps      Shoulder Exercises: Standing   Protraction  AROM;10 reps    Horizontal ABduction  AROM;10 reps    External Rotation  AROM;10 reps    Internal Rotation  AROM;10 reps    Flexion  AROM;10 reps    ABduction  AROM;10 reps      Shoulder Exercises: ROM/Strengthening   Wall Wash  1' each arm      Manual Therapy   Manual Therapy  Myofascial release    Manual therapy comments  manual therapy completed seperately from all other interventions  Myofascial Release  myofascial release and manual stretching to bilateral upper arms, scapular, shoulder and cervical regions to decrease pain and fascial restrictions and improve pain free mobility             OT Education - 07/21/17 1632    Education provided  Yes    Education Details  A/ROM shoulder exercises    Person(s) Educated  Patient    Methods  Explanation;Demonstration;Handout    Comprehension  Verbalized understanding;Returned demonstration       OT Short Term Goals - 07/14/17 0942      OT SHORT TERM GOAL #1   Title  Pt will be provided with and educated on HEP for improved mobility in BUE.     Time  4    Period  Weeks    Status  On-going      OT SHORT TERM GOAL #2   Title  Pt will decrease BUE fascial restrictions from mod/max to minimal amounts or less to improve mobility required for functional reaching.     Time  4    Period  Weeks    Status  On-going      OT SHORT TERM GOAL #3   Title  Pt will decrease pain in BUE to 3/10 or less to improve completion of ADL tasks with minimal  compensatory strategies.     Time  4    Period  Weeks    Status  On-going      OT SHORT TERM GOAL #4   Title  Pt will improve BUE ROM to [facility] to improve ability to reach behind back for seatbelts and to fasten bra.     Time  4    Period  Weeks    Status  On-going      OT SHORT TERM GOAL #5   Title  Pt will improve BUE strength to 4+/5 to increase ability to complete yardwork and housework tasks.     Time  4    Period  Weeks    Status  On-going               Plan - 07/21/17 2150    Clinical Impression Statement  A: patient was able to complete all exercises supine and standing as A/ROM. HEP was updated. VC for form and technique were needed.     Plan  P: Follow up with HEP. Continue with A/ROM. Add scapular theraband when able.       Patient will benefit from skilled therapeutic intervention in order to improve the following deficits and impairments:  Impaired flexibility, Decreased strength, Decreased activity tolerance, Pain, Impaired UE functional use, Increased fascial restricitons, Decreased range of motion  Visit Diagnosis: Chronic right shoulder pain  Acute pain of left shoulder  Stiffness of left shoulder, not elsewhere classified  Other symptoms and signs involving the musculoskeletal system  Stiffness of right shoulder, not elsewhere classified    Problem List Patient Active Problem List   Diagnosis Date Noted  . Hypothyroidism, postop 11/19/2016  . Cyst in hand 08/23/2014  . Preventative health care 07/10/2014  . Attention deficit disorder 07/10/2014  . Obesity (BMI 30-39.9) 12/26/2013  . GERD (gastroesophageal reflux disease) 11/17/2012  . Depression with anxiety 10/28/2010  . INSOMNIA 08/07/2010  . ALLERGIC RHINITIS 11/30/2008  . OSTEOPENIA 03/10/2008  . Hyperlipidemia 04/19/2007  . Left carotid bruit 04/19/2007  . Essential hypertension 04/07/2007  . Osteoarthritis 04/07/2007   Ailene Ravel, OTR/L,CBIS  [phone]  07/21/2017,  9:53 PM  [facility] [place], [place], [address] Phone: [phone]   Fax:  [phone]  Name: [name] MRN: [id number] Date of Birth: [birth year]

## 2017-07-23 ENCOUNTER — Ambulatory Visit (HOSPITAL_COMMUNITY): Payer: Medicare Other | Admitting: Occupational Therapy

## 2017-07-23 ENCOUNTER — Encounter (HOSPITAL_COMMUNITY): Payer: Self-pay | Admitting: Occupational Therapy

## 2017-07-23 DIAGNOSIS — M25512 Pain in left shoulder: Secondary | ICD-10-CM | POA: Diagnosis not present

## 2017-07-23 DIAGNOSIS — M25511 Pain in right shoulder: Secondary | ICD-10-CM | POA: Diagnosis not present

## 2017-07-23 DIAGNOSIS — G8929 Other chronic pain: Secondary | ICD-10-CM

## 2017-07-23 DIAGNOSIS — M25612 Stiffness of left shoulder, not elsewhere classified: Secondary | ICD-10-CM | POA: Diagnosis not present

## 2017-07-23 DIAGNOSIS — R29898 Other symptoms and signs involving the musculoskeletal system: Secondary | ICD-10-CM | POA: Diagnosis not present

## 2017-07-23 DIAGNOSIS — M25611 Stiffness of right shoulder, not elsewhere classified: Secondary | ICD-10-CM

## 2017-07-23 NOTE — Therapy (Signed)
[facility] [place], [place], [address] Phone: [phone]   Fax:  [phone]  Occupational Therapy Treatment  Patient Details  Name: [name] MRN: [id number] Date of Birth: 1941-05-23 Referring Provider: Dr. Elsie Saas   Encounter Date: 07/23/2017  OT End of Session - 07/23/17 1414    Visit Number  5    Number of Visits  8    Date for OT Re-Evaluation  08/07/17    Authorization Type  1) Medicare A & B 2) BCBS    Authorization Time Period  Before 10th visit    Authorization - Visit Number  5    Authorization - Number of Visits  10    OT Start Time  1304    OT Stop Time  1412    OT Time Calculation (min)  68 min    Activity Tolerance  Patient tolerated treatment well    Behavior During Therapy  [facility] for tasks assessed/performed       Past Medical History:  Diagnosis Date  . ALLERGIC RHINITIS 11/30/2008  . ALLERGIC RHINITIS 11/30/2008  . ASTHMA UNSPECIFIED WITH EXACERBATION 02/17/2008  . CAROTID BRUIT, LEFT 04/19/2007  . Closed fracture of lateral malleolus 06/04/2009  . CLOSED FRACTURE OF METATARSAL BONE 06/04/2009  . Esophageal reflux 11/08/2008  . GANGLION CYST 04/19/2007  . HYPERLIPIDEMIA NEC/NOS 04/19/2007  . HYPERTENSION 04/07/2007  . INSOMNIA 08/07/2010  . OSTEOARTHRITIS 04/07/2007  . OSTEOPENIA 03/10/2008  . UTI 07/20/2009    Past Surgical History:  Procedure Laterality Date  . knuckle replacement    . TOTAL HIP ARTHROPLASTY    . TUBAL LIGATION      There were no vitals filed for this visit.  Subjective Assessment - 07/23/17 1304    Subjective   S: I woke up with some pain but I put heat on it and it feels better.     Currently in Pain?  Yes    Pain Score  2     Pain Location  Shoulder    Pain Orientation  Left    Pain Type  Chronic pain    Pain Radiating Towards  none    Pain Onset  More than a month ago    Pain Frequency  Intermittent    Aggravating Factors   reaching    Pain Relieving  Factors  rest, heat     Effect of Pain on Daily Activities  mod effect on ADL completion    Multiple Pain Sites  No         [facility] OT Assessment - 07/23/17 1303      Assessment   Diagnosis  Bilateral shoulder RT tendonitis and arthritis      Precautions   Precautions  Shoulder    Type of Shoulder Precautions  Progress as tolerated               OT Treatments/Exercises (OP) - 07/23/17 1308      Exercises   Exercises  Shoulder      Shoulder Exercises: Supine   Protraction  PROM;AROM;10 reps    Horizontal ABduction  PROM;AROM;10 reps    External Rotation  PROM;AROM;10 reps    Internal Rotation  PROM;AROM;10 reps    Flexion  PROM;AROM;10 reps    ABduction  PROM;AROM;10 reps      Shoulder Exercises: Standing   Protraction  AROM;10 reps    Horizontal ABduction  AROM;10 reps    External Rotation  AROM;10 reps  Internal Rotation  AROM;10 reps    Flexion  AROM;10 reps    ABduction  AROM;10 reps    Extension  Theraband;10 reps    Theraband Level (Shoulder Extension)  Level 2 (Red)    Row  Theraband;10 reps    Theraband Level (Shoulder Row)  Level 2 (Red)    Retraction  Theraband;10 reps    Theraband Level (Shoulder Retraction)  Level 2 (Red)      Shoulder Exercises: ROM/Strengthening   UBE (Upper Arm Bike)  Level 1 2' forward 2' reverse    Wall Wash  1' each arm    Thumb Tacks  1' each arm    Proximal Shoulder Strengthening, Supine  10X each no rest breaks    Proximal Shoulder Strengthening, Seated  10X each no rest breaks      Shoulder Exercises: Stretch   Corner Stretch  2 reps;10 seconds    Wall Stretch - Flexion  2 reps;10 seconds each arm      Manual Therapy   Manual Therapy  Myofascial release    Manual therapy comments  manual therapy completed seperately from all other interventions    Myofascial Release  myofascial release and manual stretching to bilateral upper arms, scapular, shoulder and cervical regions to decrease pain and fascial restrictions  and improve pain free mobility               OT Short Term Goals - 07/14/17 8850      OT SHORT TERM GOAL #1   Title  Pt will be provided with and educated on HEP for improved mobility in BUE.     Time  4    Period  Weeks    Status  On-going      OT SHORT TERM GOAL #2   Title  Pt will decrease BUE fascial restrictions from mod/max to minimal amounts or less to improve mobility required for functional reaching.     Time  4    Period  Weeks    Status  On-going      OT SHORT TERM GOAL #3   Title  Pt will decrease pain in BUE to 3/10 or less to improve completion of ADL tasks with minimal compensatory strategies.     Time  4    Period  Weeks    Status  On-going      OT SHORT TERM GOAL #4   Title  Pt will improve BUE ROM to [facility] to improve ability to reach behind back for seatbelts and to fasten bra.     Time  4    Period  Weeks    Status  On-going      OT SHORT TERM GOAL #5   Title  Pt will improve BUE strength to 4+/5 to increase ability to complete yardwork and housework tasks.     Time  4    Period  Weeks    Status  On-going               Plan - 07/23/17 1414    Clinical Impression Statement  A: Continued with A/ROM, added proximal shoulder strengthening, scapular theraband, overhead lacing, UBE, and resumed shoulder stretches. Pt required verbal cuing for form and technique for new exercises. Pt demonstrates A/ROM [facility] in BUE this session, occasional rest breaks provided for fatigue.     Plan  P: Continue with A/ROM and scapular theraband working towards independence in form. Continue with shoulder stretches and add x to v arms  Consulted and Agree with Plan of Care  Patient       Patient will benefit from skilled therapeutic intervention in order to improve the following deficits and impairments:  Impaired flexibility, Decreased strength, Decreased activity tolerance, Pain, Impaired UE functional use, Increased fascial restricitons, Decreased range of  motion  Visit Diagnosis: Chronic right shoulder pain  Acute pain of left shoulder  Stiffness of left shoulder, not elsewhere classified  Other symptoms and signs involving the musculoskeletal system  Stiffness of right shoulder, not elsewhere classified    Problem List Patient Active Problem List   Diagnosis Date Noted  . Hypothyroidism, postop 11/19/2016  . Cyst in hand 08/23/2014  . Preventative health care 07/10/2014  . Attention deficit disorder 07/10/2014  . Obesity (BMI 30-39.9) 12/26/2013  . GERD (gastroesophageal reflux disease) 11/17/2012  . Depression with anxiety 10/28/2010  . INSOMNIA 08/07/2010  . ALLERGIC RHINITIS 11/30/2008  . OSTEOPENIA 03/10/2008  . Hyperlipidemia 04/19/2007  . Left carotid bruit 04/19/2007  . Essential hypertension 04/07/2007  . Osteoarthritis 04/07/2007   Guadelupe Sabin, OTR/L  [phone] 07/23/2017, 2:17 PM  [facility] [address] [place], [place], [address] Phone: [phone]   Fax:  [phone]  Name: [name] MRN: [id number] Date of Birth: [birth year]

## 2017-07-28 ENCOUNTER — Ambulatory Visit (HOSPITAL_COMMUNITY): Payer: Medicare Other | Admitting: Specialist

## 2017-07-28 ENCOUNTER — Encounter (HOSPITAL_COMMUNITY): Payer: Self-pay | Admitting: Specialist

## 2017-07-28 DIAGNOSIS — M25512 Pain in left shoulder: Secondary | ICD-10-CM | POA: Diagnosis not present

## 2017-07-28 DIAGNOSIS — G8929 Other chronic pain: Secondary | ICD-10-CM | POA: Diagnosis not present

## 2017-07-28 DIAGNOSIS — M25611 Stiffness of right shoulder, not elsewhere classified: Secondary | ICD-10-CM | POA: Diagnosis not present

## 2017-07-28 DIAGNOSIS — M25511 Pain in right shoulder: Secondary | ICD-10-CM | POA: Diagnosis not present

## 2017-07-28 DIAGNOSIS — R29898 Other symptoms and signs involving the musculoskeletal system: Secondary | ICD-10-CM | POA: Diagnosis not present

## 2017-07-28 DIAGNOSIS — M25612 Stiffness of left shoulder, not elsewhere classified: Secondary | ICD-10-CM | POA: Diagnosis not present

## 2017-07-28 NOTE — Therapy (Signed)
[place], [place], [address] Phone: [phone]   Fax:  [phone]  Occupational Therapy Treatment  Patient Details  Name: [name] MRN: [id number] Date of Birth: [birth year] Referring Provider: Dr. Elsie Saas   Encounter Date: 07/28/2017  OT End of Session - 07/28/17 1150    Visit Number  6    Number of Visits  8    Date for OT Re-Evaluation  08/07/17    Authorization Type  1) Medicare A & B 2) BCBS    Authorization Time Period  Before 10th visit    Authorization - Visit Number  6    Authorization - Number of Visits  10    OT Start Time  1038    OT Stop Time  1136    OT Time Calculation (min)  58 min    Activity Tolerance  Patient tolerated treatment well    Behavior During Therapy  [facility] for tasks assessed/performed       Past Medical History:  Diagnosis Date  . ALLERGIC RHINITIS 11/30/2008  . ALLERGIC RHINITIS 11/30/2008  . ASTHMA UNSPECIFIED WITH EXACERBATION 02/17/2008  . CAROTID BRUIT, LEFT 04/19/2007  . Closed fracture of lateral malleolus 06/04/2009  . CLOSED FRACTURE OF METATARSAL BONE 06/04/2009  . Esophageal reflux 11/08/2008  . GANGLION CYST 04/19/2007  . HYPERLIPIDEMIA NEC/NOS 04/19/2007  . HYPERTENSION 04/07/2007  . INSOMNIA 08/07/2010  . OSTEOARTHRITIS 04/07/2007  . OSTEOPENIA 03/10/2008  . UTI 07/20/2009    Past Surgical History:  Procedure Laterality Date  . knuckle replacement    . TOTAL HIP ARTHROPLASTY    . TUBAL LIGATION      There were no vitals filed for this visit.  Subjective Assessment - 07/28/17 1149    Subjective   S:  I can put my seatbelt on now and comb my hair.     Currently in Pain?  Yes    Pain Score  4     Pain Location  Shoulder    Pain Orientation  Left    Pain Descriptors / Indicators  Aching;Sore    Pain Type  Chronic pain         [facility] OT Assessment - 07/28/17 0001      Assessment   Diagnosis  Bilateral shoulder RT tendonitis and arthritis      Precautions   Precautions  Shoulder    Type of Shoulder Precautions  Progress as tolerated               OT Treatments/Exercises (OP) - 07/28/17 0001      Exercises   Exercises  Shoulder      Shoulder Exercises: Supine   Protraction  PROM;5 reps;AROM;15 reps    Horizontal ABduction  PROM;5 reps;AROM;15 reps    External Rotation  PROM;5 reps;AROM;15 reps    Internal Rotation  PROM;5 reps;AROM;15 reps    Flexion  PROM;5 reps;AROM;15 reps    ABduction  PROM;5 reps;AROM;15 reps      Shoulder Exercises: Seated   Protraction  AROM;15 reps    Horizontal ABduction  AROM;15 reps    External Rotation  AROM;15 reps    Internal Rotation  AROM;15 reps    Flexion  AROM;15 reps    Abduction  AROM;15 reps      Shoulder Exercises: Therapy Ball   Right/Left  5 reps    Right/Left Limitations  therapist min facilitation at back transistion      Shoulder Exercises: ROM/Strengthening   UBE (  Upper Arm Bike)  level 1 3' forward and 3' reverse     Wall Wash  1' each arm    Wall Pushups  -- add next visit    "W" Arms  10 times with max verbal guidance for technique    X to V Arms  10 times with min verbal guidance     Proximal Shoulder Strengthening, Supine  10X each no rest breaks    Proximal Shoulder Strengthening, Seated  10X each no rest breaks      Manual Therapy   Manual Therapy  Myofascial release    Manual therapy comments  manual therapy completed seperately from all other interventions    Myofascial Release  myofascial release and manual stretching to bilateral upper arms, scapular, shoulder and cervical regions to decrease pain and fascial restrictions and improve pain free mobility               OT Short Term Goals - 07/14/17 0737      OT SHORT TERM GOAL #1   Title  Pt will be provided with and educated on HEP for improved mobility in BUE.     Time  4    Period  Weeks    Status  On-going      OT SHORT TERM GOAL #2   Title  Pt will decrease BUE fascial  restrictions from mod/max to minimal amounts or less to improve mobility required for functional reaching.     Time  4    Period  Weeks    Status  On-going      OT SHORT TERM GOAL #3   Title  Pt will decrease pain in BUE to 3/10 or less to improve completion of ADL tasks with minimal compensatory strategies.     Time  4    Period  Weeks    Status  On-going      OT SHORT TERM GOAL #4   Title  Pt will improve BUE ROM to [facility] to improve ability to reach behind back for seatbelts and to fasten bra.     Time  4    Period  Weeks    Status  On-going      OT SHORT TERM GOAL #5   Title  Pt will improve BUE strength to 4+/5 to increase ability to complete yardwork and housework tasks.     Time  4    Period  Weeks    Status  On-going               Plan - 07/28/17 1153    Clinical Impression Statement  A: Patient able to complete A/ROM in supine and seated with good form. Verbal guidance required to slow down rate of speed with most exercises. Theraband exercises are difficult for patient, particularly external and internal rotation, requiring increased cuing and tactile cuing and less available range of motion      Plan  P:  Complete theraband scapular strengthening with less cuing.  Add ball around back at waist height for improved ability to reach to fasten bra and fasten seatbelt with increased ease.        Patient will benefit from skilled therapeutic intervention in order to improve the following deficits and impairments:  Impaired flexibility, Decreased strength, Decreased activity tolerance, Pain, Impaired UE functional use, Increased fascial restricitons, Decreased range of motion  Visit Diagnosis: Chronic right shoulder pain  Acute pain of left shoulder  Stiffness of left shoulder, not elsewhere classified  Other  symptoms and signs involving the musculoskeletal system  Stiffness of right shoulder, not elsewhere classified    Problem List Patient Active Problem List    Diagnosis Date Noted  . Hypothyroidism, postop 11/19/2016  . Cyst in hand 08/23/2014  . Preventative health care 07/10/2014  . Attention deficit disorder 07/10/2014  . Obesity (BMI 30-39.9) 12/26/2013  . GERD (gastroesophageal reflux disease) 11/17/2012  . Depression with anxiety 10/28/2010  . INSOMNIA 08/07/2010  . ALLERGIC RHINITIS 11/30/2008  . OSTEOPENIA 03/10/2008  . Hyperlipidemia 04/19/2007  . Left carotid bruit 04/19/2007  . Essential hypertension 04/07/2007  . Osteoarthritis 04/07/2007    Vangie Bicker, [place], OTR/L [phone]  07/28/2017, 12:01 PM  [facility] [address] [place], [place], [address] Phone: [phone]   Fax:  [phone]  Name: LENETTE RAU MRN: [id number] Date of Birth: May 26, 1941

## 2017-07-29 ENCOUNTER — Telehealth (HOSPITAL_COMMUNITY): Payer: Self-pay

## 2017-07-29 NOTE — Telephone Encounter (Signed)
Called  to cx apptment OT will not be here tomorrow.. NF 07/29/17

## 2017-07-30 ENCOUNTER — Ambulatory Visit (HOSPITAL_COMMUNITY): Payer: Medicare Other

## 2017-08-03 ENCOUNTER — Ambulatory Visit (HOSPITAL_COMMUNITY): Payer: Medicare Other

## 2017-08-04 ENCOUNTER — Telehealth (HOSPITAL_COMMUNITY): Payer: Self-pay | Admitting: Internal Medicine

## 2017-08-04 NOTE — Telephone Encounter (Signed)
08/04/17  cx because she said she was so congested

## 2017-08-05 ENCOUNTER — Encounter (HOSPITAL_COMMUNITY): Payer: Self-pay | Admitting: Occupational Therapy

## 2017-08-09 ENCOUNTER — Other Ambulatory Visit: Payer: Self-pay | Admitting: Endocrinology

## 2017-08-10 ENCOUNTER — Telehealth (HOSPITAL_COMMUNITY): Payer: Self-pay | Admitting: Occupational Therapy

## 2017-08-10 NOTE — Telephone Encounter (Signed)
She has an apptment with MD and is having trouble walking due to hip pain, she will call us back and let us know whant the MD says

## 2017-08-11 ENCOUNTER — Ambulatory Visit (HOSPITAL_COMMUNITY): Payer: Medicare Other | Admitting: Occupational Therapy

## 2017-08-11 DIAGNOSIS — M7502 Adhesive capsulitis of left shoulder: Secondary | ICD-10-CM | POA: Diagnosis not present

## 2017-08-11 DIAGNOSIS — M5136 Other intervertebral disc degeneration, lumbar region: Secondary | ICD-10-CM | POA: Diagnosis not present

## 2017-08-13 ENCOUNTER — Other Ambulatory Visit: Payer: Self-pay

## 2017-08-13 ENCOUNTER — Ambulatory Visit (HOSPITAL_COMMUNITY): Payer: Medicare Other | Admitting: Occupational Therapy

## 2017-08-13 ENCOUNTER — Encounter (HOSPITAL_COMMUNITY): Payer: Self-pay | Admitting: Occupational Therapy

## 2017-08-13 DIAGNOSIS — M25512 Pain in left shoulder: Secondary | ICD-10-CM | POA: Diagnosis not present

## 2017-08-13 DIAGNOSIS — G8929 Other chronic pain: Secondary | ICD-10-CM

## 2017-08-13 DIAGNOSIS — M25612 Stiffness of left shoulder, not elsewhere classified: Secondary | ICD-10-CM

## 2017-08-13 DIAGNOSIS — M25611 Stiffness of right shoulder, not elsewhere classified: Secondary | ICD-10-CM | POA: Diagnosis not present

## 2017-08-13 DIAGNOSIS — R29898 Other symptoms and signs involving the musculoskeletal system: Secondary | ICD-10-CM

## 2017-08-13 DIAGNOSIS — M25511 Pain in right shoulder: Secondary | ICD-10-CM | POA: Diagnosis not present

## 2017-08-13 NOTE — Therapy (Signed)
[place], [place], [address] Phone: [phone]   Fax:  [phone]  Occupational Therapy Reassessment and Treatment  Patient Details  Name: [name] MRN: [id number] Date of Birth: August 27, 1941 Referring Provider: Dr. Elsie Saas   Encounter Date: 08/13/2017  OT End of Session - 08/13/17 1428    Visit Number  7    Number of Visits  15    Date for OT Re-Evaluation  09/12/17    Authorization Type  1) Medicare A & B 2) BCBS    Authorization Time Period  Before 17th visit    Authorization - Visit Number  7    Authorization - Number of Visits  17    OT Start Time  1302    OT Stop Time  1425    OT Time Calculation (min)  83 min    Activity Tolerance  Patient tolerated treatment well    Behavior During Therapy  WFL for tasks assessed/performed       Past Medical History:  Diagnosis Date  . ALLERGIC RHINITIS 11/30/2008  . ALLERGIC RHINITIS 11/30/2008  . ASTHMA UNSPECIFIED WITH EXACERBATION 02/17/2008  . CAROTID BRUIT, LEFT 04/19/2007  . Closed fracture of lateral malleolus 06/04/2009  . CLOSED FRACTURE OF METATARSAL BONE 06/04/2009  . Esophageal reflux 11/08/2008  . GANGLION CYST 04/19/2007  . HYPERLIPIDEMIA NEC/NOS 04/19/2007  . HYPERTENSION 04/07/2007  . INSOMNIA 08/07/2010  . OSTEOARTHRITIS 04/07/2007  . OSTEOPENIA 03/10/2008  . UTI 07/20/2009    Past Surgical History:  Procedure Laterality Date  . knuckle replacement    . TOTAL HIP ARTHROPLASTY    . TUBAL LIGATION      There were no vitals filed for this visit.  Subjective Assessment - 08/13/17 1304    Subjective   S: When I fall I catch myself on my shoulders.     Currently in Pain?  No/denies         [facility] OT Assessment - 08/13/17 1304      Assessment   Diagnosis  Bilateral shoulder RT tendonitis and arthritis      Precautions   Precautions  Shoulder    Type of Shoulder Precautions  Progress as tolerated      Palpation   Palpation  comment  Mod fascial restrictions along bilateral upper arms, trapezius, and scapularis regions      AROM   Overall AROM Comments  Assessed seated, er/IR adducted    AROM Assessment Site  Shoulder    Right/Left Shoulder  Right;Left    Right Shoulder Flexion  160 Degrees 130 previous    Right Shoulder ABduction  167 Degrees 131 previous    Right Shoulder Internal Rotation  90 Degrees same as previous    Right Shoulder External Rotation  64 Degrees 50 previous    Left Shoulder Flexion  147 Degrees 135 previous    Left Shoulder ABduction  155 Degrees 76 previous    Left Shoulder Internal Rotation  90 Degrees same as previous    Left Shoulder External Rotation  59 Degrees 44 previous      PROM   Overall PROM Comments  Assessed supine, er/IR adducted    PROM Assessment Site  Shoulder    Right/Left Shoulder  Right;Left    Right Shoulder Flexion  160 Degrees 150 previous    Right Shoulder ABduction  180 Degrees same as previous    Right Shoulder Internal Rotation  90 Degrees same as previous    Right  Shoulder External Rotation  82 Degrees same as previous    Left Shoulder Flexion  149 Degrees 134 previous    Left Shoulder ABduction  151 Degrees 96 previous    Left Shoulder Internal Rotation  90 Degrees same as previous    Left Shoulder External Rotation  47 Degrees 45 previous      Strength   Overall Strength Comments  Assessed seated, er/IR adducted    Strength Assessment Site  Shoulder    Right/Left Shoulder  Right;Left    Right Shoulder Flexion  4+/5 4/5 previous    Right Shoulder ABduction  4+/5 4/5 previous    Right Shoulder Internal Rotation  4/5 same as previous    Right Shoulder External Rotation  4/5 4-/5 previous    Left Shoulder Flexion  4/5 4-/5 previous    Left Shoulder ABduction  4/5 4-/5 previous    Left Shoulder Internal Rotation  4/5 4-/5 previous    Left Shoulder External Rotation  4/5 3+/5 previous               OT Treatments/Exercises (OP) - 08/13/17  1305      Exercises   Exercises  Shoulder      Shoulder Exercises: Supine   Protraction  PROM;5 reps;AROM;15 reps    Horizontal ABduction  PROM;5 reps;AROM;15 reps    External Rotation  PROM;5 reps;AROM;15 reps    Internal Rotation  PROM;5 reps;AROM;15 reps    Flexion  PROM;5 reps;AROM;15 reps    ABduction  PROM;5 reps;AROM;15 reps      Shoulder Exercises: Seated   Protraction  AROM;15 reps    Horizontal ABduction  AROM;15 reps    External Rotation  AROM;15 reps    Internal Rotation  AROM;15 reps    Flexion  AROM;15 reps    Abduction  AROM;15 reps      Shoulder Exercises: Standing   Extension  Theraband;10 reps    Theraband Level (Shoulder Extension)  Level 2 (Red)    Row  Theraband;10 reps    Theraband Level (Shoulder Row)  Level 2 (Red)    Retraction  Theraband;10 reps    Theraband Level (Shoulder Retraction)  Level 2 (Red)      Shoulder Exercises: ROM/Strengthening   UBE (Upper Arm Bike)  level 1 2' forward and 2' reverse     Over Head Lace  1' each arm    X to V Arms  10 times with min verbal guidance     Proximal Shoulder Strengthening, Supine  10X each no rest breaks    Proximal Shoulder Strengthening, Seated  10X each no rest breaks      Manual Therapy   Manual Therapy  Myofascial release    Manual therapy comments  manual therapy completed seperately from all other interventions    Myofascial Release  myofascial release and manual stretching to bilateral upper arms, scapular, shoulder and cervical regions to decrease pain and fascial restrictions and improve pain free mobility               OT Short Term Goals - 08/13/17 1355      OT SHORT TERM GOAL #1   Title  Pt will be provided with and educated on HEP for improved mobility in BUE.     Time  4    Period  Weeks    Status  On-going      OT SHORT TERM GOAL #2   Title  Pt will decrease BUE fascial restrictions from mod/max to  minimal amounts or less to improve mobility required for functional  reaching.     Time  4    Period  Weeks    Status  On-going      OT SHORT TERM GOAL #3   Title  Pt will decrease pain in BUE to 3/10 or less to improve completion of ADL tasks with minimal compensatory strategies.     Time  4    Period  Weeks    Status  On-going      OT SHORT TERM GOAL #4   Title  Pt will improve BUE ROM to [facility] to improve ability to reach behind back for seatbelts and to fasten bra.     Time  4    Period  Weeks    Status  Partially Met      OT SHORT TERM GOAL #5   Title  Pt will improve BUE strength to 4+/5 to increase ability to complete yardwork and housework tasks.     Time  4    Period  Weeks    Status  Partially Met               Plan - 2017-09-12 1429    Clinical Impression Statement  A: Reassessment completed this session, pt has partially met 2/5 goals making progress with BUE ROM, strength, and pain during functional tasks. Pt continues to have greater pain and limitations in the LUE compared to RUE. Continued with shoulder A/ROM this session, resuming scapular theraband strengthening and adding overhead lacing; occasional verbal cuing for form and speed with exercises. Pt will benefit from continued skilled OT services to focus on strength and stability required for functional task completion.      Rehab Potential  Good    OT Frequency  2x / week    OT Duration  4 weeks    OT Treatment/Interventions  Self-care/ADL training;Therapeutic exercise;Patient/family education;Manual Therapy;Ultrasound;Therapeutic activities;Cryotherapy;Electrical Stimulation;Passive range of motion;Moist Heat    Plan  P: Focus on shoulder strengthening and stability, scapular strengthening. Update HEP for A/ROM, add/resume IR shoulder stretch    OT Home Exercise Plan  10/24: table slides, shoulder stretches    Consulted and Agree with Plan of Care  Patient       Patient will benefit from skilled therapeutic intervention in order to improve the following deficits and  impairments:  Impaired flexibility, Decreased strength, Decreased activity tolerance, Pain, Impaired UE functional use, Increased fascial restricitons, Decreased range of motion  Visit Diagnosis: Chronic right shoulder pain  Acute pain of left shoulder  Stiffness of left shoulder, not elsewhere classified  Other symptoms and signs involving the musculoskeletal system  Stiffness of right shoulder, not elsewhere classified  G-Codes - 09/12/17 1437    Functional Assessment Tool Used (Outpatient only)  FOTO Score: 61/100 (39% impairment)    Functional Limitation  Carrying, moving and handling objects    Carrying, Moving and Handling Objects Current Status (V7034)  At least 20 percent but less than 40 percent impaired, limited or restricted    Carrying, Moving and Handling Objects Goal Status (K3524)  At least 1 percent but less than 20 percent impaired, limited or restricted       Problem List Patient Active Problem List   Diagnosis Date Noted  . Hypothyroidism, postop 11/19/2016  . Cyst in hand 08/23/2014  . Preventative health care 07/10/2014  . Attention deficit disorder 07/10/2014  . Obesity (BMI 30-39.9) 12/26/2013  . GERD (gastroesophageal reflux disease) 11/17/2012  . Depression with  anxiety 10/28/2010  . INSOMNIA 08/07/2010  . ALLERGIC RHINITIS 11/30/2008  . OSTEOPENIA 03/10/2008  . Hyperlipidemia 04/19/2007  . Left carotid bruit 04/19/2007  . Essential hypertension 04/07/2007  . Osteoarthritis 04/07/2007    Guadelupe Sabin, OTR/L  [phone] 08/13/2017, 2:38 PM  [facility] [address] [place], [place], [address] Phone: [phone]   Fax:  [phone]  Name: [name] MRN: [id number] Date of Birth: Jun 20, 1941

## 2017-08-18 ENCOUNTER — Ambulatory Visit (HOSPITAL_COMMUNITY): Payer: Medicare Other | Attending: Orthopedic Surgery

## 2017-08-18 ENCOUNTER — Encounter (HOSPITAL_COMMUNITY): Payer: Self-pay

## 2017-08-18 DIAGNOSIS — R293 Abnormal posture: Secondary | ICD-10-CM | POA: Diagnosis not present

## 2017-08-18 DIAGNOSIS — R29898 Other symptoms and signs involving the musculoskeletal system: Secondary | ICD-10-CM | POA: Diagnosis not present

## 2017-08-18 DIAGNOSIS — M25611 Stiffness of right shoulder, not elsewhere classified: Secondary | ICD-10-CM | POA: Diagnosis not present

## 2017-08-18 DIAGNOSIS — M25512 Pain in left shoulder: Secondary | ICD-10-CM | POA: Diagnosis not present

## 2017-08-18 DIAGNOSIS — M25511 Pain in right shoulder: Secondary | ICD-10-CM | POA: Insufficient documentation

## 2017-08-18 DIAGNOSIS — M25612 Stiffness of left shoulder, not elsewhere classified: Secondary | ICD-10-CM | POA: Insufficient documentation

## 2017-08-18 DIAGNOSIS — M5442 Lumbago with sciatica, left side: Secondary | ICD-10-CM | POA: Insufficient documentation

## 2017-08-18 DIAGNOSIS — Z7409 Other reduced mobility: Secondary | ICD-10-CM | POA: Insufficient documentation

## 2017-08-18 DIAGNOSIS — R531 Weakness: Secondary | ICD-10-CM | POA: Diagnosis not present

## 2017-08-18 DIAGNOSIS — G8929 Other chronic pain: Secondary | ICD-10-CM | POA: Insufficient documentation

## 2017-08-18 NOTE — Therapy (Signed)
[place], [place], [address] Phone: [phone]   Fax:  [phone]  Occupational Therapy Treatment  Patient Details  Name: [name] MRN: [id number] Date of Birth: 08/26/1941 Referring Provider: Dr. Elsie Saas   Encounter Date: 08/18/2017  OT End of Session - 08/18/17 1527    Visit Number  8    Number of Visits  15    Date for OT Re-Evaluation  09/12/17    Authorization Type  1) Medicare A & B 2) BCBS    Authorization Time Period  Before 17th visit    Authorization - Visit Number  8    Authorization - Number of Visits  17    OT Start Time  9201 Pt arrived late    OT Stop Time  1430    OT Time Calculation (min)  28 min    Activity Tolerance  Patient tolerated treatment well    Behavior During Therapy  [facility] for tasks assessed/performed       Past Medical History:  Diagnosis Date  . ALLERGIC RHINITIS 11/30/2008  . ALLERGIC RHINITIS 11/30/2008  . ASTHMA UNSPECIFIED WITH EXACERBATION 02/17/2008  . CAROTID BRUIT, LEFT 04/19/2007  . Closed fracture of lateral malleolus 06/04/2009  . CLOSED FRACTURE OF METATARSAL BONE 06/04/2009  . Esophageal reflux 11/08/2008  . GANGLION CYST 04/19/2007  . HYPERLIPIDEMIA NEC/NOS 04/19/2007  . HYPERTENSION 04/07/2007  . INSOMNIA 08/07/2010  . OSTEOARTHRITIS 04/07/2007  . OSTEOPENIA 03/10/2008  . UTI 07/20/2009    Past Surgical History:  Procedure Laterality Date  . knuckle replacement    . TOTAL HIP ARTHROPLASTY    . TUBAL LIGATION      There were no vitals filed for this visit.  Subjective Assessment - 08/18/17 1407    Subjective   S: My shoulders don't get sore until I start doing this exercises.     Currently in Pain?  No/denies         [facility] OT Assessment - 08/18/17 1408      Assessment   Diagnosis  Bilateral shoulder RT tendonitis and arthritis      Precautions   Precautions  Shoulder    Type of Shoulder Precautions  Progress as tolerated                OT Treatments/Exercises (OP) - 08/18/17 1409      Exercises   Exercises  Shoulder      Shoulder Exercises: Supine   Protraction  AROM;15 reps    Horizontal ABduction  AROM;15 reps    External Rotation  AROM;15 reps    Internal Rotation  AROM;15 reps    Flexion  AROM;15 reps    ABduction  AROM;15 reps      Shoulder Exercises: Seated   Protraction  AROM;15 reps    Horizontal ABduction  AROM;15 reps    External Rotation  AROM;15 reps    Internal Rotation  AROM;15 reps    Flexion  AROM;15 reps    Abduction  AROM;15 reps      Shoulder Exercises: Standing   Row  Theraband;10 reps    Theraband Level (Shoulder Row)  Level 2 (Red)      Shoulder Exercises: ROM/Strengthening   X to V Arms  15 times with min verbal guidance     Proximal Shoulder Strengthening, Supine  12X each no rest breaks    Proximal Shoulder Strengthening, Seated  10X each no rest breaks      Manual Therapy  Manual Therapy  Myofascial release    Manual therapy comments  manual therapy completed seperately from all other interventions    Myofascial Release  myofascial release and manual stretching to bilateral upper arms, scapular, shoulder and cervical regions to decrease pain and fascial restrictions and improve pain free mobility             OT Education - 08/18/17 1532    Education provided  Yes    Education Details  A/ROM shoulder exercises were given again.     Person(s) Educated  Patient    Methods  Explanation;Demonstration;Handout    Comprehension  Verbalized understanding;Returned demonstration       OT Short Term Goals - 08/13/17 1355      OT SHORT TERM GOAL #1   Title  Pt will be provided with and educated on HEP for improved mobility in BUE.     Time  4    Period  Weeks    Status  On-going      OT SHORT TERM GOAL #2   Title  Pt will decrease BUE fascial restrictions from mod/max to minimal amounts or less to improve mobility required for functional reaching.      Time  4    Period  Weeks    Status  On-going      OT SHORT TERM GOAL #3   Title  Pt will decrease pain in BUE to 3/10 or less to improve completion of ADL tasks with minimal compensatory strategies.     Time  4    Period  Weeks    Status  On-going      OT SHORT TERM GOAL #4   Title  Pt will improve BUE ROM to [facility] to improve ability to reach behind back for seatbelts and to fasten bra.     Time  4    Period  Weeks    Status  Partially Met      OT SHORT TERM GOAL #5   Title  Pt will improve BUE strength to 4+/5 to increase ability to complete yardwork and housework tasks.     Time  4    Period  Weeks    Status  Partially Met               Plan - 08/18/17 1528    Clinical Impression Statement  A: Pt arrived late for session. Session focused on strengthening/ROM due to time constraint. Patient completed A/ROM and scapular strengthening with red theraband. patient was extremely distractable during session and required VC for form and technique.     Plan  P: Continue to focus on shoulder strengthening and stability. Resume scapular strengthening. Add/resume IR stretch.       Patient will benefit from skilled therapeutic intervention in order to improve the following deficits and impairments:  Impaired flexibility, Decreased strength, Decreased activity tolerance, Pain, Impaired UE functional use, Increased fascial restrictions, Decreased range of motion  Visit Diagnosis: Chronic right shoulder pain  Acute pain of left shoulder  Stiffness of left shoulder, not elsewhere classified  Other symptoms and signs involving the musculoskeletal system  Stiffness of right shoulder, not elsewhere classified    Problem List Patient Active Problem List   Diagnosis Date Noted  . Hypothyroidism, postop 11/19/2016  . Cyst in hand 08/23/2014  . Preventative health care 07/10/2014  . Attention deficit disorder 07/10/2014  . Obesity (BMI 30-39.9) 12/26/2013  . GERD  (gastroesophageal reflux disease) 11/17/2012  . Depression with anxiety 10/28/2010  .  INSOMNIA 08/07/2010  . ALLERGIC RHINITIS 11/30/2008  . OSTEOPENIA 03/10/2008  . Hyperlipidemia 04/19/2007  . Left carotid bruit 04/19/2007  . Essential hypertension 04/07/2007  . Osteoarthritis 04/07/2007   Ailene Ravel, OTR/L,CBIS  [phone]  08/18/2017, 3:34 PM  [facility] [address] [place], [place], [address] Phone: [phone]   Fax:  [phone]  Name: TYANNE DEROCHER MRN: [id number] Date of Birth: 1941/05/14

## 2017-08-18 NOTE — Patient Instructions (Addendum)

## 2017-08-19 ENCOUNTER — Encounter: Payer: Self-pay | Admitting: Internal Medicine

## 2017-08-19 ENCOUNTER — Ambulatory Visit (INDEPENDENT_AMBULATORY_CARE_PROVIDER_SITE_OTHER): Payer: Medicare Other | Admitting: Internal Medicine

## 2017-08-19 VITALS — BP 136/78 | HR 88 | Temp 97.6°F | Ht 62.0 in | Wt 187.2 lb

## 2017-08-19 DIAGNOSIS — M150 Primary generalized (osteo)arthritis: Secondary | ICD-10-CM | POA: Diagnosis not present

## 2017-08-19 DIAGNOSIS — M159 Polyosteoarthritis, unspecified: Secondary | ICD-10-CM

## 2017-08-19 DIAGNOSIS — F900 Attention-deficit hyperactivity disorder, predominantly inattentive type: Secondary | ICD-10-CM | POA: Diagnosis not present

## 2017-08-19 DIAGNOSIS — I10 Essential (primary) hypertension: Secondary | ICD-10-CM

## 2017-08-19 MED ORDER — AMPHETAMINE-DEXTROAMPHETAMINE 30 MG PO TABS: 30.0000 mg | ORAL_TABLET | Freq: Two times a day (BID) | ORAL | 0 refills | Status: DC

## 2017-08-19 MED ORDER — AMPHETAMINE-DEXTROAMPHETAMINE 30 MG PO TABS: ORAL_TABLET | ORAL | 0 refills | Status: DC

## 2017-08-19 NOTE — Progress Notes (Signed)
Subjective:    Patient ID: [name], female    DOB: 1941/02/09, 76 y.o.   MRN: [id number]  HPI   76 year old patient who has a history of depression essential hypertension and osteoarthritis. She has had some blood pressure concerns.  She also has had some recent rhinorrhea And over the past week has had more frequent falls.  She states that last weekend she fell on 3 occasions she has had 3-4 falls over the prior 2 years.  She states her legs simply give away and denies any dizziness or balance issues. She continues to receive rehab due to shoulder pain. She has history of ADHD and is requesting medicine refills.  Past Medical History:  Diagnosis Date  . ALLERGIC RHINITIS 11/30/2008  . ALLERGIC RHINITIS 11/30/2008  . ASTHMA UNSPECIFIED WITH EXACERBATION 02/17/2008  . CAROTID BRUIT, LEFT 04/19/2007  . Closed fracture of lateral malleolus 06/04/2009  . CLOSED FRACTURE OF METATARSAL BONE 06/04/2009  . Esophageal reflux 11/08/2008  . GANGLION CYST 04/19/2007  . HYPERLIPIDEMIA NEC/NOS 04/19/2007  . HYPERTENSION 04/07/2007  . INSOMNIA 08/07/2010  . OSTEOARTHRITIS 04/07/2007  . OSTEOPENIA 03/10/2008  . UTI 07/20/2009     Social History   Socioeconomic History  . Marital status: Married    Spouse name: Not on file  . Number of children: Not on file  . Years of education: Not on file  . Highest education level: Not on file  Social Needs  . Financial resource strain: Not on file  . Food insecurity - worry: Not on file  . Food insecurity - inability: Not on file  . Transportation needs - medical: Not on file  . Transportation needs - non-medical: Not on file  Occupational History  . Occupation: retired    Fish farm manager: RETIRED  Tobacco Use  . Smoking status: Former Smoker    Packs/day: 1.50    Years: 20.00    Pack years: 30.00    Types: Cigarettes    Last attempt to quit: 09/15/1968    Years since quitting: 48.9  . Smokeless tobacco: Never Used  . Tobacco comment: smoked 1.5 for  20  years  Substance and Sexual Activity  . Alcohol use: No  . Drug use: No  . Sexual activity: Yes  Other Topics Concern  . Not on file  Social History Narrative   Married for 50 years. She has 1 son that age 73   Retired from Environmental [employer]          Past Surgical History:  Procedure Laterality Date  . knuckle replacement    . TOTAL HIP ARTHROPLASTY    . TUBAL LIGATION      Family History  Problem Relation Age of Onset  . Heart disease Mother        Mother also has significant carotid artery stenosis  . COPD Father   . Alcoholism Father   . Cancer Brother   . Alzheimer's disease Sister   . Thyroid disease Sister        Goiter  . Breast cancer Neg Hx     Allergies  Allergen Reactions  . Cephalexin Other (See Comments)    REACTION IS UNKNOWN    Current Outpatient Medications on File Prior to Visit  Medication Sig Dispense Refill  . aspirin-acetaminophen-caffeine (EXCEDRIN MIGRAINE) 250-250-65 MG per tablet Take 1 tablet by mouth every 6 (six) hours as needed. (Patient taking differently: Take 1 tablet by mouth daily as needed for headache or migraine. ) 30 tablet 0  .  buPROPion (WELLBUTRIN XL) 300 MG 24 hr tablet TAKE ONE TABLET BY MOUTH ONCE DAILY 90 tablet 1  . calcium carbonate (TUMS - DOSED IN MG ELEMENTAL CALCIUM) 500 MG chewable tablet Chew 1-2 tablets by mouth 2 (two) times daily.     Marland Kitchen conjugated estrogens (PREMARIN) vaginal cream Place 1 Applicatorful vaginally daily. Apply twice per week 42.5 g 12  . Cyanocobalamin (B-12 PO) Take 1 tablet by mouth daily. Reported on 01/21/2016    . diclofenac sodium (VOLTAREN) 1 % GEL APPLY   TOPICALLY TO AFFECTED AREA 4 TIMES DAILY AS NEEDED FOR PAIN 100 g 11  . FLUoxetine (PROZAC) 10 MG capsule TAKE 3 CAPSULES(30 MG) BY MOUTH DAILY 270 capsule 3  . glucosamine-chondroitin 500-400 MG tablet Take 1 tablet by mouth 3 (three) times daily.    Marland Kitchen levothyroxine (SYNTHROID, LEVOTHROID) 112 MCG tablet TAKE 1 TABLET BY MOUTH  ONCE DAILY BEFORE BREAKFAST 30 tablet 3  . liothyronine (CYTOMEL) 5 MCG tablet TAKE 1 TABLET BY MOUTH ONCE DAILY 30 tablet 3  . losartan-hydrochlorothiazide (HYZAAR) 100-25 MG tablet Take 1 tablet by mouth daily. 90 tablet 3  . Melatonin 3 MG TABS Take 3 mg by mouth at bedtime. Reported on 01/21/2016    . Multiple Vitamin (MULTIVITAMIN) capsule Take 1 capsule by mouth daily.      . ranitidine (ZANTAC) 150 MG tablet TAKE ONE TABLET BY MOUTH TWICE DAILY AS NEEDED FOR  HEARTBURN 180 tablet 1   No current facility-administered medications on file prior to visit.     BP 136/78 (BP Location: Left Arm, Patient Position: Sitting, Cuff Size: Normal)   Pulse 88   Temp 97.6 F (36.4 C) (Oral)   Ht 5\' 2"  (1.575 m)   Wt 187 lb 3.2 oz (84.9 kg)   SpO2 96%   BMI 34.24 kg/m     Review of Systems  Constitutional: Positive for activity change.  HENT: Negative for congestion, dental problem, hearing loss, rhinorrhea, sinus pressure, sore throat and tinnitus.   Eyes: Negative for pain, discharge and visual disturbance.  Respiratory: Negative for cough and shortness of breath.   Cardiovascular: Negative for chest pain, palpitations and leg swelling.  Gastrointestinal: Negative for abdominal distention, abdominal pain, blood in stool, constipation, diarrhea, nausea and vomiting.  Genitourinary: Negative for difficulty urinating, dysuria, flank pain, frequency, hematuria, pelvic pain, urgency, vaginal bleeding, vaginal discharge and vaginal pain.  Musculoskeletal: Positive for arthralgias and gait problem. Negative for joint swelling.  Skin: Negative for rash.  Neurological: Negative for dizziness, syncope, speech difficulty, weakness, numbness and headaches.  Hematological: Negative for adenopathy.  Psychiatric/Behavioral: Negative for agitation, behavioral problems and dysphoric mood. The patient is not nervous/anxious.        Objective:   Physical Exam  Constitutional: She is oriented to person,  place, and time. She appears well-developed and well-nourished.  Able to stand from a sitting position and transferred to the examining table without difficulty Blood pressure 130/80 without orthostatic change  Gait is normal without unsteadiness  HENT:  Head: Normocephalic.  Right Ear: External ear normal.  Left Ear: External ear normal.  Mouth/Throat: Oropharynx is clear and moist.  Eyes: Conjunctivae and EOM are normal. Pupils are equal, round, and reactive to light.  Neck: Normal range of motion. Neck supple. No thyromegaly present.  Cardiovascular: Normal rate, regular rhythm, normal heart sounds and intact distal pulses.  Pulmonary/Chest: Effort normal and breath sounds normal.  Abdominal: Soft. Bowel sounds are normal. She exhibits no mass. There is no tenderness.  Musculoskeletal: Normal range of motion.  Lymphadenopathy:    She has no cervical adenopathy.  Neurological: She is alert and oriented to person, place, and time.  Skin: Skin is warm and dry. No rash noted.  Psychiatric: She has a normal mood and affect. Her behavior is normal.          Assessment & Plan:   Essential hypertension.  Blood pressure reasonly well controlled at present Viral URI with cough.  Will treat symptomatically Frequent falls.  Multifactorial.  Patient will continue to make efforts at weight loss and increase her activity level and conditioning.  Continue rehab  Schedule CPX ADHD.  Adderall refilled  Nyoka Cowden

## 2017-08-19 NOTE — Patient Instructions (Signed)
Limit your sodium (Salt) intake  Please check your blood pressure on a regular basis.  If it is consistently greater than 150/90, please make an office appointment.    It is important that you exercise regularly, at least 20 minutes 3 to 4 times per week.  If you develop chest pain or shortness of breath seek  medical attention.  She appears well, in no apparent distress.  Alert and oriented times three, pleasant and cooperative. Vital signs are as documented in vital signs section.  Return in 6 months for follow-up

## 2017-08-20 ENCOUNTER — Ambulatory Visit (HOSPITAL_COMMUNITY): Payer: Medicare Other

## 2017-08-20 ENCOUNTER — Encounter (HOSPITAL_COMMUNITY): Payer: Self-pay

## 2017-08-20 DIAGNOSIS — M25512 Pain in left shoulder: Secondary | ICD-10-CM | POA: Diagnosis not present

## 2017-08-20 DIAGNOSIS — R29898 Other symptoms and signs involving the musculoskeletal system: Secondary | ICD-10-CM | POA: Diagnosis not present

## 2017-08-20 DIAGNOSIS — M25611 Stiffness of right shoulder, not elsewhere classified: Secondary | ICD-10-CM

## 2017-08-20 DIAGNOSIS — M25511 Pain in right shoulder: Secondary | ICD-10-CM | POA: Diagnosis not present

## 2017-08-20 DIAGNOSIS — M5442 Lumbago with sciatica, left side: Secondary | ICD-10-CM

## 2017-08-20 DIAGNOSIS — M25612 Stiffness of left shoulder, not elsewhere classified: Secondary | ICD-10-CM | POA: Diagnosis not present

## 2017-08-20 DIAGNOSIS — Z7409 Other reduced mobility: Secondary | ICD-10-CM

## 2017-08-20 DIAGNOSIS — R531 Weakness: Secondary | ICD-10-CM

## 2017-08-20 DIAGNOSIS — G8929 Other chronic pain: Secondary | ICD-10-CM | POA: Diagnosis not present

## 2017-08-20 DIAGNOSIS — R293 Abnormal posture: Secondary | ICD-10-CM

## 2017-08-20 NOTE — Patient Instructions (Signed)
  TANDEM STANCE WITH SUPPORT 3x30 seconds each Stand in front of a chair, table or counter top for support. Then place the heel of one foot so that it is touching the toes of the other foot. Maintain your balance in this position.    SINGLE LEG STANCE - SLS 30s x 3 each Stand on one leg and maintain your balance.       Prone Quadriceps Stretch 3 reps, 30 second each Lie on your stomach; hook a strap around your foot/ankle.  Gently pull your heel towards your bottom until a stretch is felt in the front of the thigh.  Keep your back relaxed and your hips flat on the table.     BRIDGING 10 reps, 5second hold  While lying on your back with knees bent, tighten your lower abdominals, squeeze your buttocks and then raise your buttocks off the floor/bed as creating a "Bridge" with your body. Hold and then lower yourself and repeat.

## 2017-08-20 NOTE — Therapy (Signed)
[place], [place], [address] Phone: [phone]   Fax:  [phone]  Occupational Therapy Treatment  Patient Details  Name: [name] MRN: [id number] Date of Birth: 05/06/1941 Referring Provider: Dr. Elsie Saas   Encounter Date: 08/20/2017  OT End of Session - 08/20/17 1555    Visit Number  9    Number of Visits  15    Date for OT Re-Evaluation  09/12/17    Authorization Type  1) Medicare A & B 2) BCBS    Authorization Time Period  Before 17th visit    Authorization - Visit Number  9    Authorization - Number of Visits  17    OT Start Time  1440    OT Stop Time  1515    OT Time Calculation (min)  35 min    Activity Tolerance  Patient tolerated treatment well    Behavior During Therapy  [facility] for tasks assessed/performed       Past Medical History:  Diagnosis Date  . ALLERGIC RHINITIS 11/30/2008  . ALLERGIC RHINITIS 11/30/2008  . ASTHMA UNSPECIFIED WITH EXACERBATION 02/17/2008  . CAROTID BRUIT, LEFT 04/19/2007  . Closed fracture of lateral malleolus 06/04/2009  . CLOSED FRACTURE OF METATARSAL BONE 06/04/2009  . Esophageal reflux 11/08/2008  . GANGLION CYST 04/19/2007  . HYPERLIPIDEMIA NEC/NOS 04/19/2007  . HYPERTENSION 04/07/2007  . INSOMNIA 08/07/2010  . OSTEOARTHRITIS 04/07/2007  . OSTEOPENIA 03/10/2008  . UTI 07/20/2009    Past Surgical History:  Procedure Laterality Date  . knuckle replacement    . TOTAL HIP ARTHROPLASTY    . TUBAL LIGATION      There were no vitals filed for this visit.  Subjective Assessment - 08/20/17 1550    Subjective   S: I'm really messing up my shoulder by falling all the time and using my arms to catch me.    Currently in Pain?  No/denies No movement unless I move.         [facility] OT Assessment - 08/20/17 1551      Assessment   Diagnosis  Bilateral shoulder RT tendonitis and arthritis      Precautions   Precautions  Shoulder    Type of Shoulder Precautions   Progress as tolerated               OT Treatments/Exercises (OP) - 08/20/17 1551      Exercises   Exercises  Shoulder      Shoulder Exercises: Supine   Protraction  PROM;5 reps;AROM;15 reps    Horizontal ABduction  PROM;5 reps;AROM;15 reps    External Rotation  PROM;5 reps;AROM;15 reps    Internal Rotation  PROM;5 reps;AROM;15 reps    Flexion  PROM;5 reps;AROM;15 reps    ABduction  PROM;5 reps;AROM;15 reps      Shoulder Exercises: ROM/Strengthening   X to V Arms  15 times     Proximal Shoulder Strengthening, Seated  12X each no rest breaks      Manual Therapy   Manual Therapy  Myofascial release    Manual therapy comments  manual therapy completed seperately from all other interventions    Myofascial Release  myofascial release and manual stretching to bilateral upper arms, scapular, shoulder and cervical regions to decrease pain and fascial restrictions and improve pain free mobility          Balance Exercises - 08/20/17 1501      Balance Exercises: Standing   Tandem Stance  Eyes open;1 rep;15 secs    SLS  Eyes open;1 rep;30 secs          OT Short Term Goals - 08/13/17 1355      OT SHORT TERM GOAL #1   Title  Pt will be provided with and educated on HEP for improved mobility in BUE.     Time  4    Period  Weeks    Status  On-going      OT SHORT TERM GOAL #2   Title  Pt will decrease BUE fascial restrictions from mod/max to minimal amounts or less to improve mobility required for functional reaching.     Time  4    Period  Weeks    Status  On-going      OT SHORT TERM GOAL #3   Title  Pt will decrease pain in BUE to 3/10 or less to improve completion of ADL tasks with minimal compensatory strategies.     Time  4    Period  Weeks    Status  On-going      OT SHORT TERM GOAL #4   Title  Pt will improve BUE ROM to [facility] to improve ability to reach behind back for seatbelts and to fasten bra.     Time  4    Period  Weeks    Status  Partially Met       OT SHORT TERM GOAL #5   Title  Pt will improve BUE strength to 4+/5 to increase ability to complete yardwork and housework tasks.     Time  4    Period  Weeks    Status  Partially Met               Plan - 08/20/17 1555    Clinical Impression Statement  A: patient reports pain in left shoulder during exercises this session. Required VC for form and technique. ROM is functional. Unable to tolerate stretching full range for flexion due to pain on left.     Plan  P: Continue to focus on shoulder strengthening and stability. Resume scapular strengthening. Add/resume IR stretch.    Consulted and Agree with Plan of Care  Patient       Patient will benefit from skilled therapeutic intervention in order to improve the following deficits and impairments:  Impaired flexibility, Decreased strength, Decreased activity tolerance, Pain, Impaired UE functional use, Increased fascial restrictions, Decreased range of motion  Visit Diagnosis: Chronic right shoulder pain  Acute pain of left shoulder  Stiffness of left shoulder, not elsewhere classified  Other symptoms and signs involving the musculoskeletal system  Stiffness of right shoulder, not elsewhere classified    Problem List Patient Active Problem List   Diagnosis Date Noted  . Hypothyroidism, postop 11/19/2016  . Cyst in hand 08/23/2014  . Preventative health care 07/10/2014  . Attention deficit disorder 07/10/2014  . Obesity (BMI 30-39.9) 12/26/2013  . GERD (gastroesophageal reflux disease) 11/17/2012  . Depression with anxiety 10/28/2010  . INSOMNIA 08/07/2010  . ALLERGIC RHINITIS 11/30/2008  . OSTEOPENIA 03/10/2008  . Hyperlipidemia 04/19/2007  . Left carotid bruit 04/19/2007  . Essential hypertension 04/07/2007  . Osteoarthritis 04/07/2007   Ailene Ravel, OTR/L,CBIS  [phone]  08/20/2017, 3:59 PM  [facility] [address] [place], [place], [address] Phone:  [phone]   Fax:  [phone]  Name: [name] MRN: [id number] Date of Birth: 04-16-1941

## 2017-08-20 NOTE — Therapy (Addendum)
[place], [place], [address] Phone: [phone]   Fax:  [phone]  Physical Therapy Evaluation  Patient Details  Name: [name] MRN: [id number] Date of Birth: 31-May-1941 Referring Provider: Elsie Saas MD   Encounter Date: 08/20/2017  PT End of Session - 08/20/17 1505    Visit Number  1    Number of Visits  9    Date for PT Re-Evaluation  09/17/17    Authorization Type  [employer] Part [employer], BCBS Supplement    Authorization Time Period  08/20/2017-09/17/2016    Authorization - Visit Number  1    Authorization - Number of Visits  10    PT Start Time  1350    PT Stop Time  1430    PT Time Calculation (min)  40 min    Activity Tolerance  Patient tolerated treatment well    Behavior During Therapy  [facility] for tasks assessed/performed       Past Medical History:  Diagnosis Date  . ALLERGIC RHINITIS 11/30/2008  . ALLERGIC RHINITIS 11/30/2008  . ASTHMA UNSPECIFIED WITH EXACERBATION 02/17/2008  . CAROTID BRUIT, LEFT 04/19/2007  . Closed fracture of lateral malleolus 06/04/2009  . CLOSED FRACTURE OF METATARSAL BONE 06/04/2009  . Esophageal reflux 11/08/2008  . GANGLION CYST 04/19/2007  . HYPERLIPIDEMIA NEC/NOS 04/19/2007  . HYPERTENSION 04/07/2007  . INSOMNIA 08/07/2010  . OSTEOARTHRITIS 04/07/2007  . OSTEOPENIA 03/10/2008  . UTI 07/20/2009    Past Surgical History:  Procedure Laterality Date  . knuckle replacement    . TOTAL HIP ARTHROPLASTY    . TUBAL LIGATION      There were no vitals filed for this visit.   Subjective Assessment - 08/20/17 1354    Subjective  Pt reports she is unable to stand. Within the 4 years she has fallen quite a few times. She had a hip replacement in 2003 on the Lt side and she fell and landed on her left side in April. Xray noted everything ws fine. 2-3 weeks ago she returned to [facility] because of hip pain. She notes that her left hip will hurt right at the inceision but mostly when she first  stands up. Last week fell 3 times, notes she felt legs gave out. denies hitting her head, hit her shoulders, and denies any other injuries.     Pertinent History  2003 L hip replacement     Limitations  Sitting;Lifting;Walking;Standing    How long can you sit comfortably?  no limit    How long can you stand comfortably?  does not limit but has irritation     How long can you walk comfortably?  does not limit but has irritation     Diagnostic tests  april 2018 last xray of Lt. hip - from fall in Oct 2017    Patient Stated Goals  To improve hip pain and decrease falling    Currently in Pain?  Yes    Pain Score  1     Pain Location  Hip    Pain Descriptors / Indicators  Burning    Pain Type  Chronic pain    Pain Radiating Towards  none    Pain Onset  1 to 4 weeks ago    Pain Frequency  Constant    Aggravating Factors   initial standing up from chair, vacuum, housework     Pain Relieving Factors  nothing for hip, educate on trial of heat  [facility] PT Assessment - 08/20/17 0001      Assessment   Medical Diagnosis  Lumbar OA, stenosis    Referring Provider  Elsie Saas MD      Balance Screen   Has the patient fallen in the past 6 months  Yes    How many times?  5+    Has the patient had a decrease in activity level because of a fear of falling?   Yes    Is the patient reluctant to leave their home because of a fear of falling?   No      Prior Function   Level of Independence  Independent with basic ADLs      Cognition   Overall Cognitive Status  Within Functional Limits for tasks assessed      Functional Tests   Functional tests  Single leg stance      Single Leg Stance   Comments  Lt 2-3 seconds, Rt. 1 second; Min-Mod Assist 8 seconds       Posture/Postural Control   Posture/Postural Control  Postural limitations    Postural Limitations  Rounded Shoulders;Forward head;Increased thoracic kyphosis      AROM   AROM Assessment Site  Lumbar    Lumbar Flexion  no  limitation    Lumbar Extension  slight limitation, pain Lt glute    Lumbar - Right Side Bend  Slight limitation, pain Lt glute    Lumbar - Left Side Bend  slight limitation    Lumbar - Right Rotation  severe limitation    Lumbar - Left Rotation  severe limitation      Strength   Strength Assessment Site  Lumbar    Right/Left Hip  Right;Left    Right Hip Flexion  5/5    Right Hip Extension  4/5    Right Hip External Rotation   5/5    Right Hip Internal Rotation  5/5    Right Hip ABduction  4+/5    Right Hip ADduction  4-/5    Left Hip Flexion  4/5    Left Hip Extension  4/5    Left Hip External Rotation  4/5    Left Hip Internal Rotation  4-/5    Left Hip ABduction  4-/5    Left Hip ADduction  4/5    Right/Left Knee  Right;Left    Right Knee Flexion  5/5    Right Knee Extension  5/5    Left Knee Flexion  5/5    Left Knee Extension  5/5    Right Ankle Dorsiflexion  5/5    Left Ankle Dorsiflexion  5/5    Lumbar Flexion  --      Flexibility   Soft Tissue Assessment /Muscle Length  yes    Hamstrings  Lt moderate limitation, Rt. minimal limitation    Quadriceps  bilateral severe limitation    Piriformis  Lt moderate limitation      Special Tests   Lumbar Tests  Straight Leg Raise;other      Straight Leg Raise   Findings  Positive    Side   Left    Comment  Rt. negative      other   Findings  Negative    Side   Left    Comments  FABER       Berg Balance Test   Sit to Stand  Able to stand without using hands and stabilize independently    Standing Unsupported  Able to stand safely 2  minutes    Sitting with Back Unsupported but Feet Supported on Floor or Stool  Able to sit safely and securely 2 minutes    Stand to Sit  Sits safely with minimal use of hands    Transfers  Able to transfer safely, definite need of hands    Standing Unsupported with Eyes Closed  Able to stand 10 seconds safely    Standing Ubsupported with Feet Together  Able to place feet together  independently and stand 1 minute safely    From Standing, Reach Forward with Outstretched Arm  Can reach forward >5 cm safely (2")    From Standing Position, Pick up Object from Floor  Able to pick up shoe safely and easily    From Standing Position, Turn to Look Behind Over each Shoulder  Looks behind from both sides and weight shifts well    Turn 360 Degrees  Able to turn 360 degrees safely in 4 seconds or less    Standing Unsupported, Alternately Place Feet on Step/Stool  Able to stand independently and safely and complete 8 steps in 20 seconds    Standing Unsupported, One Foot in Front  Able to take small step independently and hold 30 seconds    Standing on One Leg  Tries to lift leg/unable to hold 3 seconds but remains standing independently    Total Score  48             Objective measurements completed on examination: See above findings.      [facility] Adult PT Treatment/Exercise - 08/20/17 0001      Lumbar Exercises: Stretches   Active Hamstring Stretch  3 reps;30 seconds    Active Hamstring Stretch Limitations  seated; trial on step next session secondary to form    Quad Stretch  3 reps;30 seconds    Quad Stretch Limitations  Added to HEP, not completed this sesion      Lumbar Exercises: Supine   Bridge  10 reps          Balance Exercises - 08/20/17 1501      Balance Exercises: Standing   Tandem Stance  Eyes open;1 rep;15 secs    SLS  Eyes open;1 rep;30 secs        PT Education - 08/20/17 1504    Education provided  Yes    Education Details  Examination findings, direction of POC, HEP for balance, flexibility and initation of core/hip strengthening. Pt educated on what contributes to balance deficits and what can provide assistance to improve to decrease fall risks. Educated on hip strength, core strength and muscle tightness contributes to current symptoms.     Person(s) Educated  Patient    Methods  Explanation;Handout;Demonstration    Comprehension   Verbalized understanding       PT Short Term Goals - 08/20/17 1510      PT SHORT TERM GOAL #1   Title  Pt will be independent with HEP to improve overall pain management and prevent reoccurring injury.     Time  2    Period  Weeks    Status  New    Target Date  09/03/17      PT SHORT TERM GOAL #2   Title  Pt will demonstrate improved ROM without increased symptoms to improve functional mobility during ADLs.     Time  2    Period  Weeks    Status  New      PT SHORT TERM GOAL #3  Title  Pt will be able to maintain desired posture alignment to decrease strain on lumbar spine to improve quality of life.     Time  2    Period  Weeks    Status  New      PT SHORT TERM GOAL #4   Title  Pt will have increased hip strength by 1 MMT to improve stability and overall functional strength for ADLs.     Time  2    Period  Weeks    Status  New        PT Long Term Goals - 08/20/17 1512      PT LONG TERM GOAL #1   Title  Pt will have improved BERG balance score 53/56 to indicate decreased risk of falls.     Time  4    Period  Weeks    Status  New    Target Date  09/17/17      PT LONG TERM GOAL #2   Title  Pt will have decreased reports of falls to at most 1 over two week period.    Time  4    Period  Weeks    Status  New      PT LONG TERM GOAL #3   Title  Pt will have improved hip strength by 2 MMT grades to improve stability during ambulation and functional ADLs.     Time  4    Period  Weeks    Status  New      PT LONG TERM GOAL #5   Title  Pt will demonstrate proper lifting mechanics to pick object off the floor at home to improve protection of lumbar spine during ADLs.    Time  4    Period  Weeks    Status  New             Plan - 08/20/17 1518    Clinical Impression Statement  Pt is a warm and pleasant 76 year old woman presenting to [facility] after history of frequent falls, with 3 occurring within the past week. Patient has a history of left hip replacement that  was examined and determined cleared by MD during recent follow up. Pt presents with moderate to severe muscle tightness of the hamstring and quadriceps bilaterally, hip weakness, decreased core strength/stability, positive SLR testing into left glute, tight piriformis left side, and severe balance deficits. Patient will benefit from OPPT to address the muscle tightness and weakness causing irritation to the left sciatic nerve distribution, as well as, balance training to decrease risk and number of falls to improve safety and confidence to leave house and remain active in her environment.     History and Personal Factors relevant to plan of care:  Left hip replacement, fall risk (2-3/week)    Clinical Presentation  Evolving    Clinical Decision Making  Low    Rehab Potential  Good    PT Frequency  2x / week    PT Duration  4 weeks    PT Treatment/Interventions  ADLs/Self Care Home Management;Moist Heat;Balance training;Therapeutic exercise;Therapeutic activities;Functional mobility training;Stair training;Gait training;Neuromuscular re-education;Patient/family education    PT Next Visit Plan  Review initial evaluation and goals; continue focus on core strength, hip strength and dynamic balance - review seated hamstring stretch or change to standing and add to HEP.     PT Home Exercise Plan  eval: bridge, quad stretch, tandem and SL balance     Recommended Other Services  none at this time, currently getting OT       Patient will benefit from skilled therapeutic intervention in order to improve the following deficits and impairments:  Abnormal gait, Pain, Improper body mechanics, Postural dysfunction, Decreased activity tolerance, Decreased strength, Impaired flexibility, Difficulty walking, Decreased balance  Visit Diagnosis: Acute left-sided low back pain with left-sided sciatica  Impaired functional mobility, balance, gait, and endurance  Decreased strength  Posture abnormality  G-Codes -  09/16/2017 1527    Functional Assessment Tool Used (Outpatient Only)  Based on PT clinical judgement, strength and BERG balance testing    Functional Limitation  Mobility: Walking and moving around    Mobility: Walking and Moving Around Current Status ([id number])  At least 40 percent but less than 60 percent impaired, limited or restricted    Mobility: Walking and Moving Around Goal Status [phone])  At least 20 percent but less than 40 percent impaired, limited or restricted        Problem List Patient Active Problem List   Diagnosis Date Noted  . Hypothyroidism, postop 11/19/2016  . Cyst in hand 08/23/2014  . Preventative health care 07/10/2014  . Attention deficit disorder 07/10/2014  . Obesity (BMI 30-39.9) 12/26/2013  . GERD (gastroesophageal reflux disease) 11/17/2012  . Depression with anxiety 10/28/2010  . INSOMNIA 08/07/2010  . ALLERGIC RHINITIS 11/30/2008  . OSTEOPENIA 03/10/2008  . Hyperlipidemia 04/19/2007  . Left carotid bruit 04/19/2007  . Essential hypertension 04/07/2007  . Osteoarthritis 04/07/2007   Starr Lake PT, DPT 3:31 PM, 09/16/17 [place], [place], [address] Phone: [phone]   Fax:  [phone]  Name: [name] MRN: [id number] Date of Birth: [birth year]

## 2017-08-24 ENCOUNTER — Ambulatory Visit (HOSPITAL_COMMUNITY): Payer: Medicare Other

## 2017-08-27 ENCOUNTER — Encounter (HOSPITAL_COMMUNITY): Payer: Self-pay | Admitting: Occupational Therapy

## 2017-08-27 ENCOUNTER — Encounter (HOSPITAL_COMMUNITY): Payer: Self-pay

## 2017-08-27 ENCOUNTER — Other Ambulatory Visit: Payer: Self-pay

## 2017-08-27 ENCOUNTER — Ambulatory Visit (HOSPITAL_COMMUNITY): Payer: Medicare Other

## 2017-08-27 ENCOUNTER — Ambulatory Visit (HOSPITAL_COMMUNITY): Payer: Medicare Other | Admitting: Occupational Therapy

## 2017-08-27 DIAGNOSIS — R29898 Other symptoms and signs involving the musculoskeletal system: Secondary | ICD-10-CM | POA: Diagnosis not present

## 2017-08-27 DIAGNOSIS — M25611 Stiffness of right shoulder, not elsewhere classified: Secondary | ICD-10-CM | POA: Diagnosis not present

## 2017-08-27 DIAGNOSIS — R531 Weakness: Secondary | ICD-10-CM

## 2017-08-27 DIAGNOSIS — R293 Abnormal posture: Secondary | ICD-10-CM

## 2017-08-27 DIAGNOSIS — M25612 Stiffness of left shoulder, not elsewhere classified: Secondary | ICD-10-CM

## 2017-08-27 DIAGNOSIS — G8929 Other chronic pain: Secondary | ICD-10-CM

## 2017-08-27 DIAGNOSIS — M25511 Pain in right shoulder: Secondary | ICD-10-CM | POA: Diagnosis not present

## 2017-08-27 DIAGNOSIS — M25512 Pain in left shoulder: Secondary | ICD-10-CM | POA: Diagnosis not present

## 2017-08-27 DIAGNOSIS — M5442 Lumbago with sciatica, left side: Secondary | ICD-10-CM

## 2017-08-27 DIAGNOSIS — Z7409 Other reduced mobility: Secondary | ICD-10-CM

## 2017-08-27 NOTE — Patient Instructions (Signed)
Hamstring Stretch    With other leg bent, foot flat, grasp right leg and slowly try to straighten knee. Hold 30 seconds. Repeat 3 times. Do 2 sessions per day.  http://gt2.exer.us/280   Copyright  VHI. All rights reserved.   Bridge    Lie back, legs bent. Inhale, pressing hips up. Keeping ribs in, lengthen lower back. Exhale, rolling down along spine from top. Repeat 10 times. Do 1-2 sessions per day.  http://pm.exer.us/55   Copyright  VHI. All rights reserved.   KNEE: Quadriceps - Prone    Place strap around ankle. Bring ankle toward buttocks. Press hip into surface. Hold 30 seconds.3 reps per set, 1-2 sets per day.  Copyright  VHI. All rights reserved.   Tandem Stance    Right foot in front of left, heel touching toe both feet "straight ahead". Stand on Foot Triangle of Support with both feet. Balance in this position 30 seconds. Do with left foot in front of right.  Copyright  VHI. All rights reserved.   Single Leg Balance: Eyes Open    Stand on right leg with eyes open. Hold 30 seconds. 5 reps 1-2 times per day.  http://ggbe.exer.us/5   Copyright  VHI. All rights reserved.

## 2017-08-27 NOTE — Therapy (Signed)
[place], [place], [address] Phone: [phone]   Fax:  [phone]  Physical Therapy Treatment  Patient Details  Name: [name] MRN: [id number] Date of Birth: 1941/08/16 Referring Provider: Elsie Saas MD   Encounter Date: 08/27/2017  PT End of Session - 08/27/17 1355    Visit Number  2    Number of Visits  8    Date for PT Re-Evaluation  09/17/17    Authorization Type  [employer] Part A, [employer]BS Supplement    Authorization Time Period  08/20/2017-09/17/2016    Authorization - Visit Number  2    Authorization - Number of Visits  10    PT Start Time  1350    PT Stop Time  1601    PT Time Calculation (min)  43 min    Activity Tolerance  Patient tolerated treatment well;No increased pain    Behavior During Therapy  WFL for tasks assessed/performed       Past Medical History:  Diagnosis Date  . ALLERGIC RHINITIS 11/30/2008  . ALLERGIC RHINITIS 11/30/2008  . ASTHMA UNSPECIFIED WITH EXACERBATION 02/17/2008  . CAROTID BRUIT, LEFT 04/19/2007  . Closed fracture of lateral malleolus 06/04/2009  . CLOSED FRACTURE OF METATARSAL BONE 06/04/2009  . Esophageal reflux 11/08/2008  . GANGLION CYST 04/19/2007  . HYPERLIPIDEMIA NEC/NOS 04/19/2007  . HYPERTENSION 04/07/2007  . INSOMNIA 08/07/2010  . OSTEOARTHRITIS 04/07/2007  . OSTEOPENIA 03/10/2008  . UTI 07/20/2009    Past Surgical History:  Procedure Laterality Date  . knuckle replacement    . TOTAL HIP ARTHROPLASTY    . TUBAL LIGATION      There were no vitals filed for this visit.  Subjective Assessment - 08/27/17 1351    Subjective  Pt reports left hip doesn't hurt unless she stands or adds weight bearing while in the bed.  No reports of current pain though is sitting.      Patient Stated Goals  To improve hip pain and decrease falling    Currently in Pain?  No/denies                      [facility] Adult PT Treatment/Exercise - 08/27/17 1532      Bed  Mobility   Bed Mobility  Right Sidelying to Sit;Left Sidelying to Sit    Right Sidelying to Sit  5: Supervision    Right Sidelying to Sit Details (indicate cue type and reason)  educated on proper bed mobility for ease and pain control, continue to educate appropraite form    Left Sidelying to Sit  5: Supervision    Left Sidelying to Sit Details (indicate cue type and reason)  educated on proper bed mobility for ease and pain control, continue to educate appropraite form      Lumbar Exercises: Supine   Bridge  10 reps      Knee/Hip Exercises: Sidelying   Hip ABduction  10 reps           PROM;5 reps;AROM;15 reps      PROM;5 reps;AROM;15 reps      PROM;5 reps;AROM;15 reps      PROM;5 reps;AROM;15 reps      PROM;5 reps;AROM;15 reps      PROM;5 reps;AROM;15 reps           AROM;15 reps      AROM;15 reps      AROM;15 reps      AROM;15 reps  AROM;15 reps      AROM;15 reps           15 times       12X each no rest breaks      12X each no rest breaks           -- 10 seconds           Myofascial release      manual therapy completed seperately from all other interventions      myofascial release and manual stretching to bilateral upper arms, scapular, shoulder and cervical regions to decrease pain and fascial restrictions and improve pain free mobility          Balance Exercises - 08/27/17 1534      Balance Exercises: Standing   Tandem Stance  Eyes open;3 reps;30 secs    SLS  Eyes open;5 reps L6", Rt 8"        PT Education - 08/27/17 1402    Education provided  Yes    Education Details  Reviewed goals, educated importance of compliance and reviewed HEP, copy of eval given to pt.      Person(s) Educated  Patient    Methods  Explanation;Demonstration;Handout    Comprehension  Verbalized understanding       PT Short Term Goals - 08/20/17 1510      PT SHORT TERM GOAL #1   Title  Pt will be independent with HEP to improve overall pain management and prevent  reoccurring injury.     Time  2    Period  Weeks    Status  New    Target Date  09/03/17      PT SHORT TERM GOAL #2   Title  Pt will demonstrate improved ROM without increased symptoms to improve functional mobility during ADLs.     Time  2    Period  Weeks    Status  New      PT SHORT TERM GOAL #3   Title  Pt will be able to maintain desired posture alignment to decrease strain on lumbar spine to improve quality of life.     Time  2    Period  Weeks    Status  New      PT SHORT TERM GOAL #4   Title  Pt will have increased hip strength by 1 MMT to improve stability and overall functional strength for ADLs.     Time  2    Period  Weeks    Status  New        PT Long Term Goals - 08/20/17 1512      PT LONG TERM GOAL #1   Title  Pt will have improved BERG balance score 53/56 to indicate decreased risk of falls.     Time  4    Period  Weeks    Status  New    Target Date  09/17/17      PT LONG TERM GOAL #2   Title  Pt will have decreased reports of falls to at most 1 over two week period.    Time  4    Period  Weeks    Status  New      PT LONG TERM GOAL #3   Title  Pt will have improved hip strength by 2 MMT grades to improve stability during ambulation and functional ADLs.     Time  4    Period  Weeks    Status  New  PT LONG TERM GOAL #5   Title  Pt will demonstrate proper lifting mechanics to pick object off the floor at home to improve protection of lumbar spine during ADLs.    Time  4    Period  Weeks    Status  New            Plan - 08/27/17 1437    Clinical Impression Statement  Reviewed goals, educated importance of compliance iwth HEP and reviewed exercises with copy of eval given to pt.  Session focus on proximal hip musculautre strengthening/stretching and balance training.  Reviewed/educated mechanics to improve bed mobility (will need to review next session) to assist with pain and ease with task.  Therapist facilitation for proper form with  therex for appropraite muscle activation.  Min A and cueing to improve spatial awareness to assist with balance activites.  EOS pt reports hip feels better with improve weight distribution with gait.      Rehab Potential  Good    PT Frequency  2x / week    PT Duration  4 weeks    PT Treatment/Interventions  ADLs/Self Care Home Management;Moist Heat;Balance training;Therapeutic exercise;Therapeutic activities;Functional mobility training;Stair training;Gait training;Neuromuscular re-education;Patient/family education    PT Next Visit Plan   continue focus on core strength, hip strength and dynamic balance;  Begin sidestep next session and rockerboard.    PT Home Exercise Plan  eval: bridge, quad stretch, tandem and SL balance, hamstring st       Patient will benefit from skilled therapeutic intervention in order to improve the following deficits and impairments:  Abnormal gait, Pain, Improper body mechanics, Postural dysfunction, Decreased activity tolerance, Decreased strength, Impaired flexibility, Difficulty walking, Decreased balance  Visit Diagnosis: Posture abnormality  Decreased strength  Acute left-sided low back pain with left-sided sciatica  Impaired functional mobility, balance, gait, and endurance     Problem List Patient Active Problem List   Diagnosis Date Noted  . Hypothyroidism, postop 11/19/2016  . Cyst in hand 08/23/2014  . Preventative health care 07/10/2014  . Attention deficit disorder 07/10/2014  . Obesity (BMI 30-39.9) 12/26/2013  . GERD (gastroesophageal reflux disease) 11/17/2012  . Depression with anxiety 10/28/2010  . INSOMNIA 08/07/2010  . ALLERGIC RHINITIS 11/30/2008  . OSTEOPENIA 03/10/2008  . Hyperlipidemia 04/19/2007  . Left carotid bruit 04/19/2007  . Essential hypertension 04/07/2007  . Osteoarthritis 04/07/2007   Ihor Austin, LPTA; [place]  Aldona Lento 08/27/2017, 3:36 PM  [place], [place], [address] Phone: [phone]   Fax:  [phone]  Name: [name] MRN: [id number] Date of Birth: [birth year]

## 2017-08-27 NOTE — Therapy (Signed)
[place], [place], [address] Phone: [phone]   Fax:  [phone]  Occupational Therapy Treatment  Patient Details  Name: [name] MRN: [id number] Date of Birth: 1941/06/14 Referring Provider (Historical): Dr. Elsie Saas   Encounter Date: 08/27/2017  OT End of Session - 08/27/17 1344    Visit Number  10    Number of Visits  15    Date for OT Re-Evaluation  09/12/17    Authorization Type  1) Medicare A & B 2) BCBS    Authorization Time Period  Before 17th visit    Authorization - Visit Number  10    Authorization - Number of Visits  17    OT Start Time  1302    OT Stop Time  1343    OT Time Calculation (min)  41 min    Activity Tolerance  Patient tolerated treatment well    Behavior During Therapy  [facility] for tasks assessed/performed       Past Medical History:  Diagnosis Date  . ALLERGIC RHINITIS 11/30/2008  . ALLERGIC RHINITIS 11/30/2008  . ASTHMA UNSPECIFIED WITH EXACERBATION 02/17/2008  . CAROTID BRUIT, LEFT 04/19/2007  . Closed fracture of lateral malleolus 06/04/2009  . CLOSED FRACTURE OF METATARSAL BONE 06/04/2009  . Esophageal reflux 11/08/2008  . GANGLION CYST 04/19/2007  . HYPERLIPIDEMIA NEC/NOS 04/19/2007  . HYPERTENSION 04/07/2007  . INSOMNIA 08/07/2010  . OSTEOARTHRITIS 04/07/2007  . OSTEOPENIA 03/10/2008  . UTI 07/20/2009    Past Surgical History:  Procedure Laterality Date  . knuckle replacement    . TOTAL HIP ARTHROPLASTY    . TUBAL LIGATION      There were no vitals filed for this visit.  Subjective Assessment - 08/27/17 1302    Subjective   S: My arms are feeling a whole lot better.     Currently in Pain?  No/denies         [facility] OT Assessment - 08/27/17 1301      Assessment   Medical Diagnosis  bilateral shoulder RTC tendonitis and arthritis      Precautions   Precautions  Shoulder    Type of Shoulder Precautions  Progress as tolerated               OT  Treatments/Exercises (OP) - 08/27/17 1306      Exercises   Exercises  Shoulder      Shoulder Exercises: Supine   Protraction  PROM;5 reps;AROM;15 reps    Horizontal ABduction  PROM;5 reps;AROM;15 reps    External Rotation  PROM;5 reps;AROM;15 reps    Internal Rotation  PROM;5 reps;AROM;15 reps    Flexion  PROM;5 reps;AROM;15 reps    ABduction  PROM;5 reps;AROM;15 reps      Shoulder Exercises: Seated   Protraction  AROM;15 reps    Horizontal ABduction  AROM;15 reps    External Rotation  AROM;15 reps    Internal Rotation  AROM;15 reps    Flexion  AROM;15 reps    Abduction  AROM;15 reps      Shoulder Exercises: Standing   Extension  Theraband;10 reps    Theraband Level (Shoulder Extension)  Level 2 (Red)    Row  Theraband;10 reps    Theraband Level (Shoulder Row)  Level 2 (Red)    Retraction  Theraband;10 reps    Theraband Level (Shoulder Retraction)  Level 2 (Red)      Shoulder Exercises: ROM/Strengthening   X to V Arms  15 times  Proximal Shoulder Strengthening, Supine  12X each no rest breaks    Proximal Shoulder Strengthening, Seated  12X each no rest breaks      Shoulder Exercises: Stretch   Internal Rotation Stretch  3 reps 10 seconds      Manual Therapy   Manual Therapy  Myofascial release    Manual therapy comments  manual therapy completed seperately from all other interventions    Myofascial Release  myofascial release and manual stretching to bilateral upper arms, scapular, shoulder and cervical regions to decrease pain and fascial restrictions and improve pain free mobility               OT Short Term Goals - 08/13/17 1355      OT SHORT TERM GOAL #1   Title  Pt will be provided with and educated on HEP for improved mobility in BUE.     Time  4    Period  Weeks    Status  On-going      OT SHORT TERM GOAL #2   Title  Pt will decrease BUE fascial restrictions from mod/max to minimal amounts or less to improve mobility required for functional  reaching.     Time  4    Period  Weeks    Status  On-going      OT SHORT TERM GOAL #3   Title  Pt will decrease pain in BUE to 3/10 or less to improve completion of ADL tasks with minimal compensatory strategies.     Time  4    Period  Weeks    Status  On-going      OT SHORT TERM GOAL #4   Title  Pt will improve BUE ROM to [facility] to improve ability to reach behind back for seatbelts and to fasten bra.     Time  4    Period  Weeks    Status  Partially Met      OT SHORT TERM GOAL #5   Title  Pt will improve BUE strength to 4+/5 to increase ability to complete yardwork and housework tasks.     Time  4    Period  Weeks    Status  Partially Met               Plan - 08/27/17 1344    Clinical Impression Statement  A: Pt reporting hip pain today, no shoulder pain. Continued with shoulder ROM and stability exercises, intermittent verbal cuing for form and technique. Resumed scapular theraband, minimal difficulty today.     Plan  P: Continue with shoulder strengthening and shoulder stretches, add IR stretch and flexion stretch to HEP. Attempt ball on wall       Patient will benefit from skilled therapeutic intervention in order to improve the following deficits and impairments:  Impaired flexibility, Decreased strength, Decreased activity tolerance, Pain, Impaired UE functional use, Increased fascial restrictions, Decreased range of motion  Visit Diagnosis: Chronic right shoulder pain  Acute pain of left shoulder  Stiffness of left shoulder, not elsewhere classified  Other symptoms and signs involving the musculoskeletal system  Stiffness of right shoulder, not elsewhere classified    Problem List Patient Active Problem List   Diagnosis Date Noted  . Hypothyroidism, postop 11/19/2016  . Cyst in hand 08/23/2014  . Preventative health care 07/10/2014  . Attention deficit disorder 07/10/2014  . Obesity (BMI 30-39.9) 12/26/2013  . GERD (gastroesophageal reflux disease)  11/17/2012  . Depression with anxiety 10/28/2010  . INSOMNIA  08/07/2010  . ALLERGIC RHINITIS 11/30/2008  . OSTEOPENIA 03/10/2008  . Hyperlipidemia 04/19/2007  . Left carotid bruit 04/19/2007  . Essential hypertension 04/07/2007  . Osteoarthritis 04/07/2007   Guadelupe Sabin, OTR/L  [phone] 08/27/2017, 1:47 PM  [place] [address] [place], [place], [address] Phone: [phone]   Fax:  [phone]  Name: [name] MRN: [id number] Date of Birth: 10-Jan-1941

## 2017-08-31 ENCOUNTER — Encounter (HOSPITAL_COMMUNITY): Payer: Self-pay

## 2017-08-31 ENCOUNTER — Other Ambulatory Visit: Payer: Self-pay

## 2017-08-31 ENCOUNTER — Ambulatory Visit (HOSPITAL_COMMUNITY): Payer: Medicare Other | Admitting: Occupational Therapy

## 2017-08-31 ENCOUNTER — Ambulatory Visit (HOSPITAL_COMMUNITY): Payer: Medicare Other

## 2017-08-31 ENCOUNTER — Encounter (HOSPITAL_COMMUNITY): Payer: Self-pay | Admitting: Occupational Therapy

## 2017-08-31 DIAGNOSIS — R531 Weakness: Secondary | ICD-10-CM

## 2017-08-31 DIAGNOSIS — M25612 Stiffness of left shoulder, not elsewhere classified: Secondary | ICD-10-CM

## 2017-08-31 DIAGNOSIS — M5442 Lumbago with sciatica, left side: Secondary | ICD-10-CM

## 2017-08-31 DIAGNOSIS — Z7409 Other reduced mobility: Secondary | ICD-10-CM

## 2017-08-31 DIAGNOSIS — M25511 Pain in right shoulder: Secondary | ICD-10-CM | POA: Diagnosis not present

## 2017-08-31 DIAGNOSIS — M25611 Stiffness of right shoulder, not elsewhere classified: Secondary | ICD-10-CM | POA: Diagnosis not present

## 2017-08-31 DIAGNOSIS — R29898 Other symptoms and signs involving the musculoskeletal system: Secondary | ICD-10-CM | POA: Diagnosis not present

## 2017-08-31 DIAGNOSIS — M25512 Pain in left shoulder: Secondary | ICD-10-CM | POA: Diagnosis not present

## 2017-08-31 DIAGNOSIS — G8929 Other chronic pain: Secondary | ICD-10-CM | POA: Diagnosis not present

## 2017-08-31 NOTE — Therapy (Addendum)
[place], [place], [address] Phone: [phone]   Fax:  [phone]  Physical Therapy Treatment  Patient Details  Name: [name] MRN: [id number] Date of Birth: [birth year] Referring Provider: Elsie Saas MD   Encounter Date: 08/31/2017  PT End of Session - 08/31/17 1609    Visit Number  3    Number of Visits  8    Date for PT Re-Evaluation  09/17/17    Authorization Type  [employer] Part A, BCBS Supplement    Authorization Time Period  08/20/2017-09/17/2017    Authorization - Visit Number  3    Authorization - Number of Visits  10    PT Start Time  1300    PT Stop Time  1340    PT Time Calculation (min)  40 min    Activity Tolerance  Patient tolerated treatment well;No increased pain    Behavior During Therapy  WFL for tasks assessed/performed       Past Medical History:  Diagnosis Date  . ALLERGIC RHINITIS 11/30/2008  . ALLERGIC RHINITIS 11/30/2008  . ASTHMA UNSPECIFIED WITH EXACERBATION 02/17/2008  . CAROTID BRUIT, LEFT 04/19/2007  . Closed fracture of lateral malleolus 06/04/2009  . CLOSED FRACTURE OF METATARSAL BONE 06/04/2009  . Esophageal reflux 11/08/2008  . GANGLION CYST 04/19/2007  . HYPERLIPIDEMIA NEC/NOS 04/19/2007  . HYPERTENSION 04/07/2007  . INSOMNIA 08/07/2010  . OSTEOARTHRITIS 04/07/2007  . OSTEOPENIA 03/10/2008  . UTI 07/20/2009    Past Surgical History:  Procedure Laterality Date  . knuckle replacement    . TOTAL HIP ARTHROPLASTY    . TUBAL LIGATION      There were no vitals filed for this visit.  Subjective Assessment - 08/31/17 1604    Subjective  Patient states that she is not currently having pain, but that occassionally in the left hip her pain will be a 10/10 when she "steps on it the wrong way".     Limitations  Standing;Walking    Currently in Pain?  No/denies Patient states she's not currently in pain except occassionally when she puts weight on her left leg and her pain is a  10/10 at that point.     Pain Location  Hip    Pain Orientation  Left                      [facility] Adult PT Treatment/Exercise - 08/31/17 0001      Exercises   Exercises  Lumbar      Lumbar Exercises: Standing   Other Standing Lumbar Exercises  Balance with narrow base of support on airex 2 x 30 seconds with minimal upper extremity support    Other Standing Lumbar Exercises  Single leg balance x 2 each lower extremity with upper extremity support; Tandem balance on level ground 5x each lower extremity 5-10 seconds each time      Lumbar Exercises: Supine   AB Set Limitations  10 reps with 3 second holds    Clam  10 reps    Bridge  10 reps      Knee/Hip Exercises: Standing   Forward Step Up  Both;10 reps;Hand Hold: 2 Bilaterally on 4'' step with cueing to not drop hip    Other Standing Knee Exercises  Sit to stands without upper extremity x 10 from 23 inch mat      Knee/Hip Exercises: Sidelying   Hip ABduction  10 reps Cueing to maintain hip rolled forward  Manual Therapy   Manual Therapy  Soft tissue mobilization    Manual therapy comments  Manual therapy completed separately from the rest of therapy    Soft tissue mobilization  Soft tissue mobilization to quadratus lumborum and to gluteal muscles on the left lower extremity             PT Education - 08/31/17 1608    Education provided  Yes    Education Details  Educated patient this session about the purpose of performing exercises and goals to decrease pain in left hip while performing manual therapy.     Person(s) Educated  Patient    Methods  Explanation    Comprehension  Verbalized understanding       PT Short Term Goals - 08/20/17 1510      PT SHORT TERM GOAL #1   Title  Pt will be independent with HEP to improve overall pain management and prevent reoccurring injury.     Time  2    Period  Weeks    Status  New    Target Date  09/03/17      PT SHORT TERM GOAL #2   Title  Pt will  demonstrate improved ROM without increased symptoms to improve functional mobility during ADLs.     Time  2    Period  Weeks    Status  New      PT SHORT TERM GOAL #3   Title  Pt will be able to maintain desired posture alignment to decrease strain on lumbar spine to improve quality of life.     Time  2    Period  Weeks    Status  New      PT SHORT TERM GOAL #4   Title  Pt will have increased hip strength by 1 MMT to improve stability and overall functional strength for ADLs.     Time  2    Period  Weeks    Status  New        PT Long Term Goals - 08/20/17 1512      PT LONG TERM GOAL #1   Title  Pt will have improved BERG balance score 53/56 to indicate decreased risk of falls.     Time  4    Period  Weeks    Status  New    Target Date  09/17/17      PT LONG TERM GOAL #2   Title  Pt will have decreased reports of falls to at most 1 over two week period.    Time  4    Period  Weeks    Status  New      PT LONG TERM GOAL #3   Title  Pt will have improved hip strength by 2 MMT grades to improve stability during ambulation and functional ADLs.     Time  4    Period  Weeks    Status  New      PT LONG TERM GOAL #5   Title  Pt will demonstrate proper lifting mechanics to pick object off the floor at home to improve protection of lumbar spine during ADLs.    Time  4    Period  Weeks    Status  New            Plan - 08/31/17 1610    Clinical Impression Statement  This session started with a focus on balance exercises. Patient performed standing balance with narrow base of  support on airex pad, followed by single leg and tandem balance focusing on hip stabilization and not allowing the hip to drop with these exercises. Progressed to patient performing functional strengthening exercises and to supine strengthening exercises. Noted this session patient's difficulty with performing sit to stand and stand to sit transfer. Patient was educated about the importance of the sit to  stand in daily functioning and educated about how the height of the table may be lowered as the patient becomes stronger in order to challenge her more. Patient was able to perform a sit to stand without upper extremities from a mat set at 23 inches this session. Patient tolerated manual therapy well which was targeting quadratus lumborum and the gluteal muscles avoiding the incision site of previous surgery. Patient states that the manual therapy helped make it feel better. Plan to continue with progression of functional strengthening and manual therapy to decrease pain.     Rehab Potential  Good    PT Frequency  2x / week    PT Duration  4 weeks    PT Treatment/Interventions  ADLs/Self Care Home Management;Moist Heat;Balance training;Therapeutic exercise;Therapeutic activities;Functional mobility training;Stair training;Gait training;Neuromuscular re-education;Patient/family education    PT Next Visit Plan   continue focus on core strength, hip strength and dynamic balance;  Begin sidestep next session and rockerboard.    PT Home Exercise Plan  eval: bridge, quad stretch, tandem and SL balance, hamstring st    Consulted and Agree with Plan of Care  Patient       Patient will benefit from skilled therapeutic intervention in order to improve the following deficits and impairments:  Abnormal gait, Pain, Improper body mechanics, Postural dysfunction, Decreased activity tolerance, Decreased strength, Impaired flexibility, Difficulty walking, Decreased balance  Visit Diagnosis: Decreased strength  Acute left-sided low back pain with left-sided sciatica  Impaired functional mobility, balance, gait, and endurance     Problem List Patient Active Problem List   Diagnosis Date Noted  . Hypothyroidism, postop 11/19/2016  . Cyst in hand 08/23/2014  . Preventative health care 07/10/2014  . Attention deficit disorder 07/10/2014  . Obesity (BMI 30-39.9) 12/26/2013  . GERD (gastroesophageal reflux  disease) 11/17/2012  . Depression with anxiety 10/28/2010  . INSOMNIA 08/07/2010  . ALLERGIC RHINITIS 11/30/2008  . OSTEOPENIA 03/10/2008  . Hyperlipidemia 04/19/2007  . Left carotid bruit 04/19/2007  . Essential hypertension 04/07/2007  . Osteoarthritis 04/07/2007    Clarene Critchley PT, DPT 4:21 PM, 08/31/17 [place], [place], [address] Phone: [phone]   Fax:  [phone]  Name: [name] MRN: [id number] Date of Birth: 1940-09-19

## 2017-08-31 NOTE — Therapy (Signed)
[place], [place], [address] Phone: [phone]   Fax:  [phone]  Occupational Therapy Treatment  Patient Details  Name: [name] MRN: [id number] Date of Birth: [birth year] Referring Provider (Historical): Dr. Elsie Saas   Encounter Date: 08/31/2017  OT End of Session - 08/31/17 1441    Visit Number  11    Number of Visits  15    Date for OT Re-Evaluation  09/12/17    Authorization Type  1) Medicare A & B 2) BCBS    Authorization Time Period  Before 17th visit    Authorization - Visit Number  11    Authorization - Number of Visits  17    OT Start Time  2694    OT Stop Time  1433    OT Time Calculation (min)  45 min    Activity Tolerance  Patient tolerated treatment well    Behavior During Therapy  [facility] for tasks assessed/performed       Past Medical History:  Diagnosis Date  . ALLERGIC RHINITIS 11/30/2008  . ALLERGIC RHINITIS 11/30/2008  . ASTHMA UNSPECIFIED WITH EXACERBATION 02/17/2008  . CAROTID BRUIT, LEFT 04/19/2007  . Closed fracture of lateral malleolus 06/04/2009  . CLOSED FRACTURE OF METATARSAL BONE 06/04/2009  . Esophageal reflux 11/08/2008  . GANGLION CYST 04/19/2007  . HYPERLIPIDEMIA NEC/NOS 04/19/2007  . HYPERTENSION 04/07/2007  . INSOMNIA 08/07/2010  . OSTEOARTHRITIS 04/07/2007  . OSTEOPENIA 03/10/2008  . UTI 07/20/2009    Past Surgical History:  Procedure Laterality Date  . knuckle replacement    . TOTAL HIP ARTHROPLASTY    . TUBAL LIGATION      There were no vitals filed for this visit.  Subjective Assessment - 08/31/17 1346    Subjective   S: My arms are more tired than anything.     Currently in Pain?  No/denies         [facility] OT Assessment - 08/31/17 1345      Assessment   Medical Diagnosis  bilateral shoulder RTC tendonitis and arthritis      Precautions   Precautions  Shoulder    Type of Shoulder Precautions  Progress as tolerated               OT  Treatments/Exercises (OP) - 08/31/17 1353      Exercises   Exercises  Shoulder      Shoulder Exercises: Supine   Protraction  PROM;5 reps;Both    Horizontal ABduction  PROM;5 reps;Both    External Rotation  PROM;5 reps;Both    Internal Rotation  PROM;5 reps;Both    Flexion  PROM;5 reps;Both    ABduction  PROM;5 reps;Both      Shoulder Exercises: Seated   Protraction  AROM;15 reps    Horizontal ABduction  AROM;15 reps    External Rotation  AROM;15 reps    Internal Rotation  AROM;15 reps    Flexion  AROM;15 reps    Abduction  AROM;15 reps      Shoulder Exercises: Standing   Extension  Theraband;10 reps    Theraband Level (Shoulder Extension)  Level 2 (Red)    Row  Theraband;10 reps    Theraband Level (Shoulder Row)  Level 2 (Red)    Retraction  Theraband;10 reps    Theraband Level (Shoulder Retraction)  Level 2 (Red)      Shoulder Exercises: ROM/Strengthening   UBE (Upper Arm Bike)  level 1 2' forward and 2' reverse  X to V Arms  15 times     Proximal Shoulder Strengthening, Seated  12X each no rest breaks      Shoulder Exercises: Stretch   Internal Rotation Stretch  2 reps 10" each; right-vertical, left-horizontal      Manual Therapy   Manual Therapy  Myofascial release    Manual therapy comments  manual therapy completed seperately from all other interventions    Myofascial Release  myofascial release and manual stretching to bilateral upper arms, scapular, shoulder and cervical regions to decrease pain and fascial restrictions and improve pain free mobility               OT Short Term Goals - 08/13/17 1355      OT SHORT TERM GOAL #1   Title  Pt will be provided with and educated on HEP for improved mobility in BUE.     Time  4    Period  Weeks    Status  On-going      OT SHORT TERM GOAL #2   Title  Pt will decrease BUE fascial restrictions from mod/max to minimal amounts or less to improve mobility required for functional reaching.     Time  4     Period  Weeks    Status  On-going      OT SHORT TERM GOAL #3   Title  Pt will decrease pain in BUE to 3/10 or less to improve completion of ADL tasks with minimal compensatory strategies.     Time  4    Period  Weeks    Status  On-going      OT SHORT TERM GOAL #4   Title  Pt will improve BUE ROM to [facility] to improve ability to reach behind back for seatbelts and to fasten bra.     Time  4    Period  Weeks    Status  Partially Met      OT SHORT TERM GOAL #5   Title  Pt will improve BUE strength to 4+/5 to increase ability to complete yardwork and housework tasks.     Time  4    Period  Weeks    Status  Partially Met               Plan - 08/31/17 1441    Clinical Impression Statement  A: Pt reporting BUE fatigue today, believes her cold is affecting her strength. Continued with ROM and stability exercises, as well as shoulder stretches with intermittent rest breaks for fatigue. Verbal cuing for form during exercises, especially theraband. Did not add ball on wall due to pt poor balance in standing today.     Plan  P: Add additional therapy visits if able to get pt in before 29th, if not reassess. Continue with shoulder stretches, attempt ball on wall if pt able to stand for exercise. Add overhead lacing for stability and activity tolerance    Consulted and Agree with Plan of Care  Patient       Patient will benefit from skilled therapeutic intervention in order to improve the following deficits and impairments:  Impaired flexibility, Decreased strength, Decreased activity tolerance, Pain, Impaired UE functional use, Increased fascial restrictions, Decreased range of motion  Visit Diagnosis: Acute pain of left shoulder  Chronic right shoulder pain  Stiffness of left shoulder, not elsewhere classified  Other symptoms and signs involving the musculoskeletal system  Stiffness of right shoulder, not elsewhere classified    Problem List Patient  Active Problem List    Diagnosis Date Noted  . Hypothyroidism, postop 11/19/2016  . Cyst in hand 08/23/2014  . Preventative health care 07/10/2014  . Attention deficit disorder 07/10/2014  . Obesity (BMI 30-39.9) 12/26/2013  . GERD (gastroesophageal reflux disease) 11/17/2012  . Depression with anxiety 10/28/2010  . INSOMNIA 08/07/2010  . ALLERGIC RHINITIS 11/30/2008  . OSTEOPENIA 03/10/2008  . Hyperlipidemia 04/19/2007  . Left carotid bruit 04/19/2007  . Essential hypertension 04/07/2007  . Osteoarthritis 04/07/2007   Guadelupe Sabin, OTR/L  [phone] 08/31/2017, 2:46 PM  [place] [address] [place], [place], [address] Phone: [phone]   Fax:  [phone]  Name: [name] MRN: [id number] Date of Birth: [birth year]

## 2017-09-02 ENCOUNTER — Encounter (HOSPITAL_COMMUNITY): Payer: Self-pay

## 2017-09-02 ENCOUNTER — Ambulatory Visit (HOSPITAL_COMMUNITY): Payer: Medicare Other

## 2017-09-02 DIAGNOSIS — M5442 Lumbago with sciatica, left side: Secondary | ICD-10-CM

## 2017-09-02 DIAGNOSIS — R293 Abnormal posture: Secondary | ICD-10-CM

## 2017-09-02 DIAGNOSIS — M25512 Pain in left shoulder: Secondary | ICD-10-CM

## 2017-09-02 DIAGNOSIS — M25612 Stiffness of left shoulder, not elsewhere classified: Secondary | ICD-10-CM

## 2017-09-02 DIAGNOSIS — M25511 Pain in right shoulder: Secondary | ICD-10-CM | POA: Diagnosis not present

## 2017-09-02 DIAGNOSIS — G8929 Other chronic pain: Secondary | ICD-10-CM

## 2017-09-02 DIAGNOSIS — Z7409 Other reduced mobility: Secondary | ICD-10-CM

## 2017-09-02 DIAGNOSIS — R29898 Other symptoms and signs involving the musculoskeletal system: Secondary | ICD-10-CM | POA: Diagnosis not present

## 2017-09-02 DIAGNOSIS — R531 Weakness: Secondary | ICD-10-CM

## 2017-09-02 DIAGNOSIS — M25611 Stiffness of right shoulder, not elsewhere classified: Secondary | ICD-10-CM | POA: Diagnosis not present

## 2017-09-02 NOTE — Therapy (Signed)
[place], [place], [address] Phone: [phone]   Fax:  [phone]  Physical Therapy Treatment  Patient Details  Name: [name] MRN: [id number] Date of Birth: 23-Sep-1940 Referring Provider: Elsie Saas MD   Encounter Date: 09/02/2017  PT End of Session - 09/02/17 1447    Visit Number  4    Number of Visits  8    Date for PT Re-Evaluation  09/17/17    Authorization Type  [employer] Part A, BCBS Supplement    Authorization Time Period  08/20/2017-09/17/2016    Authorization - Visit Number  4    Authorization - Number of Visits  10    PT Start Time  6962    PT Stop Time  1523    PT Time Calculation (min)  49 min    Activity Tolerance  Patient tolerated treatment well;No increased pain    Behavior During Therapy  [facility] for tasks assessed/performed       Past Medical History:  Diagnosis Date  . ALLERGIC RHINITIS 11/30/2008  . ALLERGIC RHINITIS 11/30/2008  . ASTHMA UNSPECIFIED WITH EXACERBATION 02/17/2008  . CAROTID BRUIT, LEFT 04/19/2007  . Closed fracture of lateral malleolus 06/04/2009  . CLOSED FRACTURE OF METATARSAL BONE 06/04/2009  . Esophageal reflux 11/08/2008  . GANGLION CYST 04/19/2007  . HYPERLIPIDEMIA NEC/NOS 04/19/2007  . HYPERTENSION 04/07/2007  . INSOMNIA 08/07/2010  . OSTEOARTHRITIS 04/07/2007  . OSTEOPENIA 03/10/2008  . UTI 07/20/2009    Past Surgical History:  Procedure Laterality Date  . knuckle replacement    . TOTAL HIP ARTHROPLASTY    . TUBAL LIGATION      There were no vitals filed for this visit.  Subjective Assessment - 09/02/17 1433    Subjective  Pt stated she has increased Lt hip pain with weight bearing.  No reports of current pain but can increased to 10/10.    Patient Stated Goals  To improve hip pain and decrease falling    Currently in Pain?  No/denies Currently pain free, can increase to 10/10 burning sensations.                        [place]  Treatment/Exercise - 09/02/17 0001      Lumbar Exercises: Seated   Sit to Stand  5 reps eccentric control, no HHA normal chair height, cueing mechan      Knee/Hip Exercises: Standing   Heel Raises  15 reps;Limitations    Heel Raises Limitations  Toe raises 15x    Knee Flexion  --    Forward Step Up  Both;10 reps;Hand Hold: 2    Rocker Board  2 minutes R/L and A/P    SLS  Lt 10", Rt 4" max of 5    Other Standing Knee Exercises  tandem stance on foam 3x30" with intermittent HHA      Manual Therapy   Manual Therapy  Myofascial release    Manual therapy comments  manual therapy completed seperately from all other interventions    Soft tissue mobilization  Soft tissue mobilization to gluteal muscles on the left lower extremity               PT Short Term Goals - 08/20/17 1510      PT SHORT TERM GOAL #1   Title  Pt will be independent with HEP to improve overall pain management and prevent reoccurring injury.     Time  2  Period  Weeks    Status  New    Target Date  09/03/17      PT SHORT TERM GOAL #2   Title  Pt will demonstrate improved ROM without increased symptoms to improve functional mobility during ADLs.     Time  2    Period  Weeks    Status  New      PT SHORT TERM GOAL #3   Title  Pt will be able to maintain desired posture alignment to decrease strain on lumbar spine to improve quality of life.     Time  2    Period  Weeks    Status  New      PT SHORT TERM GOAL #4   Title  Pt will have increased hip strength by 1 MMT to improve stability and overall functional strength for ADLs.     Time  2    Period  Weeks    Status  New        PT Long Term Goals - 08/20/17 1512      PT LONG TERM GOAL #1   Title  Pt will have improved BERG balance score 53/56 to indicate decreased risk of falls.     Time  4    Period  Weeks    Status  New    Target Date  09/17/17      PT LONG TERM GOAL #2   Title  Pt will have decreased reports of falls to at most 1 over  two week period.    Time  4    Period  Weeks    Status  New      PT LONG TERM GOAL #3   Title  Pt will have improved hip strength by 2 MMT grades to improve stability during ambulation and functional ADLs.     Time  4    Period  Weeks    Status  New      PT LONG TERM GOAL #5   Title  Pt will demonstrate proper lifting mechanics to pick object off the floor at home to improve protection of lumbar spine during ADLs.    Time  4    Period  Weeks    Status  New            Plan - 09/02/17 1532    Clinical Impression Statement  Continued session focus with functional strengthening and balance activities.  Added rocker board and sidestep with resistance to improve weight distribution with gait and gluteal strengthening.  Pt continues to require HHA and/or min A with NBOS and dynamic surface with balance activities for safety.  Pt progressing well with ability to complete sit to stands from normal chair height, did require cueing for mechanics and equal weight bearing.  EOS with manual for pain control wiht noted spasms on lateral gluteal musculature wiht trigger points noted, reports of relief at EOS.      Rehab Potential  Good    PT Frequency  2x / week    PT Duration  4 weeks    PT Treatment/Interventions  ADLs/Self Care Home Management;Moist Heat;Balance training;Therapeutic exercise;Therapeutic activities;Functional mobility training;Stair training;Gait training;Neuromuscular re-education;Patient/family education    PT Next Visit Plan   continue focus on core strength, hip strength and dynamic balance;  Add lateral step-ups and trial with hip hikes if able to acheive proper mechanics.    PT Home Exercise Plan  eval: bridge, quad stretch, tandem and SL balance, hamstring st  Patient will benefit from skilled therapeutic intervention in order to improve the following deficits and impairments:  Abnormal gait, Pain, Improper body mechanics, Postural dysfunction, Decreased activity  tolerance, Decreased strength, Impaired flexibility, Difficulty walking, Decreased balance  Visit Diagnosis: Decreased strength  Acute left-sided low back pain with left-sided sciatica  Impaired functional mobility, balance, gait, and endurance  Posture abnormality     Problem List Patient Active Problem List   Diagnosis Date Noted  . Hypothyroidism, postop 11/19/2016  . Cyst in hand 08/23/2014  . Preventative health care 07/10/2014  . Attention deficit disorder 07/10/2014  . Obesity (BMI 30-39.9) 12/26/2013  . GERD (gastroesophageal reflux disease) 11/17/2012  . Depression with anxiety 10/28/2010  . INSOMNIA 08/07/2010  . ALLERGIC RHINITIS 11/30/2008  . OSTEOPENIA 03/10/2008  . Hyperlipidemia 04/19/2007  . Left carotid bruit 04/19/2007  . Essential hypertension 04/07/2007  . Osteoarthritis 04/07/2007   Ihor Austin, LPTA; [place]  Aldona Lento 09/02/2017, 3:40 PM  [place] [address] [place], [place], [address] Phone: [phone]   Fax:  [phone]  Name: [name] MRN: [id number] Date of Birth: [birth year]

## 2017-09-02 NOTE — Patient Instructions (Signed)
Complete 10-12 repetitions. Every other day.   Strengthening: Chest Pull - Resisted   Hold Theraband in front of body with hands about shoulder width a part. Pull band a part and back together slowly. Repeat ____ times. Complete ____ set(s) per session.. Repeat ____ session(s) per day.     Resisted External Rotation: in Neutral - Bilateral   Sit or stand, tubing in both hands, elbows at sides, bent to 90, forearms forward. Pinch shoulder blades together and rotate forearms out. Keep elbows at sides. Repeat ____ times per set. Do ____ sets per session. Do ____ sessions per day.

## 2017-09-02 NOTE — Therapy (Signed)
[place], [place], [address] Phone: [phone]   Fax:  [phone]  Occupational Therapy Treatment And reassessment Patient Details  Name: [name] MRN: [id number] Date of Birth: 09-Jul-1941 Referring Provider (Historical): Dr. Elsie Saas   Encounter Date: 09/02/2017  OT End of Session - 09/02/17 1718    Visit Number  12    Number of Visits  15    Authorization Type  1) Medicare A & B 2) BCBS    Authorization Time Period  Before 17th visit    Authorization - Visit Number  12    Authorization - Number of Visits  17    OT Start Time  2426 reassessment and discharge    OT Stop Time  1430    OT Time Calculation (min)  39 min    Activity Tolerance  Patient tolerated treatment well    Behavior During Therapy  [facility] for tasks assessed/performed       Past Medical History:  Diagnosis Date  . ALLERGIC RHINITIS 11/30/2008  . ALLERGIC RHINITIS 11/30/2008  . ASTHMA UNSPECIFIED WITH EXACERBATION 02/17/2008  . CAROTID BRUIT, LEFT 04/19/2007  . Closed fracture of lateral malleolus 06/04/2009  . CLOSED FRACTURE OF METATARSAL BONE 06/04/2009  . Esophageal reflux 11/08/2008  . GANGLION CYST 04/19/2007  . HYPERLIPIDEMIA NEC/NOS 04/19/2007  . HYPERTENSION 04/07/2007  . INSOMNIA 08/07/2010  . OSTEOARTHRITIS 04/07/2007  . OSTEOPENIA 03/10/2008  . UTI 07/20/2009    Past Surgical History:  Procedure Laterality Date  . knuckle replacement    . TOTAL HIP ARTHROPLASTY    . TUBAL LIGATION      There were no vitals filed for this visit.  Subjective Assessment - 09/02/17 1717    Subjective   S: My arms are doing great.    Currently in Pain?  No/denies in arms         [facility] OT Assessment - 09/02/17 1359      Assessment   Medical Diagnosis  bilateral shoulder RTC tendonitis and arthritis      Precautions   Precautions  Shoulder    Type of Shoulder Precautions  Progress as tolerated      AROM   Overall AROM Comments   Assessed seated, er/IR adducted    AROM Assessment Site  Shoulder    Right/Left Shoulder  Left;Right    Right Shoulder Flexion  170 Degrees previous: 160    Right Shoulder ABduction  175 Degrees previous: 167    Right Shoulder Internal Rotation  90 Degrees previous: same    Right Shoulder External Rotation  70 Degrees previous: 64    Left Shoulder Flexion  150 Degrees previous: 147    Left Shoulder ABduction  155 Degrees previous: previous    Left Shoulder Internal Rotation  90 Degrees previous: same    Left Shoulder External Rotation  70 Degrees previous: 59      PROM   Overall PROM Comments  Assessed supine, er/IR adducted    PROM Assessment Site  Shoulder    Right/Left Shoulder  Right;Left    Right Shoulder Flexion  180 Degrees previous: 160    Right Shoulder ABduction  180 Degrees previous: same    Right Shoulder Internal Rotation  90 Degrees previous: same    Right Shoulder External Rotation  82 Degrees previous: 82    Left Shoulder Flexion  155 Degrees previous: 149    Left Shoulder ABduction  180 Degrees previous: 151    Left  Shoulder Internal Rotation  90 Degrees previous: same    Left Shoulder External Rotation  45 Degrees previous: 47      Strength   Overall Strength Comments  Assessed seated, er/IR adducted    Strength Assessment Site  Shoulder    Right/Left Shoulder  Right;Left    Right Shoulder Flexion  5/5 previous: 4+/5    Right Shoulder ABduction  5/5 previous: 4+/5    Right Shoulder Internal Rotation  5/5 previous: 4/5    Right Shoulder External Rotation  5/5 previuos; 4/5                       OT Education - 09/02/17 1718    Education provided  Yes    Education Details  HEP updated for Shoulder strengthening with red and yellow band. Reviewed exercises, frequency, duration. etc.     Person(s) Educated  Patient    Methods  Explanation;Demonstration;Handout;Verbal cues    Comprehension  Verbalized understanding;Returned demonstration        OT Short Term Goals - 09/02/17 1419      OT SHORT TERM GOAL #1   Title  Pt will be provided with and educated on HEP for improved mobility in BUE.     Time  4    Period  Weeks    Status  Achieved      OT SHORT TERM GOAL #2   Title  Pt will decrease BUE fascial restrictions from mod/max to minimal amounts or less to improve mobility required for functional reaching.     Time  4    Period  Weeks    Status  Achieved      OT SHORT TERM GOAL #3   Title  Pt will decrease pain in BUE to 3/10 or less to improve completion of ADL tasks with minimal compensatory strategies.     Time  4    Period  Weeks    Status  Achieved      OT SHORT TERM GOAL #4   Title  Pt will improve BUE ROM to WFL to improve ability to reach behind back for seatbelts and to fasten bra.     Time  4    Period  Weeks    Status  Achieved      OT SHORT TERM GOAL #5   Title  Pt will improve BUE strength to 4+/5 to increase ability to complete yardwork and housework tasks.     Time  4    Period  Weeks    Status  Achieved               Plan - 09/02/17 1720    Clinical Impression Statement  A: Reassessment completed this date. pt has met all her therapy goals and reports that she is completing all daily tasks without difficulty with her BUEs. She reports that her hip is limiting her from doing her daily tasks which she is receiving PT services for. HEP was updated and patient is in agreement with discharge.     Plan  P: D/C with HEP.    Consulted and Agree with Plan of Care  Patient       Patient will benefit from skilled therapeutic intervention in order to improve the following deficits and impairments:  Impaired flexibility, Decreased strength, Decreased activity tolerance, Pain, Impaired UE functional use, Increased fascial restrictions, Decreased range of motion  Visit Diagnosis: Chronic right shoulder pain  Stiffness of left shoulder, not elsewhere classified    Other symptoms and signs involving  the musculoskeletal system  Stiffness of right shoulder, not elsewhere classified  Acute pain of left shoulder  G-Codes - 09-14-2017 1721    Functional Assessment Tool Used (Outpatient only)  FOTO score: 66/100 (34% impaired)    Functional Limitation  Carrying, moving and handling objects    Carrying, Moving and Handling Objects Goal Status (H0623)  At least 1 percent but less than 20 percent impaired, limited or restricted    Carrying, Moving and Handling Objects Discharge Status [phone])  At least 20 percent but less than 40 percent impaired, limited or restricted       Problem List Patient Active Problem List   Diagnosis Date Noted  . Hypothyroidism, postop 11/19/2016  . Cyst in hand 08/23/2014  . Preventative health care 07/10/2014  . Attention deficit disorder 07/10/2014  . Obesity (BMI 30-39.9) 12/26/2013  . GERD (gastroesophageal reflux disease) 11/17/2012  . Depression with anxiety 10/28/2010  . INSOMNIA 08/07/2010  . ALLERGIC RHINITIS 11/30/2008  . OSTEOPENIA 03/10/2008  . Hyperlipidemia 04/19/2007  . Left carotid bruit 04/19/2007  . Essential hypertension 04/07/2007  . Osteoarthritis 04/07/2007     OCCUPATIONAL THERAPY DISCHARGE SUMMARY  Visits from Start of Care: 12  Current functional level related to goals / functional outcomes: See above   Remaining deficits: See above   Education / Equipment: See above Plan: Patient agrees to discharge.  Patient goals were met. Patient is being discharged due to meeting the stated rehab goals.  ?????        Ailene Ravel, OTR/L,CBIS  [phone]  2017-09-14, 5:22 PM  [facility] [address] [place], [place], [address] Phone: [phone]   Fax:  [phone]  Name: LUCINDY BOREL MRN: [id number] Date of Birth: 01/23/1941

## 2017-09-03 ENCOUNTER — Telehealth (HOSPITAL_COMMUNITY): Payer: Self-pay | Admitting: *Deleted

## 2017-09-03 NOTE — Telephone Encounter (Signed)
returned phone call regarding scheduling an appointment.

## 2017-09-09 ENCOUNTER — Encounter: Payer: Self-pay | Admitting: Internal Medicine

## 2017-09-11 ENCOUNTER — Encounter: Payer: Self-pay | Admitting: Internal Medicine

## 2017-09-11 ENCOUNTER — Ambulatory Visit: Payer: Self-pay

## 2017-09-17 ENCOUNTER — Encounter (HOSPITAL_COMMUNITY): Payer: Self-pay

## 2017-09-21 ENCOUNTER — Telehealth (HOSPITAL_COMMUNITY): Payer: Self-pay | Admitting: Internal Medicine

## 2017-09-21 ENCOUNTER — Ambulatory Visit (HOSPITAL_COMMUNITY): Payer: Medicare Other

## 2017-09-21 NOTE — Telephone Encounter (Signed)
09/21/17  pt left a message to cx today but no reason was given

## 2017-09-22 ENCOUNTER — Telehealth: Payer: Self-pay

## 2017-09-22 ENCOUNTER — Ambulatory Visit: Payer: Self-pay

## 2017-09-22 NOTE — Telephone Encounter (Signed)
Pt is on schedule to see Dr. Volanda Napoleon tomorrow

## 2017-09-22 NOTE — Progress Notes (Deleted)
Subjective:   [name] is a 77 y.o. female who presents for Medicare Annual (Subsequent) preventive examination.  Diet    Exercise    Health Maintenance Due  Topic Date Due  . DEXA SCAN  01/02/2005   Educate regarding the shingrix     Objective:     Vitals: There were no vitals taken for this visit.  There is no height or weight on file to calculate BMI.  Advanced Directives 09/02/2017 08/27/2017 08/20/2017 07/08/2017 12/04/2014  Does Patient Have a Medical Advance Directive? Yes Yes Yes Yes Yes  Type of Paramedic of [name];Living will [name];Living will [name];Living will [name];Living will [name] in Chart? No - copy requested No - copy requested No - copy requested No - copy requested -    Tobacco Social History   Tobacco Use  Smoking Status Former Smoker  . Packs/day: 1.50  . Years: 20.00  . Pack years: 30.00  . Types: Cigarettes  . Last attempt to quit: 09/15/1968  . Years since quitting: 49.0  Smokeless Tobacco Never Used  Tobacco Comment   smoked 1.5 for  20 years     Counseling given: Not Answered Comment: smoked 1.5 for  20 years   Clinical Intake:                       Past Medical History:  Diagnosis Date  . ALLERGIC RHINITIS 11/30/2008  . ALLERGIC RHINITIS 11/30/2008  . ASTHMA UNSPECIFIED WITH EXACERBATION 02/17/2008  . CAROTID BRUIT, LEFT 04/19/2007  . Closed fracture of lateral malleolus 06/04/2009  . CLOSED FRACTURE OF METATARSAL BONE 06/04/2009  . Esophageal reflux 11/08/2008  . GANGLION CYST 04/19/2007  . HYPERLIPIDEMIA NEC/NOS 04/19/2007  . HYPERTENSION 04/07/2007  . INSOMNIA 08/07/2010  . OSTEOARTHRITIS 04/07/2007  . OSTEOPENIA 03/10/2008  . UTI 07/20/2009   Past Surgical History:  Procedure Laterality Date  . knuckle replacement    . TOTAL HIP ARTHROPLASTY    . TUBAL  LIGATION     Family History  Problem Relation Age of Onset  . Heart disease Mother        Mother also has significant carotid artery stenosis  . COPD Father   . Alcoholism Father   . Cancer Brother   . Alzheimer's disease Sister   . Thyroid disease Sister        Goiter  . Breast cancer Neg Hx    Social History   Socioeconomic History  . Marital status: Married    Spouse name: Not on file  . Number of children: Not on file  . Years of education: Not on file  . Highest education level: Not on file  Social Needs  . Financial resource strain: Not on file  . Food insecurity - worry: Not on file  . Food insecurity - inability: Not on file  . Transportation needs - medical: Not on file  . Transportation needs - non-medical: Not on file  Occupational History  . Occupation: retired    Fish farm manager: RETIRED  Tobacco Use  . Smoking status: Former Smoker    Packs/day: 1.50    Years: 20.00    Pack years: 30.00    Types: Cigarettes    Last attempt to quit: 09/15/1968    Years since quitting: 49.0  . Smokeless tobacco: Never Used  . Tobacco comment: smoked 1.5 for  20 years  Substance and Sexual Activity  . Alcohol use: No  . Drug use: No  . Sexual activity: Yes  Other Topics Concern  . Not on file  Social History Narrative   Married for 50 years. She has 1 son that age 17   Retired from Environmental [employer]          Outpatient Encounter Medications as of 09/22/2017  Medication Sig  . amphetamine-dextroamphetamine (ADDERALL) 30 MG tablet TAKE 30 MG BY MOUTH IN AM AND 30 MG IN PM  . amphetamine-dextroamphetamine (ADDERALL) 30 MG tablet Take 30 mg by mouth in the Am and 30 mg in pm  . amphetamine-dextroamphetamine (ADDERALL) 30 MG tablet Take 1 tablet by mouth 2 (two) times daily. Fill in 2 months  . aspirin-acetaminophen-caffeine (EXCEDRIN MIGRAINE) 250-250-65 MG per tablet Take 1 tablet by mouth every 6 (six) hours as needed. (Patient taking differently: Take 1 tablet by  mouth daily as needed for headache or migraine. )  . buPROPion (WELLBUTRIN XL) 300 MG 24 hr tablet TAKE ONE TABLET BY MOUTH ONCE DAILY  . calcium carbonate (TUMS - DOSED IN MG ELEMENTAL CALCIUM) 500 MG chewable tablet Chew 1-2 tablets by mouth 2 (two) times daily.   Marland Kitchen conjugated estrogens (PREMARIN) vaginal cream Place 1 Applicatorful vaginally daily. Apply twice per week  . Cyanocobalamin (B-12 PO) Take 1 tablet by mouth daily. Reported on 01/21/2016  . diclofenac sodium (VOLTAREN) 1 % GEL APPLY   TOPICALLY TO AFFECTED AREA 4 TIMES DAILY AS NEEDED FOR PAIN  . FLUoxetine (PROZAC) 10 MG capsule TAKE 3 CAPSULES(30 MG) BY MOUTH DAILY  . glucosamine-chondroitin 500-400 MG tablet Take 1 tablet by mouth 3 (three) times daily.  Marland Kitchen levothyroxine (SYNTHROID, LEVOTHROID) 112 MCG tablet TAKE 1 TABLET BY MOUTH ONCE DAILY BEFORE BREAKFAST  . liothyronine (CYTOMEL) 5 MCG tablet TAKE 1 TABLET BY MOUTH ONCE DAILY  . losartan-hydrochlorothiazide (HYZAAR) 100-25 MG tablet Take 1 tablet by mouth daily.  . Melatonin 3 MG TABS Take 3 mg by mouth at bedtime. Reported on 01/21/2016  . Multiple Vitamin (MULTIVITAMIN) capsule Take 1 capsule by mouth daily.    . ranitidine (ZANTAC) 150 MG tablet TAKE ONE TABLET BY MOUTH TWICE DAILY AS NEEDED FOR  HEARTBURN   No facility-administered encounter medications on file as of 09/22/2017.     Activities of Daily Living No flowsheet data found.  Patient Care Team: Marletta Lor, MD as PCP - General (Internal Medicine)    Assessment:   This is a routine wellness examination for [name].  Exercise Activities and Dietary recommendations    Goals    . Decrease soda or juice intake    . Exercise 150 minutes per week (moderate activity)    . Increase water intake       Fall Risk Fall Risk  11/19/2016 05/23/2016 04/02/2016 03/09/2015 11/17/2012  Falls in the past year? Yes No Yes No No  Number falls in past yr: 1 - 1 - -  Injury with Fall? Yes - Yes - -  Comment - - Twisted  ankle - -  Follow up Education provided - - - -   Is the patient's home free of loose throw rugs in walkways, pet beds, electrical cords, etc?   {Blank single:19197::"yes","no"}      Grab bars in the bathroom? {Blank single:19197::"yes","no"}      Handrails on the stairs?   {Blank single:19197::"yes","no"}      Adequate lighting?   {Blank single:19197::"yes","no"}  Timed Get Up and Go performed: ***  Depression Screen PHQ 2/9 Scores 05/23/2016 04/02/2016 03/09/2015 11/17/2012  PHQ - 2 Score 1 0 6 2  PHQ- 9 Score - - 21 11     Cognitive Function        Immunization History  Administered Date(s) Administered  . Influenza Split 07/17/2011, 06/30/2012  . Influenza Whole 07/20/2007, 06/09/2008, 07/20/2009, 08/22/2010  . Influenza, High Dose Seasonal PF 07/25/2013, 07/17/2015, 08/01/2016, 07/02/2017  . Influenza,inj,Quad PF,6+ Mos 07/10/2014  . Pneumococcal Conjugate-13 07/10/2014  . Pneumococcal Polysaccharide-23 09/16/2003, 10/16/2011  . Td 09/15/2005  . Tdap 12/04/2014  . Zoster 05/21/2016    Qualifies for Shingles Vaccine?***  Screening Tests Health Maintenance  Topic Date Due  . DEXA SCAN  01/02/2005  . TETANUS/TDAP  12/03/2024  . INFLUENZA VACCINE  Completed  . PNA vac Low Risk Adult  Completed    Cancer Screenings: Lung: Low Dose CT Chest recommended if Age 47-80 years, 30 pack-year currently smoking OR have quit w/in 15years. Patient {DOES NOT does:27190::"does not"} qualify. Breast:  Up to date on Mammogram? {Yes/No:30480221}   Up to date of Bone Density/Dexa? {Yes/No:30480221} Colorectal: ***  Additional Screenings: *** Hepatitis B/HIV/Syphillis: Hepatitis C Screening:      Plan:   ***   I have personally reviewed and noted the following in the patient's chart:   . Medical and social history . Use of alcohol, tobacco or illicit drugs  . Current medications and supplements . Functional ability and status . Nutritional status . Physical  activity . Advanced directives . List of other physicians . Hospitalizations, surgeries, and ER visits in previous 12 months . Vitals . Screenings to include cognitive, depression, and falls . Referrals and appointments  In addition, I have reviewed and discussed with patient certain preventive protocols, quality metrics, and best practice recommendations. A written personalized care plan for preventive services as well as general preventive health recommendations were provided to patient.     Wynetta Fines, RN  09/22/2017

## 2017-09-22 NOTE — Telephone Encounter (Signed)
Call to fup on AWV. The patient c/o of URI since thanksgiving. States she has a lot of nasal drainage and nothing otc has helped. Is not taking nasal steriodal nasal spray and states she does not have allergies.  Also wanted her Ur checked as there is not pain but an odor.  Agreed to schedule apt with Dr. Raliegh Ip or other tomorrow.  No fever; no coughing, no h/a, no nasal congestion, but c/o of runny nose.   Please call and schedule apt for fup acute She will call and reschedule her AWV on my private line when she is feeling better   Tks,

## 2017-09-23 ENCOUNTER — Ambulatory Visit (HOSPITAL_COMMUNITY): Payer: Medicare Other

## 2017-09-23 ENCOUNTER — Ambulatory Visit (INDEPENDENT_AMBULATORY_CARE_PROVIDER_SITE_OTHER): Payer: Medicare Other | Admitting: Family Medicine

## 2017-09-23 ENCOUNTER — Encounter: Payer: Self-pay | Admitting: Family Medicine

## 2017-09-23 VITALS — BP 120/70 | HR 81 | Temp 98.3°F | Wt 183.6 lb

## 2017-09-23 DIAGNOSIS — R0982 Postnasal drip: Secondary | ICD-10-CM

## 2017-09-23 DIAGNOSIS — R319 Hematuria, unspecified: Secondary | ICD-10-CM

## 2017-09-23 LAB — POC URINALSYSI DIPSTICK (AUTOMATED)
Bilirubin, UA: NEGATIVE
Glucose, UA: NEGATIVE
KETONES UA: NEGATIVE
Leukocytes, UA: NEGATIVE
Nitrite, UA: NEGATIVE
PROTEIN UA: NEGATIVE
SPEC GRAV UA: 1.015 (ref 1.010–1.025)
UROBILINOGEN UA: 0.2 U/dL
pH, UA: 6 (ref 5.0–8.0)

## 2017-09-23 MED ORDER — FLUTICASONE PROPIONATE 50 MCG/ACT NA SUSP: 1.0000 | Freq: Every day | NASAL | 0 refills | Status: DC

## 2017-09-23 NOTE — Patient Instructions (Addendum)
Hematuria, Adult Hematuria is blood in your urine. It can be caused by a bladder infection, kidney infection, prostate infection, kidney stone, or cancer of your urinary tract. Infections can usually be treated with medicine, and a kidney stone usually will pass through your urine. If neither of these is the cause of your hematuria, further workup to find out the reason may be needed. It is very important that you tell your health care provider about any blood you see in your urine, even if the blood stops without treatment or happens without causing pain. Blood in your urine that happens and then stops and then happens again can be a symptom of a very serious condition. Also, pain is not a symptom in the initial stages of many urinary cancers. Follow these instructions at home:  Drink lots of fluid, 3-4 quarts a day. If you have been diagnosed with an infection, cranberry juice is especially recommended, in addition to large amounts of water.  Avoid caffeine, tea, and carbonated beverages because they tend to irritate the bladder.  Avoid alcohol because it may irritate the prostate.  Take all medicines as directed by your health care provider.  If you were prescribed an antibiotic medicine, finish it all even if you start to feel better.  If you have been diagnosed with a kidney stone, follow your health care provider's instructions regarding straining your urine to catch the stone.  Empty your bladder often. Avoid holding urine for long periods of time.  After a bowel movement, women should cleanse front to back. Use each tissue only once.  Empty your bladder before and after sexual intercourse if you are a female. Contact a health care provider if:  You develop back pain.  You have a fever.  You have a feeling of sickness in your stomach (nausea) or vomiting.  Your symptoms are not better in 3 days. Return sooner if you are getting worse. Get help right away if:  You develop  severe vomiting and are unable to keep the medicine down.  You develop severe back or abdominal pain despite taking your medicines.  You begin passing a large amount of blood or clots in your urine.  You feel extremely weak or faint, or you pass out. This information is not intended to replace advice given to you by your health care provider. Make sure you discuss any questions you have with your health care provider. Document Released: 09/01/2005 Document Revised: 02/07/2016 Document Reviewed: 05/02/2013 Elsevier Interactive Patient Education  2017 [place] Drip Postnasal drip is the feeling of mucus going down the back of your throat. Mucus is a slimy substance that moistens and cleans your nose and throat, as well as the air pockets in face bones near your forehead and cheeks (sinuses). Small amounts of mucus pass from your nose and sinuses down the back of your throat all the time. This is normal. When you produce too much mucus or the mucus gets too thick, you can feel it. Some common causes of postnasal drip include:  Having more mucus because of: ? A cold or the flu. ? Allergies. ? Cold air. ? Certain medicines.  Having more mucus that is thicker because of: ? A sinus or nasal infection. ? Dry air. ? A food allergy.  Follow these instructions at home: Relieving discomfort  Gargle with a salt-water mixture 3-4 times a day or as needed. To make a salt-water mixture, completely dissolve -1 tsp of salt in 1 cup of  warm water.  If the air in your home is dry, use a humidifier to add moisture to the air.  Use a saline spray or container (neti pot) to flush out the nose (nasal irrigation). These methods can help clear away mucus and keep the nasal passages moist. General instructions  Take over-the-counter and prescription medicines only as told by your health care provider.  Follow instructions from your health care provider about eating or drinking  restrictions. You may need to avoid caffeine.  Avoid things that you know you are allergic to (allergens), like dust, mold, pollen, pets, or certain foods.  Drink enough fluid to keep your urine pale yellow.  Keep all follow-up visits as told by your health care provider. This is important. Contact a health care provider if:  You have a fever.  You have a sore throat.  You have difficulty swallowing.  You have headache.  You have sinus pain.  You have a cough that does not go away.  The mucus from your nose becomes thick and is green or yellow in color.  You have cold or flu symptoms that last more than 10 days. Summary  Postnasal drip is the feeling of mucus going down the back of your throat.  If your health care provider approves, use nasal irrigation or a nasal spray 2?4 times a day.  Avoid things that you know you are allergic to (allergens), like dust, mold, pollen, pets, or certain foods. This information is not intended to replace advice given to you by your health care provider. Make sure you discuss any questions you have with your health care provider. Document Released: 12/15/2016 Document Revised: 12/15/2016 Document Reviewed: 12/15/2016 Elsevier Interactive Patient Education  [employer].

## 2017-09-23 NOTE — Progress Notes (Signed)
Subjective:    Patient ID: [name], female    DOB: 09-02-1941, 77 y.o.   MRN: [id number]  No chief complaint on file.   HPI Patient was seen today for acute concerns.  Dx'd with bronchitis after Thanksgiving, has had rhinorrhea ever since.  Denies facial pressure, fever, sore throat.  Endorses cough when laying down at night, but uses a Hall's cough drop for relief.  Also endorses increased odor to urine, but denies dysuria  Past Medical History:  Diagnosis Date  . ALLERGIC RHINITIS 11/30/2008  . ALLERGIC RHINITIS 11/30/2008  . ASTHMA UNSPECIFIED WITH EXACERBATION 02/17/2008  . CAROTID BRUIT, LEFT 04/19/2007  . Closed fracture of lateral malleolus 06/04/2009  . CLOSED FRACTURE OF METATARSAL BONE 06/04/2009  . Esophageal reflux 11/08/2008  . GANGLION CYST 04/19/2007  . HYPERLIPIDEMIA NEC/NOS 04/19/2007  . HYPERTENSION 04/07/2007  . INSOMNIA 08/07/2010  . OSTEOARTHRITIS 04/07/2007  . OSTEOPENIA 03/10/2008  . UTI 07/20/2009    Allergies  Allergen Reactions  . Cephalexin Other (See Comments)    REACTION IS UNKNOWN    ROS General: Denies fever, chills, night sweats, changes in weight, changes in appetite HEENT: Denies headaches, ear pain, changes in vision, sore throat   +rhinorrhea.  CV: Denies CP, palpitations, SOB, orthopnea Pulm: Denies SOB, cough, wheezing  +recent dx of bronchitis GI: Denies abdominal pain, nausea, vomiting, diarrhea, constipation GU: Denies dysuria, hematuria, frequency, vaginal discharge  +increased urinary odor Msk: Denies muscle cramps, joint pains Neuro: Denies weakness, numbness, tingling Skin: Denies rashes, bruising Psych: Denies depression, anxiety, hallucinations     Objective:    Blood pressure 120/70, pulse 81, temperature 98.3 F (36.8 C), temperature source Oral, weight 183 lb 9.6 oz (83.3 kg), SpO2 94 %.   Gen. Pleasant, well-nourished, in no distress, normal affect   HEENT: [place]/AT, face symmetric, conjunctiva clear, no scleral icterus,  PERRLA, nares patent with clear drainage, pharynx without erythema or exudate. TMs full b/l.  Clear nasal drainage dripping from pt's nose when she bends forward Lungs: no accessory muscle use, CTAB, no wheezes or rales Cardiovascular: RRR, no m/r/g, no peripheral edema Neuro:  A&Ox3, CN II-XII intact, normal gait    Wt Readings from Last 3 Encounters:  08/19/17 187 lb 3.2 oz (84.9 kg)  07/02/17 183 lb (83 kg)  05/08/17 183 lb 3.2 oz (83.1 kg)    Lab Results  Component Value Date   WBC 7.8 05/23/2016   HGB 14.2 05/23/2016   HCT 42.7 05/23/2016   PLT 343.0 05/23/2016   GLUCOSE 91 05/23/2016   CHOL 197 05/23/2016   TRIG 62.0 05/23/2016   HDL 76.10 05/23/2016   LDLDIRECT 114.2 12/26/2013   LDLCALC 108 (H) 05/23/2016   ALT 38 (H) 05/23/2016   AST 32 05/23/2016   NA 137 05/23/2016   K 3.5 05/23/2016   CL 99 05/23/2016   CREATININE 0.91 05/23/2016   BUN 19 05/23/2016   CO2 34 (H) 05/23/2016   TSH 1.59 06/26/2017    Assessment/Plan: Hematuria, unspecified type  -Discussed various causes of hematuria including renal calculi, infection, etc. -will send Urine for Cx and notify pt with results.  If needed will send in abx to pharmacy -pt to increase po intake of fluids - Plan: POCT Urinalysis Dipstick (Automated), Urine Culture  Post-nasal drainage  -Plan: fluticasone (FLONASE) 50 MCG/ACT nasal spray -if no improvement with flonase pt to RTC for further eval such as CT of sinuses.   F/u prn   Grier Mitts, MD

## 2017-09-24 ENCOUNTER — Ambulatory Visit (HOSPITAL_COMMUNITY): Payer: Medicare Other | Attending: Orthopedic Surgery

## 2017-09-24 ENCOUNTER — Encounter (HOSPITAL_COMMUNITY): Payer: Self-pay

## 2017-09-24 DIAGNOSIS — Z7409 Other reduced mobility: Secondary | ICD-10-CM | POA: Diagnosis not present

## 2017-09-24 DIAGNOSIS — R293 Abnormal posture: Secondary | ICD-10-CM | POA: Diagnosis not present

## 2017-09-24 DIAGNOSIS — M5442 Lumbago with sciatica, left side: Secondary | ICD-10-CM

## 2017-09-24 DIAGNOSIS — R531 Weakness: Secondary | ICD-10-CM

## 2017-09-24 LAB — URINE CULTURE
MICRO NUMBER: 90034845
SPECIMEN QUALITY: ADEQUATE

## 2017-09-24 NOTE — Therapy (Addendum)
[facility], [place], [address] Phone: [phone]   Fax:  [phone]  Physical Therapy Treatment/Reassessment  Patient Details  Name: [name] MRN: [id number] Date of Birth: 1941-09-06 Referring Provider: Elsie Saas MD   Encounter Date: 09/24/2017  PT End of Session - 09/24/17 1346    Visit Number  5    Number of Visits  16    Date for PT Re-Evaluation  10/22/17    Authorization Type  [employer] Part A, BCBS Supplement    Authorization Time Period  08/20/2017-09/17/2016; NEW: 09/24/17 to 10/22/17    Authorization - Visit Number  --    Authorization - Number of Visits  --    PT Start Time  7903    PT Stop Time  1425    PT Time Calculation (min)  40 min    Activity Tolerance  Patient tolerated treatment well;No increased pain    Behavior During Therapy  WFL for tasks assessed/performed       Past Medical History:  Diagnosis Date  . ALLERGIC RHINITIS 11/30/2008  . ALLERGIC RHINITIS 11/30/2008  . ASTHMA UNSPECIFIED WITH EXACERBATION 02/17/2008  . CAROTID BRUIT, LEFT 04/19/2007  . Closed fracture of lateral malleolus 06/04/2009  . CLOSED FRACTURE OF METATARSAL BONE 06/04/2009  . Esophageal reflux 11/08/2008  . GANGLION CYST 04/19/2007  . HYPERLIPIDEMIA NEC/NOS 04/19/2007  . HYPERTENSION 04/07/2007  . INSOMNIA 08/07/2010  . OSTEOARTHRITIS 04/07/2007  . OSTEOPENIA 03/10/2008  . UTI 07/20/2009    Past Surgical History:  Procedure Laterality Date  . knuckle replacement    . TOTAL HIP ARTHROPLASTY    . TUBAL LIGATION      There were no vitals filed for this visit.  Subjective Assessment - 09/24/17 1348    Subjective  Pt stated her pain has moved to her left hip and gradually has gone down to her ankle. If steps wrong gets a sharp shooting pain in left thigh. Denies numbness and tingling. Sitting pain 0/10; 2/10 now. 10/10 when standing. Reports stiff the the mornings and getting into and out of bed. MD appt this  coming Wednesday.    Patient Stated Goals  To improve hip pain and decrease falling    Currently in Pain?  No/denies         [facility] PT Assessment - 09/24/17 0001      Functional Tests   Functional tests  Other      Other:   Other/ Comments  Lifting Floor to Chest: 10 lbs      AROM   Overall AROM Comments  LT hip IR/ER WFLs    Lumbar Extension  WFL non painful    Lumbar - Right Side Bend   WFL left thigh pain on 1st attempt    Lumbar - Left Side Bend  WFL non painful    Lumbar - Right Rotation  50% non painful    Lumbar - Left Rotation  50% non painful      Strength   Right/Left Hip  Left    Right Hip Extension  4/5    Right Hip ABduction  4/5    Left Hip Flexion  4+/5    Left Hip Extension  4/5    Left Hip ABduction  4+/5      Flexibility   Quadriceps  +Eli's on LT      Palpation   Palpation comment  TTP over LT piriformis; palpable trigger point; TTP over LT proximal glut max  Berg Balance Test   Sit to Stand  Able to stand without using hands and stabilize independently    Standing Unsupported  Able to stand safely 2 minutes    Sitting with Back Unsupported but Feet Supported on Floor or Stool  Able to sit safely and securely 2 minutes    Stand to Sit  Sits safely with minimal use of hands    Transfers  Able to transfer safely, minor use of hands    Standing Unsupported with Eyes Closed  Able to stand 10 seconds safely    Standing Ubsupported with Feet Together  Able to place feet together independently and stand 1 minute safely    From Standing, Reach Forward with Outstretched Arm  Can reach forward >12 cm safely (5")    From Standing Position, Pick up Object from Floor  Able to pick up shoe safely and easily    From Standing Position, Turn to Look Behind Over each Shoulder  Looks behind from both sides and weight shifts well    Turn 360 Degrees  Able to turn 360 degrees safely in 4 seconds or less    Standing Unsupported, Alternately Place Feet on Step/Stool   Able to stand independently and safely and complete 8 steps in 20 seconds    Standing Unsupported, One Foot in Front  Able to plae foot ahead of the other independently and hold 30 seconds    Standing on One Leg  Able to lift leg independently and hold 5-10 seconds    Total Score  53           PT Education - 09/24/17 1346    Education provided  Yes    Education Details  reassessment findings    Person(s) Educated  Patient    Methods  Explanation;Demonstration    Comprehension  Verbalized understanding;Returned demonstration       PT Short Term Goals - 09/24/17 1347      PT SHORT TERM GOAL #1   Title  Pt will be independent with HEP to improve overall pain management and prevent reoccurring injury.     Baseline  Hurts too much to do them.    Time  2    Period  Weeks    Status  On-going    Target Date  10/08/17      PT SHORT TERM GOAL #2   Title  Pt will demonstrate improved ROM without increased symptoms to improve functional mobility during ADLs.     Time  2    Period  Weeks    Status  Achieved      PT SHORT TERM GOAL #3   Title  Pt will be able to maintain desired posture alignment to decrease strain on lumbar spine to improve quality of life.     Time  2    Period  Weeks    Status  On-going      PT SHORT TERM GOAL #4   Title  Pt will have increased hip strength by 1 MMT to improve stability and overall functional strength for ADLs.     Time  2    Period  Weeks    Status  On-going        PT Long Term Goals - 09/24/17 1347      PT LONG TERM GOAL #1   Title  Pt will have improved BERG balance score 53/56 to indicate decreased risk of falls.     Time  4    Period  Weeks    Status  Achieved    Target Date  10/22/17      PT LONG TERM GOAL #2   Title  Pt will have decreased reports of falls to at most 1 over two week period.    Baseline  No falls since she was here last.     Time  4    Period  Weeks    Status  Partially Met      PT LONG TERM GOAL #3    Title  Pt will have improved hip strength by 2 MMT grades to improve stability during ambulation and functional ADLs.     Time  4    Period  Weeks    Status  On-going      PT LONG TERM GOAL #5   Title  Pt will demonstrate proper lifting mechanics to pick object off the floor at home to improve protection of lumbar spine during ADLs.    Time  4    Period  Weeks    Status  On-going            Plan - 09/24/17 1432    Clinical Impression Statement  Pt exhibits improvements in lumbar AROM, lower extremity strength, and incidence of falls. Pt does continue to have incidence of falls, continues to have decreased strength, increased radicular pain into the left lower extremity and somewhat decreased balance especially during higher level narrow base of suport and SLS balance activities. Pt noted to have +Ely's on LLE with recreation of pain during test, indicating tight quads/hip flexors potentially contributing to pt's complaints. Her hip ROM grossly WFL and non-painful, but she did have increased soft tissue restrictions of hip musculature and quads with tenderness to palpation throughout. Patient would continue to benefit from skilled physical therapy to address the beforementioned deficits.     History and Personal Factors relevant to plan of care:  Left hip replacement, fall risk with 3 close together 1 month ago.    Clinical Decision Making  Low    PT Frequency  2x / week    PT Duration  4 weeks    PT Treatment/Interventions  ADLs/Self Care Home Management;Moist Heat;Balance training;Therapeutic exercise;Therapeutic activities;Functional mobility training;Stair training;Gait training;Neuromuscular re-education;Patient/family education;Manual techniques    PT Next Visit Plan   continue focus on core strength, hip strength and dynamic balance;  Add lateral step-ups and trial with hip hikes if able to acheive proper mechanics; further investigate etiology of LLE radicular symptoms.; review  existing HEP.    PT Home Exercise Plan  eval: bridge, quad stretch, tandem and SL balance, hamstring st    Consulted and Agree with Plan of Care  Patient       Patient will benefit from skilled therapeutic intervention in order to improve the following deficits and impairments:  Abnormal gait, Decreased knowledge of precautions, Decreased strength, Pain, Decreased balance, Difficulty walking, Increased muscle spasms, Postural dysfunction  Visit Diagnosis: Acute left-sided low back pain with left-sided sciatica - Plan: PT plan of care cert/re-cert  Decreased strength - Plan: PT plan of care cert/re-cert  Impaired functional mobility, balance, gait, and endurance - Plan: PT plan of care cert/re-cert  Posture abnormality - Plan: PT plan of care cert/re-cert     Problem List Patient Active Problem List   Diagnosis Date Noted  . Hypothyroidism, postop 11/19/2016  . Cyst in hand 08/23/2014  . Preventative health care 07/10/2014  . Attention deficit disorder 07/10/2014  . Obesity (BMI 30-39.9) 12/26/2013  .  GERD (gastroesophageal reflux disease) 11/17/2012  . Depression with anxiety 10/28/2010  . INSOMNIA 08/07/2010  . ALLERGIC RHINITIS 11/30/2008  . OSTEOPENIA 03/10/2008  . Hyperlipidemia 04/19/2007  . Left carotid bruit 04/19/2007  . Essential hypertension 04/07/2007  . Osteoarthritis 04/07/2007       Geraldine Solar PT, DPT  [facility] [address] [place], [place], [address] Phone: [phone]   Fax:  [phone]  Name: ZYAH GOMM MRN: [id number] Date of Birth: June 18, 1941

## 2017-09-28 ENCOUNTER — Encounter (HOSPITAL_COMMUNITY): Payer: Self-pay

## 2017-09-28 ENCOUNTER — Ambulatory Visit (HOSPITAL_COMMUNITY): Payer: Medicare Other

## 2017-09-28 DIAGNOSIS — R531 Weakness: Secondary | ICD-10-CM | POA: Diagnosis not present

## 2017-09-28 DIAGNOSIS — M5442 Lumbago with sciatica, left side: Secondary | ICD-10-CM | POA: Diagnosis not present

## 2017-09-28 DIAGNOSIS — Z7409 Other reduced mobility: Secondary | ICD-10-CM | POA: Diagnosis not present

## 2017-09-28 DIAGNOSIS — R293 Abnormal posture: Secondary | ICD-10-CM | POA: Diagnosis not present

## 2017-09-28 NOTE — Therapy (Signed)
[name], [place], [address] Phone: [phone]   Fax:  [phone]  Physical Therapy Treatment  Patient Details  Name: [name] MRN: [id number] Date of Birth: Jun 24, 1941 Referring Provider: Elsie Saas MD   Encounter Date: 09/28/2017  PT End of Session - 09/28/17 1306    Visit Number  6    Number of Visits  16    Date for PT Re-Evaluation  10/22/17    Authorization Type  [employer] Part A, BCBS Supplement    Authorization Time Period  08/20/2017-09/17/2016; NEW: 09/24/17 to 10/22/17    PT Start Time  1304    PT Stop Time  1343    PT Time Calculation (min)  39 min    Activity Tolerance  Patient tolerated treatment well;No increased pain    Behavior During Therapy  WFL for tasks assessed/performed       Past Medical History:  Diagnosis Date  . ALLERGIC RHINITIS 11/30/2008  . ALLERGIC RHINITIS 11/30/2008  . ASTHMA UNSPECIFIED WITH EXACERBATION 02/17/2008  . CAROTID BRUIT, LEFT 04/19/2007  . Closed fracture of lateral malleolus 06/04/2009  . CLOSED FRACTURE OF METATARSAL BONE 06/04/2009  . Esophageal reflux 11/08/2008  . GANGLION CYST 04/19/2007  . HYPERLIPIDEMIA NEC/NOS 04/19/2007  . HYPERTENSION 04/07/2007  . INSOMNIA 08/07/2010  . OSTEOARTHRITIS 04/07/2007  . OSTEOPENIA 03/10/2008  . UTI 07/20/2009    Past Surgical History:  Procedure Laterality Date  . knuckle replacement    . TOTAL HIP ARTHROPLASTY    . TUBAL LIGATION      There were no vitals filed for this visit.  Subjective Assessment - 09/28/17 1306    Subjective  Pt states that she feels fine today. She's having a little bit of pain in her L hip near her incision is.     Patient Stated Goals  To improve hip pain and decrease falling    Currently in Pain?  Yes    Pain Score  1     Pain Location  Hip    Pain Orientation  Left    Pain Descriptors / Indicators  Burning    Pain Type  Chronic pain    Pain Onset  1 to 4 weeks ago    Pain Frequency   Constant    Aggravating Factors   initial standing up from chair, vacuum, housework    Pain Relieving Factors  nothing for hip, educated on trial of heat    Effect of Pain on Daily Activities  mod effect on ADL completion           [facility] Adult PT Treatment/Exercise - 09/28/17 0001      Lumbar Exercises: Stretches   Active Hamstring Stretch  Left;1 rep;30 seconds    Quad Stretch  Left;3 reps;30 seconds      Lumbar Exercises: Supine   Bridge  15 reps    Bridge Limitations  2 sets      Knee/Hip Exercises: Sidelying   Clams  iso clams with belt 15x6" holds each       Manual Therapy   Manual Therapy  Myofascial release    Manual therapy comments  manual therapy completed seperately from all other interventions    Myofascial Release  MFR to L glute med, max, piriformis, TFL            PT Short Term Goals - 09/24/17 1347      PT SHORT TERM GOAL #1   Title  Pt will be  independent with HEP to improve overall pain management and prevent reoccurring injury.     Baseline  Hurts too much to do them.    Time  2    Period  Weeks    Status  On-going    Target Date  10/08/17      PT SHORT TERM GOAL #2   Title  Pt will demonstrate improved ROM without increased symptoms to improve functional mobility during ADLs.     Time  2    Period  Weeks    Status  Achieved      PT SHORT TERM GOAL #3   Title  Pt will be able to maintain desired posture alignment to decrease strain on lumbar spine to improve quality of life.     Time  2    Period  Weeks    Status  On-going      PT SHORT TERM GOAL #4   Title  Pt will have increased hip strength by 1 MMT to improve stability and overall functional strength for ADLs.     Time  2    Period  Weeks    Status  On-going        PT Long Term Goals - 09/24/17 1347      PT LONG TERM GOAL #1   Title  Pt will have improved BERG balance score 53/56 to indicate decreased risk of falls.     Time  4    Period  Weeks    Status  Achieved     Target Date  10/22/17      PT LONG TERM GOAL #2   Title  Pt will have decreased reports of falls to at most 1 over two week period.    Baseline  No falls since she was here last.     Time  4    Period  Weeks    Status  Partially Met      PT LONG TERM GOAL #3   Title  Pt will have improved hip strength by 2 MMT grades to improve stability during ambulation and functional ADLs.     Time  4    Period  Weeks    Status  On-going      PT LONG TERM GOAL #5   Title  Pt will demonstrate proper lifting mechanics to pick object off the floor at home to improve protection of lumbar spine during ADLs.    Time  4    Period  Weeks    Status  On-going            Plan - 09/28/17 1344    Clinical Impression Statement  Began session by reviewing pt's HEP; she stated that the bridging is the only exercise she had been performing and that it was the one that would cause her pain, but, it was also the one that helped her the most. Most of session focused on MFR to pt's L hip, specifically her L glute med/max, TFL, and piriformis to address soft tissue restrictions. Pt with palpable increased soft tissue restrictions and tenderness to palpation. However, she reported that with prolonged MFR, her pain was subsiding. Followed MFR with isometric clams for continued pain relief and bridging. Spent a good deal of time educating pt on THR's and likely contributing factors to her pain with good understanding. Continue to address soft tissue restrictions and continue with gluteal strengthening.     PT Frequency  2x / week    PT Duration  4 weeks    PT Treatment/Interventions  ADLs/Self Care Home Management;Moist Heat;Balance training;Therapeutic exercise;Therapeutic activities;Functional mobility training;Stair training;Gait training;Neuromuscular re-education;Patient/family education;Manual techniques    PT Next Visit Plan  MFR L gluteal musculature; gluteal strengthening, continue iso clams and add to HEP;  continue focus on core strength, hip strength and dynamic balance;  Add lateral step-ups and trial with hip hikes if able to acheive proper mechanics;    PT Home Exercise Plan  eval: bridge, quad stretch, tandem and SL balance, hamstring st    Consulted and Agree with Plan of Care  Patient       Patient will benefit from skilled therapeutic intervention in order to improve the following deficits and impairments:  Abnormal gait, Decreased knowledge of precautions, Decreased strength, Pain, Decreased balance, Difficulty walking, Increased muscle spasms, Postural dysfunction  Visit Diagnosis: Acute left-sided low back pain with left-sided sciatica  Decreased strength  Impaired functional mobility, balance, gait, and endurance  Posture abnormality     Problem List Patient Active Problem List   Diagnosis Date Noted  . Hypothyroidism, postop 11/19/2016  . Cyst in hand 08/23/2014  . Preventative health care 07/10/2014  . Attention deficit disorder 07/10/2014  . Obesity (BMI 30-39.9) 12/26/2013  . GERD (gastroesophageal reflux disease) 11/17/2012  . Depression with anxiety 10/28/2010  . INSOMNIA 08/07/2010  . ALLERGIC RHINITIS 11/30/2008  . OSTEOPENIA 03/10/2008  . Hyperlipidemia 04/19/2007  . Left carotid bruit 04/19/2007  . Essential hypertension 04/07/2007  . Osteoarthritis 04/07/2007       Geraldine Solar PT, DPT  [facility] [address] [place], [place], [address] Phone: [phone]   Fax:  [phone]  Name: SHAKTHI SCIPIO MRN: [id number] Date of Birth: 04-Nov-1940

## 2017-09-29 DIAGNOSIS — M4726 Other spondylosis with radiculopathy, lumbar region: Secondary | ICD-10-CM | POA: Diagnosis not present

## 2017-09-29 DIAGNOSIS — M25551 Pain in right hip: Secondary | ICD-10-CM | POA: Diagnosis not present

## 2017-09-29 DIAGNOSIS — M19011 Primary osteoarthritis, right shoulder: Secondary | ICD-10-CM | POA: Diagnosis not present

## 2017-09-30 ENCOUNTER — Encounter (HOSPITAL_COMMUNITY): Payer: Self-pay

## 2017-09-30 ENCOUNTER — Ambulatory Visit (HOSPITAL_COMMUNITY): Payer: Medicare Other

## 2017-09-30 DIAGNOSIS — R293 Abnormal posture: Secondary | ICD-10-CM

## 2017-09-30 DIAGNOSIS — Z7409 Other reduced mobility: Secondary | ICD-10-CM | POA: Diagnosis not present

## 2017-09-30 DIAGNOSIS — R531 Weakness: Secondary | ICD-10-CM | POA: Diagnosis not present

## 2017-09-30 DIAGNOSIS — M5442 Lumbago with sciatica, left side: Secondary | ICD-10-CM | POA: Diagnosis not present

## 2017-09-30 NOTE — Therapy (Signed)
[facility] [place], [place], [address] Phone: [phone]   Fax:  [phone]  Physical Therapy Treatment  Patient Details  Name: [name] MRN: [id number] Date of Birth: [birth year] Referring Provider: Elsie Saas MD   Encounter Date: 09/30/2017  PT End of Session - 09/30/17 1412    Visit Number  7    Number of Visits  16    Date for PT Re-Evaluation  10/22/17    Authorization Type  [employer] Part A, [employer] Supplement    Authorization Time Period  08/20/2017-09/17/2016; NEW: 09/24/17 to 10/22/17    Authorization - Visit Number  7    Authorization - Number of Visits  10    PT Start Time  9024    PT Stop Time  1435    PT Time Calculation (min)  42 min    Activity Tolerance  Patient tolerated treatment well;No increased pain    Behavior During Therapy  WFL for tasks assessed/performed       Past Medical History:  Diagnosis Date  . ALLERGIC RHINITIS 11/30/2008  . ALLERGIC RHINITIS 11/30/2008  . ASTHMA UNSPECIFIED WITH EXACERBATION 02/17/2008  . CAROTID BRUIT, LEFT 04/19/2007  . Closed fracture of lateral malleolus 06/04/2009  . CLOSED FRACTURE OF METATARSAL BONE 06/04/2009  . Esophageal reflux 11/08/2008  . GANGLION CYST 04/19/2007  . HYPERLIPIDEMIA NEC/NOS 04/19/2007  . HYPERTENSION 04/07/2007  . INSOMNIA 08/07/2010  . OSTEOARTHRITIS 04/07/2007  . OSTEOPENIA 03/10/2008  . UTI 07/20/2009    Past Surgical History:  Procedure Laterality Date  . knuckle replacement    . TOTAL HIP ARTHROPLASTY    . TUBAL LIGATION      There were no vitals filed for this visit.  Subjective Assessment - 09/30/17 1357    Subjective  Pt stated she is feeling good today, reports she had got a lot of relief following manual last session.  Pt brought in referral from Dr Noemi Chapel to address Lower back and continue with Lt hip.    Pertinent History  2003 L hip replacement     Patient Stated Goals  To improve hip pain and decrease falling    Currently  in Pain?  No/denies                      [facility] Adult PT Treatment/Exercise - 09/30/17 0001      Lumbar Exercises: Supine   Bridge  15 reps      Knee/Hip Exercises: Sidelying   Clams  iso clams with belt 10x 10" holds each      Manual Therapy   Manual Therapy  Myofascial release    Manual therapy comments  manual therapy completed seperately from all other interventions    Myofascial Release  MFR to L glute med, max, piriformis, TFL          Balance Exercises - 09/30/17 1434      Balance Exercises: Standing   Tandem Stance  Eyes open;Foam/compliant surface;3 reps;30 secs    SLS  Eyes open;5 reps          PT Short Term Goals - 09/24/17 1347      PT SHORT TERM GOAL #1   Title  Pt will be independent with HEP to improve overall pain management and prevent reoccurring injury.     Baseline  Hurts too much to do them.    Time  2    Period  Weeks    Status  On-going  Target Date  10/08/17      PT SHORT TERM GOAL #2   Title  Pt will demonstrate improved ROM without increased symptoms to improve functional mobility during ADLs.     Time  2    Period  Weeks    Status  Achieved      PT SHORT TERM GOAL #3   Title  Pt will be able to maintain desired posture alignment to decrease strain on lumbar spine to improve quality of life.     Time  2    Period  Weeks    Status  On-going      PT SHORT TERM GOAL #4   Title  Pt will have increased hip strength by 1 MMT to improve stability and overall functional strength for ADLs.     Time  2    Period  Weeks    Status  On-going        PT Long Term Goals - 09/24/17 1347      PT LONG TERM GOAL #1   Title  Pt will have improved BERG balance score 53/56 to indicate decreased risk of falls.     Time  4    Period  Weeks    Status  Achieved    Target Date  10/22/17      PT LONG TERM GOAL #2   Title  Pt will have decreased reports of falls to at most 1 over two week period.    Baseline  No falls since she was  here last.     Time  4    Period  Weeks    Status  Partially Met      PT LONG TERM GOAL #3   Title  Pt will have improved hip strength by 2 MMT grades to improve stability during ambulation and functional ADLs.     Time  4    Period  Weeks    Status  On-going      PT LONG TERM GOAL #5   Title  Pt will demonstrate proper lifting mechanics to pick object off the floor at home to improve protection of lumbar spine during ADLs.    Time  4    Period  Weeks    Status  On-going            Plan - 09/30/17 1444    Clinical Impression Statement  Began session with MFR to address soft tissue restrictions in gluteal region for pain control, reports of relief following manual.  Cotninued therex focus on proximal strengthening with min cueing for form with task.  Began balance training with min A for safety to address deficits.  Pt will continues to benefit from gluteal strengthening and balance training wiht manual to address restrictions.      Rehab Potential  Good    PT Frequency  2x / week    PT Duration  4 weeks    PT Treatment/Interventions  ADLs/Self Care Home Management;Moist Heat;Balance training;Therapeutic exercise;Therapeutic activities;Functional mobility training;Stair training;Gait training;Neuromuscular re-education;Patient/family education;Manual techniques    PT Next Visit Plan  F/U with referral for LBP,  Continue with MFR L gluteal musculature; gluteal strengthening, and core strength, hip strength and dynamic balance;      PT Home Exercise Plan  eval: bridge, quad stretch, tandem and SL balance, hamstring st       Patient will benefit from skilled therapeutic intervention in order to improve the following deficits and impairments:  Abnormal gait, Decreased knowledge of  precautions, Decreased strength, Pain, Decreased balance, Difficulty walking, Increased muscle spasms, Postural dysfunction  Visit Diagnosis: Acute left-sided low back pain with left-sided  sciatica  Decreased strength  Impaired functional mobility, balance, gait, and endurance  Posture abnormality     Problem List Patient Active Problem List   Diagnosis Date Noted  . Hypothyroidism, postop 11/19/2016  . Cyst in hand 08/23/2014  . Preventative health care 07/10/2014  . Attention deficit disorder 07/10/2014  . Obesity (BMI 30-39.9) 12/26/2013  . GERD (gastroesophageal reflux disease) 11/17/2012  . Depression with anxiety 10/28/2010  . INSOMNIA 08/07/2010  . ALLERGIC RHINITIS 11/30/2008  . OSTEOPENIA 03/10/2008  . Hyperlipidemia 04/19/2007  . Left carotid bruit 04/19/2007  . Essential hypertension 04/07/2007  . Osteoarthritis 04/07/2007   Ihor Austin, LPTA; [place]  Aldona Lento 09/30/2017, 4:26 PM  [facility] [address] [place], [place], [address] Phone: [phone]   Fax:  [phone]  Name: NIJAE DOYEL MRN: [id number] Date of Birth: November 21, 1940

## 2017-10-05 ENCOUNTER — Ambulatory Visit (HOSPITAL_COMMUNITY): Payer: Medicare Other | Admitting: Physical Therapy

## 2017-10-05 DIAGNOSIS — R293 Abnormal posture: Secondary | ICD-10-CM | POA: Diagnosis not present

## 2017-10-05 DIAGNOSIS — R531 Weakness: Secondary | ICD-10-CM | POA: Diagnosis not present

## 2017-10-05 DIAGNOSIS — M5442 Lumbago with sciatica, left side: Secondary | ICD-10-CM

## 2017-10-05 DIAGNOSIS — Z7409 Other reduced mobility: Secondary | ICD-10-CM

## 2017-10-05 NOTE — Therapy (Signed)
[place], [place], [address] Phone: [phone]   Fax:  [phone]  Physical Therapy Treatment  Patient Details  Name: [name] MRN: [id number] Date of Birth: Jan 30, 1941 Referring Provider: Elsie Saas MD   Encounter Date: 10/05/2017  PT End of Session - 10/05/17 1454    Visit Number  8    Number of Visits  16    Date for PT Re-Evaluation  10/22/17    Authorization Type  [employer] Part A, BCBS Supplement    Authorization Time Period  08/20/2017-09/17/2016; NEW: 09/24/17 to 10/22/17    Authorization - Visit Number  8    Authorization - Number of Visits  10    PT Start Time  1309    PT Stop Time  0623    PT Time Calculation (min)  46 min    Activity Tolerance  Patient tolerated treatment well;No increased pain    Behavior During Therapy  WFL for tasks assessed/performed       Past Medical History:  Diagnosis Date  . ALLERGIC RHINITIS 11/30/2008  . ALLERGIC RHINITIS 11/30/2008  . ASTHMA UNSPECIFIED WITH EXACERBATION 02/17/2008  . CAROTID BRUIT, LEFT 04/19/2007  . Closed fracture of lateral malleolus 06/04/2009  . CLOSED FRACTURE OF METATARSAL BONE 06/04/2009  . Esophageal reflux 11/08/2008  . GANGLION CYST 04/19/2007  . HYPERLIPIDEMIA NEC/NOS 04/19/2007  . HYPERTENSION 04/07/2007  . INSOMNIA 08/07/2010  . OSTEOARTHRITIS 04/07/2007  . OSTEOPENIA 03/10/2008  . UTI 07/20/2009    Past Surgical History:  Procedure Laterality Date  . knuckle replacement    . TOTAL HIP ARTHROPLASTY    . TUBAL LIGATION      There were no vitals filed for this visit.  Subjective Assessment - 10/05/17 1459    Subjective  PT states she is doing so much better with her hip and hasn't had pain in 2 days.  States she does have the new orders to treat her LB from Dr Noemi Chapel she gave the [employer] visit.      Currently in Pain?  No/denies                      [facility] Adult PT Treatment/Exercise - 10/05/17 0001      Manual Therapy   Manual Therapy  Myofascial release    Manual therapy comments  manual therapy completed seperately from all other interventions    Soft tissue mobilization  Soft tissue mobilization to gluteal muscles on the left lower extremity    Myofascial Release  MFR to L glute med, max, piriformis, TFL          Balance Exercises - 10/05/17 1317      Balance Exercises: Standing   Tandem Stance  Eyes open;Foam/compliant surface;3 reps;30 secs    SLS  Eyes open;5 reps    SLS with Vectors  5 reps 3" hold with 1 HHA    Tandem Gait  Forward;2 reps    Sidestepping  2 reps;Theraband BTB    Other Standing Exercises  tandem on foam with 3# cane lift 5 reps with each LE lead          PT Short Term Goals - 09/24/17 1347      PT SHORT TERM GOAL #1   Title  Pt will be independent with HEP to improve overall pain management and prevent reoccurring injury.     Baseline  Hurts too much to do them.    Time  2  Period  Weeks    Status  On-going    Target Date  10/08/17      PT SHORT TERM GOAL #2   Title  Pt will demonstrate improved ROM without increased symptoms to improve functional mobility during ADLs.     Time  2    Period  Weeks    Status  Achieved      PT SHORT TERM GOAL #3   Title  Pt will be able to maintain desired posture alignment to decrease strain on lumbar spine to improve quality of life.     Time  2    Period  Weeks    Status  On-going      PT SHORT TERM GOAL #4   Title  Pt will have increased hip strength by 1 MMT to improve stability and overall functional strength for ADLs.     Time  2    Period  Weeks    Status  On-going        PT Long Term Goals - 09/24/17 1347      PT LONG TERM GOAL #1   Title  Pt will have improved BERG balance score 53/56 to indicate decreased risk of falls.     Time  4    Period  Weeks    Status  Achieved    Target Date  10/22/17      PT LONG TERM GOAL #2   Title  Pt will have decreased reports of falls to at most 1  over two week period.    Baseline  No falls since she was here last.     Time  4    Period  Weeks    Status  Partially Met      PT LONG TERM GOAL #3   Title  Pt will have improved hip strength by 2 MMT grades to improve stability during ambulation and functional ADLs.     Time  4    Period  Weeks    Status  On-going      PT LONG TERM GOAL #5   Title  Pt will demonstrate proper lifting mechanics to pick object off the floor at home to improve protection of lumbar spine during ADLs.    Time  4    Period  Weeks    Status  On-going            Plan - 10/05/17 1454    Clinical Impression Statement  Pt wtih new orders for LB brought in last session to be addressed next visit.  Continued with focus on hip pain and general balance issues.  Pt with sinus issues this visit, having to stop frequently to address this.  Progressed with vector stance, tandem work and addition of side stepping with theraband.  Pt with loud popping in bilateral shoulders with flexion activity (Lt>Rt), however without pain.    manual completed at EOS with most tenderness in piriformis region and ITB.  Pt reported overall relief at EOS.    Rehab Potential  Good    PT Frequency  2x / week    PT Duration  4 weeks    PT Treatment/Interventions  ADLs/Self Care Home Management;Moist Heat;Balance training;Therapeutic exercise;Therapeutic activities;Functional mobility training;Stair training;Gait training;Neuromuscular re-education;Patient/family education;Manual techniques    PT Next Visit Plan  F/U with referral for LBP next session.      PT Home Exercise Plan  eval: bridge, quad stretch, tandem and SL balance, hamstring st  Patient will benefit from skilled therapeutic intervention in order to improve the following deficits and impairments:  Abnormal gait, Decreased knowledge of precautions, Decreased strength, Pain, Decreased balance, Difficulty walking, Increased muscle spasms, Postural dysfunction  Visit  Diagnosis: Acute left-sided low back pain with left-sided sciatica  Decreased strength  Impaired functional mobility, balance, gait, and endurance  Posture abnormality     Problem List Patient Active Problem List   Diagnosis Date Noted  . Hypothyroidism, postop 11/19/2016  . Cyst in hand 08/23/2014  . Preventative health care 07/10/2014  . Attention deficit disorder 07/10/2014  . Obesity (BMI 30-39.9) 12/26/2013  . GERD (gastroesophageal reflux disease) 11/17/2012  . Depression with anxiety 10/28/2010  . INSOMNIA 08/07/2010  . ALLERGIC RHINITIS 11/30/2008  . OSTEOPENIA 03/10/2008  . Hyperlipidemia 04/19/2007  . Left carotid bruit 04/19/2007  . Essential hypertension 04/07/2007  . Osteoarthritis 04/07/2007   Teena Irani, PTA/CLT [id number]  Teena Irani 10/05/2017, 3:03 PM  [place], [place], [address] Phone: [phone]   Fax:  [phone]  Name: [name] MRN: [id number] Date of Birth: 1940-10-04

## 2017-10-07 ENCOUNTER — Ambulatory Visit (HOSPITAL_COMMUNITY): Payer: Medicare Other

## 2017-10-07 ENCOUNTER — Encounter (HOSPITAL_COMMUNITY): Payer: Self-pay

## 2017-10-07 DIAGNOSIS — R531 Weakness: Secondary | ICD-10-CM

## 2017-10-07 DIAGNOSIS — Z7409 Other reduced mobility: Secondary | ICD-10-CM

## 2017-10-07 DIAGNOSIS — R293 Abnormal posture: Secondary | ICD-10-CM | POA: Diagnosis not present

## 2017-10-07 DIAGNOSIS — M5442 Lumbago with sciatica, left side: Secondary | ICD-10-CM | POA: Diagnosis not present

## 2017-10-07 NOTE — Therapy (Signed)
[place], [place], [address] Phone: [phone]   Fax:  [phone]  Physical Therapy Treatment  Patient Details  Name: [name] MRN: [id number] Date of Birth: 1941/05/22 Referring Provider: Elsie Saas MD   Encounter Date: 10/07/2017  PT End of Session - 10/07/17 1358    Visit Number  9    Number of Visits  16    Date for PT Re-Evaluation  10/22/17    Authorization Type  [employer] Part A, BCBS Supplement    Authorization Time Period  08/20/2017-09/17/2016; NEW: 09/24/17 to 10/22/17    Authorization - Visit Number  9    Authorization - Number of Visits  10    PT Start Time  7998 pt arrived late    PT Stop Time  1427    PT Time Calculation (min)  30 min    Activity Tolerance  Patient tolerated treatment well;No increased pain    Behavior During Therapy  WFL for tasks assessed/performed       Past Medical History:  Diagnosis Date  . ALLERGIC RHINITIS 11/30/2008  . ALLERGIC RHINITIS 11/30/2008  . ASTHMA UNSPECIFIED WITH EXACERBATION 02/17/2008  . CAROTID BRUIT, LEFT 04/19/2007  . Closed fracture of lateral malleolus 06/04/2009  . CLOSED FRACTURE OF METATARSAL BONE 06/04/2009  . Esophageal reflux 11/08/2008  . GANGLION CYST 04/19/2007  . HYPERLIPIDEMIA NEC/NOS 04/19/2007  . HYPERTENSION 04/07/2007  . INSOMNIA 08/07/2010  . OSTEOARTHRITIS 04/07/2007  . OSTEOPENIA 03/10/2008  . UTI 07/20/2009    Past Surgical History:  Procedure Laterality Date  . knuckle replacement    . TOTAL HIP ARTHROPLASTY    . TUBAL LIGATION      There were no vitals filed for this visit.  Subjective Assessment - 10/07/17 1358    Subjective  Pt states that her hip and lateral leg are so much better and hasn't had pain in 3 days.     Currently in Pain?  No/denies            [facility] Adult PT Treatment/Exercise - 10/07/17 0001      Exercises   Exercises  Knee/Hip      Knee/Hip Exercises: Stretches   Piriformis Stretch  Left;3  reps;30 seconds    Piriformis Stretch Limitations  supine      Knee/Hip Exercises: Seated   Abduction/Adduction   Both;10 reps;Limitations    Abd/Adduction Limitations  GTB    Sit to Sand  without UE support;20 reps      Knee/Hip Exercises: Supine   Bridges  Both;2 sets;15 reps    Bridges Limitations  2nd set with GTB around [employer]      Knee/Hip Exercises: Sidelying   Clams  x15 with GTB      Manual Therapy   Manual Therapy  Myofascial release    Manual therapy comments  manual therapy completed seperately from all other interventions    Myofascial Release  MFR to L glute med, max, piriformis           PT Education - 10/07/17 1358    Education provided  Yes    Education Details  LBP and hip pain relations; continue HEP, exercise technique    Person(s) Educated  Patient    Methods  Explanation;Demonstration    Comprehension  Verbalized understanding;Returned demonstration       PT Short Term Goals - 09/24/17 1347      PT SHORT TERM GOAL #1   Title  Pt will be independent  with HEP to improve overall pain management and prevent reoccurring injury.     Baseline  Hurts too much to do them.    Time  2    Period  Weeks    Status  On-going    Target Date  10/08/17      PT SHORT TERM GOAL #2   Title  Pt will demonstrate improved ROM without increased symptoms to improve functional mobility during ADLs.     Time  2    Period  Weeks    Status  Achieved      PT SHORT TERM GOAL #3   Title  Pt will be able to maintain desired posture alignment to decrease strain on lumbar spine to improve quality of life.     Time  2    Period  Weeks    Status  On-going      PT SHORT TERM GOAL #4   Title  Pt will have increased hip strength by 1 MMT to improve stability and overall functional strength for ADLs.     Time  2    Period  Weeks    Status  On-going        PT Long Term Goals - 09/24/17 1347      PT LONG TERM GOAL #1   Title  Pt will have improved BERG balance score  53/56 to indicate decreased risk of falls.     Time  4    Period  Weeks    Status  Achieved    Target Date  10/22/17      PT LONG TERM GOAL #2   Title  Pt will have decreased reports of falls to at most 1 over two week period.    Baseline  No falls since she was here last.     Time  4    Period  Weeks    Status  Partially Met      PT LONG TERM GOAL #3   Title  Pt will have improved hip strength by 2 MMT grades to improve stability during ambulation and functional ADLs.     Time  4    Period  Weeks    Status  On-going      PT LONG TERM GOAL #5   Title  Pt will demonstrate proper lifting mechanics to pick object off the floor at home to improve protection of lumbar spine during ADLs.    Time  4    Period  Weeks    Status  On-going            Plan - 10/07/17 1428    Clinical Impression Statement  Began session by educating pt that LBP had been on her initial referral since her initial eval back in December. PT explained that her hip had become the primary focus of her sessions as it was determined to be the cause and source of her pain. Pt stated during session that she had LBP. When asked to pinpoint her LBP, pt stated "I don't have LBP. I have pain here" and pointed to her L gluteal muscles. She then went on to say that she wasn't sure why Dr. Noemi Chapel added her LBP to her referral and was entered as her primary PT visit diagnosis. PT explained that LBP and hip pain go hand-in-hand and play into each other. Due to pt pinpointing that her pain was located at her L gluteals and MFR to that area recreates her pain, will continue to focus  on her hip going forward, but can address any LBP complaints that may be bothering the pt. Performed MFR to start session and ended with gluteal strengthening. Pt reported no pain at EOS.     Rehab Potential  Good    PT Frequency  2x / week    PT Duration  4 weeks    PT Treatment/Interventions  ADLs/Self Care Home Management;Moist Heat;Balance  training;Therapeutic exercise;Therapeutic activities;Functional mobility training;Stair training;Gait training;Neuromuscular re-education;Patient/family education;Manual techniques    PT Next Visit Plan  cotninue with established POC performing MFR to L gluteals and focus on glute strength, hip stability, and core strengthening; address any LBP that pt may come to PT with, however, pt denies having any LBP    PT Home Exercise Plan  eval: bridge, quad stretch, tandem and SL balance, hamstring st    Consulted and Agree with Plan of Care  Patient       Patient will benefit from skilled therapeutic intervention in order to improve the following deficits and impairments:  Abnormal gait, Decreased knowledge of precautions, Decreased strength, Pain, Decreased balance, Difficulty walking, Increased muscle spasms, Postural dysfunction  Visit Diagnosis: Acute left-sided low back pain with left-sided sciatica  Decreased strength  Impaired functional mobility, balance, gait, and endurance  Posture abnormality     Problem List Patient Active Problem List   Diagnosis Date Noted  . Hypothyroidism, postop 11/19/2016  . Cyst in hand 08/23/2014  . Preventative health care 07/10/2014  . Attention deficit disorder 07/10/2014  . Obesity (BMI 30-39.9) 12/26/2013  . GERD (gastroesophageal reflux disease) 11/17/2012  . Depression with anxiety 10/28/2010  . INSOMNIA 08/07/2010  . ALLERGIC RHINITIS 11/30/2008  . OSTEOPENIA 03/10/2008  . Hyperlipidemia 04/19/2007  . Left carotid bruit 04/19/2007  . Essential hypertension 04/07/2007  . Osteoarthritis 04/07/2007      Geraldine Solar PT, DPT  [place] [address] [place], [place], [address] Phone: [phone]   Fax:  [phone]  Name: [name] MRN: [id number] Date of Birth: 04/01/1941

## 2017-10-13 NOTE — Addendum Note (Signed)
Addended by: Clarene Critchley on: 10/13/2017 01:56 PM   Modules accepted: Orders

## 2017-10-14 ENCOUNTER — Telehealth: Payer: Self-pay | Admitting: Endocrinology

## 2017-10-14 ENCOUNTER — Encounter (HOSPITAL_COMMUNITY): Payer: Self-pay

## 2017-10-14 ENCOUNTER — Ambulatory Visit (HOSPITAL_COMMUNITY): Payer: Medicare Other

## 2017-10-14 ENCOUNTER — Other Ambulatory Visit: Payer: Self-pay | Admitting: Endocrinology

## 2017-10-14 DIAGNOSIS — Z7409 Other reduced mobility: Secondary | ICD-10-CM

## 2017-10-14 DIAGNOSIS — R531 Weakness: Secondary | ICD-10-CM | POA: Diagnosis not present

## 2017-10-14 DIAGNOSIS — R293 Abnormal posture: Secondary | ICD-10-CM | POA: Diagnosis not present

## 2017-10-14 DIAGNOSIS — M5442 Lumbago with sciatica, left side: Secondary | ICD-10-CM | POA: Diagnosis not present

## 2017-10-14 DIAGNOSIS — E890 Postprocedural hypothyroidism: Secondary | ICD-10-CM | POA: Diagnosis not present

## 2017-10-14 LAB — T4, FREE: FREE T4: 1 ng/dL (ref 0.8–1.8)

## 2017-10-14 LAB — TSH: TSH: 3.22 mIU/L (ref 0.40–4.50)

## 2017-10-14 LAB — T3, FREE: T3, Free: 2.7 pg/mL (ref 2.3–4.2)

## 2017-10-14 NOTE — Telephone Encounter (Signed)
[name] from [employer] [employer] in [place] needs to [permission to add Orders for Labs-she has [employer] but that is all-patient is there right now

## 2017-10-14 NOTE — Therapy (Signed)
[place], [place], [address] Phone: [phone]   Fax:  [phone]  Physical Therapy Treatment  Patient Details  Name: [name] MRN: [id number] Date of Birth: 1941-08-23 Referring Provider: Elsie Saas MD   Encounter Date: 10/14/2017  PT End of Session - 10/14/17 1446    Visit Number  10    Number of Visits  16    Date for PT Re-Evaluation  10/22/17    Authorization Type  [employer] Part A, BCBS Supplement    Authorization Time Period  08/20/2017-09/17/2016; NEW: 09/24/17 to 10/22/17    Authorization - Visit Number  10    Authorization - Number of Visits  10    PT Start Time  7416    PT Stop Time  1523    PT Time Calculation (min)  49 min    Activity Tolerance  Patient tolerated treatment well;No increased pain    Behavior During Therapy  WFL for tasks assessed/performed       Past Medical History:  Diagnosis Date  . ALLERGIC RHINITIS 11/30/2008  . ALLERGIC RHINITIS 11/30/2008  . ASTHMA UNSPECIFIED WITH EXACERBATION 02/17/2008  . CAROTID BRUIT, LEFT 04/19/2007  . Closed fracture of lateral malleolus 06/04/2009  . CLOSED FRACTURE OF METATARSAL BONE 06/04/2009  . Esophageal reflux 11/08/2008  . GANGLION CYST 04/19/2007  . HYPERLIPIDEMIA NEC/NOS 04/19/2007  . HYPERTENSION 04/07/2007  . INSOMNIA 08/07/2010  . OSTEOARTHRITIS 04/07/2007  . OSTEOPENIA 03/10/2008  . UTI 07/20/2009    Past Surgical History:  Procedure Laterality Date  . knuckle replacement    . TOTAL HIP ARTHROPLASTY    . TUBAL LIGATION      There were no vitals filed for this visit.  Subjective Assessment - 10/14/17 1432    Subjective  Pt stated she has increased pain on anterior Lt thigh, only relief with sitting or laying down with MHP, current pain scale 1/10 Lt anterior and lateral thigh, reports increased pain with sit to stand.      Pertinent History  2003 L hip replacement     Patient Stated Goals  To improve hip pain and decrease  falling    Currently in Pain?  Yes    Pain Score  1     Pain Location  Hip    Pain Orientation  Left;Lateral    Pain Descriptors / Indicators  Stabbing    Pain Type  Chronic pain    Pain Radiating Towards  down to anterior Lt knee    Pain Onset  1 to 4 weeks ago    Pain Frequency  Constant    Aggravating Factors   initial standing up from chair, vaccum, housework    Pain Relieving Factors  nothing for hip, educated on trial of heat    Effect of Pain on Daily Activities  mod effect on ADL completion                      [facility] Adult PT Treatment/Exercise - 10/14/17 0001      Lumbar Exercises: Stretches   Sports administrator  Left;3 reps;30 seconds      Lumbar Exercises: Standing   Wall Slides  10 reps;3 seconds crepitus Rt knee      Lumbar Exercises: Seated   Sit to Stand  10 reps eccentric control      Lumbar Exercises: Supine   Bridge  15 reps    Bridge Limitations  2 sets  Lumbar Exercises: Sidelying   Hip Abduction  15 reps      Manual Therapy   Manual Therapy  Myofascial release    Manual therapy comments  manual therapy completed seperately from all other interventions    Myofascial Release  MFR to L glute med, max, piriformis, QFL/ITB          Balance Exercises - 10/14/17 1537      Balance Exercises: Standing   Tandem Stance  Eyes open;Foam/compliant surface;3 reps;30 secs    SLS  Eyes open;5 reps    Sidestepping  2 reps;Theraband          PT Short Term Goals - 09/24/17 1347      PT SHORT TERM GOAL #1   Title  Pt will be independent with HEP to improve overall pain management and prevent reoccurring injury.     Baseline  Hurts too much to do them.    Time  2    Period  Weeks    Status  On-going    Target Date  10/08/17      PT SHORT TERM GOAL #2   Title  Pt will demonstrate improved ROM without increased symptoms to improve functional mobility during ADLs.     Time  2    Period  Weeks    Status  Achieved      PT SHORT TERM GOAL  #3   Title  Pt will be able to maintain desired posture alignment to decrease strain on lumbar spine to improve quality of life.     Time  2    Period  Weeks    Status  On-going      PT SHORT TERM GOAL #4   Title  Pt will have increased hip strength by 1 MMT to improve stability and overall functional strength for ADLs.     Time  2    Period  Weeks    Status  On-going        PT Long Term Goals - 09/24/17 1347      PT LONG TERM GOAL #1   Title  Pt will have improved BERG balance score 53/56 to indicate decreased risk of falls.     Time  4    Period  Weeks    Status  Achieved    Target Date  10/22/17      PT LONG TERM GOAL #2   Title  Pt will have decreased reports of falls to at most 1 over two week period.    Baseline  No falls since she was here last.     Time  4    Period  Weeks    Status  Partially Met      PT LONG TERM GOAL #3   Title  Pt will have improved hip strength by 2 MMT grades to improve stability during ambulation and functional ADLs.     Time  4    Period  Weeks    Status  On-going      PT LONG TERM GOAL #5   Title  Pt will demonstrate proper lifting mechanics to pick object off the floor at home to improve protection of lumbar spine during ADLs.    Time  4    Period  Weeks    Status  On-going            Plan - 10/14/17 1528    Clinical Impression Statement  Pt entered reporting groin pain that has returned following last  session.  Reviewed last activities complete last session and pt unable to remember what caused the return of symptoms.  Session focus on gluteal strengthening and hip stability to address weakness to assist with pain.  Pt c/o groin pain with sit to stands, began wall slides with no reoprts of pain, noted crepitus Rt knee with activity.  Pt educated on pain due to weakness with task.  EOS with manual soft tissue mobilization to address restrictions Lt gluteal musculature with  additional ITB work.  Pt reports vast improvements  following manual and ability to complete sit to stand without pain.    Rehab Potential  Good    PT Frequency  2x / week    PT Duration  4 weeks    PT Treatment/Interventions  ADLs/Self Care Home Management;Moist Heat;Balance training;Therapeutic exercise;Therapeutic activities;Functional mobility training;Stair training;Gait training;Neuromuscular re-education;Patient/family education;Manual techniques    PT Next Visit Plan  cotninue with established POC performing MFR to L gluteals and focus on glute strength, hip stability, and core strengthening; address any LBP that pt may come to PT with, however, pt denies having any LBP    PT Home Exercise Plan  eval: bridge, quad stretch, tandem and SL balance, hamstring st       Patient will benefit from skilled therapeutic intervention in order to improve the following deficits and impairments:  Abnormal gait, Decreased knowledge of precautions, Decreased strength, Pain, Decreased balance, Difficulty walking, Increased muscle spasms, Postural dysfunction  Visit Diagnosis: Acute left-sided low back pain with left-sided sciatica  Decreased strength  Impaired functional mobility, balance, gait, and endurance  Posture abnormality     Problem List Patient Active Problem List   Diagnosis Date Noted  . Hypothyroidism, postop 11/19/2016  . Cyst in hand 08/23/2014  . Preventative health care 07/10/2014  . Attention deficit disorder 07/10/2014  . Obesity (BMI 30-39.9) 12/26/2013  . GERD (gastroesophageal reflux disease) 11/17/2012  . Depression with anxiety 10/28/2010  . INSOMNIA 08/07/2010  . ALLERGIC RHINITIS 11/30/2008  . OSTEOPENIA 03/10/2008  . Hyperlipidemia 04/19/2007  . Left carotid bruit 04/19/2007  . Essential hypertension 04/07/2007  . Osteoarthritis 04/07/2007   Ihor Austin, [place]; [place]  Aldona Lento 10/14/2017, 3:41 PM  [place], [place], [address] Phone: [phone]   Fax:  [phone]  Name: [name] MRN: [id number] Date of Birth: October 12, 1940

## 2017-10-15 ENCOUNTER — Encounter: Payer: Self-pay | Admitting: Family Medicine

## 2017-10-16 ENCOUNTER — Ambulatory Visit (HOSPITAL_COMMUNITY): Payer: Medicare Other | Attending: Orthopedic Surgery

## 2017-10-16 ENCOUNTER — Encounter (HOSPITAL_COMMUNITY): Payer: Self-pay

## 2017-10-16 DIAGNOSIS — M5442 Lumbago with sciatica, left side: Secondary | ICD-10-CM | POA: Diagnosis not present

## 2017-10-16 DIAGNOSIS — R531 Weakness: Secondary | ICD-10-CM | POA: Insufficient documentation

## 2017-10-16 DIAGNOSIS — R293 Abnormal posture: Secondary | ICD-10-CM | POA: Insufficient documentation

## 2017-10-16 DIAGNOSIS — Z7409 Other reduced mobility: Secondary | ICD-10-CM | POA: Diagnosis not present

## 2017-10-16 NOTE — Therapy (Signed)
[name], [place], [address] Phone: [phone]   Fax:  [phone]  Physical Therapy Treatment  Patient Details  Name: [name] MRN: [id number] Date of Birth: 06-21-1941 Referring Provider: Elsie Saas MD   Encounter Date: 10/16/2017  PT End of Session - 10/16/17 1412    Visit Number  11    Number of Visits  16    Date for PT Re-Evaluation  10/22/17    Authorization Type  [employer] Part [employer], [employer]BS Supplement    Authorization Time Period  08/20/2017-09/17/2016; NEW: 09/24/17 to 10/22/17    Authorization - Visit Number  11    Authorization - Number of Visits  16    PT Start Time  1400    PT Stop Time  1432    PT Time Calculation (min)  32 min    Activity Tolerance  Patient tolerated treatment well;No increased pain    Behavior During Therapy  WFL for tasks assessed/performed       Past Medical History:  Diagnosis Date  . ALLERGIC RHINITIS 11/30/2008  . ALLERGIC RHINITIS 11/30/2008  . ASTHMA UNSPECIFIED WITH EXACERBATION 02/17/2008  . CAROTID BRUIT, LEFT 04/19/2007  . Closed fracture of lateral malleolus 06/04/2009  . CLOSED FRACTURE OF METATARSAL BONE 06/04/2009  . Esophageal reflux 11/08/2008  . GANGLION CYST 04/19/2007  . HYPERLIPIDEMIA NEC/NOS 04/19/2007  . HYPERTENSION 04/07/2007  . INSOMNIA 08/07/2010  . OSTEOARTHRITIS 04/07/2007  . OSTEOPENIA 03/10/2008  . UTI 07/20/2009    Past Surgical History:  Procedure Laterality Date  . knuckle replacement    . TOTAL HIP ARTHROPLASTY    . TUBAL LIGATION      There were no vitals filed for this visit.  Subjective Assessment - 10/16/17 1403    Subjective  Pt late for apt today, reports she got distracted on the computer.  No reports of current pain but has been c/o Lt thigh up to groin pain.  Reports she found pain relief with extension.      Patient Stated Goals  To improve hip pain and decrease falling    Currently in Pain?  No/denies                       [facility] Adult PT Treatment/Exercise - 10/16/17 0001      Bed Mobility   Bed Mobility  Sit to Sidelying Left    Sit to Sidelying Left  5: Supervision    Sit to Sidelying Left Details (indicate cue type and reason)  Reviewed proper bed mobility mechanics      Lumbar Exercises: Stretches   Hip Flexor Stretch  2 reps;30 seconds standing on 12in step      Lumbar Exercises: Seated   Other Seated Lumbar Exercises  sitting on ball scapular retraction RTB 15x, isometric abd/add; LAQ with ab set      Lumbar Exercises: Supine   Bridge  15 reps    Bridge Limitations  2 sets      Manual Therapy   Manual Therapy  Myofascial release    Manual therapy comments  manual therapy completed seperately from all other interventions    Soft tissue mobilization  Soft tissue mobilization to hip flexors/groin and gluteal muscles on the left lower extremity               PT Short Term Goals - 09/24/17 1347      PT SHORT TERM GOAL #1   Title  Pt  will be independent with HEP to improve overall pain management and prevent reoccurring injury.     Baseline  Hurts too much to do them.    Time  2    Period  Weeks    Status  On-going    Target Date  10/08/17      PT SHORT TERM GOAL #2   Title  Pt will demonstrate improved ROM without increased symptoms to improve functional mobility during ADLs.     Time  2    Period  Weeks    Status  Achieved      PT SHORT TERM GOAL #3   Title  Pt will be able to maintain desired posture alignment to decrease strain on lumbar spine to improve quality of life.     Time  2    Period  Weeks    Status  On-going      PT SHORT TERM GOAL #4   Title  Pt will have increased hip strength by 1 MMT to improve stability and overall functional strength for ADLs.     Time  2    Period  Weeks    Status  On-going        PT Long Term Goals - 09/24/17 1347      PT LONG TERM GOAL #1   Title  Pt will have improved BERG balance score 53/56  to indicate decreased risk of falls.     Time  4    Period  Weeks    Status  Achieved    Target Date  10/22/17      PT LONG TERM GOAL #2   Title  Pt will have decreased reports of falls to at most 1 over two week period.    Baseline  No falls since she was here last.     Time  4    Period  Weeks    Status  Partially Met      PT LONG TERM GOAL #3   Title  Pt will have improved hip strength by 2 MMT grades to improve stability during ambulation and functional ADLs.     Time  4    Period  Weeks    Status  On-going      PT LONG TERM GOAL #5   Title  Pt will demonstrate proper lifting mechanics to pick object off the floor at home to improve protection of lumbar spine during ADLs.    Time  4    Period  Weeks    Status  On-going            Plan - 10/16/17 1416    Clinical Impression Statement  Pt late for apt.  Session focus on core and proximal strengthening.  Began postural strengthening exercises while sitting on ball to improve core activation for strengthening with noted instability due to weakness.  Pt with intermittent c/o groin pain through session, educated on importance of proper bed mobility and importance of posture to reduce stress on lumbar muscualture.  Added hip flexor stretches with reports of relief following.  EOS with manual soft tissue mobilization for pain relief.  No reports of pain at EOS.    Rehab Potential  Good    PT Frequency  2x / week    PT Duration  4 weeks    PT Treatment/Interventions  ADLs/Self Care Home Management;Moist Heat;Balance training;Therapeutic exercise;Therapeutic activities;Functional mobility training;Stair training;Gait training;Neuromuscular re-education;Patient/family education;Manual techniques    PT Next Visit Plan  cotninue with  established POC performing MFR to L gluteals and focus on glute strength, hip stability, and core strengthening; address any LBP that pt may come to PT with, however, pt denies having any LBP    PT Home  Exercise Plan  eval: bridge, quad stretch, tandem and SL balance, hamstring st       Patient will benefit from skilled therapeutic intervention in order to improve the following deficits and impairments:  Abnormal gait, Decreased knowledge of precautions, Decreased strength, Pain, Decreased balance, Difficulty walking, Increased muscle spasms, Postural dysfunction  Visit Diagnosis: Acute left-sided low back pain with left-sided sciatica  Decreased strength  Impaired functional mobility, balance, gait, and endurance  Posture abnormality     Problem List Patient Active Problem List   Diagnosis Date Noted  . Hypothyroidism, postop 11/19/2016  . Cyst in hand 08/23/2014  . Preventative health care 07/10/2014  . Attention deficit disorder 07/10/2014  . Obesity (BMI 30-39.9) 12/26/2013  . GERD (gastroesophageal reflux disease) 11/17/2012  . Depression with anxiety 10/28/2010  . INSOMNIA 08/07/2010  . ALLERGIC RHINITIS 11/30/2008  . OSTEOPENIA 03/10/2008  . Hyperlipidemia 04/19/2007  . Left carotid bruit 04/19/2007  . Essential hypertension 04/07/2007  . Osteoarthritis 04/07/2007   Ihor Austin, LPTA; [place]  Aldona Lento 10/16/2017, 4:24 PM  [facility] [address] [place], [place], [address] Phone: [phone]   Fax:  [phone]  Name: HAWRAA STAMBAUGH MRN: [id number] Date of Birth: July 07, 1941

## 2017-10-19 ENCOUNTER — Ambulatory Visit (HOSPITAL_COMMUNITY): Payer: Medicare Other

## 2017-10-19 ENCOUNTER — Telehealth (HOSPITAL_COMMUNITY): Payer: Self-pay | Admitting: Internal Medicine

## 2017-10-19 ENCOUNTER — Ambulatory Visit (INDEPENDENT_AMBULATORY_CARE_PROVIDER_SITE_OTHER): Payer: Medicare Other | Admitting: Otolaryngology

## 2017-10-19 ENCOUNTER — Other Ambulatory Visit (INDEPENDENT_AMBULATORY_CARE_PROVIDER_SITE_OTHER): Payer: Self-pay | Admitting: Otolaryngology

## 2017-10-19 DIAGNOSIS — G9600 Cerebrospinal fluid leak, unspecified: Secondary | ICD-10-CM

## 2017-10-19 DIAGNOSIS — J310 Chronic rhinitis: Secondary | ICD-10-CM

## 2017-10-19 NOTE — Telephone Encounter (Signed)
10/19/17  pts husband left a message to cx said that patient had another appt scheduled for today

## 2017-10-19 NOTE — Telephone Encounter (Signed)
I see a free T3 was added to the orders on 10-14-2017 and has been completed.

## 2017-10-20 ENCOUNTER — Other Ambulatory Visit: Payer: Self-pay | Admitting: Emergency Medicine

## 2017-10-20 DIAGNOSIS — J3489 Other specified disorders of nose and nasal sinuses: Secondary | ICD-10-CM

## 2017-10-20 DIAGNOSIS — R0989 Other specified symptoms and signs involving the circulatory and respiratory systems: Secondary | ICD-10-CM

## 2017-10-21 ENCOUNTER — Encounter (HOSPITAL_COMMUNITY): Payer: Self-pay

## 2017-10-21 ENCOUNTER — Ambulatory Visit (HOSPITAL_COMMUNITY): Payer: Medicare Other

## 2017-10-21 DIAGNOSIS — R293 Abnormal posture: Secondary | ICD-10-CM | POA: Diagnosis not present

## 2017-10-21 DIAGNOSIS — R531 Weakness: Secondary | ICD-10-CM | POA: Diagnosis not present

## 2017-10-21 DIAGNOSIS — Z7409 Other reduced mobility: Secondary | ICD-10-CM

## 2017-10-21 DIAGNOSIS — M5442 Lumbago with sciatica, left side: Secondary | ICD-10-CM

## 2017-10-21 NOTE — Therapy (Signed)
[place], [place], [address] Phone: [phone]   Fax:  [phone]  Physical Therapy Treatment  Patient Details  Name: [name] MRN: [id number] Date of Birth: 1941-07-15 Referring Provider: Elsie Saas MD   Encounter Date: 10/21/2017  PT End of Session - 10/21/17 1438    Visit Number  12    Number of Visits  16    Date for PT Re-Evaluation  10/22/17    Authorization Type  [employer] Part A, [employer]BS Supplement    Authorization Time Period  08/20/2017-09/17/2016; NEW: 09/24/17 to 10/22/17    Authorization - Visit Number  12    Authorization - Number of Visits  16    PT Start Time  9675 pt arrived late    PT Stop Time  1510    PT Time Calculation (min)  32 min    Activity Tolerance  Patient tolerated treatment well;No increased pain    Behavior During Therapy  WFL for tasks assessed/performed       Past Medical History:  Diagnosis Date  . ALLERGIC RHINITIS 11/30/2008  . ALLERGIC RHINITIS 11/30/2008  . ASTHMA UNSPECIFIED WITH EXACERBATION 02/17/2008  . CAROTID BRUIT, LEFT 04/19/2007  . Closed fracture of lateral malleolus 06/04/2009  . CLOSED FRACTURE OF METATARSAL BONE 06/04/2009  . Esophageal reflux 11/08/2008  . GANGLION CYST 04/19/2007  . HYPERLIPIDEMIA NEC/NOS 04/19/2007  . HYPERTENSION 04/07/2007  . INSOMNIA 08/07/2010  . OSTEOARTHRITIS 04/07/2007  . OSTEOPENIA 03/10/2008  . UTI 07/20/2009    Past Surgical History:  Procedure Laterality Date  . knuckle replacement    . TOTAL HIP ARTHROPLASTY    . TUBAL LIGATION      There were no vitals filed for this visit.  Subjective Assessment - 10/21/17 1438    Subjective  Pt states that she saw a specialist regarding her nose drippage/drainage. Pt states that she is still hurting and she has not really improved. Her symptoms come and go.    Patient Stated Goals  To improve hip pain and decrease falling    Currently in Pain?  Yes    Pain Score  1     Pain Location   Hip    Pain Orientation  Left;Lateral    Pain Descriptors / Indicators  Stabbing    Pain Type  Chronic pain    Pain Onset  More than a month ago    Pain Frequency  Constant    Aggravating Factors   initial standing up from chair, vacuum, housework    Pain Relieving Factors  nothing for hip, educated on trial of heat    Effect of Pain on Daily Activities  mod effect on ADL completion           [facility] PT Assessment - 10/21/17 0001      Strength   Right Hip Extension  4+/5 was 4    Right Hip ABduction  4+/5 was 4    Left Hip Flexion  5/5 was 4+    Left Hip Extension  4+/5 was 4    Left Hip ABduction  4+/5 was 4+           [facility] Adult PT Treatment/Exercise - 10/21/17 0001      Lumbar Exercises: Standing   Wall Slides  15 reps    Other Standing Lumbar Exercises  side stepping with GTB 1f x2RT    Other Standing Lumbar Exercises  mini squat x15 reps (chair behind for form) x15 reps;  fwd/lat step ups on 4" step x10 reps each      Lumbar Exercises: Seated   Sit to Stand  15 reps            PT Education - 10/21/17 1459    Education provided  Yes    Education Details  discharge plans, HEP    Person(s) Educated  Patient    Methods  Explanation;Demonstration;Handout    Comprehension  Verbalized understanding;Returned demonstration       PT Short Term Goals - 10/21/17 1442      PT SHORT TERM GOAL #1   Title  Pt will be independent with HEP to improve overall pain management and prevent reoccurring injury.     Baseline  2/6: has not done them    Time  2    Period  Weeks    Status  On-going      PT SHORT TERM GOAL #2   Title  Pt will demonstrate improved ROM without increased symptoms to improve functional mobility during ADLs.     Time  2    Period  Weeks    Status  Achieved      PT SHORT TERM GOAL #3   Title  Pt will be able to maintain desired posture alignment to decrease strain on lumbar spine to improve quality of life.     Baseline  2/6: no pain with  sitting and states that she doesn't really think about her posture; currently in correct posture    Time  2    Period  Weeks    Status  Achieved      PT SHORT TERM GOAL #4   Title  Pt will have increased hip strength by 1 MMT to improve stability and overall functional strength for ADLs.     Time  2    Period  Weeks    Status  Partially Met        PT Long Term Goals - 10/21/17 1444      PT LONG TERM GOAL #1   Title  Pt will have improved BERG balance score 53/56 to indicate decreased risk of falls.     Time  4    Period  Weeks    Status  Achieved      PT LONG TERM GOAL #2   Title  Pt will have decreased reports of falls to at most 1 over two week period.    Baseline  2/6: has not had falls since she started    Time  4    Period  Weeks    Status  Achieved      PT LONG TERM GOAL #3   Title  Pt will have improved hip strength by 2 MMT grades to improve stability during ambulation and functional ADLs.     Baseline  see MMT    Time  4    Period  Weeks    Status  On-going      PT LONG TERM GOAL #5   Title  Pt will demonstrate proper lifting mechanics to pick object off the floor at home to improve protection of lumbar spine during ADLs.    Baseline  2/6: no difficulty with that today; can vacuum at home but needs husband to help refill the water tank in her vacuum    Time  4    Period  Weeks    Status  Partially Met            Plan -  10/21/17 1509    Clinical Impression Statement  PT reassessed pt's goals and outcome measures this date. Pt has made progress towards goals as illustrated above. Pt verbalizing that she doesn't feel like therapy has helped and does not wish to continue as she is still having her pain. However, she does report that her balance has improved, which is "what she was coming here for." Pt also verbalizing that she has not been compliant with her HEP. She also states that she is going to have surgery in the near future for her nasal drainage; she  was told by a specialist that this was spinal fluid leaking as she had a tear from her last fall. Updated pt's HEP to include gluteal strengthening and functional strengthening to continue to address pain and deficits in MMT. Pt demo'ing understanding. Pt will be discharged to her HEP.     Rehab Potential  Good    PT Frequency  2x / week    PT Duration  4 weeks    PT Treatment/Interventions  ADLs/Self Care Home Management;Moist Heat;Balance training;Therapeutic exercise;Therapeutic activities;Functional mobility training;Stair training;Gait training;Neuromuscular re-education;Patient/family education;Manual techniques    PT Next Visit Plan  discharged    PT Home Exercise Plan  eval: bridge, quad stretch, tandem and SL balance, hamstring st; 2/6: see below for new additions    Consulted and Agree with Plan of Care  Patient       Patient will benefit from skilled therapeutic intervention in order to improve the following deficits and impairments:  Abnormal gait, Decreased knowledge of precautions, Decreased strength, Pain, Decreased balance, Difficulty walking, Increased muscle spasms, Postural dysfunction  Visit Diagnosis: Acute left-sided low back pain with left-sided sciatica  Decreased strength  Impaired functional mobility, balance, gait, and endurance  Posture abnormality     Problem List Patient Active Problem List   Diagnosis Date Noted  . Hypothyroidism, postop 11/19/2016  . Cyst in hand 08/23/2014  . Preventative health care 07/10/2014  . Attention deficit disorder 07/10/2014  . Obesity (BMI 30-39.9) 12/26/2013  . GERD (gastroesophageal reflux disease) 11/17/2012  . Depression with anxiety 10/28/2010  . INSOMNIA 08/07/2010  . ALLERGIC RHINITIS 11/30/2008  . OSTEOPENIA 03/10/2008  . Hyperlipidemia 04/19/2007  . Left carotid bruit 04/19/2007  . Essential hypertension 04/07/2007  . Osteoarthritis 04/07/2007     PHYSICAL THERAPY DISCHARGE SUMMARY  Visits from Start  of Care: 12  Current functional level related to goals / functional outcomes: See above   Remaining deficits: See above   Education / Equipment: HEP Plan: Patient agrees to discharge.  Patient goals were partially met. Patient is being discharged due to the patient's request.  ?????      Geraldine Solar PT, [place] [address] [place], [place], [address] Phone: [phone]   Fax:  [phone]  Name: ALLAYA ABBASI MRN: [id number] Date of Birth: 11/15/1940

## 2017-10-21 NOTE — Patient Instructions (Signed)
  lateral walk at pole or counter   Standing at a support surface (role or counter top) use hands to assist with balance. Perform sideways walking down and back until you feel that you need to take break.   It may be help to place a chair close to allow for safe rest breaks.    SIT TO STAND - NO SUPPORT  Start by scooting close to the front of the chair.  Next, lean forward at your trunk and reach forward with your arms and rise to standing without using your hands to push off from the chair or other object.   Use your arms as a counter-balance by reaching forward when in sitting and lower them as you approach standing.    Mini Squat at Counter  Standing with feet shoulder length apart, bend knees slightly and return to standing position. Don't let knees go over toes and keep hands lightly on counter top for support.   WALL SQUATS  Leaning up against a wall or closed door on your back, slide your body downward and then return back to upright position.  A door was used here because it was smoother and had less friction than the wall.   Knees should bend in line with the 2nd toe and not pass the front of the foot.   FRONT STEP-UPS  Step up onto stool or step with involved leg.  Step down leading with uninvolved leg.  Repeat.   LATERAL STEP-UPS  Stand on stool or step with involved leg.  Lower the uninvolved leg until the heel touches the floor, then press back up using the muscles in the involved leg only.  Repeat.   Perform at least every other day, 2-3 sets of 10-15 reps of each exercises

## 2017-10-22 DIAGNOSIS — M9905 Segmental and somatic dysfunction of pelvic region: Secondary | ICD-10-CM | POA: Diagnosis not present

## 2017-10-22 DIAGNOSIS — M5137 Other intervertebral disc degeneration, lumbosacral region: Secondary | ICD-10-CM | POA: Diagnosis not present

## 2017-10-27 DIAGNOSIS — M9905 Segmental and somatic dysfunction of pelvic region: Secondary | ICD-10-CM | POA: Diagnosis not present

## 2017-10-27 DIAGNOSIS — M5137 Other intervertebral disc degeneration, lumbosacral region: Secondary | ICD-10-CM | POA: Diagnosis not present

## 2017-10-28 ENCOUNTER — Ambulatory Visit (HOSPITAL_COMMUNITY)
Admission: RE | Admit: 2017-10-28 | Discharge: 2017-10-28 | Disposition: A | Discharge status: Home / Self Care | Payer: Medicare Other | Source: Ambulatory Visit | Attending: Otolaryngology | Admitting: Otolaryngology

## 2017-10-28 DIAGNOSIS — G9600 Cerebrospinal fluid leak, unspecified: Secondary | ICD-10-CM

## 2017-10-29 DIAGNOSIS — M9905 Segmental and somatic dysfunction of pelvic region: Secondary | ICD-10-CM | POA: Diagnosis not present

## 2017-10-29 DIAGNOSIS — M5137 Other intervertebral disc degeneration, lumbosacral region: Secondary | ICD-10-CM | POA: Diagnosis not present

## 2017-10-30 ENCOUNTER — Other Ambulatory Visit (INDEPENDENT_AMBULATORY_CARE_PROVIDER_SITE_OTHER): Payer: Self-pay | Admitting: Otolaryngology

## 2017-11-02 ENCOUNTER — Ambulatory Visit (INDEPENDENT_AMBULATORY_CARE_PROVIDER_SITE_OTHER): Payer: Medicare Other | Admitting: Endocrinology

## 2017-11-02 ENCOUNTER — Other Ambulatory Visit (INDEPENDENT_AMBULATORY_CARE_PROVIDER_SITE_OTHER): Payer: Self-pay | Admitting: Otolaryngology

## 2017-11-02 ENCOUNTER — Encounter: Payer: Self-pay | Admitting: Endocrinology

## 2017-11-02 VITALS — BP 120/76 | HR 83 | Temp 97.7°F | Resp 16 | Ht 63.0 in | Wt 184.0 lb

## 2017-11-02 DIAGNOSIS — E890 Postprocedural hypothyroidism: Secondary | ICD-10-CM | POA: Diagnosis not present

## 2017-11-02 DIAGNOSIS — Q019 Encephalocele, unspecified: Secondary | ICD-10-CM

## 2017-11-02 DIAGNOSIS — M5137 Other intervertebral disc degeneration, lumbosacral region: Secondary | ICD-10-CM | POA: Diagnosis not present

## 2017-11-02 DIAGNOSIS — M9905 Segmental and somatic dysfunction of pelvic region: Secondary | ICD-10-CM | POA: Diagnosis not present

## 2017-11-02 NOTE — Patient Instructions (Signed)
Liothyronine: 1 1/2 tabs in am daily

## 2017-11-02 NOTE — Progress Notes (Signed)
Patient ID: [name], female   DOB: [birth year], 77 y.o.   MRN: [id number]    Reason for Appointment:  Follow-up of thyroid     History of Present Illness:   Previous history: She had a low TSH level as far back as 7 years ago She had symptoms of feeling hot and sweaty with physical activity, some fatigue and palpitations She has had long history of anxiety and depression   Because of her symptoms and TSH in the hyperthyroid range as well as uptake of 34% she was given I-131 treatment with 30 mCi on 09/27/14. Subsequently she was subjectively better When TSH  was  as high as 15.9 she was started on 75 g levothyroxine in 12/2014  Recent history:  She had progressive increase in her levothyroxine dose since 2017 TSH was 5.4 in 5/17 and because of symptoms of fatigue her dose was increased to 100 g The dose was increased further in 11/17 and also in 2/18, was eventually taking 137 g  On her visit in 7/18 she was complaining of feeling consistently tired although has an occasional day where she feels better She also thinks she was losing hair, no cold intolerance   Since she had a relatively low  T3 level  she was switched from levothyroxine 137 to the Cytomel 5 g along with levothyroxine 112 g in July 2018  With her new regimen she initially felt less tired overall and had somewhat less hair loss  Overall she thinks she is benefiting from the combination of T4 and T3 compared to levothyroxine alone However she thinks she gets tired periodically and not sure what this is from Still having some difficulties with depression and sleep  She is still having some hair loss, not having cold intolerance  She is consistently compliant with taking her supplements before breakfast consistently in the morning  Even though her TSH is normal it is trending higher in her free T3 is again lower than usual, free T4 slightly lower also  Wt Readings from Last 3 Encounters:    11/02/17 184 lb (83.5 kg)  09/23/17 183 lb 9.6 oz (83.3 kg)  08/19/17 187 lb 3.2 oz (84.9 kg)     Lab Results  Component Value Date   TSH 3.22 10/14/2017   TSH 1.59 06/26/2017   TSH 0.36 (L) 04/10/2017   FREET4 1.0 10/14/2017   FREET4 1.3 06/26/2017   FREET4 1.8 04/10/2017    Lab Results  Component Value Date   T3FREE 2.7 10/14/2017   T3FREE 3.4 06/26/2017   T3FREE 2.8 04/10/2017   T3FREE 2.8 03/02/2017      Allergies as of 11/02/2017      Reactions   Cephalexin Other (See Comments)   REACTION IS UNKNOWN      Medication List        Accurate as of 11/02/17  3:05 PM. Always use your most recent med list.          amphetamine-dextroamphetamine 30 MG tablet Commonly known as:  ADDERALL TAKE 30 MG BY MOUTH IN AM AND 30 MG IN PM   amphetamine-dextroamphetamine 30 MG tablet Commonly known as:  ADDERALL Take 30 mg by mouth in the Am and 30 mg in pm   amphetamine-dextroamphetamine 30 MG tablet Commonly known as:  ADDERALL Take 1 tablet by mouth 2 (two) times daily. Fill in 2 months   aspirin-acetaminophen-caffeine 250-250-65 MG tablet Commonly known as:  EXCEDRIN MIGRAINE Take 1 tablet by mouth every 6 (  six) hours as needed.   B-12 PO Take 1 tablet by mouth daily. Reported on 01/21/2016   buPROPion 300 MG 24 hr tablet Commonly known as:  WELLBUTRIN XL TAKE ONE TABLET BY MOUTH ONCE DAILY   calcium carbonate 500 MG chewable tablet Commonly known as:  TUMS - dosed in mg elemental calcium Chew 1-2 tablets by mouth 2 (two) times daily.   conjugated estrogens vaginal cream Commonly known as:  PREMARIN Place 1 Applicatorful vaginally daily. Apply twice per week   diclofenac sodium 1 % Gel Commonly known as:  VOLTAREN APPLY   TOPICALLY TO AFFECTED AREA 4 TIMES DAILY AS NEEDED FOR PAIN   FLUoxetine 10 MG capsule Commonly known as:  PROZAC TAKE 3 CAPSULES(30 MG) BY MOUTH DAILY   fluticasone 50 MCG/ACT nasal spray Commonly known as:  FLONASE Place 1 spray  into both nostrils daily.   glucosamine-chondroitin 500-400 MG tablet Take 1 tablet by mouth 3 (three) times daily.   levothyroxine 112 MCG tablet Commonly known as:  SYNTHROID, LEVOTHROID TAKE 1 TABLET BY MOUTH ONCE DAILY BEFORE BREAKFAST   liothyronine 5 MCG tablet Commonly known as:  CYTOMEL TAKE 1 TABLET BY MOUTH ONCE DAILY   losartan-hydrochlorothiazide 100-25 MG tablet Commonly known as:  HYZAAR Take 1 tablet by mouth daily.   Melatonin 3 MG Tabs Take 3 mg by mouth at bedtime. Reported on 01/21/2016   multivitamin capsule Take 1 capsule by mouth daily.   ranitidine 150 MG tablet Commonly known as:  ZANTAC TAKE ONE TABLET BY MOUTH TWICE DAILY AS NEEDED FOR  HEARTBURN       Allergies:  Allergies  Allergen Reactions  . Cephalexin Other (See Comments)    REACTION IS UNKNOWN    Past Medical History:  Diagnosis Date  . ALLERGIC RHINITIS 11/30/2008  . ALLERGIC RHINITIS 11/30/2008  . ASTHMA UNSPECIFIED WITH EXACERBATION 02/17/2008  . CAROTID BRUIT, LEFT 04/19/2007  . Closed fracture of lateral malleolus 06/04/2009  . CLOSED FRACTURE OF METATARSAL BONE 06/04/2009  . Esophageal reflux 11/08/2008  . GANGLION CYST 04/19/2007  . HYPERLIPIDEMIA NEC/NOS 04/19/2007  . HYPERTENSION 04/07/2007  . INSOMNIA 08/07/2010  . OSTEOARTHRITIS 04/07/2007  . OSTEOPENIA 03/10/2008  . UTI 07/20/2009    Past Surgical History:  Procedure Laterality Date  . knuckle replacement    . TOTAL HIP ARTHROPLASTY    . TUBAL LIGATION      Family History  Problem Relation Age of Onset  . Heart disease Mother        Mother also has significant carotid artery stenosis  . COPD Father   . Alcoholism Father   . Cancer Brother   . Alzheimer's disease Sister   . Thyroid disease Sister        Goiter  . Breast cancer Neg Hx     Social History:  reports that she quit smoking about 49 years ago. Her smoking use included cigarettes. She has a 30.00 pack-year smoking history. she has never used smokeless  tobacco. She reports that she does not drink alcohol or use drugs.   Review of Systems:   ROS   Has complaints of late insomnia, Treated with melatonin OTC  She is on multiple treatment for depression/ADHD but she is  wanting to see a psychiatrist  Followed by PCP for hypertension  Blood pressure readings:   BP Readings from Last 3 Encounters:  11/02/17 120/76  09/23/17 120/70  08/19/17 136/78    Examination:   BP 120/76 (BP Location: Left Arm, Patient Position: Sitting,  Cuff Size: Small)   Pulse 83   Temp 97.7 F (36.5 C) (Oral)   Resp 16   Ht 5\' 3"  (1.6 m)   Wt 184 lb (83.5 kg)   SpO2 98%   BMI 32.59 kg/m    Thyroid not palpable Biceps reflexes appear normal   Assessment/Plan:    She has been on Levothyroxine supplementation for post ablative hypothyroidism, baseline TSH 16  Now with taking  112 g of levothyroxine and 5 g of Cytomel she is subjectively feeling better with less fatigue and better outlook TSH is normal and her free T3 level is improved No goiter on exam  She may be getting a little less hair loss also She does want to continue this regimen  She will follow-up in 4 months with repeat labs       total visit time 15 minutes  There are no Patient Instructions on file for this visit.    influenza vaccine given   Elayne Snare 11/02/2017             Patient ID: [name], female   DOB: 04/03/1941, 77 y.o.   MRN: [id number]    Reason for Appointment:  Follow-up of thyroid     History of Present Illness:   Previous history: She had a low TSH level as far back as 7 years ago She had symptoms of feeling hot and sweaty with physical activity, some fatigue and palpitations She has had long history of anxiety and depression   Because of her symptoms and TSH in the hyperthyroid range as well as uptake of 34% she was given I-131 treatment with 30 mCi on 09/27/14. Subsequently she was subjectively better When TSH  was  as high as  15.9 she was started on 75 g levothyroxine in 12/2014  Recent history:  She had progressive increase in her levothyroxine dose since 2017 TSH was 5.4 in 5/17 and because of symptoms of fatigue her dose was increased to 100 g The dose was increased further in 11/17 and also in 2/18, was eventually taking 137 g  On her visit in 7/18 she was complaining of feeling consistently tired although has an occasional day where she feels better She also thinks she is losing hair, no cold intolerance   Since she had a relatively low  T3 level  she was switched from levothyroxine 137 to the Cytomel 5 g along with levothyroxine 112 g in July She is very compliant with taking her supplements before breakfast consistently in the morning  With her new regimen she says that she does feel less tired overall and maybe her hair loss is not as much Overall she thinks she is benefiting from the combination compared to levothyroxine alone even though her TSH is now 1.6 compared to low normal levels previously  Wt Readings from Last 3 Encounters:  11/02/17 184 lb (83.5 kg)  09/23/17 183 lb 9.6 oz (83.3 kg)  08/19/17 187 lb 3.2 oz (84.9 kg)     Lab Results  Component Value Date   TSH 3.22 10/14/2017   TSH 1.59 06/26/2017   TSH 0.36 (L) 04/10/2017   FREET4 1.0 10/14/2017   FREET4 1.3 06/26/2017   FREET4 1.8 04/10/2017    Lab Results  Component Value Date   T3FREE 2.7 10/14/2017   T3FREE 3.4 06/26/2017   T3FREE 2.8 04/10/2017   T3FREE 2.8 03/02/2017      Allergies as of 11/02/2017      Reactions   Cephalexin  Other (See Comments)   REACTION IS UNKNOWN      Medication List        Accurate as of 11/02/17  3:05 PM. Always use your most recent med list.          amphetamine-dextroamphetamine 30 MG tablet Commonly known as:  ADDERALL TAKE 30 MG BY MOUTH IN AM AND 30 MG IN PM   amphetamine-dextroamphetamine 30 MG tablet Commonly known as:  ADDERALL Take 30 mg by mouth in the Am and 30  mg in pm   amphetamine-dextroamphetamine 30 MG tablet Commonly known as:  ADDERALL Take 1 tablet by mouth 2 (two) times daily. Fill in 2 months   aspirin-acetaminophen-caffeine 250-250-65 MG tablet Commonly known as:  EXCEDRIN MIGRAINE Take 1 tablet by mouth every 6 (six) hours as needed.   B-12 PO Take 1 tablet by mouth daily. Reported on 01/21/2016   buPROPion 300 MG 24 hr tablet Commonly known as:  WELLBUTRIN XL TAKE ONE TABLET BY MOUTH ONCE DAILY   calcium carbonate 500 MG chewable tablet Commonly known as:  TUMS - dosed in mg elemental calcium Chew 1-2 tablets by mouth 2 (two) times daily.   conjugated estrogens vaginal cream Commonly known as:  PREMARIN Place 1 Applicatorful vaginally daily. Apply twice per week   diclofenac sodium 1 % Gel Commonly known as:  VOLTAREN APPLY   TOPICALLY TO AFFECTED AREA 4 TIMES DAILY AS NEEDED FOR PAIN   FLUoxetine 10 MG capsule Commonly known as:  PROZAC TAKE 3 CAPSULES(30 MG) BY MOUTH DAILY   fluticasone 50 MCG/ACT nasal spray Commonly known as:  FLONASE Place 1 spray into both nostrils daily.   glucosamine-chondroitin 500-400 MG tablet Take 1 tablet by mouth 3 (three) times daily.   levothyroxine 112 MCG tablet Commonly known as:  SYNTHROID, LEVOTHROID TAKE 1 TABLET BY MOUTH ONCE DAILY BEFORE BREAKFAST   liothyronine 5 MCG tablet Commonly known as:  CYTOMEL TAKE 1 TABLET BY MOUTH ONCE DAILY   losartan-hydrochlorothiazide 100-25 MG tablet Commonly known as:  HYZAAR Take 1 tablet by mouth daily.   Melatonin 3 MG Tabs Take 3 mg by mouth at bedtime. Reported on 01/21/2016   multivitamin capsule Take 1 capsule by mouth daily.   ranitidine 150 MG tablet Commonly known as:  ZANTAC TAKE ONE TABLET BY MOUTH TWICE DAILY AS NEEDED FOR  HEARTBURN       Allergies:  Allergies  Allergen Reactions  . Cephalexin Other (See Comments)    REACTION IS UNKNOWN    Past Medical History:  Diagnosis Date  . ALLERGIC RHINITIS  11/30/2008  . ALLERGIC RHINITIS 11/30/2008  . ASTHMA UNSPECIFIED WITH EXACERBATION 02/17/2008  . CAROTID BRUIT, LEFT 04/19/2007  . Closed fracture of lateral malleolus 06/04/2009  . CLOSED FRACTURE OF METATARSAL BONE 06/04/2009  . Esophageal reflux 11/08/2008  . GANGLION CYST 04/19/2007  . HYPERLIPIDEMIA NEC/NOS 04/19/2007  . HYPERTENSION 04/07/2007  . INSOMNIA 08/07/2010  . OSTEOARTHRITIS 04/07/2007  . OSTEOPENIA 03/10/2008  . UTI 07/20/2009    Past Surgical History:  Procedure Laterality Date  . knuckle replacement    . TOTAL HIP ARTHROPLASTY    . TUBAL LIGATION      Family History  Problem Relation Age of Onset  . Heart disease Mother        Mother also has significant carotid artery stenosis  . COPD Father   . Alcoholism Father   . Cancer Brother   . Alzheimer's disease Sister   . Thyroid disease Sister  Goiter  . Breast cancer Neg Hx     Social History:  reports that she quit smoking about 49 years ago. Her smoking use included cigarettes. She has a 30.00 pack-year smoking history. she has never used smokeless tobacco. She reports that she does not drink alcohol or use drugs.   Review of Systems:   ROS   Following is a copy of the previous note:  Has complaints of late insomnia, Treated with melatonin OTC  She is on multiple treatment for depression/ADHD but she is  wanting to see a psychiatrist  Followed by PCP for hypertension  Blood pressure readings:   BP Readings from Last 3 Encounters:  11/02/17 120/76  09/23/17 120/70  08/19/17 136/78    Examination:   BP 120/76 (BP Location: Left Arm, Patient Position: Sitting, Cuff Size: Small)   Pulse 83   Temp 97.7 F (36.5 C) (Oral)   Resp 16   Ht 5\' 3"  (1.6 m)   Wt 184 lb (83.5 kg)   SpO2 98%   BMI 32.59 kg/m   She looks well No tremor Biceps reflexes appear normal No peripheral edema present  Assessment/Plan:    She has been on thyroid supplementation for post ablative hypothyroidism, baseline TSH  16  More recently has done electively better with adding Cytomel to her levothyroxine in 2018 However she continues to have nonspecific fatigue and hair loss as discussed above  Other labs were done about 3 weeks ago she does appear to have relatively lower T3 level even with continuing the 5 g of Cytomel that she is taking regularly in the morning Most likely she still has some element of depression and difficulty with sleep causing her intermittent fatigue  Discussed that not clear if her hypothyroidism is causing her symptoms since TSH is still normal at 3.2  Will empirically have her take another 2.5 g of liothyronine, she can do 1-1/2 tablets of Cytomel No change in levothyroxine 112 g daily  DEPRESSION: She is on multiple drugs and also Adderall with inadequate control, also followed by PCP  There are no Patient Instructions on file for this visit.    Elayne Snare 11/02/2017

## 2017-11-03 MED ORDER — LIOTHYRONINE SODIUM 5 MCG PO TABS: 7.5000 ug | ORAL_TABLET | Freq: Every day | ORAL | 3 refills | Status: DC

## 2017-11-04 DIAGNOSIS — M9905 Segmental and somatic dysfunction of pelvic region: Secondary | ICD-10-CM | POA: Diagnosis not present

## 2017-11-04 DIAGNOSIS — M5137 Other intervertebral disc degeneration, lumbosacral region: Secondary | ICD-10-CM | POA: Diagnosis not present

## 2017-11-05 ENCOUNTER — Ambulatory Visit (INDEPENDENT_AMBULATORY_CARE_PROVIDER_SITE_OTHER): Payer: Self-pay | Admitting: Otolaryngology

## 2017-11-05 DIAGNOSIS — M9905 Segmental and somatic dysfunction of pelvic region: Secondary | ICD-10-CM | POA: Diagnosis not present

## 2017-11-05 DIAGNOSIS — M5137 Other intervertebral disc degeneration, lumbosacral region: Secondary | ICD-10-CM | POA: Diagnosis not present

## 2017-11-09 ENCOUNTER — Other Ambulatory Visit (INDEPENDENT_AMBULATORY_CARE_PROVIDER_SITE_OTHER): Payer: Self-pay | Admitting: Otolaryngology

## 2017-11-09 ENCOUNTER — Ambulatory Visit (HOSPITAL_COMMUNITY)
Admission: RE | Admit: 2017-11-09 | Discharge: 2017-11-09 | Disposition: A | Discharge status: Home / Self Care | Payer: Medicare Other | Source: Ambulatory Visit | Attending: Otolaryngology | Admitting: Otolaryngology

## 2017-11-09 DIAGNOSIS — Q010 Frontal encephalocele: Secondary | ICD-10-CM | POA: Diagnosis not present

## 2017-11-09 DIAGNOSIS — J3489 Other specified disorders of nose and nasal sinuses: Secondary | ICD-10-CM | POA: Diagnosis not present

## 2017-11-09 DIAGNOSIS — Q019 Encephalocele, unspecified: Secondary | ICD-10-CM

## 2017-11-09 LAB — POCT I-STAT CREATININE: CREATININE: 1.1 mg/dL — AB (ref 0.44–1.00)

## 2017-11-09 MED ORDER — GADOBENATE DIMEGLUMINE 529 MG/ML IV SOLN
20.0000 mL | Freq: Once | INTRAVENOUS | Status: AC | PRN
  Administered 2017-11-09: 17 mL via INTRAVENOUS

## 2017-11-10 ENCOUNTER — Other Ambulatory Visit: Payer: Self-pay

## 2017-11-10 DIAGNOSIS — M5137 Other intervertebral disc degeneration, lumbosacral region: Secondary | ICD-10-CM | POA: Diagnosis not present

## 2017-11-10 DIAGNOSIS — M9905 Segmental and somatic dysfunction of pelvic region: Secondary | ICD-10-CM | POA: Diagnosis not present

## 2017-11-10 MED ORDER — LEVOTHYROXINE SODIUM 112 MCG PO TABS: ORAL_TABLET | ORAL | 1 refills | Status: DC

## 2017-11-11 DIAGNOSIS — M9905 Segmental and somatic dysfunction of pelvic region: Secondary | ICD-10-CM | POA: Diagnosis not present

## 2017-11-11 DIAGNOSIS — M5137 Other intervertebral disc degeneration, lumbosacral region: Secondary | ICD-10-CM | POA: Diagnosis not present

## 2017-11-12 ENCOUNTER — Ambulatory Visit (INDEPENDENT_AMBULATORY_CARE_PROVIDER_SITE_OTHER): Payer: Medicare Other | Admitting: Otolaryngology

## 2017-11-12 DIAGNOSIS — M9905 Segmental and somatic dysfunction of pelvic region: Secondary | ICD-10-CM | POA: Diagnosis not present

## 2017-11-12 DIAGNOSIS — C960 Multifocal and multisystemic (disseminated) Langerhans-cell histiocytosis: Secondary | ICD-10-CM

## 2017-11-12 DIAGNOSIS — M5137 Other intervertebral disc degeneration, lumbosacral region: Secondary | ICD-10-CM | POA: Diagnosis not present

## 2017-11-17 DIAGNOSIS — M9905 Segmental and somatic dysfunction of pelvic region: Secondary | ICD-10-CM | POA: Diagnosis not present

## 2017-11-17 DIAGNOSIS — M5137 Other intervertebral disc degeneration, lumbosacral region: Secondary | ICD-10-CM | POA: Diagnosis not present

## 2017-11-18 ENCOUNTER — Encounter: Payer: Self-pay | Admitting: Internal Medicine

## 2017-11-23 DIAGNOSIS — G960 Cerebrospinal fluid leak: Secondary | ICD-10-CM | POA: Diagnosis not present

## 2017-11-23 DIAGNOSIS — R0982 Postnasal drip: Secondary | ICD-10-CM | POA: Diagnosis not present

## 2017-12-01 ENCOUNTER — Encounter: Payer: Self-pay | Admitting: Internal Medicine

## 2017-12-07 MED ORDER — AMPHETAMINE-DEXTROAMPHETAMINE 30 MG PO TABS: ORAL_TABLET | ORAL | 0 refills | Status: DC

## 2017-12-07 MED ORDER — DICLOFENAC SODIUM 1 % TD GEL: TRANSDERMAL | 11 refills | Status: DC

## 2017-12-07 MED ORDER — AMPHETAMINE-DEXTROAMPHETAMINE 30 MG PO TABS: 30.0000 mg | ORAL_TABLET | Freq: Two times a day (BID) | ORAL | 0 refills | Status: DC

## 2018-01-13 HISTORY — PX: FUNCTIONAL ENDOSCOPIC SINUS SURGERY: SUR616

## 2018-01-19 DIAGNOSIS — F419 Anxiety disorder, unspecified: Secondary | ICD-10-CM | POA: Diagnosis not present

## 2018-01-19 DIAGNOSIS — Q018 Encephalocele of other sites: Secondary | ICD-10-CM | POA: Diagnosis not present

## 2018-01-19 DIAGNOSIS — F909 Attention-deficit hyperactivity disorder, unspecified type: Secondary | ICD-10-CM | POA: Diagnosis not present

## 2018-01-19 DIAGNOSIS — I10 Essential (primary) hypertension: Secondary | ICD-10-CM | POA: Diagnosis not present

## 2018-01-19 DIAGNOSIS — G960 Cerebrospinal fluid leak: Secondary | ICD-10-CM | POA: Diagnosis not present

## 2018-01-19 DIAGNOSIS — E039 Hypothyroidism, unspecified: Secondary | ICD-10-CM | POA: Diagnosis not present

## 2018-01-19 DIAGNOSIS — Z87728 Personal history of other specified (corrected) congenital malformations of nervous system and sense organs: Secondary | ICD-10-CM | POA: Diagnosis not present

## 2018-01-19 DIAGNOSIS — F329 Major depressive disorder, single episode, unspecified: Secondary | ICD-10-CM | POA: Diagnosis not present

## 2018-01-26 DIAGNOSIS — J0130 Acute sphenoidal sinusitis, unspecified: Secondary | ICD-10-CM | POA: Diagnosis not present

## 2018-01-26 DIAGNOSIS — I10 Essential (primary) hypertension: Secondary | ICD-10-CM | POA: Diagnosis present

## 2018-01-26 DIAGNOSIS — Q018 Encephalocele of other sites: Secondary | ICD-10-CM | POA: Diagnosis not present

## 2018-01-26 DIAGNOSIS — Z87728 Personal history of other specified (corrected) congenital malformations of nervous system and sense organs: Secondary | ICD-10-CM | POA: Diagnosis not present

## 2018-01-26 DIAGNOSIS — F329 Major depressive disorder, single episode, unspecified: Secondary | ICD-10-CM | POA: Diagnosis present

## 2018-01-26 DIAGNOSIS — G960 Cerebrospinal fluid leak: Secondary | ICD-10-CM | POA: Diagnosis present

## 2018-01-26 DIAGNOSIS — F909 Attention-deficit hyperactivity disorder, unspecified type: Secondary | ICD-10-CM | POA: Diagnosis present

## 2018-01-26 DIAGNOSIS — E039 Hypothyroidism, unspecified: Secondary | ICD-10-CM | POA: Diagnosis present

## 2018-01-26 DIAGNOSIS — G9389 Other specified disorders of brain: Secondary | ICD-10-CM | POA: Diagnosis not present

## 2018-01-26 DIAGNOSIS — F419 Anxiety disorder, unspecified: Secondary | ICD-10-CM | POA: Diagnosis present

## 2018-02-01 ENCOUNTER — Ambulatory Visit: Payer: Self-pay | Admitting: Endocrinology

## 2018-02-10 DIAGNOSIS — Z48810 Encounter for surgical aftercare following surgery on the sense organs: Secondary | ICD-10-CM | POA: Diagnosis not present

## 2018-02-15 DIAGNOSIS — I10 Essential (primary) hypertension: Secondary | ICD-10-CM | POA: Diagnosis not present

## 2018-02-15 DIAGNOSIS — Z6827 Body mass index (BMI) 27.0-27.9, adult: Secondary | ICD-10-CM | POA: Diagnosis not present

## 2018-02-15 DIAGNOSIS — R51 Headache: Secondary | ICD-10-CM | POA: Diagnosis not present

## 2018-02-15 DIAGNOSIS — J302 Other seasonal allergic rhinitis: Secondary | ICD-10-CM | POA: Diagnosis not present

## 2018-02-15 DIAGNOSIS — G960 Cerebrospinal fluid leak: Secondary | ICD-10-CM | POA: Diagnosis not present

## 2018-02-15 DIAGNOSIS — M8580 Other specified disorders of bone density and structure, unspecified site: Secondary | ICD-10-CM | POA: Diagnosis not present

## 2018-02-15 DIAGNOSIS — F909 Attention-deficit hyperactivity disorder, unspecified type: Secondary | ICD-10-CM | POA: Diagnosis not present

## 2018-02-15 DIAGNOSIS — M545 Low back pain: Secondary | ICD-10-CM | POA: Diagnosis not present

## 2018-02-15 DIAGNOSIS — E039 Hypothyroidism, unspecified: Secondary | ICD-10-CM | POA: Diagnosis not present

## 2018-02-15 DIAGNOSIS — F331 Major depressive disorder, recurrent, moderate: Secondary | ICD-10-CM | POA: Diagnosis not present

## 2018-02-17 DIAGNOSIS — E039 Hypothyroidism, unspecified: Secondary | ICD-10-CM | POA: Diagnosis not present

## 2018-02-18 ENCOUNTER — Encounter: Payer: Medicare Other | Admitting: Internal Medicine

## 2018-02-22 ENCOUNTER — Other Ambulatory Visit (HOSPITAL_COMMUNITY): Payer: Self-pay | Admitting: Internal Medicine

## 2018-02-22 DIAGNOSIS — Z780 Asymptomatic menopausal state: Secondary | ICD-10-CM

## 2018-02-22 DIAGNOSIS — Z1231 Encounter for screening mammogram for malignant neoplasm of breast: Secondary | ICD-10-CM

## 2018-02-25 ENCOUNTER — Other Ambulatory Visit: Payer: Self-pay | Admitting: Internal Medicine

## 2018-02-25 ENCOUNTER — Encounter: Payer: Self-pay | Admitting: Internal Medicine

## 2018-02-25 DIAGNOSIS — F418 Other specified anxiety disorders: Secondary | ICD-10-CM

## 2018-02-26 NOTE — Telephone Encounter (Signed)
Patient need to schedule an ov for more refills. 

## 2018-03-01 DIAGNOSIS — Z9889 Other specified postprocedural states: Secondary | ICD-10-CM | POA: Diagnosis not present

## 2018-03-01 DIAGNOSIS — Z48810 Encounter for surgical aftercare following surgery on the sense organs: Secondary | ICD-10-CM | POA: Diagnosis not present

## 2018-03-01 DIAGNOSIS — G960 Cerebrospinal fluid leak: Secondary | ICD-10-CM | POA: Diagnosis not present

## 2018-03-02 ENCOUNTER — Other Ambulatory Visit: Payer: Self-pay | Admitting: Internal Medicine

## 2018-03-02 DIAGNOSIS — Z780 Asymptomatic menopausal state: Secondary | ICD-10-CM

## 2018-03-04 ENCOUNTER — Other Ambulatory Visit (HOSPITAL_COMMUNITY): Payer: Self-pay

## 2018-03-04 ENCOUNTER — Ambulatory Visit (HOSPITAL_COMMUNITY)
Admission: RE | Admit: 2018-03-04 | Discharge: 2018-03-04 | Disposition: A | Discharge status: Home / Self Care | Payer: Medicare Other | Source: Ambulatory Visit | Attending: Internal Medicine | Admitting: Internal Medicine

## 2018-03-04 ENCOUNTER — Encounter (HOSPITAL_COMMUNITY): Payer: Self-pay | Admitting: Radiology

## 2018-03-04 ENCOUNTER — Ambulatory Visit (HOSPITAL_COMMUNITY): Payer: Self-pay

## 2018-03-04 DIAGNOSIS — Z1231 Encounter for screening mammogram for malignant neoplasm of breast: Secondary | ICD-10-CM | POA: Insufficient documentation

## 2018-03-04 DIAGNOSIS — Z780 Asymptomatic menopausal state: Secondary | ICD-10-CM | POA: Diagnosis not present

## 2018-03-04 DIAGNOSIS — M85851 Other specified disorders of bone density and structure, right thigh: Secondary | ICD-10-CM | POA: Diagnosis not present

## 2018-03-05 ENCOUNTER — Other Ambulatory Visit: Payer: Self-pay

## 2018-03-08 DIAGNOSIS — Z0000 Encounter for general adult medical examination without abnormal findings: Secondary | ICD-10-CM | POA: Diagnosis not present

## 2018-03-08 DIAGNOSIS — G960 Cerebrospinal fluid leak: Secondary | ICD-10-CM | POA: Diagnosis not present

## 2018-03-08 DIAGNOSIS — F909 Attention-deficit hyperactivity disorder, unspecified type: Secondary | ICD-10-CM | POA: Diagnosis not present

## 2018-03-08 DIAGNOSIS — F331 Major depressive disorder, recurrent, moderate: Secondary | ICD-10-CM | POA: Diagnosis not present

## 2018-03-08 DIAGNOSIS — M8580 Other specified disorders of bone density and structure, unspecified site: Secondary | ICD-10-CM | POA: Diagnosis not present

## 2018-03-08 DIAGNOSIS — E782 Mixed hyperlipidemia: Secondary | ICD-10-CM | POA: Diagnosis not present

## 2018-03-08 DIAGNOSIS — M545 Low back pain: Secondary | ICD-10-CM | POA: Diagnosis not present

## 2018-03-08 DIAGNOSIS — H538 Other visual disturbances: Secondary | ICD-10-CM | POA: Diagnosis not present

## 2018-03-08 DIAGNOSIS — I10 Essential (primary) hypertension: Secondary | ICD-10-CM | POA: Diagnosis not present

## 2018-03-08 DIAGNOSIS — E039 Hypothyroidism, unspecified: Secondary | ICD-10-CM | POA: Diagnosis not present

## 2018-03-09 ENCOUNTER — Encounter (INDEPENDENT_AMBULATORY_CARE_PROVIDER_SITE_OTHER): Payer: Self-pay | Admitting: *Deleted

## 2018-03-15 DIAGNOSIS — H04123 Dry eye syndrome of bilateral lacrimal glands: Secondary | ICD-10-CM | POA: Diagnosis not present

## 2018-03-16 DIAGNOSIS — Z6832 Body mass index (BMI) 32.0-32.9, adult: Secondary | ICD-10-CM | POA: Diagnosis not present

## 2018-03-16 DIAGNOSIS — M25512 Pain in left shoulder: Secondary | ICD-10-CM | POA: Diagnosis not present

## 2018-03-23 ENCOUNTER — Other Ambulatory Visit: Payer: Self-pay | Admitting: Internal Medicine

## 2018-03-30 ENCOUNTER — Ambulatory Visit: Payer: Self-pay | Admitting: Endocrinology

## 2018-04-12 DIAGNOSIS — Z09 Encounter for follow-up examination after completed treatment for conditions other than malignant neoplasm: Secondary | ICD-10-CM | POA: Diagnosis not present

## 2018-04-19 ENCOUNTER — Other Ambulatory Visit: Payer: Self-pay

## 2018-04-20 DIAGNOSIS — M8580 Other specified disorders of bone density and structure, unspecified site: Secondary | ICD-10-CM | POA: Diagnosis not present

## 2018-04-20 DIAGNOSIS — H04121 Dry eye syndrome of right lacrimal gland: Secondary | ICD-10-CM | POA: Diagnosis not present

## 2018-04-20 DIAGNOSIS — I10 Essential (primary) hypertension: Secondary | ICD-10-CM | POA: Diagnosis not present

## 2018-04-20 DIAGNOSIS — E039 Hypothyroidism, unspecified: Secondary | ICD-10-CM | POA: Diagnosis not present

## 2018-04-20 DIAGNOSIS — Z0000 Encounter for general adult medical examination without abnormal findings: Secondary | ICD-10-CM | POA: Diagnosis not present

## 2018-04-20 DIAGNOSIS — E782 Mixed hyperlipidemia: Secondary | ICD-10-CM | POA: Diagnosis not present

## 2018-04-23 ENCOUNTER — Other Ambulatory Visit: Payer: Self-pay

## 2018-04-23 ENCOUNTER — Emergency Department (HOSPITAL_COMMUNITY)
Admission: EM | Admit: 2018-04-23 | Discharge: 2018-04-23 | Disposition: A | Discharge status: Home / Self Care | Payer: Medicare Other | Attending: Emergency Medicine | Admitting: Emergency Medicine

## 2018-04-23 ENCOUNTER — Encounter (HOSPITAL_COMMUNITY): Payer: Self-pay | Admitting: Emergency Medicine

## 2018-04-23 DIAGNOSIS — I10 Essential (primary) hypertension: Secondary | ICD-10-CM | POA: Diagnosis not present

## 2018-04-23 DIAGNOSIS — E890 Postprocedural hypothyroidism: Secondary | ICD-10-CM | POA: Insufficient documentation

## 2018-04-23 DIAGNOSIS — X58XXXA Exposure to other specified factors, initial encounter: Secondary | ICD-10-CM | POA: Diagnosis not present

## 2018-04-23 DIAGNOSIS — Y92018 Other place in single-family (private) house as the place of occurrence of the external cause: Secondary | ICD-10-CM | POA: Insufficient documentation

## 2018-04-23 DIAGNOSIS — T1582XA Foreign body in other and multiple parts of external eye, left eye, initial encounter: Secondary | ICD-10-CM | POA: Insufficient documentation

## 2018-04-23 DIAGNOSIS — H02811 Retained foreign body in right upper eyelid: Secondary | ICD-10-CM | POA: Diagnosis not present

## 2018-04-23 DIAGNOSIS — Z79899 Other long term (current) drug therapy: Secondary | ICD-10-CM | POA: Insufficient documentation

## 2018-04-23 DIAGNOSIS — T1581XA Foreign body in other and multiple parts of external eye, right eye, initial encounter: Secondary | ICD-10-CM | POA: Insufficient documentation

## 2018-04-23 DIAGNOSIS — Y999 Unspecified external cause status: Secondary | ICD-10-CM | POA: Insufficient documentation

## 2018-04-23 DIAGNOSIS — J45909 Unspecified asthma, uncomplicated: Secondary | ICD-10-CM | POA: Insufficient documentation

## 2018-04-23 DIAGNOSIS — T1590XA Foreign body on external eye, part unspecified, unspecified eye, initial encounter: Secondary | ICD-10-CM

## 2018-04-23 DIAGNOSIS — Z87891 Personal history of nicotine dependence: Secondary | ICD-10-CM | POA: Diagnosis not present

## 2018-04-23 DIAGNOSIS — Y9389 Activity, other specified: Secondary | ICD-10-CM | POA: Diagnosis not present

## 2018-04-23 DIAGNOSIS — H02814 Retained foreign body in left upper eyelid: Secondary | ICD-10-CM | POA: Diagnosis not present

## 2018-04-23 MED ORDER — FLUORESCEIN SODIUM 1 MG OP STRP
2.0000 | ORAL_STRIP | Freq: Once | OPHTHALMIC | Status: AC
  Administered 2018-04-23: 2 via OPHTHALMIC
  Filled 2018-04-23: qty 2

## 2018-04-23 MED ORDER — BACITRACIN-POLYMYXIN B 500-10000 UNIT/GM OP OINT
TOPICAL_OINTMENT | Freq: Once | OPHTHALMIC | Status: AC
  Administered 2018-04-23: 15:00:00 via OPHTHALMIC
  Filled 2018-04-23: qty 3.5

## 2018-04-23 MED ORDER — TETRACAINE HCL 0.5 % OP SOLN
2.0000 [drp] | Freq: Once | OPHTHALMIC | Status: AC
  Administered 2018-04-23: 2 [drp] via OPHTHALMIC
  Filled 2018-04-23: qty 4

## 2018-04-23 NOTE — ED Notes (Signed)
Pharmacy notified for ophthalmic ointment.

## 2018-04-23 NOTE — ED Notes (Signed)
Bacitracin reapplied to eyelashes.

## 2018-04-23 NOTE — ED Triage Notes (Signed)
Patient states she has an eye infection and was given eye drops to use. States she picked up super glue instead of the eye drops but immediately realized it before the drops got into her eyeball. Patient has glue noted to eyelashes on bilateral eyes at triage.

## 2018-04-23 NOTE — Discharge Instructions (Addendum)
Warm compresses on and off to your eyes.  Continue using the ophthalmic ointment as directed.  Also use over-the-counter lubricating eyedrops 1 to 2 drops every 2 hours.  Apply a large amount of the ointment to your eyes at bedtime.  Follow-up with your ophthalmologist for recheck on Monday.  Return to the ER for any worsening symptoms.

## 2018-04-24 NOTE — ED Provider Notes (Signed)
[facility] EMERGENCY DEPARTMENT Provider Note   CSN: [id number] Arrival date & time: 04/23/18  1314     History   Chief Complaint Chief Complaint  Patient presents with  . Foreign Body in [place] is a 77 y.o. female.  HPI   [name] is a 77 y.o. female who presents to the Emergency Department requesting evaluation for accidental exposure of superglue to her eyelids.  She states she was seen by her PCP 1 day prior to ER arrival and treated with eyedrops for an infection to her eyes.  States that she attempted to put the eyedrops in her eye but accidentally picked up a bottle of superglue and put 1 drop in each eye.  Realizing what she done, she quickly used her fingers to pry her eye lids open.  She contacted poison control and was advised to come to the emergency room for further evaluation.  She has not tried to remove the superglue.  She denies visual changes, headache, burning or stinging of her eyes and pain to her face.  She states she normally wears corrective lenses but does not have them with her.   Past Medical History:  Diagnosis Date  . ALLERGIC RHINITIS 11/30/2008  . ALLERGIC RHINITIS 11/30/2008  . ASTHMA UNSPECIFIED WITH EXACERBATION 02/17/2008  . CAROTID BRUIT, LEFT 04/19/2007  . Closed fracture of lateral malleolus 06/04/2009  . CLOSED FRACTURE OF METATARSAL BONE 06/04/2009  . Esophageal reflux 11/08/2008  . GANGLION CYST 04/19/2007  . HYPERLIPIDEMIA NEC/NOS 04/19/2007  . HYPERTENSION 04/07/2007  . INSOMNIA 08/07/2010  . OSTEOARTHRITIS 04/07/2007  . OSTEOPENIA 03/10/2008  . UTI 07/20/2009    Patient Active Problem List   Diagnosis Date Noted  . Hypothyroidism, postop 11/19/2016  . Cyst in hand 08/23/2014  . Preventative health care 07/10/2014  . Attention deficit disorder 07/10/2014  . Obesity (BMI 30-39.9) 12/26/2013  . GERD (gastroesophageal reflux disease) 11/17/2012  . Depression with anxiety 10/28/2010  . INSOMNIA 08/07/2010  .  ALLERGIC RHINITIS 11/30/2008  . OSTEOPENIA 03/10/2008  . Hyperlipidemia 04/19/2007  . Left carotid bruit 04/19/2007  . Essential hypertension 04/07/2007  . Osteoarthritis 04/07/2007    Past Surgical History:  Procedure Laterality Date  . knuckle replacement    . TOTAL HIP ARTHROPLASTY    . TUBAL LIGATION       OB History   None      Home Medications    Prior to Admission medications   Medication Sig Start Date End Date Taking? Authorizing Provider  amphetamine-dextroamphetamine (ADDERALL) 30 MG tablet TAKE 30 MG BY MOUTH IN AM AND 30 MG IN PM 12/07/17   Marletta Lor, MD  amphetamine-dextroamphetamine (ADDERALL) 30 MG tablet Take 30 mg by mouth in the Am and 30 mg in pm 12/07/17   Marletta Lor, MD  amphetamine-dextroamphetamine (ADDERALL) 30 MG tablet Take 1 tablet by mouth 2 (two) times daily. Fill in 2 months 12/07/17   Marletta Lor, MD  aspirin-acetaminophen-caffeine [facility] MIGRAINE) [phone] MG per tablet Take 1 tablet by mouth every 6 (six) hours as needed. Patient taking differently: Take 1 tablet by mouth daily as needed for headache or migraine.  09/02/13   Ricard Dillon, MD  buPROPion (WELLBUTRIN XL) 300 MG 24 hr tablet TAKE 1 TABLET BY MOUTH ONCE DAILY 03/02/18   Marletta Lor, MD  calcium carbonate (TUMS - DOSED IN MG ELEMENTAL CALCIUM) 500 MG chewable tablet Chew 1-2 tablets by mouth 2 (two) times  daily.     [provider]  conjugated estrogens (PREMARIN) vaginal cream Place 1 Applicatorful vaginally daily. Apply twice per week 03/05/16   Marletta Lor, MD  Cyanocobalamin (B-12 PO) Take 1 tablet by mouth daily. Reported on 01/21/2016    [provider]  diclofenac sodium (VOLTAREN) 1 % GEL APPLY   TOPICALLY TO AFFECTED AREA 4 TIMES DAILY AS NEEDED FOR PAIN 12/07/17   Marletta Lor, MD  FLUoxetine (PROZAC) 10 MG capsule TAKE 3 CAPSULES(30 MG) BY MOUTH DAILY 04/29/17   Marletta Lor, MD  FLUoxetine  (PROZAC) 20 MG capsule TAKE ONE CAPSULE BY MOUTH ONCE DAILY 03/02/18   Marletta Lor, MD  fluticasone [facility]) 50 MCG/ACT nasal spray Place 1 spray into both nostrils daily. 09/23/17   Billie Ruddy, MD  glucosamine-chondroitin 500-400 MG tablet Take 1 tablet by mouth 3 (three) times daily.    [provider]  levothyroxine (SYNTHROID, LEVOTHROID) 112 MCG tablet TAKE 1 TABLET BY MOUTH ONCE DAILY BEFORE BREAKFAST 11/10/17   Elayne Snare, MD  liothyronine (CYTOMEL) 5 MCG tablet Take 1.5 tablets (7.5 mcg total) by mouth daily. 11/03/17   Elayne Snare, MD  losartan-hydrochlorothiazide (HYZAAR) 100-25 MG tablet Take 1 tablet by mouth daily. 04/10/17   Marletta Lor, MD  Melatonin 3 MG TABS Take 3 mg by mouth at bedtime. Reported on 01/21/2016    [provider]  Multiple Vitamin (MULTIVITAMIN) capsule Take 1 capsule by mouth daily.      [provider]  ranitidine (ZANTAC) 150 MG tablet TAKE 1 TABLET BY MOUTH TWICE DAILY AS NEEDED FOR HEARTBURN 03/24/18   Marletta Lor, MD    Family History Family History  Problem Relation Age of Onset  . Heart disease Mother        Mother also has significant carotid artery stenosis  . COPD Father   . Alcoholism Father   . Cancer Brother   . Alzheimer's disease Sister   . Thyroid disease Sister        Goiter  . Breast cancer Neg Hx     Social History Social History   Tobacco Use  . Smoking status: Former Smoker    Packs/day: 1.50    Years: 20.00    Pack years: 30.00    Types: Cigarettes    Last attempt to quit: 09/15/1968    Years since quitting: 49.6  . Smokeless tobacco: Never Used  . Tobacco comment: smoked 1.5 for  20 years  Substance Use Topics  . Alcohol use: No  . Drug use: No     Allergies   Cephalexin   Review of Systems Review of Systems  Constitutional: Negative for chills and fever.  HENT: Negative for sore throat and trouble swallowing.   Eyes: Negative for photophobia, pain and  visual disturbance.       Superglue to both upper eyelids.  Respiratory: Negative for shortness of breath.   Cardiovascular: Negative for chest pain.  Skin: Negative for wound.  Neurological: Negative for dizziness and headaches.     Physical Exam Updated Vital Signs BP (!) 153/76 (BP Location: Right Arm)   Pulse (!) 111   Temp 98.4 F (36.9 C) (Oral)   Resp 16   Ht 5\' 4"  (1.626 m)   Wt 79.4 kg   SpO2 100%   BMI 30.04 kg/m   Physical Exam  Constitutional: She appears well-developed. No distress.  HENT:  Head: Atraumatic.  Mouth/Throat: Oropharynx is clear and moist.  Eyes:  Pupils are equal, round, and reactive to light. EOM are normal. Lids are everted and swept, no foreign bodies found. Right conjunctiva is injected. Left conjunctiva is injected. Right eye exhibits normal extraocular motion. Left eye exhibits normal extraocular motion.  Slit lamp exam:      The right eye shows no corneal abrasion, no corneal ulcer and no fluorescein uptake.       The left eye shows no corneal abrasion, no corneal ulcer and no fluorescein uptake.    Focal areas of dried superglue to both of her upper eyelashes.  No edema or periorbital erythema.  Mild conjunctival injection.  No  Chemosis  Neck: Normal range of motion.  Cardiovascular: Normal rate and regular rhythm.  Pulmonary/Chest: Effort normal. No respiratory distress.  Musculoskeletal: Normal range of motion.  Neurological: She is alert. No sensory deficit.  Skin: Skin is warm. Capillary refill takes less than 2 seconds.  Nursing note and vitals reviewed.    ED Treatments / Results  Labs (all labs ordered are listed, but only abnormal results are displayed) Labs Reviewed - No data to display  EKG None  Radiology No results found.  Procedures Procedures (including critical care time)  Medications Ordered in ED Medications  fluorescein ophthalmic strip 2 strip (2 strips Both Eyes Given by Other 04/23/18 1508)    tetracaine (PONTOCAINE) 0.5 % ophthalmic solution 2 drop (2 drops Right Eye Given by Other 04/23/18 1508)  bacitracin-polymyxin b (POLYSPORIN) ophthalmic ointment ( Both Eyes Given 04/23/18 1507)     Initial Impression / Assessment and Plan / ED Course  I have reviewed the triage vital signs and the nursing notes.  Pertinent labs & imaging results that were available during my care of the patient were reviewed by me and considered in my medical decision making (see chart for details).        Visual Acuity  Right Eye Distance: 20/100 Left Eye Distance: 20/100 Bilateral Distance: 20/100(Patient does not have her glasses.)    Both eyes were irrigated with saline.  Large amounts of Polysporin ophthalmic ointment applied and eyelashes gently massaged.  Glue removed.  No corneal abrasions.  patient reports feeling better and ready for discharge home.  Ophthalmic ointment dispensed for continued home use.  Patient agrees to warm compresses and close follow-up with her ophthalmologist.  Return precautions discussed.  Final Clinical Impressions(s) / ED Diagnoses   Final diagnoses:  Foreign body in eye, unspecified laterality, initial encounter    ED Discharge Orders    None       Kem Parkinson, PA-C 04/24/18 1809    Nat Christen, MD 04/25/18 [phone]

## 2018-04-28 DIAGNOSIS — H2513 Age-related nuclear cataract, bilateral: Secondary | ICD-10-CM | POA: Diagnosis not present

## 2018-04-28 DIAGNOSIS — H04123 Dry eye syndrome of bilateral lacrimal glands: Secondary | ICD-10-CM | POA: Diagnosis not present

## 2018-05-18 ENCOUNTER — Other Ambulatory Visit: Payer: Self-pay | Admitting: Internal Medicine

## 2018-05-24 DIAGNOSIS — F331 Major depressive disorder, recurrent, moderate: Secondary | ICD-10-CM | POA: Diagnosis not present

## 2018-05-24 DIAGNOSIS — F909 Attention-deficit hyperactivity disorder, unspecified type: Secondary | ICD-10-CM | POA: Diagnosis not present

## 2018-05-24 DIAGNOSIS — Z6827 Body mass index (BMI) 27.0-27.9, adult: Secondary | ICD-10-CM | POA: Diagnosis not present

## 2018-05-24 DIAGNOSIS — J029 Acute pharyngitis, unspecified: Secondary | ICD-10-CM | POA: Diagnosis not present

## 2018-05-24 DIAGNOSIS — I10 Essential (primary) hypertension: Secondary | ICD-10-CM | POA: Diagnosis not present

## 2018-05-31 DIAGNOSIS — H04123 Dry eye syndrome of bilateral lacrimal glands: Secondary | ICD-10-CM | POA: Diagnosis not present

## 2018-06-18 ENCOUNTER — Other Ambulatory Visit: Payer: Self-pay

## 2018-06-18 ENCOUNTER — Telehealth: Payer: Self-pay | Admitting: Endocrinology

## 2018-06-18 DIAGNOSIS — E890 Postprocedural hypothyroidism: Secondary | ICD-10-CM

## 2018-06-18 NOTE — Telephone Encounter (Signed)
Pt called to schedule a fu with Dr.Kumar. Pt scheduled for 10/8. Pt stated that she need paperwork sent over to [facility] to do her lab work prior to her appt with Dr.Kumar

## 2018-06-18 NOTE — Telephone Encounter (Signed)
Faxed to [employer], [phone]. Call back number at that location in [place]. Pt is aware

## 2018-06-21 DIAGNOSIS — E890 Postprocedural hypothyroidism: Secondary | ICD-10-CM | POA: Diagnosis not present

## 2018-06-22 ENCOUNTER — Ambulatory Visit (INDEPENDENT_AMBULATORY_CARE_PROVIDER_SITE_OTHER): Payer: Medicare Other | Admitting: Endocrinology

## 2018-06-22 ENCOUNTER — Encounter: Payer: Self-pay | Admitting: Endocrinology

## 2018-06-22 VITALS — BP 136/84 | HR 80 | Ht 64.5 in | Wt 175.0 lb

## 2018-06-22 DIAGNOSIS — Z23 Encounter for immunization: Secondary | ICD-10-CM

## 2018-06-22 DIAGNOSIS — E890 Postprocedural hypothyroidism: Secondary | ICD-10-CM

## 2018-06-22 LAB — T3, FREE: T3, Free: 2.9 pg/mL (ref 2.3–4.2)

## 2018-06-22 LAB — T4, FREE: Free T4: 1.5 ng/dL (ref 0.8–1.8)

## 2018-06-22 LAB — TSH: TSH: 1.05 mIU/L (ref 0.40–4.50)

## 2018-06-22 NOTE — Progress Notes (Signed)
Patient ID: [name], female   DOB: 02-04-1941, 78 y.o.   MRN: [id number]    Reason for Appointment:  Follow-up of thyroid     History of Present Illness:   Previous history: She had a low TSH level as far back as 7 years ago She had symptoms of feeling hot and sweaty with physical activity, some fatigue and palpitations She has had long history of anxiety and depression   Because of her symptoms and TSH in the hyperthyroid range as well as uptake of 34% she was given I-131 treatment with 30 mCi on 09/27/14. Subsequently she was subjectively better When TSH  was  as high as 15.9 she was started on 75 g levothyroxine in 12/2014  Recent history:  She had progressive increase in her levothyroxine dose since 2017 The dose was increased in 11/17 and also in 2/18, was eventually taking 137 g On her visit in 7/18 she was complaining of feeling consistently tired although has an occasional day where she feels better She also thinks she was losing hair, no cold intolerance   Since she had a relatively low  T3 level  she was switched from levothyroxine 137 to Cytomel 5 g along with levothyroxine 112 g in July 2018  With her new combination regimen she initially felt less tired overall and had somewhat less hair loss However subsequently was still complaining of fatigue and issues with depression Has difficulty with sleep  Since her TSH was slightly higher than before and free T3 low normal she is now taking 7.5 mcg of Cytomel since 2/19 She has not come back for follow-up since then  However she does feels significantly better and has lost 10 pounds since her last visit May have some difficulty with sleep but otherwise doing better with other changes in medications and lifestyle She does say that she has difficulty cutting the Cytomel because even with a pill cutter and will fall apart   Wt Readings from Last 3 Encounters:  06/22/18 175 lb (79.4 kg)  04/23/18 175 lb  (79.4 kg)  11/02/17 184 lb (83.5 kg)     Lab Results  Component Value Date   TSH 1.05 06/21/2018   TSH 3.22 10/14/2017   TSH 1.59 06/26/2017   FREET4 1.5 06/21/2018   FREET4 1.0 10/14/2017   FREET4 1.3 06/26/2017    Lab Results  Component Value Date   T3FREE 2.9 06/21/2018   T3FREE 2.7 10/14/2017   T3FREE 3.4 06/26/2017   T3FREE 2.8 04/10/2017      Allergies as of 06/22/2018      Reactions   Cephalexin Other (See Comments)   REACTION IS UNKNOWN      Medication List        Accurate as of 06/22/18  2:00 PM. Always use your most recent med list.          amphetamine-dextroamphetamine 30 MG tablet Commonly known as:  ADDERALL Take 1 tablet by mouth 2 (two) times daily. Fill in 2 months   aspirin-acetaminophen-caffeine 250-250-65 MG tablet Commonly known as:  EXCEDRIN MIGRAINE Take 1 tablet by mouth every 6 (six) hours as needed.   B-12 PO Take 1 tablet by mouth daily. Reported on 01/21/2016   buPROPion 300 MG 24 hr tablet Commonly known as:  WELLBUTRIN XL TAKE 1 TABLET BY MOUTH ONCE DAILY   calcium carbonate 500 MG chewable tablet Commonly known as:  TUMS - dosed in mg elemental calcium Chew 1-2 tablets by mouth 2 (  two) times daily.   conjugated estrogens vaginal cream Commonly known as:  PREMARIN Place 1 Applicatorful vaginally daily. Apply twice per week   diclofenac sodium 1 % Gel Commonly known as:  VOLTAREN APPLY   TOPICALLY TO AFFECTED AREA 4 TIMES DAILY AS NEEDED FOR PAIN   FLUoxetine 20 MG capsule Commonly known as:  PROZAC TAKE ONE CAPSULE BY MOUTH ONCE DAILY   glucosamine-chondroitin 500-400 MG tablet Take 1 tablet by mouth 3 (three) times daily.   levothyroxine 112 MCG tablet Commonly known as:  SYNTHROID, LEVOTHROID TAKE 1 TABLET BY MOUTH ONCE DAILY BEFORE BREAKFAST   liothyronine 5 MCG tablet Commonly known as:  CYTOMEL Take 1.5 tablets (7.5 mcg total) by mouth daily.   losartan-hydrochlorothiazide 100-25 MG tablet Commonly known  as:  HYZAAR Take 1 tablet by mouth daily.   Melatonin 3 MG Tabs Take 3 mg by mouth at bedtime. Reported on 01/21/2016   multivitamin capsule Take 1 capsule by mouth daily.   ranitidine 150 MG tablet Commonly known as:  ZANTAC TAKE 1 TABLET BY MOUTH TWICE DAILY AS NEEDED FOR HEARTBURN       Allergies:  Allergies  Allergen Reactions  . Cephalexin Other (See Comments)    REACTION IS UNKNOWN    Past Medical History:  Diagnosis Date  . ALLERGIC RHINITIS 11/30/2008  . ALLERGIC RHINITIS 11/30/2008  . ASTHMA UNSPECIFIED WITH EXACERBATION 02/17/2008  . CAROTID BRUIT, LEFT 04/19/2007  . Closed fracture of lateral malleolus 06/04/2009  . CLOSED FRACTURE OF METATARSAL BONE 06/04/2009  . Esophageal reflux 11/08/2008  . GANGLION CYST 04/19/2007  . HYPERLIPIDEMIA NEC/NOS 04/19/2007  . HYPERTENSION 04/07/2007  . INSOMNIA 08/07/2010  . OSTEOARTHRITIS 04/07/2007  . OSTEOPENIA 03/10/2008  . UTI 07/20/2009    Past Surgical History:  Procedure Laterality Date  . knuckle replacement    . TOTAL HIP ARTHROPLASTY    . TUBAL LIGATION      Family History  Problem Relation Age of Onset  . Heart disease Mother        Mother also has significant carotid artery stenosis  . COPD Father   . Alcoholism Father   . Cancer Brother   . Alzheimer's disease Sister   . Thyroid disease Sister        Goiter  . Breast cancer Neg Hx     Social History:  reports that she quit smoking about 49 years ago. Her smoking use included cigarettes. She has a 30.00 pack-year smoking history. She has never used smokeless tobacco. She reports that she does not drink alcohol or use drugs.   Review of Systems:   ROS   Has complaints of late insomnia, better  She is on multiple treatment for depression/ADHD   Followed by PCP for hypertension  Blood pressure readings:   BP Readings from Last 3 Encounters:  06/22/18 136/84  04/23/18 (!) 153/76  11/02/17 120/76    Examination:   BP 136/84   Pulse 80   Ht 5' 4.5"  (1.638 m)   Wt 175 lb (79.4 kg)   SpO2 95%   BMI 29.57 kg/m    Thyroid not palpable Biceps reflexes appear normal   Assessment/Plan:    She has been on Levothyroxine supplementation for post ablative hypothyroidism, baseline TSH 16  Now with taking  112 g of levothyroxine and 5 g of Cytomel she is subjectively feeling better with less fatigue and better outlook TSH is normal and her free T3 level is improved No goiter on exam  She may  be getting a little less hair loss also She does want to continue this regimen  She will follow-up in 4 months with repeat labs       total visit time 15 minutes  There are no Patient Instructions on file for this visit.    influenza vaccine given   Elayne Snare 06/22/2018             Patient ID: [name], female   DOB: 06-23-1941, 77 y.o.   MRN: [id number]    Reason for Appointment:  Follow-up of thyroid     History of Present Illness:   Previous history: She had a low TSH level as far back as 7 years ago She had symptoms of feeling hot and sweaty with physical activity, some fatigue and palpitations She has had long history of anxiety and depression   Because of her symptoms and TSH in the hyperthyroid range as well as uptake of 34% she was given I-131 treatment with 30 mCi on 09/27/14. Subsequently she was subjectively better When TSH  was  as high as 15.9 she was started on 75 g levothyroxine in 12/2014  Recent history:  She had progressive increase in her levothyroxine dose since 2017 TSH was 5.4 in 5/17 and because of symptoms of fatigue her dose was increased to 100 g The dose was increased further in 11/17 and also in 2/18, was eventually taking 137 g  On her visit in 7/18 she was complaining of feeling consistently tired although has an occasional day where she feels better She also thinks she is losing hair, no cold intolerance   Since she had a relatively low  T3 level  she was switched from levothyroxine  137 to the Cytomel 5 g along with levothyroxine 112 g in July She is very compliant with taking her supplements before breakfast consistently in the morning  With her new regimen she says that she does feel less tired overall and maybe her hair loss is not as much Overall she thinks she is benefiting from the combination compared to levothyroxine alone even though her TSH is now 1.6 compared to low normal levels previously  Wt Readings from Last 3 Encounters:  06/22/18 175 lb (79.4 kg)  04/23/18 175 lb (79.4 kg)  11/02/17 184 lb (83.5 kg)     Lab Results  Component Value Date   TSH 1.05 06/21/2018   TSH 3.22 10/14/2017   TSH 1.59 06/26/2017   FREET4 1.5 06/21/2018   FREET4 1.0 10/14/2017   FREET4 1.3 06/26/2017    Lab Results  Component Value Date   T3FREE 2.9 06/21/2018   T3FREE 2.7 10/14/2017   T3FREE 3.4 06/26/2017   T3FREE 2.8 04/10/2017      Allergies as of 06/22/2018      Reactions   Cephalexin Other (See Comments)   REACTION IS UNKNOWN      Medication List        Accurate as of 06/22/18  2:00 PM. Always use your most recent med list.          amphetamine-dextroamphetamine 30 MG tablet Commonly known as:  ADDERALL Take 1 tablet by mouth 2 (two) times daily. Fill in 2 months   aspirin-acetaminophen-caffeine 250-250-65 MG tablet Commonly known as:  EXCEDRIN MIGRAINE Take 1 tablet by mouth every 6 (six) hours as needed.   B-12 PO Take 1 tablet by mouth daily. Reported on 01/21/2016   buPROPion 300 MG 24 hr tablet Commonly known as:  WELLBUTRIN XL TAKE  1 TABLET BY MOUTH ONCE DAILY   calcium carbonate 500 MG chewable tablet Commonly known as:  TUMS - dosed in mg elemental calcium Chew 1-2 tablets by mouth 2 (two) times daily.   conjugated estrogens vaginal cream Commonly known as:  PREMARIN Place 1 Applicatorful vaginally daily. Apply twice per week   diclofenac sodium 1 % Gel Commonly known as:  VOLTAREN APPLY   TOPICALLY TO AFFECTED AREA 4  TIMES DAILY AS NEEDED FOR PAIN   FLUoxetine 20 MG capsule Commonly known as:  PROZAC TAKE ONE CAPSULE BY MOUTH ONCE DAILY   glucosamine-chondroitin 500-400 MG tablet Take 1 tablet by mouth 3 (three) times daily.   levothyroxine 112 MCG tablet Commonly known as:  SYNTHROID, LEVOTHROID TAKE 1 TABLET BY MOUTH ONCE DAILY BEFORE BREAKFAST   liothyronine 5 MCG tablet Commonly known as:  CYTOMEL Take 1.5 tablets (7.5 mcg total) by mouth daily.   losartan-hydrochlorothiazide 100-25 MG tablet Commonly known as:  HYZAAR Take 1 tablet by mouth daily.   Melatonin 3 MG Tabs Take 3 mg by mouth at bedtime. Reported on 01/21/2016   multivitamin capsule Take 1 capsule by mouth daily.   ranitidine 150 MG tablet Commonly known as:  ZANTAC TAKE 1 TABLET BY MOUTH TWICE DAILY AS NEEDED FOR HEARTBURN       Allergies:  Allergies  Allergen Reactions  . Cephalexin Other (See Comments)    REACTION IS UNKNOWN    Past Medical History:  Diagnosis Date  . ALLERGIC RHINITIS 11/30/2008  . ALLERGIC RHINITIS 11/30/2008  . ASTHMA UNSPECIFIED WITH EXACERBATION 02/17/2008  . CAROTID BRUIT, LEFT 04/19/2007  . Closed fracture of lateral malleolus 06/04/2009  . CLOSED FRACTURE OF METATARSAL BONE 06/04/2009  . Esophageal reflux 11/08/2008  . GANGLION CYST 04/19/2007  . HYPERLIPIDEMIA NEC/NOS 04/19/2007  . HYPERTENSION 04/07/2007  . INSOMNIA 08/07/2010  . OSTEOARTHRITIS 04/07/2007  . OSTEOPENIA 03/10/2008  . UTI 07/20/2009    Past Surgical History:  Procedure Laterality Date  . knuckle replacement    . TOTAL HIP ARTHROPLASTY    . TUBAL LIGATION      Family History  Problem Relation Age of Onset  . Heart disease Mother        Mother also has significant carotid artery stenosis  . COPD Father   . Alcoholism Father   . Cancer Brother   . Alzheimer's disease Sister   . Thyroid disease Sister        Goiter  . Breast cancer Neg Hx     Social History:  reports that she quit smoking about 49 years ago. Her  smoking use included cigarettes. She has a 30.00 pack-year smoking history. She has never used smokeless tobacco. She reports that she does not drink alcohol or use drugs.   Review of Systems:   ROS    Has complaints of late insomnia, better recently  She is on multiple treatment for depression/ADHD   Followed by PCP for hypertension  Blood pressure readings:   BP Readings from Last 3 Encounters:  06/22/18 136/84  04/23/18 (!) 153/76  11/02/17 120/76    Examination:   BP 136/84   Pulse 80   Ht 5' 4.5" (1.638 m)   Wt 175 lb (79.4 kg)   SpO2 95%   BMI 29.57 kg/m   Thyroid exam normal Biceps reflexes show normal relaxation Skin appears normal   Assessment/Plan:    She has ablative hypothyroidism, baseline TSH 16  More recently has significant improvement in her fatigue but this is  probably related to other issues being resolved including depression She has lost weight She is having normal thyroid functions with slight improvement in T3 and TSH compared to the last time  Since she is having difficulty cutting the Cytomel in half she can take 1 tablet one day and 2 the next day Continue same dose of levothyroxine 112 mcg and take both tablets together in the morning before breakfast  DEPRESSION: She is doing subjectively better with adjusting her medications  With improved depression and increased activity she is able to lose weight  There are no Patient Instructions on file for this visit.  Flu vaccine given  Elayne Snare 06/22/2018

## 2018-06-30 DIAGNOSIS — H2512 Age-related nuclear cataract, left eye: Secondary | ICD-10-CM | POA: Diagnosis not present

## 2018-06-30 DIAGNOSIS — Z01818 Encounter for other preprocedural examination: Secondary | ICD-10-CM | POA: Diagnosis not present

## 2018-07-16 ENCOUNTER — Telehealth: Payer: Self-pay | Admitting: Endocrinology

## 2018-07-16 NOTE — Telephone Encounter (Signed)
Per [facility] "Caller states that she has body aches or the past week. Caller states that she doesn't have a thyroid." "Caller states body aches for a week, no fever, no swelling. Not sleeping well. Any new or worsen symptoms? Yes"

## 2018-07-19 NOTE — Telephone Encounter (Signed)
Pt informed

## 2018-07-19 NOTE — Telephone Encounter (Signed)
Please advise 

## 2018-07-19 NOTE — Telephone Encounter (Signed)
Attempted to contact pt to review her symptoms, someone answered but did not speak tried twice

## 2018-07-19 NOTE — Telephone Encounter (Signed)
No energy, not sleeping well she cannot stay asleep, tired, dry mouth and eyes. She is wondering if thyroid could have anything to do with dry eyes.

## 2018-07-19 NOTE — Telephone Encounter (Signed)
Unlikely that she has symptoms related to the thyroid and needs to first see her PCP

## 2018-07-21 DIAGNOSIS — M19012 Primary osteoarthritis, left shoulder: Secondary | ICD-10-CM | POA: Diagnosis not present

## 2018-07-21 DIAGNOSIS — M25512 Pain in left shoulder: Secondary | ICD-10-CM | POA: Diagnosis not present

## 2018-07-21 DIAGNOSIS — M25511 Pain in right shoulder: Secondary | ICD-10-CM | POA: Diagnosis not present

## 2018-07-21 DIAGNOSIS — M19111 Post-traumatic osteoarthritis, right shoulder: Secondary | ICD-10-CM | POA: Diagnosis not present

## 2018-08-02 DIAGNOSIS — H2512 Age-related nuclear cataract, left eye: Secondary | ICD-10-CM | POA: Diagnosis not present

## 2018-08-02 DIAGNOSIS — H25812 Combined forms of age-related cataract, left eye: Secondary | ICD-10-CM | POA: Diagnosis not present

## 2018-08-18 DIAGNOSIS — H2511 Age-related nuclear cataract, right eye: Secondary | ICD-10-CM | POA: Diagnosis not present

## 2018-08-18 DIAGNOSIS — H25811 Combined forms of age-related cataract, right eye: Secondary | ICD-10-CM | POA: Diagnosis not present

## 2018-09-01 NOTE — Pre-Procedure Instructions (Signed)
KRYSTN DERMODY  09/01/2018      [facility], [place] - [address] [place] [address] Phone: [phone] Fax: [phone]  [facility] Delivery - [place], [place] [address] Phone: [phone] Fax: [phone]  [facility] [phone] - [place], [place] [address] AT [address] [place] [address] Phone: [phone] Fax: [phone]    Your procedure is scheduled on December 26th.  Report to [facility] Admitting at [place].M.  Call this number if you have problems the morning of surgery:  [phone]   Remember:  Do not eat or drink after midnight.    Take these medicines the morning of surgery with A SIP OF WATER  buPROPion (WELLBUTRIN XL) FLUoxetine (PROZAC) levothyroxine (SYNTHROID, LEVOTHROID) liothyronine (CYTOMEL) ranitidine (ZANTAC)  If needed  7 days prior to surgery STOP taking any diclofenac sodium (VOLTAREN) GEL, Aspirin(unless otherwise instructed by your surgeon), Aleve, Naproxen, Ibuprofen, Motrin, Advil, Goody's, BC's, all herbal medications, fish oil, and all vitamins     Do not wear jewelry, make-up or nail polish.  Do not wear lotions, powders, or perfumes, or deodorant.  Do not shave 48 hours prior to surgery.  Men may shave face and neck.  Do not bring valuables to the hospital.  [facility] is not responsible for any belongings or valuables.  Contacts, dentures or bridgework may not be worn into surgery.  Leave your suitcase in the car.  After surgery it may be brought to your room.  For patients admitted to the hospital, discharge time will be determined by your treatment team.  Patients discharged the day of surgery will not be allowed to drive home.    [place]- Preparing For Surgery  Before surgery, you can play an important role. Because skin is not  sterile, your skin needs to be as free of germs as possible. You can reduce the number of germs on your skin by washing with CHG (chlorahexidine gluconate) Soap before surgery.  CHG is an antiseptic cleaner which kills germs and bonds with the skin to continue killing germs even after washing.    Oral Hygiene is also important to reduce your risk of infection.  Remember - BRUSH YOUR TEETH THE MORNING OF SURGERY WITH YOUR REGULAR TOOTHPASTE  Please do not use if you have an allergy to CHG or antibacterial soaps. If your skin becomes reddened/irritated stop using the CHG.  Do not shave (including legs and underarms) for at least 48 hours prior to first CHG shower. It is OK to shave your face.  Please follow these instructions carefully.   1. Shower the NIGHT BEFORE SURGERY and the MORNING OF SURGERY with CHG.   2. If you chose to wash your hair, wash your hair first as usual with your normal shampoo.  3. After you shampoo, rinse your hair and body thoroughly to remove the shampoo.  4. Use CHG as you would any other liquid soap. You can apply CHG directly to the skin and wash gently with a scrungie or a clean washcloth.   5. Apply the CHG Soap to your body ONLY FROM THE NECK DOWN.  Do not use on open wounds or open sores. Avoid contact with your eyes, ears, mouth and genitals (private parts). Wash Face and genitals (private parts)  with your normal  soap.  6. Wash thoroughly, paying special attention to the area where your surgery will be performed.  7. Thoroughly rinse your body with warm water from the neck down.  8. DO NOT shower/wash with your normal soap after using and rinsing off the CHG Soap.  9. Pat yourself dry with a CLEAN TOWEL.  10. Wear CLEAN PAJAMAS to bed the night before surgery, wear comfortable clothes the morning of surgery  11. Place CLEAN SHEETS on your bed the night of your first shower and DO NOT SLEEP WITH PETS.    Day of Surgery:  Do not apply any  deodorants/lotions.  Please wear clean clothes to the hospital/surgery center.   Remember to brush your teeth WITH YOUR REGULAR TOOTHPASTE.    Please read over the following fact sheets that you were given.

## 2018-09-02 ENCOUNTER — Other Ambulatory Visit: Payer: Self-pay

## 2018-09-02 ENCOUNTER — Encounter (HOSPITAL_COMMUNITY): Payer: Self-pay

## 2018-09-02 ENCOUNTER — Encounter (HOSPITAL_COMMUNITY)
Admission: RE | Admit: 2018-09-02 | Discharge: 2018-09-02 | Disposition: A | Discharge status: Home / Self Care | Payer: Medicare Other | Source: Ambulatory Visit | Attending: Orthopedic Surgery | Admitting: Orthopedic Surgery

## 2018-09-02 DIAGNOSIS — M19012 Primary osteoarthritis, left shoulder: Secondary | ICD-10-CM | POA: Diagnosis not present

## 2018-09-02 DIAGNOSIS — Z01818 Encounter for other preprocedural examination: Secondary | ICD-10-CM | POA: Insufficient documentation

## 2018-09-02 HISTORY — DX: Depression, unspecified: F32.A

## 2018-09-02 HISTORY — DX: Hypothyroidism, unspecified: E03.9

## 2018-09-02 HISTORY — DX: Major depressive disorder, single episode, unspecified: F32.9

## 2018-09-02 HISTORY — DX: Other specified postprocedural states: Z98.890

## 2018-09-02 HISTORY — DX: Anxiety disorder, unspecified: F41.9

## 2018-09-02 HISTORY — DX: Other specified postprocedural states: R11.2

## 2018-09-02 LAB — BASIC METABOLIC PANEL
ANION GAP: 8 (ref 5–15)
BUN: 19 mg/dL (ref 8–23)
CHLORIDE: 101 mmol/L (ref 98–111)
CO2: 31 mmol/L (ref 22–32)
Calcium: 10.3 mg/dL (ref 8.9–10.3)
Creatinine, Ser: 1 mg/dL (ref 0.44–1.00)
GFR calc Af Amer: >60 mL/min (ref >60)
GFR calc non Af Amer: 54 mL/min — ABNORMAL LOW (ref >60)
Glucose, Bld: 102 mg/dL — ABNORMAL HIGH (ref 70–99)
Potassium: 3.6 mmol/L (ref 3.5–5.1)
Sodium: 140 mmol/L (ref 135–145)

## 2018-09-02 LAB — CBC
HCT: 42.7 % (ref 36.0–46.0)
Hemoglobin: 13.1 g/dL (ref 12.0–15.0)
MCH: 26.8 pg (ref 26.0–34.0)
MCHC: 30.7 g/dL (ref 30.0–36.0)
MCV: 87.5 fL (ref 80.0–100.0)
NRBC: 0 % (ref 0.0–0.2)
Platelets: 424 10*3/uL — ABNORMAL HIGH (ref 150–400)
RBC: 4.88 MIL/uL (ref 3.87–5.11)
RDW: 15.3 % (ref 11.5–15.5)
WBC: 9.9 10*3/uL (ref 4.0–10.5)

## 2018-09-02 LAB — SURGICAL PCR SCREEN
MRSA, PCR: NEGATIVE
Staphylococcus aureus: POSITIVE — AB

## 2018-09-02 NOTE — Progress Notes (Addendum)
PCP  Allyn Kenner  MD   Pt. Denies any cardiac problems Denies any cardiac testing.

## 2018-09-02 NOTE — Progress Notes (Signed)
Surgical PCR +MSSA. Mupirocin called into [facility] in [place]. Left message for patient to pick up and begin using Mupirocin asap.

## 2018-09-02 NOTE — Pre-Procedure Instructions (Signed)
[name]  09/02/2018      [facility], [place] - [id number] [place] [address] Phone: [phone] Fax: [phone]  [facility] Delivery - [place], [place] [address] Phone: [phone] Fax: [phone]  [facility] DRUG STORE [id number] - [place], [place] [address] AT [address] [place] [address] Phone: [phone] Fax: [phone]    Your procedure is scheduled on December 26th. Thursday   Report to [name] Admitting at [place].M.   Call this number if you have problems the morning of surgery:  [phone]   Remember:  Do not eat food or drink liquids after midnight.    Take these medicines the morning of surgery with A SIP OF WATER   buPROPion (WELLBUTRIN XL) FLUoxetine (PROZAC) levothyroxine (SYNTHROID, LEVOTHROID) liothyronine (CYTOMEL) ranitidine (ZANTAC)  If needed  7 days prior to surgery STOP taking any diclofenac sodium (VOLTAREN) GEL, Aspirin(unless otherwise instructed by your surgeon), Aleve, Naproxen, Ibuprofen, Motrin, Advil, Goody's, BC's, all herbal medications, fish oil, and all vitamins     Do not wear jewelry, make-up or nail polish.  Do not wear lotions, powders, or perfumes, or deodorant.  Do not shave 48 hours prior to surgery.  Men may shave face and neck.  Do not bring valuables to the hospital.  [facility] is not responsible for any belongings or valuables.  Contacts, dentures or bridgework may not be worn into surgery.  Leave your suitcase in the car.  After surgery it may be brought to your room.  For patients admitted to the hospital, discharge time will be determined by your treatment team.  Patients discharged the day of surgery will not be allowed to drive home.    [place]- Preparing For Surgery  Before surgery, you can play an important  role. Because skin is not sterile, your skin needs to be as free of germs as possible. You can reduce the number of germs on your skin by washing with CHG (chlorahexidine gluconate) Soap before surgery.  CHG is an antiseptic cleaner which kills germs and bonds with the skin to continue killing germs even after washing.    Oral Hygiene is also important to reduce your risk of infection.  Remember - BRUSH YOUR TEETH THE MORNING OF SURGERY WITH YOUR REGULAR TOOTHPASTE  Please do not use if you have an allergy to CHG or antibacterial soaps. If your skin becomes reddened/irritated stop using the CHG.  Do not shave (including legs and underarms) for at least 48 hours prior to first CHG shower. It is OK to shave your face.  Please follow these instructions carefully.   1. Shower the NIGHT BEFORE SURGERY and the MORNING OF SURGERY with CHG.   2. If you chose to wash your hair, wash your hair first as usual with your normal shampoo.  3. After you shampoo, rinse your hair and body thoroughly to remove the shampoo.  4. Use CHG as you would any other liquid soap. You can apply CHG directly to the skin and wash gently with a scrungie or a clean washcloth.   5. Apply the CHG Soap to your body ONLY FROM THE NECK DOWN.  Do not use on open wounds or open sores. Avoid contact with your eyes, ears, mouth and genitals (private parts). Wash Face and genitals (  private parts)  with your normal soap.  6. Wash thoroughly, paying special attention to the area where your surgery will be performed.  7. Thoroughly rinse your body with warm water from the neck down.  8. DO NOT shower/wash with your normal soap after using and rinsing off the CHG Soap.  9. Pat yourself dry with a CLEAN TOWEL.  10. Wear CLEAN PAJAMAS to bed the night before surgery, wear comfortable clothes the morning of surgery  11. Place CLEAN SHEETS on your bed the night of your first shower and DO NOT SLEEP WITH PETS.    Day of Surgery:  Do  not apply any deodorants/lotions.  Please wear clean clothes to the hospital/surgery center.   Remember to brush your teeth WITH YOUR REGULAR TOOTHPASTE.    Please read over the following fact sheets that you were given.

## 2018-09-07 MED ORDER — TRANEXAMIC ACID-NACL 1000-0.7 MG/100ML-% IV SOLN
1000.0000 mg | INTRAVENOUS | Status: AC
  Administered 2018-09-09: 1000 mg via INTRAVENOUS

## 2018-09-09 ENCOUNTER — Other Ambulatory Visit: Payer: Self-pay

## 2018-09-09 ENCOUNTER — Inpatient Hospital Stay (HOSPITAL_COMMUNITY): Payer: Medicare Other | Admitting: Certified Registered"

## 2018-09-09 ENCOUNTER — Encounter (HOSPITAL_COMMUNITY)
Admission: RE | Disposition: A | Discharge status: Home / Self Care | Payer: Self-pay | Source: Home / Self Care | Attending: Orthopedic Surgery

## 2018-09-09 ENCOUNTER — Encounter (HOSPITAL_COMMUNITY): Payer: Self-pay | Admitting: Urology

## 2018-09-09 ENCOUNTER — Inpatient Hospital Stay (HOSPITAL_COMMUNITY)
Admission: RE | Admit: 2018-09-09 | Discharge: 2018-09-10 | DRG: 483 | Disposition: A | Discharge status: Home / Self Care | Payer: Medicare Other | Attending: Orthopedic Surgery | Admitting: Orthopedic Surgery

## 2018-09-09 DIAGNOSIS — Z8349 Family history of other endocrine, nutritional and metabolic diseases: Secondary | ICD-10-CM

## 2018-09-09 DIAGNOSIS — M25712 Osteophyte, left shoulder: Secondary | ICD-10-CM | POA: Diagnosis present

## 2018-09-09 DIAGNOSIS — Z96649 Presence of unspecified artificial hip joint: Secondary | ICD-10-CM | POA: Diagnosis present

## 2018-09-09 DIAGNOSIS — Z87891 Personal history of nicotine dependence: Secondary | ICD-10-CM

## 2018-09-09 DIAGNOSIS — K219 Gastro-esophageal reflux disease without esophagitis: Secondary | ICD-10-CM | POA: Diagnosis not present

## 2018-09-09 DIAGNOSIS — Z9841 Cataract extraction status, right eye: Secondary | ICD-10-CM | POA: Diagnosis not present

## 2018-09-09 DIAGNOSIS — Z96612 Presence of left artificial shoulder joint: Secondary | ICD-10-CM

## 2018-09-09 DIAGNOSIS — Z9851 Tubal ligation status: Secondary | ICD-10-CM | POA: Diagnosis not present

## 2018-09-09 DIAGNOSIS — Z79899 Other long term (current) drug therapy: Secondary | ICD-10-CM | POA: Diagnosis not present

## 2018-09-09 DIAGNOSIS — Z825 Family history of asthma and other chronic lower respiratory diseases: Secondary | ICD-10-CM

## 2018-09-09 DIAGNOSIS — E785 Hyperlipidemia, unspecified: Secondary | ICD-10-CM | POA: Diagnosis not present

## 2018-09-09 DIAGNOSIS — Z9842 Cataract extraction status, left eye: Secondary | ICD-10-CM

## 2018-09-09 DIAGNOSIS — F329 Major depressive disorder, single episode, unspecified: Secondary | ICD-10-CM | POA: Diagnosis present

## 2018-09-09 DIAGNOSIS — F419 Anxiety disorder, unspecified: Secondary | ICD-10-CM | POA: Diagnosis present

## 2018-09-09 DIAGNOSIS — M19012 Primary osteoarthritis, left shoulder: Secondary | ICD-10-CM | POA: Diagnosis not present

## 2018-09-09 DIAGNOSIS — Z811 Family history of alcohol abuse and dependence: Secondary | ICD-10-CM | POA: Diagnosis not present

## 2018-09-09 DIAGNOSIS — M75102 Unspecified rotator cuff tear or rupture of left shoulder, not specified as traumatic: Secondary | ICD-10-CM | POA: Diagnosis present

## 2018-09-09 DIAGNOSIS — Z79890 Hormone replacement therapy (postmenopausal): Secondary | ICD-10-CM

## 2018-09-09 DIAGNOSIS — Z8249 Family history of ischemic heart disease and other diseases of the circulatory system: Secondary | ICD-10-CM

## 2018-09-09 DIAGNOSIS — I10 Essential (primary) hypertension: Secondary | ICD-10-CM | POA: Diagnosis not present

## 2018-09-09 DIAGNOSIS — Z820 Family history of epilepsy and other diseases of the nervous system: Secondary | ICD-10-CM

## 2018-09-09 DIAGNOSIS — G8918 Other acute postprocedural pain: Secondary | ICD-10-CM | POA: Diagnosis not present

## 2018-09-09 DIAGNOSIS — Z791 Long term (current) use of non-steroidal anti-inflammatories (NSAID): Secondary | ICD-10-CM | POA: Diagnosis not present

## 2018-09-09 DIAGNOSIS — E039 Hypothyroidism, unspecified: Secondary | ICD-10-CM | POA: Diagnosis not present

## 2018-09-09 DIAGNOSIS — M8580 Other specified disorders of bone density and structure, unspecified site: Secondary | ICD-10-CM | POA: Diagnosis present

## 2018-09-09 HISTORY — PX: REVERSE SHOULDER ARTHROPLASTY: SHX5054

## 2018-09-09 SURGERY — ARTHROPLASTY, SHOULDER, TOTAL, REVERSE
Anesthesia: General | Site: Shoulder | Laterality: Left

## 2018-09-09 MED ORDER — HYDROMORPHONE HCL 1 MG/ML IJ SOLN: 0.5000 mg | INTRAMUSCULAR | Status: DC | PRN

## 2018-09-09 MED ORDER — LOSARTAN POTASSIUM-HCTZ 100-25 MG PO TABS: 1.0000 | ORAL_TABLET | Freq: Every day | ORAL | Status: DC

## 2018-09-09 MED ORDER — LOSARTAN POTASSIUM 50 MG PO TABS
100.0000 mg | ORAL_TABLET | Freq: Every day | ORAL | Status: DC
  Filled 2018-09-09: qty 2

## 2018-09-09 MED ORDER — LIOTHYRONINE SODIUM 5 MCG PO TABS
7.5000 ug | ORAL_TABLET | Freq: Every day | ORAL | Status: DC
  Administered 2018-09-10: 7.5 ug via ORAL
  Filled 2018-09-09 (×2): qty 2

## 2018-09-09 MED ORDER — OXYCODONE HCL 5 MG PO TABS: 10.0000 mg | ORAL_TABLET | ORAL | Status: DC | PRN

## 2018-09-09 MED ORDER — PROPOFOL 10 MG/ML IV BOLUS
INTRAVENOUS | Status: AC
  Filled 2018-09-09: qty 20

## 2018-09-09 MED ORDER — MIDAZOLAM HCL 2 MG/2ML IJ SOLN
INTRAMUSCULAR | Status: AC
  Filled 2018-09-09: qty 2

## 2018-09-09 MED ORDER — CEFAZOLIN SODIUM-DEXTROSE 2-3 GM-%(50ML) IV SOLR
INTRAVENOUS | Status: DC | PRN
  Administered 2018-09-09: 2 g via INTRAVENOUS

## 2018-09-09 MED ORDER — BISACODYL 5 MG PO TBEC: 5.0000 mg | DELAYED_RELEASE_TABLET | Freq: Every day | ORAL | Status: DC | PRN

## 2018-09-09 MED ORDER — MIDAZOLAM HCL 5 MG/5ML IJ SOLN
INTRAMUSCULAR | Status: DC | PRN
  Administered 2018-09-09: 1 mg via INTRAVENOUS

## 2018-09-09 MED ORDER — ALUM & MAG HYDROXIDE-SIMETH 200-200-20 MG/5ML PO SUSP
30.0000 mL | ORAL | Status: DC | PRN
  Administered 2018-09-09 (×2): 30 mL via ORAL
  Filled 2018-09-09 (×2): qty 30

## 2018-09-09 MED ORDER — OXYCODONE HCL 5 MG PO TABS
5.0000 mg | ORAL_TABLET | ORAL | Status: DC | PRN
  Administered 2018-09-10: 5 mg via ORAL
  Filled 2018-09-09: qty 1

## 2018-09-09 MED ORDER — MENTHOL 3 MG MT LOZG
1.0000 | LOZENGE | OROMUCOSAL | Status: DC | PRN
  Filled 2018-09-09 (×2): qty 9

## 2018-09-09 MED ORDER — PROPOFOL 10 MG/ML IV BOLUS
INTRAVENOUS | Status: DC | PRN
  Administered 2018-09-09: 150 mg via INTRAVENOUS

## 2018-09-09 MED ORDER — PHENOL 1.4 % MT LIQD
1.0000 | OROMUCOSAL | Status: DC | PRN
  Administered 2018-09-10: 1 via OROMUCOSAL
  Filled 2018-09-09: qty 177

## 2018-09-09 MED ORDER — LACTATED RINGERS IV SOLN
INTRAVENOUS | Status: DC
  Administered 2018-09-09: 06:00:00 via INTRAVENOUS

## 2018-09-09 MED ORDER — VANCOMYCIN HCL IN DEXTROSE 1-5 GM/200ML-% IV SOLN
INTRAVENOUS | Status: AC
  Administered 2018-09-09: 1000 mg via INTRAVENOUS
  Filled 2018-09-09: qty 200

## 2018-09-09 MED ORDER — METHOCARBAMOL 500 MG PO TABS: 500.0000 mg | ORAL_TABLET | Freq: Four times a day (QID) | ORAL | Status: DC | PRN

## 2018-09-09 MED ORDER — SODIUM CHLORIDE 0.9 % IV SOLN
INTRAVENOUS | Status: DC | PRN
  Administered 2018-09-09: 40 ug/min via INTRAVENOUS

## 2018-09-09 MED ORDER — CHLORHEXIDINE GLUCONATE 4 % EX LIQD: 60.0000 mL | Freq: Once | CUTANEOUS | Status: DC

## 2018-09-09 MED ORDER — FENTANYL CITRATE (PF) 250 MCG/5ML IJ SOLN
INTRAMUSCULAR | Status: AC
  Filled 2018-09-09: qty 5

## 2018-09-09 MED ORDER — METHOCARBAMOL 1000 MG/10ML IJ SOLN
500.0000 mg | Freq: Four times a day (QID) | INTRAVENOUS | Status: DC | PRN
  Filled 2018-09-09: qty 5

## 2018-09-09 MED ORDER — KETOROLAC TROMETHAMINE 15 MG/ML IJ SOLN
7.5000 mg | Freq: Four times a day (QID) | INTRAMUSCULAR | Status: AC
  Administered 2018-09-09 – 2018-09-10 (×4): 7.5 mg via INTRAVENOUS
  Filled 2018-09-09 (×4): qty 1

## 2018-09-09 MED ORDER — DIPHENHYDRAMINE HCL 12.5 MG/5ML PO ELIX: 12.5000 mg | ORAL_SOLUTION | ORAL | Status: DC | PRN

## 2018-09-09 MED ORDER — FLUOXETINE HCL 20 MG PO CAPS
20.0000 mg | ORAL_CAPSULE | Freq: Every day | ORAL | Status: DC
  Filled 2018-09-09: qty 1

## 2018-09-09 MED ORDER — 0.9 % SODIUM CHLORIDE (POUR BTL) OPTIME
TOPICAL | Status: DC | PRN
  Administered 2018-09-09: 1000 mL

## 2018-09-09 MED ORDER — LACTATED RINGERS IV SOLN
INTRAVENOUS | Status: DC
  Administered 2018-09-09 (×2): via INTRAVENOUS

## 2018-09-09 MED ORDER — LIDOCAINE 2% (20 MG/ML) 5 ML SYRINGE
INTRAMUSCULAR | Status: DC | PRN
  Administered 2018-09-09: 50 mg via INTRAVENOUS

## 2018-09-09 MED ORDER — TRAMADOL HCL 50 MG PO TABS: 50.0000 mg | ORAL_TABLET | Freq: Four times a day (QID) | ORAL | Status: DC | PRN

## 2018-09-09 MED ORDER — BUPROPION HCL ER (XL) 150 MG PO TB24
300.0000 mg | ORAL_TABLET | Freq: Every day | ORAL | Status: DC
  Filled 2018-09-09: qty 2

## 2018-09-09 MED ORDER — POLYETHYLENE GLYCOL 3350 17 G PO PACK: 17.0000 g | PACK | Freq: Every day | ORAL | Status: DC | PRN

## 2018-09-09 MED ORDER — SUGAMMADEX SODIUM 200 MG/2ML IV SOLN
INTRAVENOUS | Status: DC | PRN
  Administered 2018-09-09: 300 mg via INTRAVENOUS

## 2018-09-09 MED ORDER — TRANEXAMIC ACID-NACL 1000-0.7 MG/100ML-% IV SOLN
INTRAVENOUS | Status: AC
  Filled 2018-09-09: qty 100

## 2018-09-09 MED ORDER — LEVOTHYROXINE SODIUM 112 MCG PO TABS
112.0000 ug | ORAL_TABLET | Freq: Every day | ORAL | Status: DC
  Administered 2018-09-10: 112 ug via ORAL
  Filled 2018-09-09: qty 1

## 2018-09-09 MED ORDER — LIDOCAINE 2% (20 MG/ML) 5 ML SYRINGE
INTRAMUSCULAR | Status: AC
  Filled 2018-09-09: qty 5

## 2018-09-09 MED ORDER — BUPIVACAINE LIPOSOME 1.3 % IJ SUSP
INTRAMUSCULAR | Status: DC | PRN
  Administered 2018-09-09: 10 mL via PERINEURAL

## 2018-09-09 MED ORDER — ONDANSETRON HCL 4 MG PO TABS: 4.0000 mg | ORAL_TABLET | Freq: Four times a day (QID) | ORAL | Status: DC | PRN

## 2018-09-09 MED ORDER — FENTANYL CITRATE (PF) 100 MCG/2ML IJ SOLN
INTRAMUSCULAR | Status: DC | PRN
  Administered 2018-09-09 (×2): 50 ug via INTRAVENOUS

## 2018-09-09 MED ORDER — METOCLOPRAMIDE HCL 5 MG/ML IJ SOLN: 5.0000 mg | Freq: Three times a day (TID) | INTRAMUSCULAR | Status: DC | PRN

## 2018-09-09 MED ORDER — MAGNESIUM CITRATE PO SOLN: 1.0000 | Freq: Once | ORAL | Status: DC | PRN

## 2018-09-09 MED ORDER — METOCLOPRAMIDE HCL 5 MG PO TABS: 5.0000 mg | ORAL_TABLET | Freq: Three times a day (TID) | ORAL | Status: DC | PRN

## 2018-09-09 MED ORDER — ROCURONIUM BROMIDE 100 MG/10ML IV SOLN
INTRAVENOUS | Status: DC | PRN
  Administered 2018-09-09: 100 mg via INTRAVENOUS

## 2018-09-09 MED ORDER — TEMAZEPAM 15 MG PO CAPS: 15.0000 mg | ORAL_CAPSULE | Freq: Every evening | ORAL | Status: DC | PRN

## 2018-09-09 MED ORDER — ONDANSETRON HCL 4 MG/2ML IJ SOLN: 4.0000 mg | Freq: Four times a day (QID) | INTRAMUSCULAR | Status: DC | PRN

## 2018-09-09 MED ORDER — EPHEDRINE SULFATE-NACL 50-0.9 MG/10ML-% IV SOSY
PREFILLED_SYRINGE | INTRAVENOUS | Status: DC | PRN
  Administered 2018-09-09 (×5): 10 mg via INTRAVENOUS

## 2018-09-09 MED ORDER — HYDROCHLOROTHIAZIDE 25 MG PO TABS
25.0000 mg | ORAL_TABLET | Freq: Every day | ORAL | Status: DC
  Filled 2018-09-09: qty 1

## 2018-09-09 MED ORDER — ACETAMINOPHEN 325 MG PO TABS: 325.0000 mg | ORAL_TABLET | Freq: Four times a day (QID) | ORAL | Status: DC | PRN

## 2018-09-09 MED ORDER — VANCOMYCIN HCL IN DEXTROSE 1-5 GM/200ML-% IV SOLN
1000.0000 mg | INTRAVENOUS | Status: AC
  Administered 2018-09-09: 1000 mg via INTRAVENOUS

## 2018-09-09 MED ORDER — DEXAMETHASONE SODIUM PHOSPHATE 10 MG/ML IJ SOLN
INTRAMUSCULAR | Status: DC | PRN
  Administered 2018-09-09: 10 mg via INTRAVENOUS

## 2018-09-09 MED ORDER — PHENYLEPHRINE 40 MCG/ML (10ML) SYRINGE FOR IV PUSH (FOR BLOOD PRESSURE SUPPORT)
PREFILLED_SYRINGE | INTRAVENOUS | Status: DC | PRN
  Administered 2018-09-09 (×2): 80 ug via INTRAVENOUS

## 2018-09-09 MED ORDER — BUPIVACAINE HCL (PF) 0.5 % IJ SOLN
INTRAMUSCULAR | Status: DC | PRN
  Administered 2018-09-09: 15 mL via PERINEURAL

## 2018-09-09 MED ORDER — DOCUSATE SODIUM 100 MG PO CAPS
100.0000 mg | ORAL_CAPSULE | Freq: Two times a day (BID) | ORAL | Status: DC
  Administered 2018-09-09: 100 mg via ORAL
  Filled 2018-09-09 (×2): qty 1

## 2018-09-09 MED ORDER — ONDANSETRON HCL 4 MG/2ML IJ SOLN
INTRAMUSCULAR | Status: DC | PRN
  Administered 2018-09-09: 4 mg via INTRAVENOUS

## 2018-09-09 SURGICAL SUPPLY — 63 items
ADH SKN CLS APL DERMABOND .7 (GAUZE/BANDAGES/DRESSINGS) ×1
AID PSTN UNV HD RSTRNT DISP (MISCELLANEOUS) ×1
BLADE SAW SGTL 83.5X18.5 (BLADE) ×3 IMPLANT
BSPLAT GLND +2X24 MDLR (Joint) ×1 IMPLANT
COVER SURGICAL LIGHT HANDLE (MISCELLANEOUS) ×3 IMPLANT
COVER WAND RF STERILE (DRAPES) ×3 IMPLANT
CUP SUT UNIV REVERS 36+2 LEFT (Cup) ×3 IMPLANT
DERMABOND ADVANCED (GAUZE/BANDAGES/DRESSINGS) ×2
DERMABOND ADVANCED .7 DNX12 (GAUZE/BANDAGES/DRESSINGS) ×1 IMPLANT
DRAPE ORTHO SPLIT 77X108 STRL (DRAPES) ×6
DRAPE SURG 17X11 SM STRL (DRAPES) ×3 IMPLANT
DRAPE SURG ORHT 6 SPLT 77X108 (DRAPES) ×2 IMPLANT
DRAPE U-SHAPE 47X51 STRL (DRAPES) ×3 IMPLANT
DRSG AQUACEL AG ADV 3.5X10 (GAUZE/BANDAGES/DRESSINGS) ×3 IMPLANT
DURAPREP 26ML APPLICATOR (WOUND CARE) ×3 IMPLANT
ELECT BLADE 4.0 EZ CLEAN MEGAD (MISCELLANEOUS) ×3
ELECT CAUTERY BLADE 6.4 (BLADE) ×3 IMPLANT
ELECT REM PT RETURN 9FT ADLT (ELECTROSURGICAL) ×3
ELECTRODE BLDE 4.0 EZ CLN MEGD (MISCELLANEOUS) ×1 IMPLANT
ELECTRODE REM PT RTRN 9FT ADLT (ELECTROSURGICAL) ×1 IMPLANT
FACESHIELD WRAPAROUND (MASK) ×9 IMPLANT
GLENOID UNI REV MOD 24 +2 LAT (Joint) ×3 IMPLANT
GLENOSPHERE 36 +4 LAT/24 (Joint) ×3 IMPLANT
GLOVE BIO SURGEON STRL SZ7.5 (GLOVE) ×3 IMPLANT
GLOVE BIO SURGEON STRL SZ8 (GLOVE) ×3 IMPLANT
GLOVE EUDERMIC 7 POWDERFREE (GLOVE) ×3 IMPLANT
GLOVE SS BIOGEL STRL SZ 7 (GLOVE) ×1 IMPLANT
GLOVE SS BIOGEL STRL SZ 7.5 (GLOVE) ×2 IMPLANT
GLOVE SUPERSENSE BIOGEL SZ 7 (GLOVE) ×2
GLOVE SUPERSENSE BIOGEL SZ 7.5 (GLOVE) ×4
GOWN STRL REUS W/ TWL LRG LVL3 (GOWN DISPOSABLE) ×1 IMPLANT
GOWN STRL REUS W/ TWL XL LVL3 (GOWN DISPOSABLE) ×2 IMPLANT
GOWN STRL REUS W/TWL LRG LVL3 (GOWN DISPOSABLE) ×2
GOWN STRL REUS W/TWL XL LVL3 (GOWN DISPOSABLE) ×6
KIT BASIN OR (CUSTOM PROCEDURE TRAY) ×3 IMPLANT
KIT TURNOVER KIT B (KITS) ×3 IMPLANT
LINER HUMERAL 36 +3MM SM (Shoulder) ×3 IMPLANT
MANIFOLD NEPTUNE II (INSTRUMENTS) ×3 IMPLANT
NEEDLE TAPERED W/ NITINOL LOOP (MISCELLANEOUS) ×3 IMPLANT
NS IRRIG 1000ML POUR BTL (IV SOLUTION) ×3 IMPLANT
PACK SHOULDER (CUSTOM PROCEDURE TRAY) ×3 IMPLANT
PAD ARMBOARD 7.5X6 YLW CONV (MISCELLANEOUS) ×6 IMPLANT
PIN SET MODULAR GLENOID SYSTEM (PIN) ×3 IMPLANT
RESTRAINT HEAD UNIVERSAL NS (MISCELLANEOUS) ×3 IMPLANT
SCREW CENTRAL MOD 30MM (Screw) ×3 IMPLANT
SCREW PERI LOCK 5.5X16 (Screw) ×3 IMPLANT
SCREW PERI LOCK 5.5X32 (Screw) ×6 IMPLANT
SCREW PERIPHERAL 5.5X20 LOCK (Screw) ×3 IMPLANT
SLING ARM FOAM STRAP LRG (SOFTGOODS) IMPLANT
SLING ARM FOAM STRAP MED (SOFTGOODS) ×3 IMPLANT
SPACER SHLD UNI REV 36 +6 (Shoulder) ×2 IMPLANT
SPACER TI 36/+6MM (Shoulder) ×1 IMPLANT
SPONGE LAP 18X18 X RAY DECT (DISPOSABLE) ×3 IMPLANT
SPONGE LAP 4X18 RFD (DISPOSABLE) ×3 IMPLANT
STEM HUMERAL UNIVERS SZ8 (Stem) ×3 IMPLANT
SUT MNCRL AB 3-0 PS2 18 (SUTURE) ×3 IMPLANT
SUT MON AB 2-0 CT1 27 (SUTURE) ×3 IMPLANT
SUT VIC AB 1 CT1 27 (SUTURE) ×3
SUT VIC AB 1 CT1 27XBRD ANBCTR (SUTURE) ×1 IMPLANT
SUTURE TAPE 1.3 40 TPR END (SUTURE) ×1 IMPLANT
SUTURETAPE 1.3 40 TPR END (SUTURE) ×3
TOWEL OR 17X26 10 PK STRL BLUE (TOWEL DISPOSABLE) ×3 IMPLANT
WATER STERILE IRR 1000ML POUR (IV SOLUTION) ×3 IMPLANT

## 2018-09-09 NOTE — H&P (Signed)
[name]    Chief Complaint: advanced osteoarthritis left shoulder HPI: The patient is a 77 y.o. female with end stage left shoulder rotator cuff dysfunction and arthritis  Past Medical History:  Diagnosis Date  . ALLERGIC RHINITIS 11/30/2008  . ALLERGIC RHINITIS 11/30/2008  . Anxiety   . ASTHMA UNSPECIFIED WITH EXACERBATION 02/17/2008   pt. states she does not and has never has asthma  . CAROTID BRUIT, LEFT 04/19/2007  . Closed fracture of lateral malleolus 06/04/2009  . CLOSED FRACTURE OF METATARSAL BONE 06/04/2009  . Depression   . Esophageal reflux 11/08/2008  . GANGLION CYST 04/19/2007  . HYPERTENSION 04/07/2007  . Hypothyroidism   . INSOMNIA 08/07/2010  . OSTEOARTHRITIS 04/07/2007  . OSTEOPENIA 03/10/2008  . PONV (postoperative nausea and vomiting)   . UTI 07/20/2009    Past Surgical History:  Procedure Laterality Date  . EYE SURGERY Bilateral    cataract removal  . knuckle replacement    . spinal fluid leak Right 01/2018   spinal fluid leaking behind right eye  . TOTAL HIP ARTHROPLASTY    . TUBAL LIGATION      Family History  Problem Relation Age of Onset  . Heart disease Mother        Mother also has significant carotid artery stenosis  . COPD Father   . Alcoholism Father   . Cancer Brother   . Alzheimer's disease Sister   . Thyroid disease Sister        Goiter  . Breast cancer Neg Hx     Social History:  reports that she quit smoking about 50 years ago. Her smoking use included cigarettes. She has a 30.00 pack-year smoking history. She has never used smokeless tobacco. She reports that she does not drink alcohol or use drugs.   Medications Prior to Admission  Medication Sig Dispense Refill  . amphetamine-dextroamphetamine (ADDERALL) 30 MG tablet Take 1 tablet by mouth 2 (two) times daily. Fill in 2 months (Patient taking differently: Take 30 mg by mouth 2 (two) times daily. ) 60 tablet 0  . aspirin-acetaminophen-caffeine (EXCEDRIN MIGRAINE) 250-250-65 MG per  tablet Take 1 tablet by mouth every 6 (six) hours as needed. (Patient taking differently: Take 1 tablet by mouth daily as needed for headache or migraine. ) 30 tablet 0  . B Complex-Folic Acid (B COMPLEX PLUS) TABS Take 1 tablet by mouth daily.    Marland Kitchen buPROPion (WELLBUTRIN XL) 300 MG 24 hr tablet TAKE 1 TABLET BY MOUTH ONCE DAILY (Patient taking differently: Take 300 mg by mouth daily. ) 90 tablet 1  . calcium carbonate (TUMS - DOSED IN MG ELEMENTAL CALCIUM) 500 MG chewable tablet Chew 1 tablet by mouth daily.    . calcium-vitamin D (OSCAL WITH D) 500-200 MG-UNIT tablet Take 1 tablet by mouth daily with breakfast.    . Cholecalciferol (VITAMIN D3) 125 MCG (5000 UT) CAPS Take 5,000 Units by mouth daily.    . diclofenac sodium (VOLTAREN) 1 % GEL APPLY   TOPICALLY TO AFFECTED AREA 4 TIMES DAILY AS NEEDED FOR PAIN (Patient taking differently: Apply 1 g topically 4 (four) times daily as needed. FOR PAIN) 100 g 11  . FLUoxetine (PROZAC) 20 MG capsule TAKE ONE CAPSULE BY MOUTH ONCE DAILY (Patient taking differently: Take 20 mg by mouth daily. ) 90 capsule 1  . levothyroxine (SYNTHROID, LEVOTHROID) 112 MCG tablet TAKE 1 TABLET BY MOUTH ONCE DAILY BEFORE BREAKFAST (Patient taking differently: Take 112 mcg by mouth daily before breakfast. ) 90 tablet  1  . liothyronine (CYTOMEL) 5 MCG tablet Take 1.5 tablets (7.5 mcg total) by mouth daily. 45 tablet 3  . losartan-hydrochlorothiazide (HYZAAR) 100-25 MG tablet Take 1 tablet by mouth daily. 90 tablet 3  . Multiple Vitamin (MULTIVITAMIN) capsule Take 1 capsule by mouth daily. Centrum Silver    . OVER THE COUNTER MEDICATION Take 1 tablet by mouth daily. Joint Support tablet includes:  Primal Celadrine turmeric    . Polyethyl Glycol-Propyl Glycol (SYSTANE) 0.4-0.3 % SOLN Place 2 drops into both eyes 2 (two) times daily.    . ranitidine (ZANTAC) 150 MG tablet TAKE 1 TABLET BY MOUTH TWICE DAILY AS NEEDED FOR HEARTBURN (Patient taking differently: Take 150 mg by mouth 2  (two) times daily as needed for heartburn. ) 180 tablet 1  . conjugated estrogens (PREMARIN) vaginal cream Place 1 Applicatorful vaginally daily. Apply twice per week (Patient not taking: Reported on 09/02/2018) 42.5 g 12     Physical Exam: left shoulder with painful and restricted motion as noted at recent office visits  Vitals  Temp:  [98.2 F (36.8 C)] 98.2 F (36.8 C) (12/26 0614) Resp:  [18] 18 (12/26 0614) BP: (189)/(84) 189/84 (12/26 0614) SpO2:  [96 %] 96 % (12/26 0614) Weight:  [78.2 kg] 78.2 kg (12/26 5183)  Assessment/Plan  Impression: advanced osteoarthritis left shoulder  Plan of Action: Procedure(s): REVERSE SHOULDER ARTHROPLASTY  Charnay Nazario M Sophia Sperry 09/09/2018, 6:56 AM Contact # [phone]

## 2018-09-09 NOTE — Anesthesia Procedure Notes (Signed)
Procedure Name: Intubation Date/Time: 09/09/2018 7:48 AM Performed by: Gwyndolyn Saxon, CRNA Pre-anesthesia Checklist: Patient identified, Emergency Drugs available, Suction available and Patient being monitored Patient Re-evaluated:Patient Re-evaluated prior to induction Oxygen Delivery Method: Circle system utilized Preoxygenation: Pre-oxygenation with 100% oxygen Induction Type: IV induction Ventilation: Mask ventilation without difficulty Laryngoscope Size: Miller and 2 Grade View: Grade I Tube type: Oral Tube size: 7.0 mm Number of attempts: 1 Airway Equipment and Method: Stylet Placement Confirmation: ETT inserted through vocal cords under direct vision,  positive ETCO2 and breath sounds checked- equal and bilateral Secured at: 20 cm Tube secured with: Tape Dental Injury: Teeth and Oropharynx as per pre-operative assessment

## 2018-09-09 NOTE — Transfer of Care (Signed)
Immediate Anesthesia Transfer of Care Note  Patient: [name]  Procedure(s) Performed: REVERSE SHOULDER ARTHROPLASTY (Left Shoulder)  Patient Location: PACU  Anesthesia Type:General  Level of Consciousness: drowsy and patient cooperative  Airway & Oxygen Therapy: Patient Spontanous Breathing and Patient connected to face mask oxygen  Post-op Assessment: Report given to RN and Post -op Vital signs reviewed and stable  Post vital signs: Reviewed and stable  Last Vitals:  Vitals Value Taken Time  BP 134/57 09/09/2018  9:34 AM  Temp    Pulse 78 09/09/2018  9:38 AM  Resp 17 09/09/2018  9:38 AM  SpO2 100 % 09/09/2018  9:38 AM  Vitals shown include unvalidated device data.  Last Pain:  Vitals:   09/09/18 0614  TempSrc: Oral  PainSc:          Complications: No apparent anesthesia complications

## 2018-09-09 NOTE — Anesthesia Preprocedure Evaluation (Signed)
Anesthesia Evaluation  Patient identified by MRN, date of birth, ID band Patient awake    Reviewed: Allergy & Precautions, NPO status , Patient's Chart, lab work & pertinent test results  History of Anesthesia Complications (+) PONV  Airway Mallampati: II  TM Distance: >3 FB Neck ROM: Full    Dental no notable dental hx. (+) Upper Dentures, Partial Lower   Pulmonary former smoker,    Pulmonary exam normal breath sounds clear to auscultation       Cardiovascular hypertension, Pt. on medications Normal cardiovascular exam Rhythm:Regular Rate:Normal     Neuro/Psych Anxiety Depression negative neurological ROS     GI/Hepatic Neg liver ROS, GERD  Poorly Controlled,  Endo/Other  negative endocrine ROS  Renal/GU negative Renal ROS  negative genitourinary   Musculoskeletal negative musculoskeletal ROS (+)   Abdominal   Peds negative pediatric ROS (+)  Hematology negative hematology ROS (+)   Anesthesia Other Findings   Reproductive/Obstetrics negative OB ROS                             Anesthesia Physical Anesthesia Plan  ASA: II  Anesthesia Plan: General   Post-op Pain Management:  Regional for Post-op pain   Induction: Intravenous  PONV Risk Score and Plan: 4 or greater and Ondansetron, Dexamethasone, Metaclopromide and Treatment may vary due to age or medical condition  Airway Management Planned: Oral ETT  Additional Equipment:   Intra-op Plan:   Post-operative Plan: Extubation in OR  Informed Consent: I have reviewed the patients History and Physical, chart, labs and discussed the procedure including the risks, benefits and alternatives for the proposed anesthesia with the patient or authorized representative who has indicated his/her understanding and acceptance.   Dental advisory given  Plan Discussed with: CRNA  Anesthesia Plan Comments:         Anesthesia Quick  Evaluation

## 2018-09-09 NOTE — Anesthesia Postprocedure Evaluation (Signed)
Anesthesia Post Note  Patient: [name]  Procedure(s) Performed: REVERSE SHOULDER ARTHROPLASTY (Left Shoulder)     Patient location during evaluation: PACU Anesthesia Type: General Level of consciousness: awake and alert Pain management: pain level controlled Vital Signs Assessment: post-procedure vital signs reviewed and stable Respiratory status: spontaneous breathing, nonlabored ventilation, respiratory function stable and patient connected to nasal cannula oxygen Cardiovascular status: blood pressure returned to baseline and stable Postop Assessment: no apparent nausea or vomiting Anesthetic complications: no    Last Vitals:  Vitals:   09/09/18 1004 09/09/18 1024  BP: 128/71 116/67  Pulse: 81 79  Resp: 13 14  Temp:  (!) 36.3 C  SpO2: 97% 95%    Last Pain:  Vitals:   09/09/18 1024  TempSrc: Oral  PainSc: 0-No pain                 Montez Hageman

## 2018-09-09 NOTE — Anesthesia Procedure Notes (Signed)
Anesthesia Regional Block: Supraclavicular block   Pre-Anesthetic Checklist: ,, timeout performed, Correct Patient, Correct Site, Correct Laterality, Correct Procedure, Correct Position, site marked, Risks and benefits discussed,  Surgical consent,  Pre-op evaluation,  At surgeon's request and post-op pain management  Laterality: Left and Upper  Prep: Maximum Sterile Barrier Precautions used, chloraprep       Needles:  Injection technique: Single-shot  Needle Type: Echogenic Stimulator Needle     Needle Length: 10cm      Additional Needles:   Procedures:,,,, ultrasound used (permanent image in chart),,,,  Narrative:  Start time: 09/09/2018 6:52 AM End time: 09/09/2018 7:02 AM Injection made incrementally with aspirations every 5 mL.  Performed by: Personally  Anesthesiologist: Montez Hageman, MD  Additional Notes: Risks, benefits and alternative to block explained extensively.  Patient tolerated procedure well, without complications.

## 2018-09-09 NOTE — Op Note (Signed)
09/09/2018  9:24 AM  PATIENT:   [name]  77 y.o. female  PRE-OPERATIVE DIAGNOSIS:  advanced osteoarthritis left shoulder  POST-OPERATIVE DIAGNOSIS: Same  PROCEDURE: Left shoulder reverse arthroplasty utilizing a press-fit size 8 Arthrex stem with a +6 spacer and a +3 polyethylene insert on a 36/+4 glenosphere on a small/+2 baseplate  SURGEON:  [name], Metta Clines M.D.  ASSISTANTS: Pam Loges, PA-C  ANESTHESIA:   General endotracheal with interscalene block using Exparel  EBL: 250 cc  SPECIMEN: None  Drains: None   PATIENT DISPOSITION:  PACU - hemodynamically stable.    PLAN OF CARE: Admit for overnight observation  Brief history:  Patient is a 77 year old female whose had chronic and progressively increasing bilateral shoulder pain related to end-stage osteoarthritis with associated rotator cuff dysfunction.  Due to her increasing pain and functional rotation she is brought to the operating this time for planned left reverse shoulder arthroplasty  Preoperatively and counseled the patient regarding treatment options as well as potential risks versus benefits there.  Possible surgical complications were reviewed including bleeding, infection, neurovascular injury, persistence of pain, failure the implant, anesthetic complication, and possible need for additional surgery.  She understands and accepts and agrees with her planned procedure..  Procedure detail:  After undergoing routine preop evaluation patient received prophylactic antibiotics.  She did have a remote history of a questionable cephalosporin allergy.  On further questioning this was felt to represent incorrect information and the patient did initially receive vancomycin but we did also supplement this with cefazolin and she tolerated the cefazolin without difficulty and so we have made a correction to her medical records such that she does not have a cephalosporin allergy  Patient was brought to the  operating room placed upon on the operative underwent stimulation of a general endotracheal anesthesia.  Placed into the beachchair position and appropriately padded and protected.  Left shoulder girdle region was sterilely prepped and draped in standard fashion.  Timeout was called.  An anterior deltopectoral approach to the left shoulder is made through an 8 cm incision.  Skin flaps elevated dissection carried deeply deltopectoral interval developed a proximal distal with a vein taken laterally.  Upper centimeter the pectoralis major tenotomized to enhance exposure conjoined tendon was then mobilized and retracted medially.  Long head biceps tendon was then tenodesed at the upper border of the pectoralis major and unroofed and excised proximally.  Subscapularis was then divided from the lesser tuberosity and it showed severe contracture and scarring so we did not tagged this for repair.  Capsular attachments then divided from the anterior and inferior margins of the humeral neck and humeral head was then delivered through the wound and the rotator cuff was severely deficient superiorly consistent with the known chronic rotator cuff dysfunction.  We then outlined the humeral head resection using her extra medullary guide and the humeral head was then resected with an oscillating saw the native retroversion approximate 20 degrees.  Rondure was then used remove the osteophytes and margin of the humeral neck.  Metal cap placed over the cut proximal humeral surface we then exposed the glenoid with appropriate retractors and a circumferential labral resection was performed gaining complete visualization the periphery of the glenoid.  A guidepin was then directed into the center of the glenoid at a 10 degree inferior tilt and the glenoid was then prepared with a central and peripheral reamer and all debrided the margins the glenoid was then carefully removed.  We then performed a final  preparation with our central drill  and tapped and a 30 mm lag screw was placed to the small baseplate this was then inserted with excellent fit and fixation.  All 4 peripheral locking screws were then passed with excellent fit and fixation.  A 36/+4 glenosphere was then impacted onto the baseplate and locked into position.  We then returned our attention to the humeral shaft with the canal was prepared hand reaming to size 7 broaching to size 8 metaphysis was then reamed with a posterior offset.  Trial implant was placed and this showed good soft tissue balance.  Our final implant was then assembled on the back table and impacted into position with excellent fit and fixation.  A series of trial reductions was then performed with the total of +9 off the implant showing excellent soft tissue balance.  We then utilized a +6 spacer and a +3 poly-under the final implant and a final reduction was then performed showing excellent soft tissue balance and good stability good shoulder motion.  The wound was then copiously irrigated.  Final hemostasis was obtained.  The deltopectoral interval was repaired with a series of figure-of-eight #1 Vicryl sutures.  2-0 Vicryl used for the subcu layer and intracuticular 3 Monocryl for the skin followed by Dermabond and Aquasol dressing left arm was then placed in the sling the patient is awakened, extubated, and taken recovery in stable addition.  [name] for PA-C was used as an Environmental consultant throughout this case essential for help with positioning of the patient, position extremity, tissue manipulation, implantation the prosthesis, wound closure, and intraoperative decision-making.  Metta Clines [name] MD   Contact # [phone]

## 2018-09-10 ENCOUNTER — Encounter (HOSPITAL_COMMUNITY): Payer: Self-pay | Admitting: Orthopedic Surgery

## 2018-09-10 MED ORDER — ONDANSETRON HCL 4 MG PO TABS: 4.0000 mg | ORAL_TABLET | Freq: Three times a day (TID) | ORAL | 0 refills | Status: DC | PRN

## 2018-09-10 MED ORDER — OXYCODONE-ACETAMINOPHEN 5-325 MG PO TABS: 1.0000 | ORAL_TABLET | ORAL | 0 refills | Status: DC | PRN

## 2018-09-10 NOTE — Discharge Summary (Signed)
PATIENT ID:      [name]  MRN:     [id number] DOB/AGE:    1940-12-23 / 77 y.o.     DISCHARGE SUMMARY  ADMISSION DATE:    09/09/2018 DISCHARGE DATE:    ADMISSION DIAGNOSIS: advanced osteoarthritis left shoulder Past Medical History:  Diagnosis Date  . ALLERGIC RHINITIS 11/30/2008  . ALLERGIC RHINITIS 11/30/2008  . Anxiety   . ASTHMA UNSPECIFIED WITH EXACERBATION 02/17/2008   pt. states she does not and has never has asthma  . CAROTID BRUIT, LEFT 04/19/2007  . Closed fracture of lateral malleolus 06/04/2009  . CLOSED FRACTURE OF METATARSAL BONE 06/04/2009  . Depression   . Esophageal reflux 11/08/2008  . GANGLION CYST 04/19/2007  . HYPERTENSION 04/07/2007  . Hypothyroidism   . INSOMNIA 08/07/2010  . OSTEOARTHRITIS 04/07/2007  . OSTEOPENIA 03/10/2008  . PONV (postoperative nausea and vomiting)   . UTI 07/20/2009    DISCHARGE DIAGNOSIS:   Active Problems:   S/P reverse total shoulder arthroplasty, left   PROCEDURE: Procedure(s): REVERSE SHOULDER ARTHROPLASTY on 09/09/2018  CONSULTS:    HISTORY:  See H&P in chart.  HOSPITAL COURSE:  [name] is a 77 y.o. admitted on 09/09/2018 with a diagnosis of advanced osteoarthritis left shoulder.  They were brought to the operating room on 09/09/2018 and underwent Procedure(s): [place].    They were given perioperative antibiotics:  Anti-infectives (From admission, onward)   Start     Dose/Rate Route Frequency Ordered Stop   09/09/18 0600  vancomycin (VANCOCIN) IVPB 1000 mg/200 mL premix     1,000 mg 200 mL/hr over 60 Minutes Intravenous On call to O.R. 09/09/18 0541 09/09/18 0710    .  Patient underwent the above named procedure and tolerated it well. The following day they were hemodynamically stable and pain was controlled on oral analgesics. They were neurovascularly intact to the operative extremity. OT was ordered and worked with patient per protocol. They were medically and orthopaedically stable for  discharge on day 1 .    DIAGNOSTIC STUDIES:  RECENT RADIOGRAPHIC STUDIES :  No results found.  RECENT VITAL SIGNS:   Patient Vitals for the past 24 hrs:  BP Temp Temp src Pulse Resp SpO2  09/10/18 0432 126/64 98.3 F (36.8 C) Oral 89 16 98 %  09/10/18 0008 116/70 99 F (37.2 C) Oral 96 18 99 %  09/09/18 1926 118/64 - - 90 17 97 %  09/09/18 1340 (!) 112/56 98.2 F (36.8 C) Oral 90 16 99 %  09/09/18 1024 116/67 (!) 97.3 F (36.3 C) Oral 79 14 95 %  09/09/18 1004 128/71 - - 81 13 97 %  09/09/18 0949 131/68 - - 80 13 95 %  09/09/18 0945 - 98.1 F (36.7 C) - - - -  09/09/18 0935 - - - 76 12 99 %  09/09/18 0934 (!) 134/57 - - - 14 -  .  RECENT EKG RESULTS:    Orders placed or performed during the hospital encounter of 09/02/18  . EKG 12 lead  . EKG 12 lead    DISCHARGE INSTRUCTIONS:    DISCHARGE MEDICATIONS:   Allergies as of 09/10/2018   No Active Allergies     Medication List    TAKE these medications   amphetamine-dextroamphetamine 30 MG tablet Commonly known as:  ADDERALL Take 1 tablet by mouth 2 (two) times daily. Fill in 2 months What changed:  additional instructions   aspirin-acetaminophen-caffeine 505-397-67 MG tablet Commonly known as:  EXCEDRIN  MIGRAINE Take 1 tablet by mouth every 6 (six) hours as needed. What changed:    when to take this  reasons to take this   B COMPLEX PLUS Tabs Take 1 tablet by mouth daily.   buPROPion 300 MG 24 hr tablet Commonly known as:  WELLBUTRIN XL TAKE 1 TABLET BY MOUTH ONCE DAILY   calcium carbonate 500 MG chewable tablet Commonly known as:  TUMS - dosed in mg elemental calcium Chew 1 tablet by mouth daily.   calcium-vitamin D 500-200 MG-UNIT tablet Commonly known as:  OSCAL WITH D Take 1 tablet by mouth daily with breakfast.   conjugated estrogens vaginal cream Commonly known as:  PREMARIN Place 1 Applicatorful vaginally daily. Apply twice per week   diclofenac sodium 1 % Gel Commonly known as:   VOLTAREN APPLY   TOPICALLY TO AFFECTED AREA 4 TIMES DAILY AS NEEDED FOR PAIN What changed:    how much to take  how to take this  when to take this  reasons to take this  additional instructions   FLUoxetine 20 MG capsule Commonly known as:  PROZAC TAKE ONE CAPSULE BY MOUTH ONCE DAILY   levothyroxine 112 MCG tablet Commonly known as:  SYNTHROID, LEVOTHROID TAKE 1 TABLET BY MOUTH ONCE DAILY BEFORE BREAKFAST What changed:    how much to take  how to take this  when to take this  additional instructions   liothyronine 5 MCG tablet Commonly known as:  CYTOMEL Take 1.5 tablets (7.5 mcg total) by mouth daily.   losartan-hydrochlorothiazide 100-25 MG tablet Commonly known as:  HYZAAR Take 1 tablet by mouth daily.   multivitamin capsule Take 1 capsule by mouth daily. Centrum Silver   ondansetron 4 MG tablet Commonly known as:  ZOFRAN Take 1 tablet (4 mg total) by mouth every 8 (eight) hours as needed for nausea or vomiting.   OVER THE COUNTER MEDICATION Take 1 tablet by mouth daily. Joint Support tablet includes:  Primal Celadrine turmeric   oxyCODONE-acetaminophen 5-325 MG tablet Commonly known as:  PERCOCET Take 1 tablet by mouth every 4 (four) hours as needed (max 6 q).   ranitidine 150 MG tablet Commonly known as:  ZANTAC TAKE 1 TABLET BY MOUTH TWICE DAILY AS NEEDED FOR HEARTBURN What changed:    how much to take  how to take this  when to take this  reasons to take this  additional instructions   SYSTANE 0.4-0.3 % Soln Generic drug:  Polyethyl Glycol-Propyl Glycol Place 2 drops into both eyes 2 (two) times daily.   Vitamin D3 125 MCG (5000 UT) Caps Take 5,000 Units by mouth daily.       FOLLOW UP VISIT:   Follow-up Information    Justice Britain, MD.   Specialty:  Orthopedic Surgery Why:  call to be seen in 10-14 days Contact information: [address] [place] [id number] [phone]            DISCHARGE TO: Home   DISCHARGE CONDITION:  [place] for Dr. Justice Britain 09/10/2018, 8:27 AM

## 2018-09-10 NOTE — Progress Notes (Signed)
Pt alert, able to make needs known. Oxygen removed as saturation was 100%. Remained 98% after removal. Pt denies pain at this time, block is still in effect. Able to move fingers, grip remains weak. Medicated for pain prior to OT. Husband remains at bedside. Will continue to monitor.

## 2018-09-10 NOTE — Plan of Care (Signed)
  Problem: Education: Goal: Knowledge of the prescribed therapeutic regimen will improve Outcome: Adequate for Discharge Goal: Understanding of activity limitations/precautions following surgery will improve Outcome: Adequate for Discharge Goal: Individualized Educational Video(s) Outcome: Adequate for Discharge   Problem: Activity: Goal: Ability to tolerate increased activity will improve Outcome: Adequate for Discharge   Problem: Pain Management: Goal: Pain level will decrease with appropriate interventions Outcome: Adequate for Discharge   Problem: Education: Goal: Knowledge of General Education information will improve Description Including pain rating scale, medication(s)/side effects and non-pharmacologic comfort measures Outcome: Adequate for Discharge

## 2018-09-10 NOTE — Discharge Instructions (Signed)
Metta Clines. Supple, M.D., F.A.A.O.S. Orthopaedic Surgery Specializing in Arthroscopic and Reconstructive Surgery of the Shoulder and Knee [phone] [address] [place], [place] [address] - Fax [phone]   POST-OP TOTAL SHOULDER REPLACEMENT INSTRUCTIONS  TAKE OTC IBUPROFEN FOR 1 WEEK (600 mg 3 times day) as discussed and then as needed  1. Call the office at [phone] to schedule your first post-op appointment 10-14 days from the date of your surgery.  2. The bandage over your incision is waterproof. You may begin showering with this dressing on. You may leave this dressing on until first follow up appointment within 2 weeks. We prefer you leave this dressing in place until follow up however after 5-7 days if you are having itching or skin irritation and would like to remove it you may do so. Go slow and tug at the borders gently to break the bond the dressing has with the skin. At this point if there is no drainage it is okay to go without a bandage or you may cover it with a light guaze and tape. You can also expect significant bruising around your shoulder that will drift down your arm and into your chest wall. This is very normal and should resolve over several days.   3. Wear your sling/immobilizer at all times except to perform the exercises below or to occasionally let your arm dangle by your side to stretch your elbow. You also need to sleep in your sling immobilizer until instructed otherwise.  4. Range of motion to your elbow, wrist, and hand are encouraged 3-5 times daily. Exercise to your hand and fingers helps to reduce swelling you may experience.  5. Utilize ice to the shoulder 3-5 times minimum a day and additionally if you are experiencing pain.  6. Prescriptions for a pain medication and a muscle relaxant are provided for you. It is recommended that if you are experiencing pain that you pain medication alone is not controlling, add the muscle relaxant  along with the pain medication which can give additional pain relief. The first 1-2 days is generally the most severe of your pain and then should gradually decrease. As your pain lessens it is recommended that you decrease your use of the pain medications to an "as needed basis'" only and to always comply with the recommended dosages of the pain medications.  7. Pain medications can produce constipation along with their use. If you experience this, the use of an over the counter stool softener or laxative daily is recommended.   8. For additional questions or concerns, please do not hesitate to call the office. If after hours there is an answering service to forward your concerns to the physician on call.  9.Pain control following an exparel block  To help control your post-operative pain you received a nerve block  performed with Exparel which is a long acting anesthetic (numbing agent) which can provide pain relief and sensations of numbness (and relief of pain) in the operative shoulder and arm for up to 3 days. Sometimes it provides mixed relief, meaning you may still have numbness in certain areas of the arm but can still be able to move  parts of that arm, hand, and fingers. We recommend that your prescribed pain medications  be used as needed. We do not feel it is necessary to "pre medicate" and "stay ahead" of pain.  Taking narcotic pain medications when you are not having any pain can lead to unnecessary and potentially dangerous  side effects.   POST-OP EXERCISES  Pendulum Exercises  Perform pendulum exercises while standing and bending at the waist. Support your uninvolved arm on a table or chair and allow your operated arm to hang freely. Make sure to do these exercises passively - not using you shoulder muscles.  Repeat 20 times. Do 3 sessions per day.

## 2018-09-10 NOTE — Evaluation (Signed)
Occupational Therapy Evaluation and Discharge Patient Details Name: [name] MRN: [id number] DOB: Apr 07, 1941 Today's Date: 09/10/2018    History of Present Illness Pt is a 77 y/o female s/p L TSA. Pt has a PMH including Anxiety, Depression, Esophageal reflux (11/08/2008), HTN, Hypothyroidism, INSOMNIA, OSTEOPENIA (03/10/2008), Total hip arthroplasty; Eye surgery (Bilateral); and Functional endoscopic sinus surgery (01/2018).   Clinical Impression   PTA Pt independent in ADL and mobility. Pt is currently max A for UB ADL (bathing/dressing) block still largely in place - so educated Pt and husband with complete demo of exercises - but Pt did not perform - Pt dressed at end of session. Active Shoulder precautions (dc shoulder handout provided): Educated patient on don doff sling with return demonstration,washing under arms and shower safety, positioning with pillows in chair for support and coming out of sling to give neck a break in controlled environment, sleeping positioning (recommend recliner if possible), pt educated on sequence/safety/compensatory strategies for dressing.  Home exercise program as stated below (indicated by MD). Education complete, husband and Pt confirmed understanding. OT to defer further therapy at MD at follow up.      Follow Up Recommendations  Follow surgeon's recommendation for DC plan and follow-up therapies    Equipment Recommendations  None recommended by OT    Recommendations for Other Services       Precautions / Restrictions Precautions Precautions: Shoulder Type of Shoulder Precautions: active protocol Shoulder Interventions: Shoulder sling/immobilizer;At all times;Off for dressing/bathing/exercises Precaution Booklet Issued: Yes (comment) Precaution Comments: handout reviewed in full Required Braces or Orthoses: Sling Restrictions Weight Bearing Restrictions: Yes LUE Weight Bearing: Non weight bearing Other Position/Activity Restrictions: ok  to come out of sling in controlled environment and give neck a break      Mobility Bed Mobility Overal bed mobility: Modified Independent             General bed mobility comments: no attempt to push/pull with LUE  Transfers Overall transfer level: Needs assistance   Transfers: Sit to/from Stand Sit to Stand: Min guard         General transfer comment: initial sit <>stand required min a for balance    Balance Overall balance assessment: Mild deficits observed, not formally tested                                         ADL either performed or assessed with clinical judgement   ADL                                         General ADL Comments: please see shoulder section below     Vision Patient Visual Report: No change from baseline       Perception     Praxis      Pertinent Vitals/Pain Pain Assessment: 0-10 Pain Score: 1  Pain Location: L shoulder Pain Descriptors / Indicators: Dull Pain Intervention(s): Limited activity within patient's tolerance;Monitored during session;Repositioned(block still largely in place)     Hand Dominance Right   Extremity/Trunk Assessment Upper Extremity Assessment Upper Extremity Assessment: LUE deficits/detail LUE Deficits / Details: post-op deficits as anticipated LUE: Unable to fully assess due to immobilization LUE Sensation: decreased light touch LUE Coordination: decreased fine motor;decreased gross motor   Lower Extremity Assessment Lower Extremity Assessment: Overall [facility]  for tasks assessed       Communication Communication Communication: No difficulties   Cognition Arousal/Alertness: Awake/alert Behavior During Therapy: WFL for tasks assessed/performed Overall Cognitive Status: Within Functional Limits for tasks assessed                                     General Comments  husband present throughout session    Exercises Exercises: Shoulder Shoulder  Exercises Pendulum Exercise: PROM;Left;10 reps;Seated;Standing(educated, not performed due to block) Shoulder Flexion: PROM;AROM;AAROM;Left(Educated for functional ADL only to 60) Shoulder ABduction: PROM;AROM;AAROM;Left(Educated for functional ADL only to 45) Shoulder External Rotation: PROM;AROM;AAROM;Left(Educated for functional ADL only to 20) Elbow Flexion: AROM;Left Elbow Extension: AROM;Left Wrist Flexion: AROM;Left Wrist Extension: AROM;Left Digit Composite Flexion: AROM;Left Composite Extension: AROM;Left Neck Flexion: AROM Neck Extension: AROM Neck Lateral Flexion - Right: AROM Neck Lateral Flexion - Left: AROM   Shoulder Instructions Shoulder Instructions Donning/doffing shirt without moving shoulder: Maximal assistance Method for sponge bathing under operated UE: Minimal assistance Donning/doffing sling/immobilizer: Maximal assistance Correct positioning of sling/immobilizer: Maximal assistance Pendulum exercises (written home exercise program): (educated not performed) ROM for elbow, wrist and digits of operated UE: Supervision/safety Sling wearing schedule (on at all times/off for ADL's): Modified independent Proper positioning of operated UE when showering: Supervision/safety Positioning of UE while sleeping: Minimal assistance    Home Living Family/patient expects to be discharged to:: Private residence Living Arrangements: Spouse/significant other Available Help at Discharge: Family;Available 24 hours/day Type of Home: House       Home Layout: One level     Bathroom Shower/Tub: Occupational psychologist: Standard                Prior Functioning/Environment Level of Independence: Independent                 OT Problem List: Decreased range of motion;Decreased knowledge of use of DME or AE;Decreased knowledge of precautions;Impaired UE functional use;Pain;Impaired sensation      OT Treatment/Interventions:      OT Goals(Current  goals can be found in the care plan section) Acute Rehab OT Goals Patient Stated Goal: to get back to gardening OT Goal Formulation: With patient/family Time For Goal Achievement: 09/24/18 Potential to Achieve Goals: Good  OT Frequency:     Barriers to D/C:            Co-evaluation              AM-PAC OT "6 Clicks" Daily Activity     Outcome Measure Help from another person eating meals?: A Little Help from another person taking care of personal grooming?: A Little Help from another person toileting, which includes using toliet, bedpan, or urinal?: A Little Help from another person bathing (including washing, rinsing, drying)?: A Little Help from another person to put on and taking off regular upper body clothing?: A Little Help from another person to put on and taking off regular lower body clothing?: A Lot 6 Click Score: 17   End of Session Equipment Utilized During Treatment: Other (comment)(sling) Nurse Communication: Mobility status;Precautions;Weight bearing status  Activity Tolerance: Patient tolerated treatment well Patient left: in chair;with call bell/phone within reach  OT Visit Diagnosis: Pain Pain - Right/Left: Left Pain - part of body: Shoulder                Time: 5102-5852 OT Time Calculation (min): 54 min Charges:  OT General Charges $  OT Visit: 1 Visit OT Evaluation $OT Eval Moderate Complexity: 1 Mod OT Treatments $Self Care/Home Management : 23-37 mins $Therapeutic Exercise: 8-22 mins  Hulda Humphrey OTR/L Acute Rehabilitation Services Pager: [phone] Office: [phone]  Merri Ray Jewelz Ricklefs 09/10/2018, 10:29 AM

## 2018-09-20 DIAGNOSIS — Z96612 Presence of left artificial shoulder joint: Secondary | ICD-10-CM | POA: Diagnosis not present

## 2018-09-27 ENCOUNTER — Telehealth (HOSPITAL_COMMUNITY): Payer: Self-pay | Admitting: Internal Medicine

## 2018-09-27 NOTE — Telephone Encounter (Signed)
Will try and call back later.

## 2018-10-12 DIAGNOSIS — F39 Unspecified mood [affective] disorder: Secondary | ICD-10-CM | POA: Diagnosis not present

## 2018-10-12 DIAGNOSIS — F419 Anxiety disorder, unspecified: Secondary | ICD-10-CM | POA: Diagnosis not present

## 2018-10-13 ENCOUNTER — Ambulatory Visit (HOSPITAL_COMMUNITY): Payer: Self-pay

## 2018-10-25 ENCOUNTER — Encounter (HOSPITAL_COMMUNITY): Payer: Self-pay

## 2018-10-25 ENCOUNTER — Telehealth (HOSPITAL_COMMUNITY): Payer: Self-pay | Admitting: Internal Medicine

## 2018-10-25 ENCOUNTER — Ambulatory Visit (HOSPITAL_COMMUNITY): Payer: Medicare Other | Admitting: Specialist

## 2018-10-25 NOTE — Telephone Encounter (Signed)
10/25/18  pt left a message at 8:53 to cx said that she couldn't come because she was sick but does want to reschedule

## 2018-10-26 ENCOUNTER — Other Ambulatory Visit: Payer: Self-pay

## 2018-10-26 ENCOUNTER — Ambulatory Visit (HOSPITAL_COMMUNITY): Payer: Medicare Other | Attending: Orthopedic Surgery

## 2018-10-26 ENCOUNTER — Encounter (HOSPITAL_COMMUNITY): Payer: Self-pay

## 2018-10-26 DIAGNOSIS — M25512 Pain in left shoulder: Secondary | ICD-10-CM | POA: Diagnosis not present

## 2018-10-26 DIAGNOSIS — R29898 Other symptoms and signs involving the musculoskeletal system: Secondary | ICD-10-CM | POA: Insufficient documentation

## 2018-10-26 DIAGNOSIS — M25612 Stiffness of left shoulder, not elsewhere classified: Secondary | ICD-10-CM | POA: Insufficient documentation

## 2018-10-26 NOTE — Patient Instructions (Addendum)
Perform each exercise ___10-12_____ reps. 2-3x days.   Protraction - STANDING/sitting  Start by holding a wand or cane at chest height.  Next, slowly push the wand outwards in front of your body so that your elbows become fully straightened. Then, return to the original position.     Shoulder FLEXION - STANDING/sitting - PALMS DOWN  In the standing position, hold a wand/cane with both arms, palms down on both sides. Raise up the wand/cane allowing your unaffected arm to perform most of the effort. Your affected arm should be partially relaxed.    Internal/External ROTATION - STANDING/sitting  In the standing position, hold a wand/cane with both hands keeping your elbows bent. Move your arms and wand/cane to one side.  Your affected arm should be partially relaxed while your unaffected arm performs most of the effort.       Shoulder ABDUCTION - STANDING/sitting  While holding a wand/cane palm face up on the injured side and palm face down on the uninjured side, slowly raise up your injured arm to the side.                Horizontal Abduction/Adduction      Straight arms holding cane at shoulder height, bring cane to right, center, left. Repeat starting to left.   Copyright  VHI. All rights reserved.

## 2018-10-26 NOTE — Therapy (Signed)
[place], [place], [address] Phone: [phone]   Fax:  [phone]  Occupational Therapy Evaluation  Patient Details  Name: [name] MRN: [id number] Date of Birth: March 18, 1941 Referring Provider (OT): Justice Britain, MD   Encounter Date: 10/26/2018  OT End of Session - 10/26/18 1416    Visit Number  1    Number of Visits  8    Date for OT Re-Evaluation  11/23/18    Authorization Type  1) medicare 2) [employer] supplemental    Authorization Time Period  No visit limit    OT Start Time  1120    OT Stop Time  1200    OT Time Calculation (min)  40 min    Activity Tolerance  Patient tolerated treatment well    Behavior During Therapy  [facility] for tasks assessed/performed       Past Medical History:  Diagnosis Date  . ALLERGIC RHINITIS 11/30/2008  . ALLERGIC RHINITIS 11/30/2008  . Anxiety   . ASTHMA UNSPECIFIED WITH EXACERBATION 02/17/2008   pt. states she does not and has never has asthma  . CAROTID BRUIT, LEFT 04/19/2007  . Closed fracture of lateral malleolus 06/04/2009  . CLOSED FRACTURE OF METATARSAL BONE 06/04/2009  . Depression   . Esophageal reflux 11/08/2008  . GANGLION CYST 04/19/2007  . HYPERTENSION 04/07/2007  . Hypothyroidism   . INSOMNIA 08/07/2010  . OSTEOARTHRITIS 04/07/2007  . OSTEOPENIA 03/10/2008  . PONV (postoperative nausea and vomiting)   . UTI 07/20/2009    Past Surgical History:  Procedure Laterality Date  . EYE SURGERY Bilateral    cataract removal  . FUNCTIONAL ENDOSCOPIC SINUS SURGERY  01/2018   spinal fluid leaking behind right eye  . JOINT REPLACEMENT     knuckle  . REVERSE SHOULDER ARTHROPLASTY Left 09/09/2018  . REVERSE SHOULDER ARTHROPLASTY Left 09/09/2018   Procedure: REVERSE SHOULDER ARTHROPLASTY;  Surgeon: Justice Britain, MD;  Location: [facility];  Service: Orthopedics;  Laterality: Left;  130min  . TOTAL HIP ARTHROPLASTY    . TUBAL LIGATION      There were no vitals filed for this  visit.  Subjective Assessment - 10/26/18 1130    Subjective   S: I've been doing the exercises that they gave after I had the surgery.    Pertinent History  Patient is a 78 y/o female S/P left shoulder reverse TSA on 09/09/18. She has been completing some exercises at home there were provided to her after surgery. She is interested in receiving new ones. Dr. Onnie Graham has referred patient to occupational therapy for evaluation and treatment.     Special Tests  FOTO next session.    Patient Stated Goals  I want to be able to use this arm without pain.    Currently in Pain?  No/denies        [facility] OT Assessment - 10/26/18 1130      Assessment   Medical Diagnosis  Left reverse shoulder TSA    Referring Provider (OT)  Justice Britain, MD    Onset Date/Surgical Date  09/09/18    Hand Dominance  Right    Next MD Visit  Unknown    Prior Therapy  Patient has not received any therapy for this condition.      Precautions   Precautions  Shoulder    Type of Shoulder Precautions  Week 8-12: Progress A/ROM and P/ROM to tolerance. Begin sidelying exercises. May add tubing and gradual resistance to exercises.  Restrictions   Weight Bearing Restrictions  No      Balance Screen   Has the patient fallen in the past 6 months  No      Home  Environment   Family/patient expects to be discharged to:  Private residence      Prior Function   Level of Independence  Independent with basic ADLs    Vocation  Retired      ROM / Strength   AROM / PROM / Strength  AROM;PROM;Strength      Palpation   Palpation comment  max fascial restrictions in left upper arm, trapezius, and scapularis region.       AROM   Overall AROM Comments  Assessed seated. IR/er adducted    AROM Assessment Site  Shoulder    Right/Left Shoulder  Left    Left Shoulder Flexion  98 Degrees    Left Shoulder ABduction  70 Degrees    Left Shoulder Internal Rotation  90 Degrees    Left Shoulder External Rotation  20 Degrees       PROM   Overall PROM Comments  Assessed supine. IR/er adducted    PROM Assessment Site  Shoulder    Right/Left Shoulder  Left    Left Shoulder Flexion  123 Degrees    Left Shoulder ABduction  116 Degrees    Left Shoulder Internal Rotation  90 Degrees    Left Shoulder External Rotation  40 Degrees      Strength   Overall Strength Comments  Assessed seated. IR/er adducted    Strength Assessment Site  Shoulder    Right/Left Shoulder  Left    Left Shoulder Flexion  3-/5    Left Shoulder ABduction  3-/5    Left Shoulder Internal Rotation  3+/5    Left Shoulder External Rotation  3-/5                      OT Education - 10/26/18 1416    Education Details  AA/ROM shoulder exercises - standing/sitting    Person(s) Educated  Patient    Methods  Explanation;Demonstration;Verbal cues;Handout;Tactile cues    Comprehension  Returned demonstration;Verbalized understanding;Need further instruction       OT Short Term Goals - 10/26/18 1422      OT SHORT TERM GOAL #1   Title  Patient will be educated and independent with HEP in order to facilitate progress in therapy and increase functional use of LUE during daily tasks.     Time  4    Period  Weeks    Status  New    Target Date  11/23/18      OT SHORT TERM GOAL #2   Title  Pt will decrease LUE fascial restrictions from to minimal amounts or less to improve mobility required for functional reaching.     Time  4    Period  Weeks    Status  New      OT SHORT TERM GOAL #3   Title  Pt will decrease pain in LUE to 3/10 or less to improve completion of ADL tasks with minimal compensatory strategies.     Time  4    Period  Weeks    Status  New      OT SHORT TERM GOAL #4   Title  Pt will improve LUE ROM to [facility] to improve ability to reach behind back for seatbelts and fix her hair.    Time  4  Period  Weeks    Status  New      OT SHORT TERM GOAL #5   Title  Pt will improve LUE strength to 4/5 to increase ability to  complete yardwork and housework tasks.     Time  4    Period  Weeks    Status  New               Plan - 10/26/18 1418    Clinical Impression Statement  A: Patient is a 78 y/o female S/P left shoulder reverse TSA causing increased pain, fascial restrictions and decreased ROM and strength resulting in difficulty completing daily tasks using her LUE as her non-dominant.     Occupational Profile and client history currently impacting functional performance  Patient is motivated to return to prior level of function. Strong social support at home.     Occupational performance deficits (Please refer to evaluation for details):  ADL's;Rest and Sleep    Rehab Potential  Excellent    Current Impairments/barriers affecting progress:  Decreased memory may be a barrier for exercise completion at home.     OT Frequency  2x / week    OT Duration  4 weeks    OT Treatment/Interventions  Self-care/ADL training;Electrical Stimulation;Therapeutic exercise;Patient/family education;Neuromuscular education;Moist Heat;Therapeutic activities;Passive range of motion;Manual Therapy;DME and/or AE instruction;Ultrasound;Cryotherapy    Plan  P: Patient will benefit from skilled OT services to increase functional performance during daily tasks in order to complete tasks using her left UE in additional to increasing her quality of sleep at night due to less pain and discomfort. Treatment Plan: Myofascial release, passive ROM, AA/ROM , A/ROM, general shoulder and scapular strengthening. Night time positioning education.     Clinical Decision Making  Limited treatment options, no task modification necessary    Consulted and Agree with Plan of Care  Patient       Patient will benefit from skilled therapeutic intervention in order to improve the following deficits and impairments:  Pain, Increased fascial restrictions, Decreased range of motion, Decreased strength, Impaired UE functional use, Decreased mobility  Visit  Diagnosis: Stiffness of left shoulder, not elsewhere classified - Plan: Ot plan of care cert/re-cert  Other symptoms and signs involving the musculoskeletal system - Plan: Ot plan of care cert/re-cert  Acute pain of left shoulder - Plan: Ot plan of care cert/re-cert    Problem List Patient Active Problem List   Diagnosis Date Noted  . S/P reverse total shoulder arthroplasty, left 09/09/2018  . Hypothyroidism, postop 11/19/2016  . Cyst in hand 08/23/2014  . Preventative health care 07/10/2014  . Attention deficit disorder 07/10/2014  . Obesity (BMI 30-39.9) 12/26/2013  . GERD (gastroesophageal reflux disease) 11/17/2012  . Depression with anxiety 10/28/2010  . INSOMNIA 08/07/2010  . ALLERGIC RHINITIS 11/30/2008  . OSTEOPENIA 03/10/2008  . Hyperlipidemia 04/19/2007  . Left carotid bruit 04/19/2007  . Essential hypertension 04/07/2007  . Osteoarthritis 04/07/2007   Ailene Ravel, OTR/L,CBIS  [phone]  10/26/2018, 2:26 PM  [facility] [address] [place], [place], [address] Phone: [phone]   Fax:  [phone]  Name: [name] MRN: [id number] Date of Birth: 08/31/1941

## 2018-10-27 DIAGNOSIS — E782 Mixed hyperlipidemia: Secondary | ICD-10-CM | POA: Diagnosis not present

## 2018-10-27 DIAGNOSIS — F909 Attention-deficit hyperactivity disorder, unspecified type: Secondary | ICD-10-CM | POA: Diagnosis not present

## 2018-10-27 DIAGNOSIS — E039 Hypothyroidism, unspecified: Secondary | ICD-10-CM | POA: Diagnosis not present

## 2018-10-27 DIAGNOSIS — I10 Essential (primary) hypertension: Secondary | ICD-10-CM | POA: Diagnosis not present

## 2018-11-01 ENCOUNTER — Ambulatory Visit (HOSPITAL_COMMUNITY): Payer: Medicare Other

## 2018-11-01 ENCOUNTER — Encounter (HOSPITAL_COMMUNITY): Payer: Self-pay

## 2018-11-01 DIAGNOSIS — M25512 Pain in left shoulder: Secondary | ICD-10-CM

## 2018-11-01 DIAGNOSIS — M25612 Stiffness of left shoulder, not elsewhere classified: Secondary | ICD-10-CM | POA: Diagnosis not present

## 2018-11-01 DIAGNOSIS — R29898 Other symptoms and signs involving the musculoskeletal system: Secondary | ICD-10-CM | POA: Diagnosis not present

## 2018-11-01 NOTE — Therapy (Signed)
[place], [place], [address] Phone: [phone]   Fax:  [phone]  Occupational Therapy Treatment  Patient Details  Name: [name] MRN: [id number] Date of Birth: [birth year] Referring Provider (OT): Justice Britain, MD   Encounter Date: 11/01/2018  OT End of Session - 11/01/18 1624    Visit Number  2    Number of Visits  8    Date for OT Re-Evaluation  11/23/18    Authorization Type  1) medicare 2) [employer] supplemental    Authorization Time Period  No visit limit    OT Start Time  1433    OT Stop Time  1515    OT Time Calculation (min)  42 min    Activity Tolerance  Patient tolerated treatment well    Behavior During Therapy  [facility] for tasks assessed/performed       Past Medical History:  Diagnosis Date  . ALLERGIC RHINITIS 11/30/2008  . ALLERGIC RHINITIS 11/30/2008  . Anxiety   . ASTHMA UNSPECIFIED WITH EXACERBATION 02/17/2008   pt. states she does not and has never has asthma  . CAROTID BRUIT, LEFT 04/19/2007  . Closed fracture of lateral malleolus 06/04/2009  . CLOSED FRACTURE OF METATARSAL BONE 06/04/2009  . Depression   . Esophageal reflux 11/08/2008  . GANGLION CYST 04/19/2007  . HYPERTENSION 04/07/2007  . Hypothyroidism   . INSOMNIA 08/07/2010  . OSTEOARTHRITIS 04/07/2007  . OSTEOPENIA 03/10/2008  . PONV (postoperative nausea and vomiting)   . UTI 07/20/2009    Past Surgical History:  Procedure Laterality Date  . EYE SURGERY Bilateral    cataract removal  . FUNCTIONAL ENDOSCOPIC SINUS SURGERY  01/2018   spinal fluid leaking behind right eye  . JOINT REPLACEMENT     knuckle  . REVERSE SHOULDER ARTHROPLASTY Left 09/09/2018  . REVERSE SHOULDER ARTHROPLASTY Left 09/09/2018   Procedure: REVERSE SHOULDER ARTHROPLASTY;  Surgeon: Justice Britain, MD;  Location: [place];  Service: Orthopedics;  Laterality: Left;  161min  . TOTAL HIP ARTHROPLASTY    . TUBAL LIGATION      There were no vitals filed for this  visit.  Subjective Assessment - 11/01/18 1511    Special Tests  FOTO Score: 19/100    Currently in Pain?  --   0/10 with no movement. 10/10 with passive movement.         [facility] OT Assessment - 11/01/18 1444      Assessment   Medical Diagnosis  Left reverse shoulder TSA      Precautions   Precautions  Shoulder    Type of Shoulder Precautions  Week 8-12: Progress A/ROM and P/ROM to tolerance. Begin sidelying exercises. May add tubing and gradual resistance to exercises.       Observation/Other Assessments   Focus on Therapeutic Outcomes (FOTO)   19/100               OT Treatments/Exercises (OP) - 11/01/18 1444      Exercises   Exercises  Shoulder      Shoulder Exercises: Supine   Flexion  PROM   3X     Modalities   Modalities  Moist Heat      Moist Heat Therapy   Number Minutes Moist Heat  10 Minutes    Moist Heat Location  Shoulder      Manual Therapy   Manual Therapy  Soft tissue mobilization    Manual therapy comments  Manual therapy completed prior to exercises.  Soft tissue mobilization  Myofascial release and manual stretching completed to left upper arm, trapezius, and scapularis region to decrease fascial restrictions and increase joint mobility in a pain free zone.                OT Short Term Goals - 11/01/18 1624      OT SHORT TERM GOAL #1   Title  Patient will be educated and independent with HEP in order to facilitate progress in therapy and increase functional use of LUE during daily tasks.     Time  4    Period  Weeks    Status  On-going    Target Date  11/23/18      OT SHORT TERM GOAL #2   Title  Pt will decrease LUE fascial restrictions from to minimal amounts or less to improve mobility required for functional reaching.     Time  4    Period  Weeks    Status  On-going      OT SHORT TERM GOAL #3   Title  Pt will decrease pain in LUE to 3/10 or less to improve completion of ADL tasks with minimal compensatory strategies.      Time  4    Period  Weeks    Status  On-going      OT SHORT TERM GOAL #4   Title  Pt will improve LUE ROM to [facility] to improve ability to reach behind back for seatbelts and fix her hair.    Time  4    Period  Weeks    Status  On-going      OT SHORT TERM GOAL #5   Title  Pt will improve LUE strength to 4/5 to increase ability to complete yardwork and housework tasks.     Time  4    Period  Weeks    Status  On-going               Plan - 11/01/18 1625    Clinical Impression Statement  A: Pt arrived to therapy session with reports that on Saturday evening, she attempted to apply deodorant to her right armpit using her left arm. During this task, she unsure if she felt a pop or not, she experienced pain and has been unable to use her arm and move it as before. During session, myofascial release was completed to address the increased fascial restrictions in her LUE. When therapist attempted to passively stretch her arm, patient immediately tensed up and began to cry complaining of the severe pain. Therapist was able to passively stretch shoulder flexion to approximately 90 degrees 1/3 trials. Therapist recommended termination of passive stretching and to apply moist heat to finish session. Patient was encouraged to call the MD and make an appointment to be seen. (Appointment was made for Wednesday at 1:30PM)    Plan  P: Follow up on MD appoinment. Attempt passive stretching and therapy ball stretches.     Consulted and Agree with Plan of Care  Patient       Patient will benefit from skilled therapeutic intervention in order to improve the following deficits and impairments:  Pain, Increased fascial restrictions, Decreased range of motion, Decreased strength, Impaired UE functional use, Decreased mobility  Visit Diagnosis: Stiffness of left shoulder, not elsewhere classified  Other symptoms and signs involving the musculoskeletal system  Acute pain of left shoulder    Problem  List Patient Active Problem List   Diagnosis Date Noted  . S/P  reverse total shoulder arthroplasty, left 09/09/2018  . Hypothyroidism, postop 11/19/2016  . Cyst in hand 08/23/2014  . Preventative health care 07/10/2014  . Attention deficit disorder 07/10/2014  . Obesity (BMI 30-39.9) 12/26/2013  . GERD (gastroesophageal reflux disease) 11/17/2012  . Depression with anxiety 10/28/2010  . INSOMNIA 08/07/2010  . ALLERGIC RHINITIS 11/30/2008  . OSTEOPENIA 03/10/2008  . Hyperlipidemia 04/19/2007  . Left carotid bruit 04/19/2007  . Essential hypertension 04/07/2007  . Osteoarthritis 04/07/2007   Ailene Ravel, OTR/L,CBIS  [phone]  11/01/2018, 4:30 PM  [place] [address] [place], [place], [address] Phone: [phone]   Fax:  [phone]  Name: [name] MRN: [id number] Date of Birth: Jan 26, 1941

## 2018-11-02 ENCOUNTER — Telehealth (HOSPITAL_COMMUNITY): Payer: Self-pay

## 2018-11-02 NOTE — Telephone Encounter (Signed)
Returned patient's phone call. She reports that his morning her arm is feeling so much better. She was able to complete the pendulum exercises and hold a cup of coffee with her left arm. She feels she will not need to keep her MD appointment on Wednesday. Recommended that patient complete her HEP and see how they feel. Patient verbalized understanding. Recommended using heat today as well.   Ailene Ravel, OTR/L,CBIS  [phone]

## 2018-11-03 ENCOUNTER — Ambulatory Visit (HOSPITAL_COMMUNITY): Payer: Medicare Other

## 2018-11-03 DIAGNOSIS — M25512 Pain in left shoulder: Secondary | ICD-10-CM | POA: Diagnosis not present

## 2018-11-03 DIAGNOSIS — Z96612 Presence of left artificial shoulder joint: Secondary | ICD-10-CM | POA: Diagnosis not present

## 2018-11-03 DIAGNOSIS — Z4789 Encounter for other orthopedic aftercare: Secondary | ICD-10-CM | POA: Diagnosis not present

## 2018-11-03 NOTE — Progress Notes (Signed)
Patient ID: [name], female   DOB: [birth year], 78 y.o.   MRN: [id number]    Reason for Appointment:  Follow-up of thyroid and possible hypercalcemia     History of Present Illness:   THYROID  Previous history: She had a low TSH level as far back as 7 years ago She had symptoms of feeling hot and sweaty with physical activity, some fatigue and palpitations She has had long history of anxiety and depression   Because of her symptoms and TSH in the hyperthyroid range as well as uptake of 34% she was given I-131 treatment with 30 mCi on 09/27/14. Subsequently she was subjectively better When TSH  was  as high as 15.9 she was started on 75 g levothyroxine in 12/2014  Recent history:  She had progressive increase in her levothyroxine dose since 2017 The dose was increased in 11/17 and also in 2/18, was eventually taking 137 g On her visit in 7/18 she was complaining of feeling consistently tired although has an occasional day where she feels better She also thinks she was losing hair, no cold intolerance   Since she had a relatively low  T3 level  she was switched from levothyroxine 137 to Cytomel 5 g along with levothyroxine 112 g in July 2018  With her new combination regimen she initially felt less tired overall and had somewhat less hair loss However subsequently was still complaining of fatigue and issues with depression and insomnia  Since her TSH was slightly higher than before and free T3 low normal she is now taking 7.5 mcg of Cytomel since 2/19 In October 2019 because of difficulty getting the tablets in half she was advised to take 1 tablet alternating with 2 and she thinks she is doing this  Overall she has not been feeling good but she says she has had several intermittent medical issues going on which make her feel bad including musculoskeletal problems and procedures The last few days she has felt better  Labs not available   Wt Readings from Last  3 Encounters:  11/04/18 171 lb 9.6 oz (77.8 kg)  09/09/18 172 lb 4.8 oz (78.2 kg)  09/02/18 172 lb 4.8 oz (78.2 kg)    Lab Results  Component Value Date   TSH 1.05 06/21/2018   TSH 3.22 10/14/2017   TSH 1.59 06/26/2017   FREET4 1.5 06/21/2018   FREET4 1.0 10/14/2017   FREET4 1.3 06/26/2017    Lab Results  Component Value Date   T3FREE 2.9 06/21/2018   T3FREE 2.7 10/14/2017   T3FREE 3.4 06/26/2017   T3FREE 2.8 04/10/2017   HYPERCALCEMIA: She thinks she was told by her PCP that her calcium level is high but no results available and unable to contact her PCP today Last calcium was upper normal and she has had only one high calcium in the past No history of osteoporosis, bone density done by PCP showed only osteopenia done in 02/2018  Lab Results  Component Value Date   CALCIUM 11.1 (H) 11/04/2018   CALCIUM 10.3 09/02/2018   CALCIUM 9.7 05/23/2016   CALCIUM 9.9 03/09/2015   CALCIUM 10.3 12/21/2014   CALCIUM 10.0 07/05/2014   CALCIUM 10.8 (H) 12/26/2013      Allergies as of 11/04/2018   No Active Allergies     Medication List       Accurate as of November 04, 2018  9:21 AM. Always use your most recent med list.  amphetamine-dextroamphetamine 30 MG tablet Commonly known as:  ADDERALL Take 1 tablet by mouth 2 (two) times daily. Fill in 2 months   aspirin-acetaminophen-caffeine 250-250-65 MG tablet Commonly known as:  EXCEDRIN MIGRAINE Take 1 tablet by mouth every 6 (six) hours as needed.   B COMPLEX PLUS Tabs Take 1 tablet by mouth daily.   buPROPion 300 MG 24 hr tablet Commonly known as:  WELLBUTRIN XL TAKE 1 TABLET BY MOUTH ONCE DAILY   calcium carbonate 500 MG chewable tablet Commonly known as:  TUMS - dosed in mg elemental calcium Chew 1 tablet by mouth daily.   calcium-vitamin D 500-200 MG-UNIT tablet Commonly known as:  OSCAL WITH D Take 1 tablet by mouth daily with breakfast.   diclofenac sodium 1 % Gel Commonly known as:  VOLTAREN APPLY    TOPICALLY TO AFFECTED AREA 4 TIMES DAILY AS NEEDED FOR PAIN   FLUoxetine 20 MG capsule Commonly known as:  PROZAC TAKE ONE CAPSULE BY MOUTH ONCE DAILY   hydrochlorothiazide 12.5 MG capsule Commonly known as:  MICROZIDE Take 12.5 mg by mouth daily. TAKE 1 TABLET BY MOUTH ONCE DAILY.   levothyroxine 112 MCG tablet Commonly known as:  SYNTHROID, LEVOTHROID TAKE 1 TABLET BY MOUTH ONCE DAILY BEFORE BREAKFAST   liothyronine 5 MCG tablet Commonly known as:  CYTOMEL Take 1.5 tablets (7.5 mcg total) by mouth daily.   losartan 100 MG tablet Commonly known as:  COZAAR Take 100 mg by mouth daily. TAKE 1 TABLET BY MOUTH ONCE DAILY.   multivitamin capsule Take 1 capsule by mouth daily. Centrum Silver   OVER THE COUNTER MEDICATION Take 1 tablet by mouth daily. Joint Support tablet includes:  Primal Celadrine turmeric   SYSTANE 0.4-0.3 % Soln Generic drug:  Polyethyl Glycol-Propyl Glycol Place 2 drops into both eyes 2 (two) times daily.   Vitamin D3 125 MCG (5000 UT) Caps Take 5,000 Units by mouth daily.       Allergies:  No Active Allergies  Past Medical History:  Diagnosis Date  . ALLERGIC RHINITIS 11/30/2008  . ALLERGIC RHINITIS 11/30/2008  . Anxiety   . ASTHMA UNSPECIFIED WITH EXACERBATION 02/17/2008   pt. states she does not and has never has asthma  . CAROTID BRUIT, LEFT 04/19/2007  . Closed fracture of lateral malleolus 06/04/2009  . CLOSED FRACTURE OF METATARSAL BONE 06/04/2009  . Depression   . Esophageal reflux 11/08/2008  . GANGLION CYST 04/19/2007  . HYPERTENSION 04/07/2007  . Hypothyroidism   . INSOMNIA 08/07/2010  . OSTEOARTHRITIS 04/07/2007  . OSTEOPENIA 03/10/2008  . PONV (postoperative nausea and vomiting)   . UTI 07/20/2009    Past Surgical History:  Procedure Laterality Date  . EYE SURGERY Bilateral    cataract removal  . FUNCTIONAL ENDOSCOPIC SINUS SURGERY  01/2018   spinal fluid leaking behind right eye  . JOINT REPLACEMENT     knuckle  . REVERSE  SHOULDER ARTHROPLASTY Left 09/09/2018  . REVERSE SHOULDER ARTHROPLASTY Left 09/09/2018   Procedure: REVERSE SHOULDER ARTHROPLASTY;  Surgeon: Justice Britain, MD;  Location: [place];  Service: Orthopedics;  Laterality: Left;  128min  . TOTAL HIP ARTHROPLASTY    . TUBAL LIGATION      Family History  Problem Relation Age of Onset  . Heart disease Mother        Mother also has significant carotid artery stenosis  . COPD Father   . Alcoholism Father   . Cancer Brother   . Alzheimer's disease Sister   . Thyroid disease Sister  Goiter  . Breast cancer Neg Hx     Social History:  reports that she quit smoking about 50 years ago. Her smoking use included cigarettes. She has a 30.00 pack-year smoking history. She has never used smokeless tobacco. She reports that she does not drink alcohol or use drugs.   Review of Systems:   ROS   Has complaints of late insomnia, better  She is on multiple treatment for depression/ADHD   Followed by PCP for hypertension  Blood pressure readings:   BP Readings from Last 3 Encounters:  11/04/18 (!) 160/90  09/10/18 126/64  09/02/18 (!) 164/84    Examination:   BP (!) 160/90 (BP Location: Right Arm, Patient Position: Sitting, Cuff Size: Normal)   Pulse 84   Ht 5\' 4"  (1.626 m)   Wt 171 lb 9.6 oz (77.8 kg)   SpO2 96%   BMI 29.46 kg/m    Thyroid not palpable Biceps reflexes appear normal   Assessment/Plan:    She has been on Levothyroxine supplementation for post ablative hypothyroidism, baseline TSH 16  Now with taking  112 g of levothyroxine and 5 g of Cytomel she is subjectively feeling better with less fatigue and better outlook TSH is normal and her free T3 level is improved No goiter on exam  She may be getting a little less hair loss also She does want to continue this regimen  She will follow-up in 4 months with repeat labs       total visit time 15 minutes  There are no Patient Instructions on file for this  visit.    influenza vaccine given   Elayne Snare 11/04/2018             Patient ID: [name], female   DOB: Nov 15, 1940, 78 y.o.   MRN: [id number]    Reason for Appointment:  Follow-up of thyroid     History of Present Illness:   Previous history: She had a low TSH level as far back as 7 years ago She had symptoms of feeling hot and sweaty with physical activity, some fatigue and palpitations She has had long history of anxiety and depression   Because of her symptoms and TSH in the hyperthyroid range as well as uptake of 34% she was given I-131 treatment with 30 mCi on 09/27/14. Subsequently she was subjectively better When TSH  was  as high as 15.9 she was started on 75 g levothyroxine in 12/2014  Recent history:  She had progressive increase in her levothyroxine dose since 2017 TSH was 5.4 in 5/17 and because of symptoms of fatigue her dose was increased to 100 g The dose was increased further in 11/17 and also in 2/18, was eventually taking 137 g  On her visit in 7/18 she was complaining of feeling consistently tired although has an occasional day where she feels better She also thinks she is losing hair, no cold intolerance   Since she had a relatively low  T3 level  she was switched from levothyroxine 137 to the Cytomel 5 g along with levothyroxine 112 g in July She is very compliant with taking her supplements before breakfast consistently in the morning  With her new regimen she says that she does feel less tired overall and maybe her hair loss is not as much Overall she thinks she is benefiting from the combination compared to levothyroxine alone even though her TSH is now 1.6 compared to low normal levels previously  Wt Readings from Last  3 Encounters:  11/04/18 171 lb 9.6 oz (77.8 kg)  09/09/18 172 lb 4.8 oz (78.2 kg)  09/02/18 172 lb 4.8 oz (78.2 kg)     Lab Results  Component Value Date   TSH 1.05 06/21/2018   TSH 3.22 10/14/2017   TSH 1.59  06/26/2017   FREET4 1.5 06/21/2018   FREET4 1.0 10/14/2017   FREET4 1.3 06/26/2017    Lab Results  Component Value Date   T3FREE 2.9 06/21/2018   T3FREE 2.7 10/14/2017   T3FREE 3.4 06/26/2017   T3FREE 2.8 04/10/2017      Allergies as of 11/04/2018   No Active Allergies     Medication List       Accurate as of November 04, 2018  9:21 AM. Always use your most recent med list.        amphetamine-dextroamphetamine 30 MG tablet Commonly known as:  ADDERALL Take 1 tablet by mouth 2 (two) times daily. Fill in 2 months   aspirin-acetaminophen-caffeine 250-250-65 MG tablet Commonly known as:  EXCEDRIN MIGRAINE Take 1 tablet by mouth every 6 (six) hours as needed.   B COMPLEX PLUS Tabs Take 1 tablet by mouth daily.   buPROPion 300 MG 24 hr tablet Commonly known as:  WELLBUTRIN XL TAKE 1 TABLET BY MOUTH ONCE DAILY   calcium carbonate 500 MG chewable tablet Commonly known as:  TUMS - dosed in mg elemental calcium Chew 1 tablet by mouth daily.   calcium-vitamin D 500-200 MG-UNIT tablet Commonly known as:  OSCAL WITH D Take 1 tablet by mouth daily with breakfast.   diclofenac sodium 1 % Gel Commonly known as:  VOLTAREN APPLY   TOPICALLY TO AFFECTED AREA 4 TIMES DAILY AS NEEDED FOR PAIN   FLUoxetine 20 MG capsule Commonly known as:  PROZAC TAKE ONE CAPSULE BY MOUTH ONCE DAILY   hydrochlorothiazide 12.5 MG capsule Commonly known as:  MICROZIDE Take 12.5 mg by mouth daily. TAKE 1 TABLET BY MOUTH ONCE DAILY.   levothyroxine 112 MCG tablet Commonly known as:  SYNTHROID, LEVOTHROID TAKE 1 TABLET BY MOUTH ONCE DAILY BEFORE BREAKFAST   liothyronine 5 MCG tablet Commonly known as:  CYTOMEL Take 1.5 tablets (7.5 mcg total) by mouth daily.   losartan 100 MG tablet Commonly known as:  COZAAR Take 100 mg by mouth daily. TAKE 1 TABLET BY MOUTH ONCE DAILY.   multivitamin capsule Take 1 capsule by mouth daily. Centrum Silver   OVER THE COUNTER MEDICATION Take 1 tablet  by mouth daily. Joint Support tablet includes:  Primal Celadrine turmeric   SYSTANE 0.4-0.3 % Soln Generic drug:  Polyethyl Glycol-Propyl Glycol Place 2 drops into both eyes 2 (two) times daily.   Vitamin D3 125 MCG (5000 UT) Caps Take 5,000 Units by mouth daily.       Allergies:  No Active Allergies  Past Medical History:  Diagnosis Date  . ALLERGIC RHINITIS 11/30/2008  . ALLERGIC RHINITIS 11/30/2008  . Anxiety   . ASTHMA UNSPECIFIED WITH EXACERBATION 02/17/2008   pt. states she does not and has never has asthma  . CAROTID BRUIT, LEFT 04/19/2007  . Closed fracture of lateral malleolus 06/04/2009  . CLOSED FRACTURE OF METATARSAL BONE 06/04/2009  . Depression   . Esophageal reflux 11/08/2008  . GANGLION CYST 04/19/2007  . HYPERTENSION 04/07/2007  . Hypothyroidism   . INSOMNIA 08/07/2010  . OSTEOARTHRITIS 04/07/2007  . OSTEOPENIA 03/10/2008  . PONV (postoperative nausea and vomiting)   . UTI 07/20/2009    Past Surgical History:  Procedure  Laterality Date  . EYE SURGERY Bilateral    cataract removal  . FUNCTIONAL ENDOSCOPIC SINUS SURGERY  01/2018   spinal fluid leaking behind right eye  . JOINT REPLACEMENT     knuckle  . REVERSE SHOULDER ARTHROPLASTY Left 09/09/2018  . REVERSE SHOULDER ARTHROPLASTY Left 09/09/2018   Procedure: REVERSE SHOULDER ARTHROPLASTY;  Surgeon: Justice Britain, MD;  Location: [place];  Service: Orthopedics;  Laterality: Left;  118min  . TOTAL HIP ARTHROPLASTY    . TUBAL LIGATION      Family History  Problem Relation Age of Onset  . Heart disease Mother        Mother also has significant carotid artery stenosis  . COPD Father   . Alcoholism Father   . Cancer Brother   . Alzheimer's disease Sister   . Thyroid disease Sister        Goiter  . Breast cancer Neg Hx     Social History:  reports that she quit smoking about 50 years ago. Her smoking use included cigarettes. She has a 30.00 pack-year smoking history. She has never used smokeless tobacco.  She reports that she does not drink alcohol or use drugs.   Review of Systems:   ROS    She is on multiple treatment for depression/ADHD   Followed by PCP for hypertension  Blood pressure readings:   BP Readings from Last 3 Encounters:  11/04/18 (!) 160/90  09/10/18 126/64  09/02/18 (!) 164/84    Examination:   BP (!) 160/90 (BP Location: Right Arm, Patient Position: Sitting, Cuff Size: Normal)   Pulse 84   Ht 5\' 4"  (1.626 m)   Wt 171 lb 9.6 oz (77.8 kg)   SpO2 96%   BMI 29.46 kg/m      Assessment/Plan:    She has ablative hypothyroidism, baseline TSH 16  She is here for periodic follow-up Currently taking Cytomel 5 mcg, 1 tablet alternating with 2 along with 112 levothyroxine Difficult to assess her symptoms since she has had intercurrent medical problems and she thinks these make her feel tired   HYPERCALCEMIA: She reportedly has increased calcium by her PCP but no results available not clear if this is what she is being referred here for We will check calcium and PTH level and discuss options She does not have osteoporosis or nephrolithiasis and unlikely that she needs parathyroid exploration She was spent in nature of hyperparathyroidism, natural history and potential adverse effects of this   There are no Patient Instructions on file for this visit.    Elayne Snare 11/04/2018

## 2018-11-04 ENCOUNTER — Encounter (HOSPITAL_COMMUNITY): Payer: Self-pay

## 2018-11-04 ENCOUNTER — Encounter: Payer: Self-pay | Admitting: Endocrinology

## 2018-11-04 ENCOUNTER — Ambulatory Visit (INDEPENDENT_AMBULATORY_CARE_PROVIDER_SITE_OTHER): Payer: Medicare Other | Admitting: Endocrinology

## 2018-11-04 ENCOUNTER — Ambulatory Visit (HOSPITAL_COMMUNITY): Payer: Medicare Other

## 2018-11-04 VITALS — BP 160/90 | HR 84 | Ht 64.0 in | Wt 171.6 lb

## 2018-11-04 DIAGNOSIS — E890 Postprocedural hypothyroidism: Secondary | ICD-10-CM

## 2018-11-04 DIAGNOSIS — M25612 Stiffness of left shoulder, not elsewhere classified: Secondary | ICD-10-CM

## 2018-11-04 DIAGNOSIS — R29898 Other symptoms and signs involving the musculoskeletal system: Secondary | ICD-10-CM | POA: Diagnosis not present

## 2018-11-04 DIAGNOSIS — M25512 Pain in left shoulder: Secondary | ICD-10-CM

## 2018-11-04 LAB — BASIC METABOLIC PANEL
BUN: 19 mg/dL (ref 6–23)
CO2: 32 mEq/L (ref 19–32)
Calcium: 11.1 mg/dL — ABNORMAL HIGH (ref 8.4–10.5)
Chloride: 99 mEq/L (ref 96–112)
Creatinine, Ser: 1.02 mg/dL (ref 0.40–1.20)
GFR: 52.3 mL/min — AB (ref >60.00)
Glucose, Bld: 91 mg/dL (ref 70–99)
Potassium: 3.7 mEq/L (ref 3.5–5.1)
Sodium: 138 mEq/L (ref 135–145)

## 2018-11-04 LAB — T3, FREE: T3 FREE: 3 pg/mL (ref 2.3–4.2)

## 2018-11-04 LAB — TSH: TSH: 4.43 u[IU]/mL (ref 0.35–4.50)

## 2018-11-04 LAB — T4, FREE: Free T4: 0.98 ng/dL (ref 0.60–1.60)

## 2018-11-04 NOTE — Therapy (Signed)
[facility] [place], [place], [address] Phone: [phone]   Fax:  [phone]  Occupational Therapy Treatment And discharge Patient Details  Name: [name] MRN: [id number] Date of Birth: 05-Nov-1940 Referring Provider (OT): Justice Britain, MD   Encounter Date: 11/04/2018  OT End of Session - 11/04/18 1542    Visit Number  3    Number of Visits  8    Authorization Type  1) medicare 2) [employer] supplemental    Authorization Time Period  No visit limit    OT Start Time  1435   Discharge   OT Stop Time  1510    OT Time Calculation (min)  35 min    Activity Tolerance  Patient tolerated treatment well    Behavior During Therapy  [facility] for tasks assessed/performed       Past Medical History:  Diagnosis Date  . ALLERGIC RHINITIS 11/30/2008  . ALLERGIC RHINITIS 11/30/2008  . Anxiety   . ASTHMA UNSPECIFIED WITH EXACERBATION 02/17/2008   pt. states she does not and has never has asthma  . CAROTID BRUIT, LEFT 04/19/2007  . Closed fracture of lateral malleolus 06/04/2009  . CLOSED FRACTURE OF METATARSAL BONE 06/04/2009  . Depression   . Esophageal reflux 11/08/2008  . GANGLION CYST 04/19/2007  . HYPERTENSION 04/07/2007  . Hypothyroidism   . INSOMNIA 08/07/2010  . OSTEOARTHRITIS 04/07/2007  . OSTEOPENIA 03/10/2008  . PONV (postoperative nausea and vomiting)   . UTI 07/20/2009    Past Surgical History:  Procedure Laterality Date  . EYE SURGERY Bilateral    cataract removal  . FUNCTIONAL ENDOSCOPIC SINUS SURGERY  01/2018   spinal fluid leaking behind right eye  . JOINT REPLACEMENT     knuckle  . REVERSE SHOULDER ARTHROPLASTY Left 09/09/2018  . REVERSE SHOULDER ARTHROPLASTY Left 09/09/2018   Procedure: REVERSE SHOULDER ARTHROPLASTY;  Surgeon: Justice Britain, MD;  Location: [place];  Service: Orthopedics;  Laterality: Left;  167mn  . TOTAL HIP ARTHROPLASTY    . TUBAL LIGATION      There were no vitals filed for this  visit.  Subjective Assessment - 11/04/18 1540    Subjective   S: The doctor said I have a fracture. I'm supposed to stop therapy. Could you do the massage and heat one more time though?    Currently in Pain?  No/denies         [facility] - 11/04[facility]18 1541      Assessment   Medical Diagnosis  Left reverse shoulder TSA      Precautions   Precautions  Shoulder    Type of Shoulder Precautions  Week 8-12: Progress A/ROM and P/ROM to tolerance. Begin sidelying exercises. May add tubing and gradual resistance to exercises.                OT Treatments/Exercises (OP) - 11/04/18 1541      Modalities   Modalities  Moist Heat      Moist Heat Therapy   Number Minutes Moist Heat  10 Minutes    Moist Heat Location  Shoulder      Manual Therapy   Manual Therapy  Soft tissue mobilization    Manual therapy comments  Manual therapy completed prior to exercises.     Soft tissue mobilization  Myofascial release completed to left upper arm, trapezius, and scapularis region to decrease fascial restrictions.             OT Education - 11/04/18  [place]    Education Details  wrist and elbow A/ROM exercises. stress ball for grip. print out of heat packs from [employer].     Person(s) Educated  Patient    Methods  Explanation;Handout    Comprehension  Verbalized understanding       OT Short Term Goals - 11/04/18 1546      OT SHORT TERM GOAL #1   Title  Patient will be educated and independent with HEP in order to facilitate progress in therapy and increase functional use of LUE during daily tasks.     Time  4    Period  Weeks    Status  Not Met    Target Date  11/23/18      OT SHORT TERM GOAL #2   Title  Pt will decrease LUE fascial restrictions from to minimal amounts or less to improve mobility required for functional reaching.     Time  4    Period  Weeks    Status  Not Met      OT SHORT TERM GOAL #3   Title  Pt will decrease pain in LUE to 3/10 or less to improve  completion of ADL tasks with minimal compensatory strategies.     Time  4    Period  Weeks    Status  Not Met      OT SHORT TERM GOAL #4   Title  Pt will improve LUE ROM to [facility] to improve ability to reach behind back for seatbelts and fix her hair.    Time  4    Period  Weeks    Status  Not Met      OT SHORT TERM GOAL #5   Title  Pt will improve LUE strength to 4/5 to increase ability to complete yardwork and housework tasks.     Time  4    Period  Weeks    Status  Not Met               Plan - 11/04/18 1543    Clinical Impression Statement  A: Patient reports that she went to the MD and the X-rays showed post operative acromial fracture. She is placed in a sling for 4 weeks with instructions to stop therapy. Patient requested manual therapy to shoulder and moist heat pack prior to discharge as it assisted with pain management. Patient will max fascial restrictions palpated in left upper shoulder region with good response to manual techniques. Pt was educated on precautions of no use of LUE. She was provided with wrist and elbow appropriate exercises and a stress ball for grip. She verbalized understanding. Patient will be discharged per MD's orders.     Plan  P: D/C from OT services this date per MD request due to recent fracture. patient will be referred to therapy when appropriate.     Consulted and Agree with Plan of Care  Patient       Patient will benefit from skilled therapeutic intervention in order to improve the following deficits and impairments:  Pain, Increased fascial restrictions, Decreased range of motion, Decreased strength, Impaired UE functional use, Decreased mobility  Visit Diagnosis: Stiffness of left shoulder, not elsewhere classified  Other symptoms and signs involving the musculoskeletal system  Acute pain of left shoulder    Problem List Patient Active Problem List   Diagnosis Date Noted  . S/P reverse total shoulder arthroplasty, left  09/09/2018  . Hypothyroidism, postop 11/19/2016  . Cyst in hand 08/23/2014  .  Preventative health care 07/10/2014  . Attention deficit disorder 07/10/2014  . Obesity (BMI 30-39.9) 12/26/2013  . GERD (gastroesophageal reflux disease) 11/17/2012  . Depression with anxiety 10/28/2010  . INSOMNIA 08/07/2010  . ALLERGIC RHINITIS 11/30/2008  . OSTEOPENIA 03/10/2008  . Hyperlipidemia 04/19/2007  . Left carotid bruit 04/19/2007  . Essential hypertension 04/07/2007  . Osteoarthritis 04/07/2007   OCCUPATIONAL THERAPY DISCHARGE SUMMARY  Visits from Start of Care: 3  Current functional level related to goals / functional outcomes: See above   Remaining deficits: See above   Education / Equipment: See above Plan: Patient agrees to discharge.  Patient goals were not met. Patient is being discharged due to the physician's request.  ?????         Ailene Ravel, OTR/L,CBIS  [phone]  11/04/2018, 3:47 PM  [place], [place], [address] Phone: [phone]   Fax:  [phone]  Name: SHERICA PATERNOSTRO MRN: [id number] Date of Birth: September 13, 1941

## 2018-11-04 NOTE — Patient Instructions (Signed)
AROM: Wrist Flexion / Extension  Actively bend right wrist forward then back as far as possible. Repeat __10__ times per set. Do __1__ sets per session. Do _2-3___ sessions per day.  Copyright  VHI. All rights reserved.  AROM: Wrist Radial / Ulnar Deviation   Copyright  VHI. All rights reserved.  AROM: Forearm Pronation / Supination   With right arm in handshake position, slowly rotate palm down until stretch is felt. Relax. Then rotate palm up until stretch is felt. Repeat _10___ times per set. Do _1___ sets per session. Do __2-3__ sessions per day.  Copyright  VHI. All rights reserved.    ELBOW FLEXION EXTENSION  Bend your elbow upwards as shown and then lower to a straighten position.10 times per set. 1 set. 2-3 times a day.

## 2018-11-05 LAB — PARATHYROID HORMONE, INTACT (NO CA): PTH: 22 pg/mL (ref 15–65)

## 2018-11-07 NOTE — Progress Notes (Signed)
Please call to let patient know that the thyroid levels are normal, calcium is slightly high but with a normal parathyroid hormone.  Is her hydrochlorothiazide a new medication, this can cause increased calcium

## 2018-11-08 ENCOUNTER — Ambulatory Visit (HOSPITAL_COMMUNITY): Payer: Medicare Other | Admitting: Specialist

## 2018-11-10 ENCOUNTER — Encounter (HOSPITAL_COMMUNITY): Payer: Self-pay | Admitting: Occupational Therapy

## 2018-11-15 ENCOUNTER — Encounter (HOSPITAL_COMMUNITY): Payer: Self-pay

## 2018-11-17 ENCOUNTER — Encounter (HOSPITAL_COMMUNITY): Payer: Self-pay

## 2018-11-22 ENCOUNTER — Encounter (HOSPITAL_COMMUNITY): Payer: Self-pay

## 2018-11-24 ENCOUNTER — Encounter (HOSPITAL_COMMUNITY): Payer: Self-pay

## 2018-11-26 DIAGNOSIS — R300 Dysuria: Secondary | ICD-10-CM | POA: Diagnosis not present

## 2018-11-26 DIAGNOSIS — J302 Other seasonal allergic rhinitis: Secondary | ICD-10-CM | POA: Diagnosis not present

## 2018-11-26 DIAGNOSIS — M545 Low back pain: Secondary | ICD-10-CM | POA: Diagnosis not present

## 2018-11-26 DIAGNOSIS — R51 Headache: Secondary | ICD-10-CM | POA: Diagnosis not present

## 2018-11-26 DIAGNOSIS — J029 Acute pharyngitis, unspecified: Secondary | ICD-10-CM | POA: Diagnosis not present

## 2018-11-26 DIAGNOSIS — H538 Other visual disturbances: Secondary | ICD-10-CM | POA: Diagnosis not present

## 2018-11-26 DIAGNOSIS — G960 Cerebrospinal fluid leak: Secondary | ICD-10-CM | POA: Diagnosis not present

## 2018-11-26 DIAGNOSIS — I10 Essential (primary) hypertension: Secondary | ICD-10-CM | POA: Diagnosis not present

## 2018-11-26 DIAGNOSIS — E039 Hypothyroidism, unspecified: Secondary | ICD-10-CM | POA: Diagnosis not present

## 2018-11-26 DIAGNOSIS — F331 Major depressive disorder, recurrent, moderate: Secondary | ICD-10-CM | POA: Diagnosis not present

## 2018-11-26 DIAGNOSIS — M8580 Other specified disorders of bone density and structure, unspecified site: Secondary | ICD-10-CM | POA: Diagnosis not present

## 2018-11-26 DIAGNOSIS — E782 Mixed hyperlipidemia: Secondary | ICD-10-CM | POA: Diagnosis not present

## 2018-11-29 DIAGNOSIS — Z96612 Presence of left artificial shoulder joint: Secondary | ICD-10-CM | POA: Diagnosis not present

## 2018-11-29 DIAGNOSIS — Z471 Aftercare following joint replacement surgery: Secondary | ICD-10-CM | POA: Diagnosis not present

## 2018-12-22 ENCOUNTER — Ambulatory Visit: Payer: Self-pay | Admitting: Endocrinology

## 2019-02-25 DIAGNOSIS — E039 Hypothyroidism, unspecified: Secondary | ICD-10-CM | POA: Diagnosis not present

## 2019-02-25 DIAGNOSIS — I10 Essential (primary) hypertension: Secondary | ICD-10-CM | POA: Diagnosis not present

## 2019-02-25 DIAGNOSIS — M1380 Other specified arthritis, unspecified site: Secondary | ICD-10-CM | POA: Diagnosis not present

## 2019-02-25 DIAGNOSIS — F902 Attention-deficit hyperactivity disorder, combined type: Secondary | ICD-10-CM | POA: Diagnosis not present

## 2019-03-23 DIAGNOSIS — J3489 Other specified disorders of nose and nasal sinuses: Secondary | ICD-10-CM | POA: Diagnosis not present

## 2019-03-23 DIAGNOSIS — Z9889 Other specified postprocedural states: Secondary | ICD-10-CM | POA: Diagnosis not present

## 2019-03-23 DIAGNOSIS — Z48810 Encounter for surgical aftercare following surgery on the sense organs: Secondary | ICD-10-CM | POA: Diagnosis not present

## 2019-04-29 ENCOUNTER — Telehealth: Payer: Self-pay | Admitting: Endocrinology

## 2019-04-29 ENCOUNTER — Other Ambulatory Visit: Payer: Self-pay | Admitting: Endocrinology

## 2019-04-29 DIAGNOSIS — E890 Postprocedural hypothyroidism: Secondary | ICD-10-CM

## 2019-04-29 NOTE — Telephone Encounter (Signed)
Are the labs you ordered in February all you want at this time?

## 2019-04-29 NOTE — Telephone Encounter (Signed)
New labs have been ordered, please fax

## 2019-04-29 NOTE — Telephone Encounter (Signed)
New orders were printed and faxing.

## 2019-04-29 NOTE — Telephone Encounter (Signed)
Patient has scheduled an appointment for Thursday 05/05/2019.She would like to have her labs done at at [facility] in [place]. Fax Number is [phone]  Please let her know when labs are sent so she know when she can go for her appointment

## 2019-05-03 ENCOUNTER — Other Ambulatory Visit: Payer: Self-pay | Admitting: Endocrinology

## 2019-05-03 DIAGNOSIS — E890 Postprocedural hypothyroidism: Secondary | ICD-10-CM | POA: Diagnosis not present

## 2019-05-03 LAB — BASIC METABOLIC PANEL
BUN/Creatinine Ratio: 19 (calc) (ref 6–22)
BUN: 21 mg/dL (ref 7–25)
CO2: 29 mmol/L (ref 20–32)
Calcium: 10.8 mg/dL — ABNORMAL HIGH (ref 8.6–10.4)
Chloride: 100 mmol/L (ref 98–110)
Creat: 1.1 mg/dL — ABNORMAL HIGH (ref 0.60–0.93)
Glucose, Bld: 110 mg/dL — ABNORMAL HIGH (ref 65–99)
Potassium: 4.1 mmol/L (ref 3.5–5.3)
Sodium: 138 mmol/L (ref 135–146)

## 2019-05-03 LAB — T3, FREE: T3, Free: 2.8 pg/mL (ref 2.3–4.2)

## 2019-05-03 LAB — TSH: TSH: 1.68 mIU/L (ref 0.40–4.50)

## 2019-05-03 LAB — T4, FREE: Free T4: 1.3 ng/dL (ref 0.8–1.8)

## 2019-05-05 ENCOUNTER — Encounter: Payer: Self-pay | Admitting: Endocrinology

## 2019-05-05 ENCOUNTER — Other Ambulatory Visit: Payer: Self-pay

## 2019-05-05 ENCOUNTER — Ambulatory Visit (INDEPENDENT_AMBULATORY_CARE_PROVIDER_SITE_OTHER): Payer: Medicare Other | Admitting: Endocrinology

## 2019-05-05 DIAGNOSIS — E890 Postprocedural hypothyroidism: Secondary | ICD-10-CM | POA: Diagnosis not present

## 2019-05-05 NOTE — Progress Notes (Signed)
Patient ID: [name], female   DOB: 10/14/1940, 78 y.o.   MRN: [id number]    Reason for Appointment:  Follow-up of thyroid and possible hypercalcemia   Today's office visit was provided via telemedicine using video technique The patient was explained the limitations of evaluation and management by telemedicine and the availability of in person appointments.  The patient understood the limitations and agreed to proceed. Patient also understood that the telehealth visit is billable. . Location of the patient: Patient's home . Location of the provider: Physician office Only the patient and myself were participating in the encounter      History of Present Illness:   THYROID  Previous history: She had a low TSH level as far back as 7 years ago She had symptoms of feeling hot and sweaty with physical activity, some fatigue and palpitations She has had long history of anxiety and depression   Because of her symptoms and TSH in the hyperthyroid range as well as uptake of 34% she was given I-131 treatment with 30 mCi on 09/27/14. Subsequently she was subjectively better When TSH  was  as high as 15.9 she was started on 75 g levothyroxine in 12/2014  Recent history:  She had progressive increase in her levothyroxine dose since 2017 The dose was increased in 11/17 and also in 2/18, was eventually taking 137 g On her visit in 7/18 she was complaining of feeling consistently tired although has an occasional day where she feels better She also thinks she was losing hair, no cold intolerance   Since she had a relatively low  T3 level  she was switched from levothyroxine 137 to Cytomel 5 g along with levothyroxine 112 g in July 2018  With her new combination regimen she initially felt less tired overall and had somewhat less hair loss However subsequently was still complaining of fatigue and issues with depression and insomnia  When her TSH was slightly higher and free  T3 low normal in 10/2017 she was told to increase her Cytomel She is now taking 1 tablet alternating with 2 of the liothyronine  She thinks she is feeling the same on the days each she takes 2 tablets compared to 1 tablet on the alternate day  Now she thinks that she is having worsening of her fatigue Also has had some hair loss again She has continued problems with significant insomnia, last night did not sleep until 3 AM She has been quite regular with taking her thyroid supplements in the morning   Wt Readings from Last 3 Encounters:  11/04/18 171 lb 9.6 oz (77.8 kg)  09/09/18 172 lb 4.8 oz (78.2 kg)  09/02/18 172 lb 4.8 oz (78.2 kg)    Lab Results  Component Value Date   TSH 1.68 05/03/2019   TSH 4.43 11/04/2018   TSH 1.05 06/21/2018   FREET4 1.3 05/03/2019   FREET4 0.98 11/04/2018   FREET4 1.5 06/21/2018    Lab Results  Component Value Date   T3FREE 2.8 05/03/2019   T3FREE 3.0 11/04/2018   T3FREE 2.9 06/21/2018   T3FREE 2.7 10/14/2017   HYPERCALCEMIA: She has been told by her PCP that her calcium level is high but no results available   Last calcium was relatively higher However PTH level was not high She still takes HCTZ She does not take any calcium supplements and is on 5000 units of vitamin D3  No history of osteoporosis, bone density done by PCP showed only osteopenia done  in 02/2018 with lowest T score -1.9  Lab Results  Component Value Date   CALCIUM 10.8 (H) 05/03/2019   CALCIUM 11.1 (H) 11/04/2018   CALCIUM 10.3 09/02/2018   CALCIUM 9.7 05/23/2016   CALCIUM 9.9 03/09/2015   CALCIUM 10.3 12/21/2014   CALCIUM 10.0 07/05/2014   Lab Results  Component Value Date   PTH 22 11/04/2018   CALCIUM 10.8 (H) 05/03/2019      Allergies as of 05/05/2019   No Active Allergies     Medication List       Accurate as of May 05, 2019  4:41 PM. If you have any questions, ask your nurse or doctor.        amphetamine-dextroamphetamine 30 MG tablet  Commonly known as: Adderall Take 1 tablet by mouth 2 (two) times daily. Fill in 2 months What changed: additional instructions   aspirin-acetaminophen-caffeine 250-250-65 MG tablet Commonly known as: EXCEDRIN MIGRAINE Take 1 tablet by mouth every 6 (six) hours as needed. What changed:   when to take this  reasons to take this   B Complex Plus Tabs Take 1 tablet by mouth daily.   buPROPion 300 MG 24 hr tablet Commonly known as: WELLBUTRIN XL TAKE 1 TABLET BY MOUTH ONCE DAILY   calcium carbonate 500 MG chewable tablet Commonly known as: TUMS - dosed in mg elemental calcium Chew 1 tablet by mouth daily.   calcium-vitamin D 500-200 MG-UNIT tablet Commonly known as: OSCAL WITH D Take 1 tablet by mouth daily with breakfast.   diclofenac sodium 1 % Gel Commonly known as: Voltaren APPLY   TOPICALLY TO AFFECTED AREA 4 TIMES DAILY AS NEEDED FOR PAIN What changed:   how much to take  how to take this  when to take this  reasons to take this  additional instructions   FLUoxetine 20 MG capsule Commonly known as: PROZAC TAKE ONE CAPSULE BY MOUTH ONCE DAILY   hydrochlorothiazide 12.5 MG capsule Commonly known as: MICROZIDE Take 12.5 mg by mouth daily. TAKE 1 TABLET BY MOUTH ONCE DAILY.   levothyroxine 112 MCG tablet Commonly known as: SYNTHROID TAKE 1 TABLET BY MOUTH ONCE DAILY BEFORE BREAKFAST What changed:   how much to take  how to take this  when to take this  additional instructions   liothyronine 5 MCG tablet Commonly known as: CYTOMEL Take 1.5 tablets (7.5 mcg total) by mouth daily.   losartan 100 MG tablet Commonly known as: COZAAR Take 100 mg by mouth daily. TAKE 1 TABLET BY MOUTH ONCE DAILY.   multivitamin capsule Take 1 capsule by mouth daily. Centrum Silver   OVER THE COUNTER MEDICATION Take 1 tablet by mouth daily. Joint Support tablet includes:  Primal Celadrine turmeric   Systane 0.4-0.3 % Soln Generic drug: Polyethyl Glycol-Propyl  Glycol Place 2 drops into both eyes 2 (two) times daily.   Vitamin D3 125 MCG (5000 UT) Caps Take 5,000 Units by mouth daily.       Allergies:  No Active Allergies  Past Medical History:  Diagnosis Date  . ALLERGIC RHINITIS 11/30/2008  . ALLERGIC RHINITIS 11/30/2008  . Anxiety   . ASTHMA UNSPECIFIED WITH EXACERBATION 02/17/2008   pt. states she does not and has never has asthma  . CAROTID BRUIT, LEFT 04/19/2007  . Closed fracture of lateral malleolus 06/04/2009  . CLOSED FRACTURE OF METATARSAL BONE 06/04/2009  . Depression   . Esophageal reflux 11/08/2008  . GANGLION CYST 04/19/2007  . HYPERTENSION 04/07/2007  . Hypothyroidism   . INSOMNIA  08/07/2010  . OSTEOARTHRITIS 04/07/2007  . OSTEOPENIA 03/10/2008  . PONV (postoperative nausea and vomiting)   . UTI 07/20/2009    Past Surgical History:  Procedure Laterality Date  . EYE SURGERY Bilateral    cataract removal  . FUNCTIONAL ENDOSCOPIC SINUS SURGERY  01/2018   spinal fluid leaking behind right eye  . JOINT REPLACEMENT     knuckle  . REVERSE SHOULDER ARTHROPLASTY Left 09/09/2018  . REVERSE SHOULDER ARTHROPLASTY Left 09/09/2018   Procedure: REVERSE SHOULDER ARTHROPLASTY;  Surgeon: Justice Britain, MD;  Location: [facility];  Service: Orthopedics;  Laterality: Left;  157min  . TOTAL HIP ARTHROPLASTY    . TUBAL LIGATION      Family History  Problem Relation Age of Onset  . Heart disease Mother        Mother also has significant carotid artery stenosis  . COPD Father   . Alcoholism Father   . Cancer Brother   . Alzheimer's disease Sister   . Thyroid disease Sister        Goiter  . Breast cancer Neg Hx     Social History:  reports that she quit smoking about 50 years ago. Her smoking use included cigarettes. She has a 30.00 pack-year smoking history. She has never used smokeless tobacco. She reports that she does not drink alcohol or use drugs.   Review of Systems:   ROS   Has complaints of late insomnia, better  She is on  multiple treatment for depression/ADHD   Followed by PCP for hypertension  Blood pressure readings:   BP Readings from Last 3 Encounters:  11/04/18 (!) 160/90  09/10/18 126/64  09/02/18 (!) 164/84    Examination:   There were no vitals taken for this visit.   Thyroid not palpable Biceps reflexes appear normal   Assessment/Plan:    She has been on Levothyroxine supplementation for post ablative hypothyroidism, baseline TSH 16  Now with taking  112 g of levothyroxine and 5 g of Cytomel she is subjectively feeling better with less fatigue and better outlook TSH is normal and her free T3 level is improved No goiter on exam  She may be getting a little less hair loss also She does want to continue this regimen  She will follow-up in 4 months with repeat labs       total visit time 15 minutes  There are no Patient Instructions on file for this visit.    influenza vaccine given   Elayne Snare 05/05/2019             Patient ID: [name], female   DOB: 19-Feb-1941, 78 y.o.   MRN: [id number]    Reason for Appointment:  Follow-up of thyroid     History of Present Illness:   Previous history: She had a low TSH level as far back as 7 years ago She had symptoms of feeling hot and sweaty with physical activity, some fatigue and palpitations She has had long history of anxiety and depression   Because of her symptoms and TSH in the hyperthyroid range as well as uptake of 34% she was given I-131 treatment with 30 mCi on 09/27/14. Subsequently she was subjectively better When TSH  was  as high as 15.9 she was started on 75 g levothyroxine in 12/2014  Recent history:  She had progressive increase in her levothyroxine dose since 2017 TSH was 5.4 in 5/17 and because of symptoms of fatigue her dose was increased to 100 g The dose  was increased further in 11/17 and also in 2/18, was eventually taking 137 g  On her visit in 7/18 she was complaining of feeling  consistently tired although has an occasional day where she feels better She also thinks she is losing hair, no cold intolerance   Since she had a relatively low  T3 level  she was switched from levothyroxine 137 to the Cytomel 5 g along with levothyroxine 112 g in July She is very compliant with taking her supplements before breakfast consistently in the morning  With her new regimen she says that she does feel less tired overall and maybe her hair loss is not as much Overall she thinks she is benefiting from the combination compared to levothyroxine alone even though her TSH is now 1.6 compared to low normal levels previously  Wt Readings from Last 3 Encounters:  11/04/18 171 lb 9.6 oz (77.8 kg)  09/09/18 172 lb 4.8 oz (78.2 kg)  09/02/18 172 lb 4.8 oz (78.2 kg)     Lab Results  Component Value Date   TSH 1.68 05/03/2019   TSH 4.43 11/04/2018   TSH 1.05 06/21/2018   FREET4 1.3 05/03/2019   FREET4 0.98 11/04/2018   FREET4 1.5 06/21/2018    Lab Results  Component Value Date   T3FREE 2.8 05/03/2019   T3FREE 3.0 11/04/2018   T3FREE 2.9 06/21/2018   T3FREE 2.7 10/14/2017      Allergies as of 05/05/2019   No Active Allergies     Medication List       Accurate as of May 05, 2019  4:41 PM. If you have any questions, ask your nurse or doctor.        amphetamine-dextroamphetamine 30 MG tablet Commonly known as: Adderall Take 1 tablet by mouth 2 (two) times daily. Fill in 2 months What changed: additional instructions   aspirin-acetaminophen-caffeine 250-250-65 MG tablet Commonly known as: EXCEDRIN MIGRAINE Take 1 tablet by mouth every 6 (six) hours as needed. What changed:   when to take this  reasons to take this   B Complex Plus Tabs Take 1 tablet by mouth daily.   buPROPion 300 MG 24 hr tablet Commonly known as: WELLBUTRIN XL TAKE 1 TABLET BY MOUTH ONCE DAILY   calcium carbonate 500 MG chewable tablet Commonly known as: TUMS - dosed in mg elemental  calcium Chew 1 tablet by mouth daily.   calcium-vitamin D 500-200 MG-UNIT tablet Commonly known as: OSCAL WITH D Take 1 tablet by mouth daily with breakfast.   diclofenac sodium 1 % Gel Commonly known as: Voltaren APPLY   TOPICALLY TO AFFECTED AREA 4 TIMES DAILY AS NEEDED FOR PAIN What changed:   how much to take  how to take this  when to take this  reasons to take this  additional instructions   FLUoxetine 20 MG capsule Commonly known as: PROZAC TAKE ONE CAPSULE BY MOUTH ONCE DAILY   hydrochlorothiazide 12.5 MG capsule Commonly known as: MICROZIDE Take 12.5 mg by mouth daily. TAKE 1 TABLET BY MOUTH ONCE DAILY.   levothyroxine 112 MCG tablet Commonly known as: SYNTHROID TAKE 1 TABLET BY MOUTH ONCE DAILY BEFORE BREAKFAST What changed:   how much to take  how to take this  when to take this  additional instructions   liothyronine 5 MCG tablet Commonly known as: CYTOMEL Take 1.5 tablets (7.5 mcg total) by mouth daily.   losartan 100 MG tablet Commonly known as: COZAAR Take 100 mg by mouth daily. TAKE 1 TABLET BY  MOUTH ONCE DAILY.   multivitamin capsule Take 1 capsule by mouth daily. Centrum Silver   OVER THE COUNTER MEDICATION Take 1 tablet by mouth daily. Joint Support tablet includes:  Primal Celadrine turmeric   Systane 0.4-0.3 % Soln Generic drug: Polyethyl Glycol-Propyl Glycol Place 2 drops into both eyes 2 (two) times daily.   Vitamin D3 125 MCG (5000 UT) Caps Take 5,000 Units by mouth daily.       Allergies:  No Active Allergies  Past Medical History:  Diagnosis Date  . ALLERGIC RHINITIS 11/30/2008  . ALLERGIC RHINITIS 11/30/2008  . Anxiety   . ASTHMA UNSPECIFIED WITH EXACERBATION 02/17/2008   pt. states she does not and has never has asthma  . CAROTID BRUIT, LEFT 04/19/2007  . Closed fracture of lateral malleolus 06/04/2009  . CLOSED FRACTURE OF METATARSAL BONE 06/04/2009  . Depression   . Esophageal reflux 11/08/2008  . GANGLION CYST  04/19/2007  . HYPERTENSION 04/07/2007  . Hypothyroidism   . INSOMNIA 08/07/2010  . OSTEOARTHRITIS 04/07/2007  . OSTEOPENIA 03/10/2008  . PONV (postoperative nausea and vomiting)   . UTI 07/20/2009    Past Surgical History:  Procedure Laterality Date  . EYE SURGERY Bilateral    cataract removal  . FUNCTIONAL ENDOSCOPIC SINUS SURGERY  01/2018   spinal fluid leaking behind right eye  . JOINT REPLACEMENT     knuckle  . REVERSE SHOULDER ARTHROPLASTY Left 09/09/2018  . REVERSE SHOULDER ARTHROPLASTY Left 09/09/2018   Procedure: REVERSE SHOULDER ARTHROPLASTY;  Surgeon: Justice Britain, MD;  Location: [facility];  Service: Orthopedics;  Laterality: Left;  156min  . TOTAL HIP ARTHROPLASTY    . TUBAL LIGATION      Family History  Problem Relation Age of Onset  . Heart disease Mother        Mother also has significant carotid artery stenosis  . COPD Father   . Alcoholism Father   . Cancer Brother   . Alzheimer's disease Sister   . Thyroid disease Sister        Goiter  . Breast cancer Neg Hx     Social History:  reports that she quit smoking about 50 years ago. Her smoking use included cigarettes. She has a 30.00 pack-year smoking history. She has never used smokeless tobacco. She reports that she does not drink alcohol or use drugs.   Review of Systems:   ROS    She is on multiple treatment for depression/ADHD   Followed by PCP for hypertension  Not checking at home recently   BP Readings from Last 3 Encounters:  11/04/18 (!) 160/90  09/10/18 126/64  09/02/18 (!) 164/84    Examination:   There were no vitals taken for this visit.     Assessment/Plan:    She has ablative hypothyroidism, baseline TSH 16   Currently taking Cytomel 5 mcg, 1 tablet alternating with 2 along with 112 levothyroxine She is complaining of increasing fatigue but is also having more chronic pain and insomnia No other typical symptoms of hypothyroidism She is regular with taking her supplements TSH  is quite normal and free T3 and free T4 are normal also  Unlikely that she has symptoms from hypothyroidism since on the days she is taking 10 mcg of liothyronine she does not feel any better   HYPERCALCEMIA: She may possibly have mild hyperparathyroidism but also could be related to hydrochlorothiazide  She should also have vitamin D level checked by her PCP, currently taking 5000 units of vitamin D3  Osteopenia:  She should have follow-up bone density next year   There are no Patient Instructions on file for this visit.    Elayne Snare 05/05/2019

## 2019-05-27 DIAGNOSIS — Z23 Encounter for immunization: Secondary | ICD-10-CM | POA: Diagnosis not present

## 2019-05-27 DIAGNOSIS — F331 Major depressive disorder, recurrent, moderate: Secondary | ICD-10-CM | POA: Diagnosis not present

## 2019-05-27 DIAGNOSIS — M1990 Unspecified osteoarthritis, unspecified site: Secondary | ICD-10-CM | POA: Diagnosis not present

## 2019-05-27 DIAGNOSIS — F902 Attention-deficit hyperactivity disorder, combined type: Secondary | ICD-10-CM | POA: Diagnosis not present

## 2019-05-27 DIAGNOSIS — M7918 Myalgia, other site: Secondary | ICD-10-CM | POA: Diagnosis not present

## 2019-05-27 DIAGNOSIS — F411 Generalized anxiety disorder: Secondary | ICD-10-CM | POA: Diagnosis not present

## 2019-05-27 DIAGNOSIS — E039 Hypothyroidism, unspecified: Secondary | ICD-10-CM | POA: Diagnosis not present

## 2019-05-27 DIAGNOSIS — I10 Essential (primary) hypertension: Secondary | ICD-10-CM | POA: Diagnosis not present

## 2019-05-27 DIAGNOSIS — K219 Gastro-esophageal reflux disease without esophagitis: Secondary | ICD-10-CM | POA: Diagnosis not present

## 2019-05-27 DIAGNOSIS — R944 Abnormal results of kidney function studies: Secondary | ICD-10-CM | POA: Diagnosis not present

## 2019-05-27 DIAGNOSIS — Z0001 Encounter for general adult medical examination with abnormal findings: Secondary | ICD-10-CM | POA: Diagnosis not present

## 2019-08-22 ENCOUNTER — Telehealth: Payer: Self-pay

## 2019-08-22 NOTE — Telephone Encounter (Signed)
MEDICATION: liothyronine (CYTOMEL) 5 MCG tablet  PHARMACY:  [id number] - [place], [place] - [address] AT [address]  IS THIS A 90 DAY SUPPLY : yes   IS PATIENT OUT OF MEDICATION:   IF NOT; HOW MUCH IS LEFT:   LAST APPOINTMENT DATE: @8 /20/2020  NEXT APPOINTMENT DATE:@12 /14/2020  DO WE HAVE YOUR PERMISSION TO LEAVE A DETAILED MESSAGE:  OTHER COMMENTS: PATIENT NEEDS A NEW RX SENT IN    **Let patient know to contact pharmacy at the end of the day to make sure medication is ready. **  ** Please notify patient to allow 48-72 hours to process**  **Encourage patient to contact the pharmacy for refills or they can request refills through [facility] [address]**

## 2019-08-23 ENCOUNTER — Other Ambulatory Visit: Payer: Self-pay | Admitting: Endocrinology

## 2019-08-23 ENCOUNTER — Other Ambulatory Visit: Payer: Self-pay

## 2019-08-23 DIAGNOSIS — E890 Postprocedural hypothyroidism: Secondary | ICD-10-CM | POA: Diagnosis not present

## 2019-08-23 MED ORDER — LIOTHYRONINE SODIUM 5 MCG PO TABS: 7.5000 ug | ORAL_TABLET | Freq: Every day | ORAL | 0 refills | Status: DC

## 2019-08-23 NOTE — Telephone Encounter (Signed)
Rx sent 

## 2019-08-24 LAB — BASIC METABOLIC PANEL
BUN/Creatinine Ratio: 30 (calc) — ABNORMAL HIGH (ref 6–22)
BUN: 29 mg/dL — ABNORMAL HIGH (ref 7–25)
CO2: 30 mmol/L (ref 20–32)
Calcium: 10.6 mg/dL — ABNORMAL HIGH (ref 8.6–10.4)
Chloride: 98 mmol/L (ref 98–110)
Creat: 0.96 mg/dL — ABNORMAL HIGH (ref 0.60–0.93)
Glucose, Bld: 98 mg/dL (ref 65–139)
Potassium: 4 mmol/L (ref 3.5–5.3)
Sodium: 137 mmol/L (ref 135–146)

## 2019-08-24 LAB — TSH: TSH: 3.47 mIU/L (ref 0.40–4.50)

## 2019-08-24 LAB — T3, FREE: T3, Free: 2.5 pg/mL (ref 2.3–4.2)

## 2019-08-24 LAB — T4, FREE: Free T4: 1.2 ng/dL (ref 0.8–1.8)

## 2019-08-29 ENCOUNTER — Other Ambulatory Visit: Payer: Medicare Other

## 2019-08-31 ENCOUNTER — Ambulatory Visit (INDEPENDENT_AMBULATORY_CARE_PROVIDER_SITE_OTHER): Payer: Medicare Other | Admitting: Endocrinology

## 2019-08-31 ENCOUNTER — Encounter: Payer: Self-pay | Admitting: Endocrinology

## 2019-08-31 ENCOUNTER — Other Ambulatory Visit: Payer: Self-pay

## 2019-08-31 ENCOUNTER — Ambulatory Visit: Payer: Medicare Other | Admitting: Endocrinology

## 2019-08-31 DIAGNOSIS — E890 Postprocedural hypothyroidism: Secondary | ICD-10-CM | POA: Diagnosis not present

## 2019-08-31 MED ORDER — LIOTHYRONINE SODIUM 5 MCG PO TABS: 10.0000 ug | ORAL_TABLET | Freq: Every day | ORAL | 0 refills | Status: DC

## 2019-08-31 NOTE — Progress Notes (Signed)
Patient ID: [name], female   DOB: 1941/01/15, 78 y.o.   MRN: [id number]    Reason for Appointment:  Follow-up of thyroid and possible hypercalcemia    Today's office visit was provided via telemedicine using a telephone call to the patient Patient has been explained the limitations of evaluation and management by telemedicine and the availability of in person appointments.  The patient understood the limitations and agreed to proceed. Patient also understood that the telehealth visit is billable. . Location of the patient: Home . Location of the provider: Office Only the patient and myself were participating in the encounter     History of Present Illness:   THYROID  Previous history: She had a low TSH level as far back as 7 years ago She had symptoms of feeling hot and sweaty with physical activity, some fatigue and palpitations She has had long history of anxiety and depression   Because of her symptoms and TSH in the hyperthyroid range as well as uptake of 34% she was given I-131 treatment with 30 mCi on 09/27/14. Subsequently she was subjectively better When TSH  was  as high as 15.9 she was started on 75 g levothyroxine in 12/2014  Recent history:  She had progressive increase in her levothyroxine dose since 2017 The dose was increased in 11/17 and also in 2/18, was eventually taking 137 g On her visit in 7/18 she was complaining of feeling consistently tired although has an occasional day where she feels better She also thinks she was losing hair, no cold intolerance   Since she had a relatively low  T3 level  she was switched from levothyroxine 137 to Cytomel 5 g along with levothyroxine 112 g in July 2018  With her new combination regimen she initially felt less tired overall and had less hair loss  When her TSH was slightly higher and free T3 low normal in 10/2017 she was told to increase her Cytomel She is now taking 1 tablet alternating with 2  of the liothyronine  However again she is complaining of some fatigue and difficulty with clear thinking She thinks she is feeling the same on the days each she takes 2 tablets of liothyronine compared to when she takes 1 tablet  She has continued problems with significant insomnia and shoulder pain She has been quite regular with taking her thyroid supplements in the morning  Not clear if her weight has changed much  Her TSH is relatively higher at 3.5 compared to 1.7 Also free T3 is low normal at 2.5, not clear whether she took 1 tablet on that day of liothyronine   Wt Readings from Last 3 Encounters:  11/04/18 171 lb 9.6 oz (77.8 kg)  09/09/18 172 lb 4.8 oz (78.2 kg)  09/02/18 172 lb 4.8 oz (78.2 kg)    Lab Results  Component Value Date   TSH 3.47 08/23/2019   TSH 1.68 05/03/2019   TSH 4.43 11/04/2018   FREET4 1.2 08/23/2019   FREET4 1.3 05/03/2019   FREET4 0.98 11/04/2018    Lab Results  Component Value Date   T3FREE 2.5 08/23/2019   T3FREE 2.8 05/03/2019   T3FREE 3.0 11/04/2018   T3FREE 2.9 06/21/2018   HYPERCALCEMIA: Likely had started having hypercalcemia after 2017  However PTH level was not high She still takes HCTZ for hypertension  She does not take any calcium supplements and is taking 5000 units of vitamin D3  No history of osteoporosis, bone density done  by PCP showed only osteopenia done in 02/2018 with lowest T score -1.9  Her calcium appears to be gradually decreasing again  Lab Results  Component Value Date   CALCIUM 10.6 (H) 08/23/2019   CALCIUM 10.8 (H) 05/03/2019   CALCIUM 11.1 (H) 11/04/2018   CALCIUM 10.3 09/02/2018   CALCIUM 9.7 05/23/2016   CALCIUM 9.9 03/09/2015   CALCIUM 10.3 12/21/2014   Lab Results  Component Value Date   PTH 22 11/04/2018   CALCIUM 10.6 (H) 08/23/2019      Allergies as of 08/31/2019   No Active Allergies     Medication List       Accurate as of August 31, 2019  2:56 PM. If you have any questions,  ask your nurse or doctor.        amphetamine-dextroamphetamine 30 MG tablet Commonly known as: Adderall Take 1 tablet by mouth 2 (two) times daily. Fill in 2 months What changed: additional instructions   aspirin-acetaminophen-caffeine 250-250-65 MG tablet Commonly known as: EXCEDRIN MIGRAINE Take 1 tablet by mouth every 6 (six) hours as needed. What changed:   when to take this  reasons to take this   B Complex Plus Tabs Take 1 tablet by mouth daily.   buPROPion 300 MG 24 hr tablet Commonly known as: WELLBUTRIN XL TAKE 1 TABLET BY MOUTH ONCE DAILY   calcium carbonate 500 MG chewable tablet Commonly known as: TUMS - dosed in mg elemental calcium Chew 1 tablet by mouth daily.   calcium-vitamin D 500-200 MG-UNIT tablet Commonly known as: OSCAL WITH D Take 1 tablet by mouth daily with breakfast.   diclofenac sodium 1 % Gel Commonly known as: Voltaren APPLY   TOPICALLY TO AFFECTED AREA 4 TIMES DAILY AS NEEDED FOR PAIN What changed:   how much to take  how to take this  when to take this  reasons to take this  additional instructions   FLUoxetine 20 MG capsule Commonly known as: PROZAC TAKE ONE CAPSULE BY MOUTH ONCE DAILY   hydrochlorothiazide 12.5 MG capsule Commonly known as: MICROZIDE Take 12.5 mg by mouth daily. TAKE 1 TABLET BY MOUTH ONCE DAILY.   levothyroxine 112 MCG tablet Commonly known as: SYNTHROID TAKE 1 TABLET BY MOUTH ONCE DAILY BEFORE BREAKFAST What changed:   how much to take  how to take this  when to take this  additional instructions   liothyronine 5 MCG tablet Commonly known as: CYTOMEL Take 1.5 tablets (7.5 mcg total) by mouth daily.   losartan 100 MG tablet Commonly known as: COZAAR Take 100 mg by mouth daily. TAKE 1 TABLET BY MOUTH ONCE DAILY.   multivitamin capsule Take 1 capsule by mouth daily. Centrum Silver   OVER THE COUNTER MEDICATION Take 1 tablet by mouth daily. Joint Support tablet  includes:  Primal Celadrine turmeric   Systane 0.4-0.3 % Soln Generic drug: Polyethyl Glycol-Propyl Glycol Place 2 drops into both eyes 2 (two) times daily.   Vitamin D3 125 MCG (5000 UT) Caps Take 5,000 Units by mouth daily.       Allergies:  No Active Allergies  Past Medical History:  Diagnosis Date  . ALLERGIC RHINITIS 11/30/2008  . ALLERGIC RHINITIS 11/30/2008  . Anxiety   . ASTHMA UNSPECIFIED WITH EXACERBATION 02/17/2008   pt. states she does not and has never has asthma  . CAROTID BRUIT, LEFT 04/19/2007  . Closed fracture of lateral malleolus 06/04/2009  . CLOSED FRACTURE OF METATARSAL BONE 06/04/2009  . Depression   . Esophageal reflux 11/08/2008  .  GANGLION CYST 04/19/2007  . HYPERTENSION 04/07/2007  . Hypothyroidism   . INSOMNIA 08/07/2010  . OSTEOARTHRITIS 04/07/2007  . OSTEOPENIA 03/10/2008  . PONV (postoperative nausea and vomiting)   . UTI 07/20/2009    Past Surgical History:  Procedure Laterality Date  . EYE SURGERY Bilateral    cataract removal  . FUNCTIONAL ENDOSCOPIC SINUS SURGERY  01/2018   spinal fluid leaking behind right eye  . JOINT REPLACEMENT     knuckle  . REVERSE SHOULDER ARTHROPLASTY Left 09/09/2018  . REVERSE SHOULDER ARTHROPLASTY Left 09/09/2018   Procedure: REVERSE SHOULDER ARTHROPLASTY;  Surgeon: Justice Britain, MD;  Location: [place];  Service: Orthopedics;  Laterality: Left;  174min  . TOTAL HIP ARTHROPLASTY    . TUBAL LIGATION      Family History  Problem Relation Age of Onset  . Heart disease Mother        Mother also has significant carotid artery stenosis  . COPD Father   . Alcoholism Father   . Cancer Brother   . Alzheimer's disease Sister   . Thyroid disease Sister        Goiter  . Breast cancer Neg Hx     Social History:  reports that she quit smoking about 50 years ago. Her smoking use included cigarettes. She has a 30.00 pack-year smoking history. She has never used smokeless tobacco. She reports that she does not drink  alcohol or use drugs.   Review of Systems:   ROS     She is on multiple treatment for depression/ADHD and also complains of insomnia She also has ADHD on treatment  Followed by PCP for hypertension  Blood pressure readings:   BP Readings from Last 3 Encounters:  11/04/18 (!) 160/90  09/10/18 126/64  09/02/18 (!) 164/84    Examination:   There were no vitals taken for this visit.  Patient not in the office, no exam done  Assessment/Plan:    She has been on thyroid supplementation for post ablative hypothyroidism, baseline TSH 16  She has been on a regimen of 112 g of levothyroxine and 5 g of Cytomel alternating with 10 mcg  Although she had felt better with the combination of T3 and T4 initially she is having persistent symptoms of either fatigue or decreased concentration She has been quite regular with her supplement reportedly  However as before she still has issues with insomnia, chronic pain and some depression  Although her TSH is normal it is on the higher side and her T3 level is on the lower side of normal now  We will increase her liothyronine to 10 mcg every day instead of alternating 10 and 5, will not increase her dosage significantly because of TSH only being 3.5  HYPERCALCEMIA: This is improving and may be related to HCTZ since her PTH was only 22  She will need to follow-up in 3 months again  There are no Patient Instructions on file for this visit.  Duration of telephone encounter =8 minutes   Elayne Snare 08/31/2019       Elayne Snare 08/31/2019

## 2019-10-18 DIAGNOSIS — F909 Attention-deficit hyperactivity disorder, unspecified type: Secondary | ICD-10-CM | POA: Diagnosis not present

## 2019-10-18 DIAGNOSIS — F419 Anxiety disorder, unspecified: Secondary | ICD-10-CM | POA: Diagnosis not present

## 2019-10-18 DIAGNOSIS — F331 Major depressive disorder, recurrent, moderate: Secondary | ICD-10-CM | POA: Diagnosis not present

## 2019-10-20 ENCOUNTER — Ambulatory Visit (HOSPITAL_COMMUNITY): Payer: Medicare Other | Admitting: Psychiatry

## 2019-10-30 ENCOUNTER — Other Ambulatory Visit: Payer: Self-pay | Admitting: Endocrinology

## 2019-10-31 ENCOUNTER — Other Ambulatory Visit: Payer: Self-pay

## 2019-10-31 MED ORDER — LIOTHYRONINE SODIUM 5 MCG PO TABS: 10.0000 ug | ORAL_TABLET | Freq: Every day | ORAL | 2 refills | Status: DC

## 2019-11-05 NOTE — Progress Notes (Deleted)
Psychiatric Initial Adult Assessment   Patient Identification: [name] MRN:  [id number] Date of Evaluation:  11/05/2019 Referral Source: DR. Delphina Cahill Chief Complaint:   Visit Diagnosis: No diagnosis found.  History of Present Illness:   [name] is a 79 y.o. year old female with a history of depression, anxiety, ADHD per chart, hypothyroidism, who is referred for depression/anxiety.      Associated Signs/Symptoms: Depression Symptoms:  {DEPRESSION SYMPTOMS:20000} (Hypo) Manic Symptoms:  {BHH MANIC SYMPTOMS:22872} Anxiety Symptoms:  {BHH ANXIETY SYMPTOMS:22873} Psychotic Symptoms:  {BHH PSYCHOTIC SYMPTOMS:22874} PTSD Symptoms: {BHH PTSD SYMPTOMS:22875}  Past Psychiatric History:  Outpatient:  Psychiatry admission:  Previous suicide attempt:  Past trials of medication:  History of violence:   Previous Psychotropic Medications: {YES/NO:21197}  Substance Abuse History in the last 12 months:  {yes no:314532}  Consequences of Substance Abuse: {BHH CONSEQUENCES OF SUBSTANCE ABUSE:22880}  Past Medical History:  Past Medical History:  Diagnosis Date  . ALLERGIC RHINITIS 11/30/2008  . ALLERGIC RHINITIS 11/30/2008  . Anxiety   . ASTHMA UNSPECIFIED WITH EXACERBATION 02/17/2008   pt. states she does not and has never has asthma  . CAROTID BRUIT, LEFT 04/19/2007  . Closed fracture of lateral malleolus 06/04/2009  . CLOSED FRACTURE OF METATARSAL BONE 06/04/2009  . Depression   . Esophageal reflux 11/08/2008  . GANGLION CYST 04/19/2007  . HYPERTENSION 04/07/2007  . Hypothyroidism   . INSOMNIA 08/07/2010  . OSTEOARTHRITIS 04/07/2007  . OSTEOPENIA 03/10/2008  . PONV (postoperative nausea and vomiting)   . UTI 07/20/2009    Past Surgical History:  Procedure Laterality Date  . EYE SURGERY Bilateral    cataract removal  . FUNCTIONAL ENDOSCOPIC SINUS SURGERY  01/2018   spinal fluid leaking behind right eye  . JOINT REPLACEMENT     knuckle  . REVERSE SHOULDER  ARTHROPLASTY Left 09/09/2018  . REVERSE SHOULDER ARTHROPLASTY Left 09/09/2018   Procedure: REVERSE SHOULDER ARTHROPLASTY;  Surgeon: Justice Britain, MD;  Location: [place];  Service: Orthopedics;  Laterality: Left;  180min  . TOTAL HIP ARTHROPLASTY    . TUBAL LIGATION      Family Psychiatric History: ***  Family History:  Family History  Problem Relation Age of Onset  . Heart disease Mother        Mother also has significant carotid artery stenosis  . COPD Father   . Alcoholism Father   . Cancer Brother   . Alzheimer's disease Sister   . Thyroid disease Sister        Goiter  . Breast cancer Neg Hx     Social History:   Social History   Socioeconomic History  . Marital status: Married    Spouse name: Not on file  . Number of children: Not on file  . Years of education: Not on file  . Highest education level: Not on file  Occupational History  . Occupation: retired    Fish farm manager: RETIRED  Tobacco Use  . Smoking status: Former Smoker    Packs/day: 1.50    Years: 20.00    Pack years: 30.00    Types: Cigarettes    Quit date: 09/15/1968    Years since quitting: 51.1  . Smokeless tobacco: Never Used  . Tobacco comment: smoked 1.5 for  20 years  Substance and Sexual Activity  . Alcohol use: No  . Drug use: No  . Sexual activity: Yes  Other Topics Concern  . Not on file  Social History Narrative   Married for 50 years. She has  1 son that age 91   Retired from Environmental [employer]         Social Determinants of Health   Financial Resource Strain:   . Difficulty of Paying Living Expenses: Not on file  Food Insecurity:   . Worried About Charity fundraiser in the Last Year: Not on file  . Ran Out of Food in the Last Year: Not on file  Transportation Needs:   . Lack of Transportation (Medical): Not on file  . Lack of Transportation (Non-Medical): Not on file  Physical Activity:   . Days of Exercise per Week: Not on file  . Minutes of Exercise per Session: Not  on file  Stress:   . Feeling of Stress : Not on file  Social Connections:   . Frequency of Communication with Friends and Family: Not on file  . Frequency of Social Gatherings with Friends and Family: Not on file  . Attends Religious Services: Not on file  . Active Member of Clubs or Organizations: Not on file  . Attends Archivist Meetings: Not on file  . Marital Status: Not on file    Additional Social History: ***  Allergies:  No Active Allergies  Metabolic Disorder Labs: No results found for: HGBA1C, MPG No results found for: PROLACTIN Lab Results  Component Value Date   CHOL 197 05/23/2016   TRIG 62.0 05/23/2016   HDL 76.10 05/23/2016   CHOLHDL 3 05/23/2016   VLDL 12.4 05/23/2016   LDLCALC 108 (H) 05/23/2016   LDLCALC 122 (H) 03/09/2015   Lab Results  Component Value Date   TSH 3.47 08/23/2019    Therapeutic Level Labs: No results found for: LITHIUM No results found for: CBMZ No results found for: VALPROATE  Current Medications: Current Outpatient Medications  Medication Sig Dispense Refill  . amphetamine-dextroamphetamine (ADDERALL) 30 MG tablet Take 1 tablet by mouth 2 (two) times daily. Fill in 2 months (Patient taking differently: Take 30 mg by mouth 2 (two) times daily. ) 60 tablet 0  . aspirin-acetaminophen-caffeine (EXCEDRIN MIGRAINE) 250-250-65 MG per tablet Take 1 tablet by mouth every 6 (six) hours as needed. (Patient taking differently: Take 1 tablet by mouth daily as needed for headache or migraine. ) 30 tablet 0  . B Complex-Folic Acid (B COMPLEX PLUS) TABS Take 1 tablet by mouth daily.    Marland Kitchen buPROPion (WELLBUTRIN XL) 300 MG 24 hr tablet TAKE 1 TABLET BY MOUTH ONCE DAILY (Patient taking differently: Take 300 mg by mouth daily. ) 90 tablet 1  . calcium carbonate (TUMS - DOSED IN MG ELEMENTAL CALCIUM) 500 MG chewable tablet Chew 1 tablet by mouth daily.    . calcium-vitamin D (OSCAL WITH D) 500-200 MG-UNIT tablet Take 1 tablet by mouth daily  with breakfast.    . Cholecalciferol (VITAMIN D3) 125 MCG (5000 UT) CAPS Take 5,000 Units by mouth daily.    . diclofenac sodium (VOLTAREN) 1 % GEL APPLY   TOPICALLY TO AFFECTED AREA 4 TIMES DAILY AS NEEDED FOR PAIN (Patient taking differently: Apply 1 g topically 4 (four) times daily as needed. FOR PAIN) 100 g 11  . FLUoxetine (PROZAC) 20 MG capsule TAKE ONE CAPSULE BY MOUTH ONCE DAILY (Patient taking differently: Take 20 mg by mouth daily. ) 90 capsule 1  . hydrochlorothiazide (MICROZIDE) 12.5 MG capsule Take 12.5 mg by mouth daily. TAKE 1 TABLET BY MOUTH ONCE DAILY.    Marland Kitchen levothyroxine (SYNTHROID, LEVOTHROID) 112 MCG tablet TAKE 1 TABLET BY MOUTH ONCE DAILY BEFORE  BREAKFAST (Patient taking differently: Take 112 mcg by mouth daily before breakfast. ) 90 tablet 1  . liothyronine (CYTOMEL) 5 MCG tablet Take 2 tablets (10 mcg total) by mouth daily. Take 2 tablets (75mcg total) by mouth daily. 60 tablet 2  . losartan (COZAAR) 100 MG tablet Take 100 mg by mouth daily. TAKE 1 TABLET BY MOUTH ONCE DAILY.    . Multiple Vitamin (MULTIVITAMIN) capsule Take 1 capsule by mouth daily. Centrum Silver    . OVER THE COUNTER MEDICATION Take 1 tablet by mouth daily. Joint Support tablet includes:  Primal Celadrine turmeric    . Polyethyl Glycol-Propyl Glycol (SYSTANE) 0.4-0.3 % SOLN Place 2 drops into both eyes 2 (two) times daily.     No current facility-administered medications for this visit.    Musculoskeletal: Strength & Muscle Tone: N/A Gait & Station: N/A Patient leans: N/A  Psychiatric Specialty Exam: Review of Systems  There were no vitals taken for this visit.There is no height or weight on file to calculate BMI.  General Appearance: {Appearance:22683}  Eye Contact:  {BHH EYE CONTACT:22684}  Speech:  Clear and Coherent  Volume:  Normal  Mood:  {BHH MOOD:22306}  Affect:  {Affect (PAA):22687}  Thought Process:  Coherent  Orientation:  Full (Time, Place, and Person)  Thought Content:   Logical  Suicidal Thoughts:  {ST/HT (PAA):22692}  Homicidal Thoughts:  {ST/HT (PAA):22692}  Memory:  Immediate;   Good  Judgement:  {Judgement (PAA):22694}  Insight:  {Insight (PAA):22695}  Psychomotor Activity:  Normal  Concentration:  Concentration: Good and Attention Span: Good  Recall:  Good  Fund of Knowledge:Good  Language: Good  Akathisia:  No  Handed:  Right  AIMS (if indicated):  not done  Assets:  Communication Skills Desire for Improvement  ADL's:  Intact  Cognition: WNL  Sleep:  {BHH GOOD/FAIR/POOR:22877}   Screenings: PHQ2-9     Office Visit from 05/23/2016 in [place] at [facility] from 04/02/2016 in [place] at [facility] from 03/09/2015 in [place] at [facility] from 11/17/2012 in [place] at [facility] Total Score  1  0  6  2  PHQ-9 Total Score  --  --  21  11      Assessment and Plan:  [name] is a 79 y.o. year old female with a history of depression, anxiety, ADHD per chart, hypothyroidism, who is referred for depression/anxiety.     Plan  The patient demonstrates the following risk factors for suicide: Chronic risk factors for suicide include: {Chronic Risk Factors for AS:6451928. Acute risk factors for suicide include: {Acute Risk Factors for SW:8008971. Protective factors for this patient include: {Protective Factors for Suicide CJ:814540. Considering these factors, the overall suicide risk at this point appears to be {Desc; low/moderate/high:110033}. Patient {ACTION; IS/IS VG:4697475 appropriate for outpatient follow up.   Norman Clay, MD 2/20/20218:36 AM

## 2019-11-09 ENCOUNTER — Ambulatory Visit (HOSPITAL_COMMUNITY): Payer: Medicare Other | Admitting: Psychiatry

## 2019-11-21 DIAGNOSIS — R944 Abnormal results of kidney function studies: Secondary | ICD-10-CM | POA: Diagnosis not present

## 2019-11-21 DIAGNOSIS — N390 Urinary tract infection, site not specified: Secondary | ICD-10-CM | POA: Diagnosis not present

## 2019-11-21 DIAGNOSIS — G9600 Cerebrospinal fluid leak, unspecified: Secondary | ICD-10-CM | POA: Diagnosis not present

## 2019-11-21 DIAGNOSIS — M8580 Other specified disorders of bone density and structure, unspecified site: Secondary | ICD-10-CM | POA: Diagnosis not present

## 2019-11-21 DIAGNOSIS — E039 Hypothyroidism, unspecified: Secondary | ICD-10-CM | POA: Diagnosis not present

## 2019-11-21 DIAGNOSIS — I10 Essential (primary) hypertension: Secondary | ICD-10-CM | POA: Diagnosis not present

## 2019-11-21 DIAGNOSIS — J302 Other seasonal allergic rhinitis: Secondary | ICD-10-CM | POA: Diagnosis not present

## 2019-11-21 DIAGNOSIS — J029 Acute pharyngitis, unspecified: Secondary | ICD-10-CM | POA: Diagnosis not present

## 2019-11-21 DIAGNOSIS — R519 Headache, unspecified: Secondary | ICD-10-CM | POA: Diagnosis not present

## 2019-11-21 DIAGNOSIS — Z6827 Body mass index (BMI) 27.0-27.9, adult: Secondary | ICD-10-CM | POA: Diagnosis not present

## 2019-11-21 DIAGNOSIS — F331 Major depressive disorder, recurrent, moderate: Secondary | ICD-10-CM | POA: Diagnosis not present

## 2019-11-21 DIAGNOSIS — M545 Low back pain: Secondary | ICD-10-CM | POA: Diagnosis not present

## 2019-11-21 LAB — TSH: TSH: 5.54 (ref 0.41–5.90)

## 2019-11-21 LAB — HEMOGLOBIN A1C: Hemoglobin A1C: 5.8

## 2019-11-21 LAB — LIPID PANEL
Cholesterol: 211 — AB (ref 0–200)
HDL: 98 — AB (ref 35–70)
LDL Cholesterol: 101
Triglycerides: 66 (ref 40–160)

## 2019-11-21 LAB — BASIC METABOLIC PANEL: Creatinine: 1 (ref <=1.1)

## 2019-11-29 ENCOUNTER — Other Ambulatory Visit: Payer: Self-pay

## 2019-11-29 ENCOUNTER — Ambulatory Visit: Payer: Medicare Other | Admitting: Endocrinology

## 2019-11-30 DIAGNOSIS — Z23 Encounter for immunization: Secondary | ICD-10-CM | POA: Diagnosis not present

## 2019-12-20 DIAGNOSIS — F331 Major depressive disorder, recurrent, moderate: Secondary | ICD-10-CM | POA: Diagnosis not present

## 2019-12-20 DIAGNOSIS — M7918 Myalgia, other site: Secondary | ICD-10-CM | POA: Diagnosis not present

## 2019-12-20 DIAGNOSIS — E039 Hypothyroidism, unspecified: Secondary | ICD-10-CM | POA: Diagnosis not present

## 2019-12-20 DIAGNOSIS — K219 Gastro-esophageal reflux disease without esophagitis: Secondary | ICD-10-CM | POA: Diagnosis not present

## 2019-12-20 DIAGNOSIS — M1990 Unspecified osteoarthritis, unspecified site: Secondary | ICD-10-CM | POA: Diagnosis not present

## 2019-12-20 DIAGNOSIS — F902 Attention-deficit hyperactivity disorder, combined type: Secondary | ICD-10-CM | POA: Diagnosis not present

## 2019-12-20 DIAGNOSIS — F411 Generalized anxiety disorder: Secondary | ICD-10-CM | POA: Diagnosis not present

## 2019-12-20 DIAGNOSIS — R944 Abnormal results of kidney function studies: Secondary | ICD-10-CM | POA: Diagnosis not present

## 2019-12-20 DIAGNOSIS — I10 Essential (primary) hypertension: Secondary | ICD-10-CM | POA: Diagnosis not present

## 2019-12-28 DIAGNOSIS — Z23 Encounter for immunization: Secondary | ICD-10-CM | POA: Diagnosis not present

## 2020-02-01 ENCOUNTER — Other Ambulatory Visit: Payer: Self-pay

## 2020-02-01 MED ORDER — LIOTHYRONINE SODIUM 5 MCG PO TABS: ORAL_TABLET | ORAL | 2 refills | Status: DC

## 2020-02-09 ENCOUNTER — Telehealth: Payer: Self-pay | Admitting: Endocrinology

## 2020-02-09 ENCOUNTER — Telehealth: Payer: Self-pay

## 2020-02-09 NOTE — Telephone Encounter (Signed)
Labs printed out and will be faxed today.

## 2020-02-09 NOTE — Telephone Encounter (Signed)
Error, early entry

## 2020-02-09 NOTE — Telephone Encounter (Signed)
Patient is scheduled on June 14th and is getting labs done at [facility] - she gave the fax # for them its [phone] and asked if we could send the orders there.

## 2020-02-10 NOTE — Telephone Encounter (Signed)
FAXED [place]: [facility]  Document: BMP, Free T3, Free T4, and TSH lab orders Other records requested: none at this time.  All above requested information has been faxed successfully to [facility] listed above. Documents and fax confirmation have been placed in the faxed file for future reference.

## 2020-02-27 ENCOUNTER — Ambulatory Visit: Payer: Medicare Other | Admitting: Endocrinology

## 2020-02-29 ENCOUNTER — Other Ambulatory Visit (INDEPENDENT_AMBULATORY_CARE_PROVIDER_SITE_OTHER): Payer: Medicare Other

## 2020-02-29 ENCOUNTER — Other Ambulatory Visit: Payer: Self-pay

## 2020-02-29 DIAGNOSIS — E890 Postprocedural hypothyroidism: Secondary | ICD-10-CM | POA: Diagnosis not present

## 2020-02-29 LAB — BASIC METABOLIC PANEL
BUN: 31 mg/dL — ABNORMAL HIGH (ref 6–23)
CO2: 33 mEq/L — ABNORMAL HIGH (ref 19–32)
Calcium: 10.7 mg/dL — ABNORMAL HIGH (ref 8.4–10.5)
Chloride: 98 mEq/L (ref 96–112)
Creatinine, Ser: 1.03 mg/dL (ref 0.40–1.20)
GFR: 51.54 mL/min — ABNORMAL LOW (ref >60.00)
Glucose, Bld: 100 mg/dL — ABNORMAL HIGH (ref 70–99)
Potassium: 3.6 mEq/L (ref 3.5–5.1)
Sodium: 137 mEq/L (ref 135–145)

## 2020-02-29 LAB — TSH: TSH: 2.94 u[IU]/mL (ref 0.35–4.50)

## 2020-02-29 LAB — T4, FREE: Free T4: 0.91 ng/dL (ref 0.60–1.60)

## 2020-02-29 LAB — T3, FREE: T3, Free: 3.3 pg/mL (ref 2.3–4.2)

## 2020-02-29 NOTE — Addendum Note (Signed)
Addended by: Kaylyn Lim I on: 02/29/2020 11:49 AM   Modules accepted: Orders

## 2020-03-08 ENCOUNTER — Ambulatory Visit (INDEPENDENT_AMBULATORY_CARE_PROVIDER_SITE_OTHER): Payer: Medicare Other | Admitting: Endocrinology

## 2020-03-08 ENCOUNTER — Other Ambulatory Visit: Payer: Self-pay

## 2020-03-08 ENCOUNTER — Encounter: Payer: Self-pay | Admitting: Endocrinology

## 2020-03-08 VITALS — BP 126/84 | HR 96 | Ht 64.0 in | Wt 173.0 lb

## 2020-03-08 DIAGNOSIS — E890 Postprocedural hypothyroidism: Secondary | ICD-10-CM | POA: Diagnosis not present

## 2020-03-08 NOTE — Progress Notes (Signed)
Patient ID: [name], female   DOB: Jun 12, 1941, 79 y.o.   MRN: [id number]    Reason for Appointment:  Follow-up of thyroid and hypercalcemia       History of Present Illness:   THYROID  Previous history: She had a low TSH level as far back as 7 years ago She had symptoms of feeling hot and sweaty with physical activity, some fatigue and palpitations She has had long history of anxiety and depression   Because of her symptoms and TSH in the hyperthyroid range as well as uptake of 34% she was given I-131 treatment with 30 mCi on 09/27/14. Subsequently she was subjectively better When TSH  was  as high as 15.9 she was started on 75 g levothyroxine in 12/2014  Recent history:  She had progressive increase in her levothyroxine dose since 2017 The dose was increased in 11/17 and also in 2/18, was eventually taking 137 g On her visit in 7/18 she was complaining of feeling consistently tired although has an occasional day where she feels better She also thinks she was losing hair, no cold intolerance   Since she had a relatively low  T3 level  she was switched from levothyroxine 137 to Cytomel 5 g along with levothyroxine 112 g in July 2018  With her new combination regimen she initially felt less tired overall and had less hair loss  When her TSH was slightly higher and free T3 low normal in 12/20 she was told to increase her Cytomel She is now taking 2 tablets of the liothyronine 5 mcg dose She did not come back for follow-up after her labs were done in March  As before she is complaining of continued fatigue and difficulty with focusing mentally  She has continued problems with significant insomnia and her emotional state is not as good, she is also more irritable Weight is unchanged She has been quite regular with taking her thyroid supplements in the morning  Her TSH is 2.9 and her free T3 and T4 are fairly good  Wt Readings from Last 3 Encounters:   03/08/20 173 lb (78.5 kg)  11/04/18 171 lb 9.6 oz (77.8 kg)  09/09/18 172 lb 4.8 oz (78.2 kg)    Lab Results  Component Value Date   TSH 2.94 02/29/2020   TSH 5.54 11/21/2019   TSH 3.47 08/23/2019   FREET4 0.91 02/29/2020   FREET4 1.2 08/23/2019   FREET4 1.3 05/03/2019    Lab Results  Component Value Date   T3FREE 3.3 02/29/2020   T3FREE 2.5 08/23/2019   T3FREE 2.8 05/03/2019   T3FREE 3.0 11/04/2018   HYPERCALCEMIA: Likely had started having hypercalcemia after 2017  However PTH level just over 20 on the last measurement She does take 12.5 mg HCTZ for hypertension  She is taking 5000 units of vitamin D3  No history of osteoporosis, bone density done by PCP showed only osteopenia done in 02/2018 with lowest T score -1.9  Her calcium is stable and slightly above normal range again  Lab Results  Component Value Date   CALCIUM 10.7 (H) 02/29/2020   CALCIUM 10.6 (H) 08/23/2019   CALCIUM 10.8 (H) 05/03/2019   CALCIUM 11.1 (H) 11/04/2018   CALCIUM 10.3 09/02/2018   CALCIUM 9.7 05/23/2016   CALCIUM 9.9 03/09/2015   Lab Results  Component Value Date   PTH 22 11/04/2018   CALCIUM 10.7 (H) 02/29/2020      Allergies as of 03/08/2020   No Active Allergies  Medication List       Accurate as of March 08, 2020  2:19 PM. If you have any questions, ask your nurse or doctor.        STOP taking these medications   calcium carbonate 500 MG chewable tablet Commonly known as: TUMS - dosed in mg elemental calcium Stopped by: Elayne Snare, MD     TAKE these medications   amphetamine-dextroamphetamine 30 MG tablet Commonly known as: Adderall Take 1 tablet by mouth 2 (two) times daily. Fill in 2 months What changed: additional instructions   aspirin-acetaminophen-caffeine 250-250-65 MG tablet Commonly known as: EXCEDRIN MIGRAINE Take 1 tablet by mouth every 6 (six) hours as needed. What changed:   when to take this  reasons to take this   B Complex Plus  Tabs Take 1 tablet by mouth daily.   buPROPion 300 MG 24 hr tablet Commonly known as: WELLBUTRIN XL TAKE 1 TABLET BY MOUTH ONCE DAILY   calcium-vitamin D 500-200 MG-UNIT tablet Commonly known as: OSCAL WITH D Take 1 tablet by mouth daily with breakfast.   diclofenac sodium 1 % Gel Commonly known as: Voltaren APPLY   TOPICALLY TO AFFECTED AREA 4 TIMES DAILY AS NEEDED FOR PAIN What changed:   how much to take  how to take this  when to take this  reasons to take this  additional instructions   FLUoxetine 20 MG capsule Commonly known as: PROZAC TAKE ONE CAPSULE BY MOUTH ONCE DAILY   hydrochlorothiazide 12.5 MG capsule Commonly known as: MICROZIDE Take 12.5 mg by mouth daily. TAKE 1 TABLET BY MOUTH ONCE DAILY.   levothyroxine 112 MCG tablet Commonly known as: SYNTHROID TAKE 1 TABLET BY MOUTH ONCE DAILY BEFORE BREAKFAST What changed:   how much to take  how to take this  when to take this  additional instructions   liothyronine 5 MCG tablet Commonly known as: CYTOMEL Take 1 tablet by mouth once daily, and then 2 tabs the next day alternating. What changed:   how much to take  additional instructions   losartan 100 MG tablet Commonly known as: COZAAR Take 100 mg by mouth daily. TAKE 1 TABLET BY MOUTH ONCE DAILY.   multivitamin capsule Take 1 capsule by mouth daily. Centrum Silver   OVER THE COUNTER MEDICATION Take 1 tablet by mouth daily. Joint Support tablet includes:  Primal Celadrine turmeric   Systane 0.4-0.3 % Soln Generic drug: Polyethyl Glycol-Propyl Glycol Place 2 drops into both eyes 2 (two) times daily.   Vitamin D3 125 MCG (5000 UT) Caps Take 5,000 Units by mouth daily.       Allergies:  No Active Allergies  Past Medical History:  Diagnosis Date  . ALLERGIC RHINITIS 11/30/2008  . ALLERGIC RHINITIS 11/30/2008  . Anxiety   . ASTHMA UNSPECIFIED WITH EXACERBATION 02/17/2008   pt. states she does not and has never has asthma  .  CAROTID BRUIT, LEFT 04/19/2007  . Closed fracture of lateral malleolus 06/04/2009  . CLOSED FRACTURE OF METATARSAL BONE 06/04/2009  . Depression   . Esophageal reflux 11/08/2008  . GANGLION CYST 04/19/2007  . HYPERTENSION 04/07/2007  . Hypothyroidism   . INSOMNIA 08/07/2010  . OSTEOARTHRITIS 04/07/2007  . OSTEOPENIA 03/10/2008  . PONV (postoperative nausea and vomiting)   . UTI 07/20/2009    Past Surgical History:  Procedure Laterality Date  . EYE SURGERY Bilateral    cataract removal  . FUNCTIONAL ENDOSCOPIC SINUS SURGERY  01/2018   spinal fluid leaking behind right eye  .  JOINT REPLACEMENT     knuckle  . REVERSE SHOULDER ARTHROPLASTY Left 09/09/2018  . REVERSE SHOULDER ARTHROPLASTY Left 09/09/2018   Procedure: REVERSE SHOULDER ARTHROPLASTY;  Surgeon: Justice Britain, MD;  Location: [facility];  Service: Orthopedics;  Laterality: Left;  131min  . TOTAL HIP ARTHROPLASTY    . TUBAL LIGATION      Family History  Problem Relation Age of Onset  . Heart disease Mother        Mother also has significant carotid artery stenosis  . COPD Father   . Alcoholism Father   . Cancer Brother   . Alzheimer's disease Sister   . Thyroid disease Sister        Goiter  . Breast cancer Neg Hx     Social History:  reports that she quit smoking about 51 years ago. Her smoking use included cigarettes. She has a 30.00 pack-year smoking history. She has never used smokeless tobacco. She reports that she does not drink alcohol and does not use drugs.   Review of Systems:   ROS     She is on multiple treatment for depression/ADHD and also complains of insomnia She also has ADHD on treatment  Followed by PCP for hypertension  Blood pressure readings:   BP Readings from Last 3 Encounters:  03/08/20 126/84  11/04/18 (!) 160/90  09/10/18 126/64    Examination:   BP 126/84 (BP Location: Left Arm, Patient Position: Sitting, Cuff Size: Normal)   Pulse 96   Ht 5\' 4"  (1.626 m)   Wt 173 lb (78.5 kg)    SpO2 96%   BMI 29.70 kg/m   Thyroid not palpable She has slightly brisk reflexes Mild tremor especially on the left  Assessment/Plan:    She has been on thyroid supplementation for post ablative hypothyroidism, baseline TSH 16  She has been on a regimen of 112 g of levothyroxine and 10 g of Cytomel  Although she had felt better with the combination of T3 and T4 initially she is having persistent symptoms of fatigue and decreased concentration She has been quite regular with her doses of both supplements Discussed that her symptoms are not explained by her thyroid levels which are normal and improved compared to the last 2 levels including T3 level  She will continue same dose  Depression: She appears to be still having symptoms including low mood and irritability along with insomnia She needs to talk to her PCP or work with a psychiatrist for this  HYPERCALCEMIA: This may be related to HCTZ since her PTH was only 22 Calcium is now stable below 11  She will need to follow-up in 6 months unless having new symptoms  There are no Patient Instructions on file for this visit.     Elayne Snare 03/08/2020       Elayne Snare 03/08/2020

## 2020-04-18 ENCOUNTER — Other Ambulatory Visit: Payer: Self-pay

## 2020-04-18 ENCOUNTER — Other Ambulatory Visit: Payer: Self-pay | Admitting: Endocrinology

## 2020-04-18 DIAGNOSIS — E890 Postprocedural hypothyroidism: Secondary | ICD-10-CM

## 2020-04-18 MED ORDER — LIOTHYRONINE SODIUM 5 MCG PO TABS: ORAL_TABLET | ORAL | 2 refills | Status: DC

## 2020-05-23 ENCOUNTER — Telehealth: Payer: Self-pay | Admitting: Endocrinology

## 2020-05-23 ENCOUNTER — Other Ambulatory Visit: Payer: Self-pay

## 2020-05-23 NOTE — Telephone Encounter (Signed)
Patient would like lab orders printed and mailed to her so she can have them and bring them to where ever she wants to get them drawn prior to her December appointment. I've verified patient's mailing address already.

## 2020-05-23 NOTE — Telephone Encounter (Signed)
Kieth Brightly, would you please release the orders and print the requisition. I will mail them tomorrow.

## 2020-06-20 DIAGNOSIS — M25811 Other specified joint disorders, right shoulder: Secondary | ICD-10-CM | POA: Diagnosis not present

## 2020-06-20 DIAGNOSIS — M25511 Pain in right shoulder: Secondary | ICD-10-CM | POA: Diagnosis not present

## 2020-06-20 DIAGNOSIS — Z471 Aftercare following joint replacement surgery: Secondary | ICD-10-CM | POA: Diagnosis not present

## 2020-06-20 DIAGNOSIS — Z96612 Presence of left artificial shoulder joint: Secondary | ICD-10-CM | POA: Diagnosis not present

## 2020-06-29 ENCOUNTER — Emergency Department (HOSPITAL_COMMUNITY): Payer: Medicare Other

## 2020-06-29 ENCOUNTER — Encounter (HOSPITAL_COMMUNITY): Payer: Self-pay | Admitting: *Deleted

## 2020-06-29 ENCOUNTER — Other Ambulatory Visit: Payer: Self-pay

## 2020-06-29 ENCOUNTER — Emergency Department (HOSPITAL_COMMUNITY)
Admission: EM | Admit: 2020-06-29 | Discharge: 2020-06-29 | Disposition: A | Discharge status: Home / Self Care | Payer: Medicare Other | Attending: Emergency Medicine | Admitting: Emergency Medicine

## 2020-06-29 DIAGNOSIS — Z96612 Presence of left artificial shoulder joint: Secondary | ICD-10-CM | POA: Insufficient documentation

## 2020-06-29 DIAGNOSIS — S0231XA Fracture of orbital floor, right side, initial encounter for closed fracture: Secondary | ICD-10-CM | POA: Diagnosis not present

## 2020-06-29 DIAGNOSIS — S0181XA Laceration without foreign body of other part of head, initial encounter: Secondary | ICD-10-CM | POA: Insufficient documentation

## 2020-06-29 DIAGNOSIS — J45901 Unspecified asthma with (acute) exacerbation: Secondary | ICD-10-CM | POA: Insufficient documentation

## 2020-06-29 DIAGNOSIS — W010XXA Fall on same level from slipping, tripping and stumbling without subsequent striking against object, initial encounter: Secondary | ICD-10-CM | POA: Diagnosis not present

## 2020-06-29 DIAGNOSIS — Z79890 Hormone replacement therapy (postmenopausal): Secondary | ICD-10-CM | POA: Diagnosis not present

## 2020-06-29 DIAGNOSIS — S01111A Laceration without foreign body of right eyelid and periocular area, initial encounter: Secondary | ICD-10-CM | POA: Insufficient documentation

## 2020-06-29 DIAGNOSIS — E039 Hypothyroidism, unspecified: Secondary | ICD-10-CM | POA: Diagnosis not present

## 2020-06-29 DIAGNOSIS — Z79899 Other long term (current) drug therapy: Secondary | ICD-10-CM | POA: Diagnosis not present

## 2020-06-29 DIAGNOSIS — Z23 Encounter for immunization: Secondary | ICD-10-CM | POA: Diagnosis not present

## 2020-06-29 DIAGNOSIS — I10 Essential (primary) hypertension: Secondary | ICD-10-CM | POA: Insufficient documentation

## 2020-06-29 DIAGNOSIS — Z7982 Long term (current) use of aspirin: Secondary | ICD-10-CM | POA: Insufficient documentation

## 2020-06-29 DIAGNOSIS — S0990XA Unspecified injury of head, initial encounter: Secondary | ICD-10-CM | POA: Diagnosis present

## 2020-06-29 MED ORDER — LIDOCAINE-EPINEPHRINE (PF) 2 %-1:200000 IJ SOLN
10.0000 mL | Freq: Once | INTRAMUSCULAR | Status: AC
  Administered 2020-06-29: 10 mL
  Filled 2020-06-29: qty 10

## 2020-06-29 MED ORDER — METHYLPREDNISOLONE 4 MG PO TBPK: ORAL_TABLET | ORAL | 0 refills | Status: DC

## 2020-06-29 MED ORDER — TETANUS-DIPHTH-ACELL PERTUSSIS 5-2.5-18.5 LF-MCG/0.5 IM SUSP
0.5000 mL | Freq: Once | INTRAMUSCULAR | Status: AC
  Administered 2020-06-29: 0.5 mL via INTRAMUSCULAR
  Filled 2020-06-29: qty 0.5

## 2020-06-29 MED ORDER — DOUBLE ANTIBIOTIC 500-10000 UNIT/GM EX OINT
TOPICAL_OINTMENT | Freq: Once | CUTANEOUS | Status: AC
  Filled 2020-06-29: qty 1

## 2020-06-29 NOTE — ED Provider Notes (Signed)
[facility] EMERGENCY DEPARTMENT Provider Note   CSN: [id number] Arrival date & time: 06/29/20  1832     History Chief Complaint  Patient presents with  . Laceration    [name] is a 79 y.o. female.  HPI 79 year old female presents with trip and fall.  She was working and her ankle got caught on the concrete and she fell.  Right-sided head and face injury.  She is not sure if she lost consciousness.  She states she has a mild headache that is improving.  No leg or other injury.  She is not on blood thinners.  No eye pain or vision change except that her eye is swollen.  No neck pain. Last tetanus immunization was 2016.   Past Medical History:  Diagnosis Date  . ALLERGIC RHINITIS 11/30/2008  . ALLERGIC RHINITIS 11/30/2008  . Anxiety   . ASTHMA UNSPECIFIED WITH EXACERBATION 02/17/2008   pt. states she does not and has never has asthma  . CAROTID BRUIT, LEFT 04/19/2007  . Closed fracture of lateral malleolus 06/04/2009  . CLOSED FRACTURE OF METATARSAL BONE 06/04/2009  . Depression   . Esophageal reflux 11/08/2008  . GANGLION CYST 04/19/2007  . HYPERTENSION 04/07/2007  . Hypothyroidism   . INSOMNIA 08/07/2010  . OSTEOARTHRITIS 04/07/2007  . OSTEOPENIA 03/10/2008  . PONV (postoperative nausea and vomiting)   . UTI 07/20/2009    Patient Active Problem List   Diagnosis Date Noted  . S/P reverse total shoulder arthroplasty, left 09/09/2018  . Hypothyroidism, postop 11/19/2016  . Cyst in hand 08/23/2014  . Preventative health care 07/10/2014  . Attention deficit disorder 07/10/2014  . Obesity (BMI 30-39.9) 12/26/2013  . GERD (gastroesophageal reflux disease) 11/17/2012  . Depression with anxiety 10/28/2010  . INSOMNIA 08/07/2010  . ALLERGIC RHINITIS 11/30/2008  . OSTEOPENIA 03/10/2008  . Hyperlipidemia 04/19/2007  . Left carotid bruit 04/19/2007  . Essential hypertension 04/07/2007  . Osteoarthritis 04/07/2007    Past Surgical History:  Procedure Laterality Date  . EYE  SURGERY Bilateral    cataract removal  . FUNCTIONAL ENDOSCOPIC SINUS SURGERY  01/2018   spinal fluid leaking behind right eye  . JOINT REPLACEMENT     knuckle  . REVERSE SHOULDER ARTHROPLASTY Left 09/09/2018  . REVERSE SHOULDER ARTHROPLASTY Left 09/09/2018   Procedure: REVERSE SHOULDER ARTHROPLASTY;  Surgeon: Justice Britain, MD;  Location: [facility];  Service: Orthopedics;  Laterality: Left;  126min  . TOTAL HIP ARTHROPLASTY    . TUBAL LIGATION       OB History   No obstetric history on file.     Family History  Problem Relation Age of Onset  . Heart disease Mother        Mother also has significant carotid artery stenosis  . COPD Father   . Alcoholism Father   . Cancer Brother   . Alzheimer's disease Sister   . Thyroid disease Sister        Goiter  . Breast cancer Neg Hx     Social History   Tobacco Use  . Smoking status: Former Smoker    Packs/day: 1.50    Years: 20.00    Pack years: 30.00    Types: Cigarettes    Quit date: 09/15/1968    Years since quitting: 51.8  . Smokeless tobacco: Never Used  . Tobacco comment: smoked 1.5 for  20 years  Vaping Use  . Vaping Use: Never used  Substance Use Topics  . Alcohol use: No  . Drug use: No  Home Medications Prior to Admission medications   Medication Sig Start Date End Date Taking? Authorizing Provider  amphetamine-dextroamphetamine (ADDERALL) 30 MG tablet Take 1 tablet by mouth 2 (two) times daily. Fill in 2 months Patient taking differently: Take 30 mg by mouth 2 (two) times daily.  12/07/17   Marletta Lor, MD  aspirin-acetaminophen-caffeine (EXCEDRIN MIGRAINE) [phone] MG per tablet Take 1 tablet by mouth every 6 (six) hours as needed. Patient taking differently: Take 1 tablet by mouth daily as needed for headache or migraine.  09/02/13   Ricard Dillon, MD  B Complex-Folic Acid (B COMPLEX PLUS) TABS Take 1 tablet by mouth daily.    [provider]  buPROPion (WELLBUTRIN XL) 300 MG 24 hr tablet  TAKE 1 TABLET BY MOUTH ONCE DAILY Patient taking differently: Take 300 mg by mouth daily.  03/02/18   Marletta Lor, MD  calcium-vitamin D (OSCAL WITH D) 500-200 MG-UNIT tablet Take 1 tablet by mouth daily with breakfast.    [provider]  Cholecalciferol (VITAMIN D3) 125 MCG (5000 UT) CAPS Take 5,000 Units by mouth daily.    [provider]  diclofenac sodium (VOLTAREN) 1 % GEL APPLY   TOPICALLY TO AFFECTED AREA 4 TIMES DAILY AS NEEDED FOR PAIN Patient taking differently: Apply 1 g topically 4 (four) times daily as needed. FOR PAIN 12/07/17   Marletta Lor, MD  FLUoxetine (PROZAC) 20 MG capsule TAKE ONE CAPSULE BY MOUTH ONCE DAILY Patient taking differently: Take 20 mg by mouth daily.  03/02/18   Marletta Lor, MD  hydrochlorothiazide (MICROZIDE) 12.5 MG capsule Take 12.5 mg by mouth daily. TAKE 1 TABLET BY MOUTH ONCE DAILY.    [provider]  levothyroxine (SYNTHROID, LEVOTHROID) 112 MCG tablet TAKE 1 TABLET BY MOUTH ONCE DAILY BEFORE BREAKFAST Patient taking differently: Take 112 mcg by mouth daily before breakfast.  11/10/17   Elayne Snare, MD  liothyronine (CYTOMEL) 5 MCG tablet Take 1 tablet by mouth once daily, and then 2 tabs the next day alternating. 04/18/20   Elayne Snare, MD  losartan (COZAAR) 100 MG tablet Take 100 mg by mouth daily. TAKE 1 TABLET BY MOUTH ONCE DAILY.    [provider]  methylPREDNISolone (MEDROL DOSEPAK) 4 MG TBPK tablet Take per box instructions 06/29/20   Sherwood Gambler, MD  Multiple Vitamin (MULTIVITAMIN) capsule Take 1 capsule by mouth daily. Centrum Silver    [provider]  OVER THE COUNTER MEDICATION Take 1 tablet by mouth daily. Joint Support tablet includes:  Primal Celadrine turmeric    [provider]  Polyethyl Glycol-Propyl Glycol (SYSTANE) 0.4-0.3 % SOLN Place 2 drops into both eyes 2 (two) times daily.    [provider]    Allergies    Patient has no active  allergies.  Review of Systems   Review of Systems  HENT: Positive for facial swelling.   Eyes: Negative for pain.  Musculoskeletal: Negative for neck pain.  Neurological: Positive for headaches.  All other systems reviewed and are negative.   Physical Exam Updated Vital Signs BP (!) 157/80   Pulse 86   Temp 97.9 F (36.6 C) (Oral)   Resp 19   Ht 5' 4.5" (1.638 m)   Wt 76.2 kg   SpO2 98%   BMI 28.39 kg/m   Physical Exam Vitals and nursing note reviewed.  Constitutional:      Appearance: She is well-developed.  HENT:     Head: Normocephalic.      Right  Ear: External ear normal.     Left Ear: External ear normal.     Nose: Nose normal.  Eyes:     General:        Right eye: No discharge.        Left eye: No discharge.     Extraocular Movements: Extraocular movements intact.     Pupils: Pupils are equal, round, and reactive to light.     Comments: Mild right periorbital swelling but the globe appears normal.  Cardiovascular:     Rate and Rhythm: Normal rate and regular rhythm.     Heart sounds: Normal heart sounds.  Pulmonary:     Effort: Pulmonary effort is normal.     Breath sounds: Normal breath sounds.  Abdominal:     Palpations: Abdomen is soft.     Tenderness: There is no abdominal tenderness.  Musculoskeletal:     Cervical back: No tenderness.  Skin:    General: Skin is warm and dry.  Neurological:     Mental Status: She is alert.     Comments: CN 3-12 grossly intact. 5/5 strength in all 4 extremities. Grossly normal sensation. Normal finger to nose.   Psychiatric:        Mood and Affect: Mood is not anxious.     ED Results / Procedures / Treatments   Labs (all labs ordered are listed, but only abnormal results are displayed) Labs Reviewed - No data to display  EKG None  Radiology CT Head Wo Contrast  Result Date: 06/29/2020 CLINICAL DATA:  Facial trauma. EXAM: CT HEAD WITHOUT CONTRAST CT MAXILLOFACIAL WITHOUT CONTRAST TECHNIQUE:  Multidetector CT imaging of the head and maxillofacial structures were performed using the standard protocol without intravenous contrast. Multiplanar CT image reconstructions of the maxillofacial structures were also generated. COMPARISON:  Face CT 10/28/2017 FINDINGS: CT HEAD FINDINGS Brain: No evidence of acute infarction, hemorrhage, hydrocephalus, extra-axial collection or mass lesion/mass effect. 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 Cerebral volume loss and chronic small vessel disease. Vascular: No hyperdense vessel or unexpected calcification. Skull: See below CT MAXILLOFACIAL FINDINGS Osseous: Blowout fracture of the right orbital floor with segmental fragment that is depressed. There is herniation of orbital fat and herniation of the inferior rectus which is rounded. There is continuation to the anterior right maxillary sinus wall where there is also a depressed segmental fracture. No complete tripod fracture. The mandible is intact and located. Remote right nasal bone fracture. Known bony defect in the right sphenoid sinus at the lateral recess. Orbits: Orbital gas and swelling related to the fracture. No discrete orbital hematoma or proptosis proptosis. No evidence of globe injury. Bilateral cataract resection. Inferior rectus deformity on the right as noted above Sinuses: Known cephalocele at the right sphenoid sinus with right sphenoid sinus fluid level. Postoperative paranasal sinuses with right maxillary antrostomy, bilateral sphenoidotomy, ethmoidectomies, and a large nasal septal defect. Soft tissues: Soft tissue contusion to the right cheek with soft tissue emphysema. Benign submandibular calcifications on the right. IMPRESSION: 1. Segmental fracture of the right orbital floor continuing to the anterior wall. The floor fragment is depressed with herniation of orbital fat and the  inferior rectus. 2. 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 Right sphenoid sinus fluid level continues to suggest CSF leak. 3. No evidence of intracranial injury. Electronically Signed   By: Neva Seat.D.  On: 06/29/2020 20:27   CT Maxillofacial Wo Contrast  Result Date: 06/29/2020 CLINICAL DATA:  Facial trauma. EXAM: CT HEAD WITHOUT CONTRAST CT MAXILLOFACIAL WITHOUT CONTRAST TECHNIQUE: Multidetector CT imaging of the head and maxillofacial structures were performed using the standard protocol without intravenous contrast. Multiplanar CT image reconstructions of the maxillofacial structures were also generated. COMPARISON:  Face CT 10/28/2017 FINDINGS: CT HEAD FINDINGS Brain: No evidence of acute infarction, hemorrhage, hydrocephalus, extra-axial collection or mass lesion/mass effect. 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 Cerebral volume loss and chronic small vessel disease. Vascular: No hyperdense vessel or unexpected calcification. Skull: See below CT MAXILLOFACIAL FINDINGS Osseous: Blowout fracture of the right orbital floor with segmental fragment that is depressed. There is herniation of orbital fat and herniation of the inferior rectus which is rounded. There is continuation to the anterior right maxillary sinus wall where there is also a depressed segmental fracture. No complete tripod fracture. The mandible is intact and located. Remote right nasal bone fracture. Known bony defect in the right sphenoid sinus at the lateral recess. Orbits: Orbital gas and swelling related to the fracture. No discrete orbital hematoma or proptosis proptosis. No evidence of globe injury. Bilateral cataract resection. Inferior rectus deformity on the right as noted above Sinuses: Known cephalocele at the right sphenoid sinus with right sphenoid sinus fluid level.  Postoperative paranasal sinuses with right maxillary antrostomy, bilateral sphenoidotomy, ethmoidectomies, and a large nasal septal defect. Soft tissues: Soft tissue contusion to the right cheek with soft tissue emphysema. Benign submandibular calcifications on the right. IMPRESSION: 1. Segmental fracture of the right orbital floor continuing to the anterior wall. The floor fragment is depressed with herniation of orbital fat and the inferior rectus. 2. 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 Right sphenoid sinus fluid level continues to suggest CSF leak. 3. No evidence of intracranial injury. Electronically Signed   By: Monte Fantasia M.D.   On: 06/29/2020 20:27    Procedures .Marland KitchenLaceration Repair  Date/Time: 06/29/2020 7:30 PM Performed by: Sherwood Gambler, MD Authorized by: Sherwood Gambler, MD   Consent:    Consent obtained:  Verbal   Consent given by:  Patient Anesthesia (see MAR for exact dosages):    Anesthesia method:  Local infiltration   Local anesthetic:  Lidocaine 2% WITH epi Laceration details:    Location:  Face   Face location:  R eyebrow   Length (cm):  1 Repair type:    Repair type:  Simple Exploration:    Contaminated: no   Treatment:    Area cleansed with:  Saline   Amount of cleaning:  Standard   Irrigation solution:  Sterile saline   Irrigation method:  Syringe Skin repair:    Repair method:  Sutures   Suture size:  5-0   Suture material:  Fast-absorbing gut   Suture technique:  Simple interrupted   Number of sutures:  2 Approximation:    Approximation:  Close Post-procedure details:    Dressing:  Antibiotic ointment   Patient tolerance of procedure:  Tolerated well, no immediate complications   (including critical care time)  Medications Ordered in ED Medications  lidocaine-EPINEPHrine (XYLOCAINE W/EPI) 2 %-1:200000 (PF) injection 10 mL (10 mLs Infiltration Given 06/29/20 1939)  Tdap (BOOSTRIX) injection 0.5 mL (0.5 mLs  Intramuscular Given 06/29/20 1939)  polymixin-bacitracin (POLYSPORIN) ointment ( Topical Given 06/29/20 1940)    ED Course  I have reviewed the triage vital signs and the nursing notes.  Pertinent labs & imaging results that were available during my care of the patient were reviewed by me and considered in my medical decision making (see chart for details).    MDM Rules/Calculators/A&P                          Besides seeing through the swelling around her eye, patient is not having any difficulty with vision.  Extraocular movements are intact.  Laceration was repaired as above.  CT head and maxillofacial have been reviewed and do show the orbital floor fracture.  I discussed with Dr. Fredric Dine, who advised treating with ice, medrol dose pack. No antibiotics. Follow up with her in 1-2 weeks. Follow up with her eye doctor as well. Patient otherwise appears well, and is stable for discharge.  Final Clinical Impression(s) / ED Diagnoses Final diagnoses:  Closed fracture of right orbital floor, initial encounter ([place])  Laceration of eyebrow and forehead, right, initial encounter    Rx / DC Orders ED Discharge Orders         Ordered    methylPREDNISolone (MEDROL DOSEPAK) 4 MG TBPK tablet        06/29/20 2115           Sherwood Gambler, MD 06/29/20 2209

## 2020-06-29 NOTE — ED Notes (Signed)
edp in room  

## 2020-06-29 NOTE — ED Triage Notes (Signed)
Pt states she twisted her ankle while walking causing her to fall and hit her head on the concrete; pt has a laceration to right side of head and swelling to right eye; pt denies any loc

## 2020-06-29 NOTE — Discharge Instructions (Addendum)
Orbital Floor Fracture                An orbital floor fracture is a break in the bottom part of the bony structure that protects the eye (orbital floor). The orbital floor separates the eye from the sinus. If the fracture in the orbital floor allows nearby muscle, tissue, or both to get trapped inside of the fracture, it is called an orbital floor fracture with entrapment. This usually causes swelling and pain, and it often affects vision. Orbital floor fracture with entrapment may also be called a trapdoor fracture. This is more commonly seen in children and may cause a nervous system reaction that requires immediate treatment.  What are the causes? This condition is caused by an injury to the eye, such as a hard, direct hit.  What are the signs or symptoms? Symptoms of this condition include:  Swelling and bruising around the eye.  Pain or tenderness around the eye.  Difficulty looking up, down, or side to side.  Changes in vision, such as blurry vision or seeing two of everything (double vision).  The injured eye looking sunken compared to the other eye.  Numbness of the cheek, inner nose, and upper gum on the same side of the face as the injury. How is this diagnosed? This condition may be diagnosed based on:  Your symptoms and medical history.  An eye exam.  An X-ray or a CT scan. How is this treated? Treatment for this condition depends on the severity and type of fracture, if there is entrapment, and how old you are.  Orbital floor fracture without entrapment Treatment is usually not needed if there is no entrapment. In almost all cases, the broken bone heals on its own with time. To help manage symptoms, treatment may include:  Applying ice to the injured area.  Taking medicines for pain relief..  Taking steroids to reduce swelling.  Making an appointment with an eye specialist (ophthalmologist) for follow-up care.  Orbital floor fracture with  entrapment If there is entrapment, treatment is usually started after all the swelling around the eye has gone away, which may take 1-2 weeks. After swelling goes away, an ophthalmologist may try to free the entrapped tissue if you still have double vision. If this is not possible, you may need surgery. If you have double vision only when looking up, your health care provider will discuss treatment options with you. Some people who do not spend a lot of time looking up choose not to have more treatment. Others who need to look up often for work, such as Engineer, civil (consulting), need treatment.  Orbital trapdoor fracture You may need surgery sooner if you have a trapdoor fracture or have the following symptoms:  Nausea or vomiting.  A slow heart rate.  Dizziness.  Loss of consciousness. Follow these instructions at home: Activity  Do not drive or operate machinery until your health care provider says that it is safe. Be aware that if you are only using one eye to see, you may have trouble judging distances (depth perception). Ask your health care provider when it is safe to drive.  Avoid traveling by plane or going to high-altitude areas. This may slow the healing of your swelling and may increase sinus pain.  Return to your normal activities as told by your health care provider. Ask your health care provider what activities are safe for you.  Follow instructions from your health care provider about wearing protective glasses or goggles for  work.   Managing pain, bruising, and swelling   If directed, put ice on the injured eye. To do this: ? Put ice in a plastic bag. ? Place a towel between your skin and the bag. ? Leave the ice on for 20 minutes, 2-3 times a day.  If recommended by your health care provider, sleep with one or two extra pillows under your head. Keeping your head raised slightly when lying down can help with swelling and pain.  Medicines  Take over-the-counter and  prescription medicines only as told by your health care provider.  If you were prescribed an antibiotic medicine, take it as told by your health care provider. Do not stop taking the antibiotic even if you start to feel better.  General instructions  Do not touch, rub, or try to move your eye.  Do not blow your nose until your health care provider says it is okay.  Do not put a contact lens in the injured eye until your health care provider says it is okay.  Stay away from dusty areas.  Keep all follow-up visits as told by your health care provider. This is important.  Contact a health care provider if:  Your vision changes, such as increased blurriness or worsening double vision.  You feel pain when you move your eyes.  You have nausea or feel light-headed when you move your eyes.  You have redness or swelling that does not go away or gets worse.  You have blood or fluid coming from your nose.  You have a fever.  You have a headache. Get help right away if you:  Have a sensation that you are seeing flashing lights.  Have sudden blindness.  Have sudden bulging of the injured eye.  Notice that your heart is beating much slower than normal.  Have dizziness.  Pass out.  Have chest pain.  Are short of breath.  Summary  An orbital floor fracture is a break in the bottom part of the bony structure that protects the eye (orbital floor).  This condition may cause swelling, bruising, and pain around the injured eye. You may also have trouble looking up, down, or side to side.  Treatment depends on the severity and type of fracture, if there is entrapment, and how old you are.  You should notdrive or perform your regular activities until your health care provider says that it is safe. This information is not intended to replace advice given to you by your health care provider. Make sure you discuss any questions you have with your health care provider. Document  Revised: 04/13/2019 Document Reviewed: 07/16/2017 Elsevier Patient Education  [place].

## 2020-07-03 DIAGNOSIS — H16223 Keratoconjunctivitis sicca, not specified as Sjogren's, bilateral: Secondary | ICD-10-CM | POA: Diagnosis not present

## 2020-07-03 DIAGNOSIS — M25511 Pain in right shoulder: Secondary | ICD-10-CM | POA: Diagnosis not present

## 2020-07-03 DIAGNOSIS — E039 Hypothyroidism, unspecified: Secondary | ICD-10-CM | POA: Diagnosis not present

## 2020-07-03 DIAGNOSIS — F331 Major depressive disorder, recurrent, moderate: Secondary | ICD-10-CM | POA: Diagnosis not present

## 2020-07-03 DIAGNOSIS — F902 Attention-deficit hyperactivity disorder, combined type: Secondary | ICD-10-CM | POA: Diagnosis not present

## 2020-07-03 DIAGNOSIS — F411 Generalized anxiety disorder: Secondary | ICD-10-CM | POA: Diagnosis not present

## 2020-07-03 DIAGNOSIS — S2001XD Contusion of right breast, subsequent encounter: Secondary | ICD-10-CM | POA: Diagnosis not present

## 2020-07-03 DIAGNOSIS — I10 Essential (primary) hypertension: Secondary | ICD-10-CM | POA: Diagnosis not present

## 2020-07-11 DIAGNOSIS — S0231XA Fracture of orbital floor, right side, initial encounter for closed fracture: Secondary | ICD-10-CM | POA: Diagnosis not present

## 2020-07-11 DIAGNOSIS — Z87891 Personal history of nicotine dependence: Secondary | ICD-10-CM | POA: Diagnosis not present

## 2020-07-11 DIAGNOSIS — S0230XA Fracture of orbital floor, unspecified side, initial encounter for closed fracture: Secondary | ICD-10-CM | POA: Diagnosis not present

## 2020-07-11 DIAGNOSIS — J3489 Other specified disorders of nose and nasal sinuses: Secondary | ICD-10-CM | POA: Diagnosis not present

## 2020-07-12 ENCOUNTER — Other Ambulatory Visit: Payer: Self-pay | Admitting: Endocrinology

## 2020-07-12 DIAGNOSIS — E890 Postprocedural hypothyroidism: Secondary | ICD-10-CM

## 2020-08-01 DIAGNOSIS — M8580 Other specified disorders of bone density and structure, unspecified site: Secondary | ICD-10-CM | POA: Diagnosis not present

## 2020-08-01 DIAGNOSIS — R944 Abnormal results of kidney function studies: Secondary | ICD-10-CM | POA: Diagnosis not present

## 2020-08-01 DIAGNOSIS — H538 Other visual disturbances: Secondary | ICD-10-CM | POA: Diagnosis not present

## 2020-08-01 DIAGNOSIS — J029 Acute pharyngitis, unspecified: Secondary | ICD-10-CM | POA: Diagnosis not present

## 2020-08-01 DIAGNOSIS — N390 Urinary tract infection, site not specified: Secondary | ICD-10-CM | POA: Diagnosis not present

## 2020-08-01 DIAGNOSIS — E039 Hypothyroidism, unspecified: Secondary | ICD-10-CM | POA: Diagnosis not present

## 2020-08-01 DIAGNOSIS — Z6827 Body mass index (BMI) 27.0-27.9, adult: Secondary | ICD-10-CM | POA: Diagnosis not present

## 2020-08-01 DIAGNOSIS — S2001XD Contusion of right breast, subsequent encounter: Secondary | ICD-10-CM | POA: Diagnosis not present

## 2020-08-01 DIAGNOSIS — I10 Essential (primary) hypertension: Secondary | ICD-10-CM | POA: Diagnosis not present

## 2020-08-01 DIAGNOSIS — J302 Other seasonal allergic rhinitis: Secondary | ICD-10-CM | POA: Diagnosis not present

## 2020-08-01 DIAGNOSIS — R319 Hematuria, unspecified: Secondary | ICD-10-CM | POA: Diagnosis not present

## 2020-08-01 DIAGNOSIS — F331 Major depressive disorder, recurrent, moderate: Secondary | ICD-10-CM | POA: Diagnosis not present

## 2020-08-02 DIAGNOSIS — F331 Major depressive disorder, recurrent, moderate: Secondary | ICD-10-CM | POA: Diagnosis not present

## 2020-08-02 DIAGNOSIS — F902 Attention-deficit hyperactivity disorder, combined type: Secondary | ICD-10-CM | POA: Diagnosis not present

## 2020-08-02 DIAGNOSIS — M25511 Pain in right shoulder: Secondary | ICD-10-CM | POA: Diagnosis not present

## 2020-08-02 DIAGNOSIS — S2001XD Contusion of right breast, subsequent encounter: Secondary | ICD-10-CM | POA: Diagnosis not present

## 2020-08-02 DIAGNOSIS — I10 Essential (primary) hypertension: Secondary | ICD-10-CM | POA: Diagnosis not present

## 2020-08-02 DIAGNOSIS — F411 Generalized anxiety disorder: Secondary | ICD-10-CM | POA: Diagnosis not present

## 2020-08-02 DIAGNOSIS — E039 Hypothyroidism, unspecified: Secondary | ICD-10-CM | POA: Diagnosis not present

## 2020-08-30 DIAGNOSIS — I10 Essential (primary) hypertension: Secondary | ICD-10-CM | POA: Diagnosis not present

## 2020-08-30 DIAGNOSIS — F331 Major depressive disorder, recurrent, moderate: Secondary | ICD-10-CM | POA: Diagnosis not present

## 2020-08-30 DIAGNOSIS — E039 Hypothyroidism, unspecified: Secondary | ICD-10-CM | POA: Diagnosis not present

## 2020-08-30 DIAGNOSIS — M25511 Pain in right shoulder: Secondary | ICD-10-CM | POA: Diagnosis not present

## 2020-08-30 DIAGNOSIS — F411 Generalized anxiety disorder: Secondary | ICD-10-CM | POA: Diagnosis not present

## 2020-08-30 DIAGNOSIS — S2001XD Contusion of right breast, subsequent encounter: Secondary | ICD-10-CM | POA: Diagnosis not present

## 2020-08-30 DIAGNOSIS — F902 Attention-deficit hyperactivity disorder, combined type: Secondary | ICD-10-CM | POA: Diagnosis not present

## 2020-09-03 NOTE — Progress Notes (Deleted)
Patient ID: [name], female   DOB: 29-Jun-1941, 79 y.o.   MRN: [id number]    Reason for Appointment:  Follow-up of thyroid and hypercalcemia       History of Present Illness:   THYROID  Previous history: She had a low TSH level as far back as 7 years ago She had symptoms of feeling hot and sweaty with physical activity, some fatigue and palpitations She has had long history of anxiety and depression   Because of her symptoms and TSH in the hyperthyroid range as well as uptake of 34% she was given I-131 treatment with 30 mCi on 09/27/14. Subsequently she was subjectively better When TSH  was  as high as 15.9 she was started on 75 g levothyroxine in 12/2014  Recent history:  She had progressive increase in her levothyroxine dose since 2017 The dose was increased in 11/17 and also in 2/18, was eventually taking 137 g On her visit in 7/18 she was complaining of feeling consistently tired although has an occasional day where she feels better She also thinks she was losing hair, no cold intolerance   Since she had a relatively low  T3 level  she was switched from levothyroxine 137 to Cytomel 5 g along with levothyroxine 112 g in July 2018  With her new combination regimen she initially felt less tired overall and had less hair loss  When her TSH was slightly higher and free T3 low normal in 12/20 she was told to increase her Cytomel She is now taking 2 tablets of the liothyronine 5 mcg dose She did not come back for follow-up after her labs were done in March  As before she is complaining of continued fatigue and difficulty with focusing mentally  She has continued problems with significant insomnia and her emotional state is not as good, she is also more irritable Weight is unchanged She has been quite regular with taking her thyroid supplements in the morning  Her TSH is 2.9 and her free T3 and T4 are fairly good  Wt Readings from Last 3 Encounters:   06/29/20 168 lb (76.2 kg)  03/08/20 173 lb (78.5 kg)  11/04/18 171 lb 9.6 oz (77.8 kg)    Lab Results  Component Value Date   TSH 2.94 02/29/2020   TSH 5.54 11/21/2019   TSH 3.47 08/23/2019   FREET4 0.91 02/29/2020   FREET4 1.2 08/23/2019   FREET4 1.3 05/03/2019    Lab Results  Component Value Date   T3FREE 3.3 02/29/2020   T3FREE 2.5 08/23/2019   T3FREE 2.8 05/03/2019   T3FREE 3.0 11/04/2018   HYPERCALCEMIA: Likely had started having hypercalcemia after 2017  However PTH level just over 20 on the last measurement She does take 12.5 mg HCTZ for hypertension  She is taking 5000 units of vitamin D3  No history of osteoporosis, bone density done by PCP showed only osteopenia done in 02/2018 with lowest T score -1.9  Her calcium is stable and slightly above normal range again  Lab Results  Component Value Date   CALCIUM 10.7 (H) 02/29/2020   CALCIUM 10.6 (H) 08/23/2019   CALCIUM 10.8 (H) 05/03/2019   CALCIUM 11.1 (H) 11/04/2018   CALCIUM 10.3 09/02/2018   CALCIUM 9.7 05/23/2016   CALCIUM 9.9 03/09/2015   Lab Results  Component Value Date   PTH 22 11/04/2018   CALCIUM 10.7 (H) 02/29/2020      Allergies as of 09/04/2020   No Active Allergies  Medication List       Accurate as of September 03, 2020  4:51 PM. If you have any questions, ask your nurse or doctor.        amphetamine-dextroamphetamine 30 MG tablet Commonly known as: Adderall Take 1 tablet by mouth 2 (two) times daily. Fill in 2 months What changed: additional instructions   aspirin-acetaminophen-caffeine 250-250-65 MG tablet Commonly known as: EXCEDRIN MIGRAINE Take 1 tablet by mouth every 6 (six) hours as needed. What changed:   when to take this  reasons to take this   B Complex Plus Tabs Take 1 tablet by mouth daily.   buPROPion 300 MG 24 hr tablet Commonly known as: WELLBUTRIN XL TAKE 1 TABLET BY MOUTH ONCE DAILY   calcium-vitamin D 500-200 MG-UNIT tablet Commonly known  as: OSCAL WITH D Take 1 tablet by mouth daily with breakfast.   diclofenac sodium 1 % Gel Commonly known as: Voltaren APPLY   TOPICALLY TO AFFECTED AREA 4 TIMES DAILY AS NEEDED FOR PAIN What changed:   how much to take  how to take this  when to take this  reasons to take this  additional instructions   FLUoxetine 20 MG capsule Commonly known as: PROZAC TAKE ONE CAPSULE BY MOUTH ONCE DAILY   hydrochlorothiazide 12.5 MG capsule Commonly known as: MICROZIDE Take 12.5 mg by mouth daily. TAKE 1 TABLET BY MOUTH ONCE DAILY.   levothyroxine 112 MCG tablet Commonly known as: SYNTHROID TAKE 1 TABLET BY MOUTH ONCE DAILY BEFORE BREAKFAST What changed:   how much to take  how to take this  when to take this  additional instructions   liothyronine 5 MCG tablet Commonly known as: CYTOMEL TAKE 1 TABLET BY MOUTH ON DAY 1 AND 2 TABLETS BY MOUTH ON DAY 2, ALTERNATING DAYS   losartan 100 MG tablet Commonly known as: COZAAR Take 100 mg by mouth daily. TAKE 1 TABLET BY MOUTH ONCE DAILY.   methylPREDNISolone 4 MG Tbpk tablet Commonly known as: MEDROL DOSEPAK Take per box instructions   multivitamin capsule Take 1 capsule by mouth daily. Centrum Silver   OVER THE COUNTER MEDICATION Take 1 tablet by mouth daily. Joint Support tablet includes:  Primal Celadrine turmeric   Systane 0.4-0.3 % Soln Generic drug: Polyethyl Glycol-Propyl Glycol Place 2 drops into both eyes 2 (two) times daily.   Vitamin D3 125 MCG (5000 UT) Caps Take 5,000 Units by mouth daily.       Allergies:  No Active Allergies  Past Medical History:  Diagnosis Date  . ALLERGIC RHINITIS 11/30/2008  . ALLERGIC RHINITIS 11/30/2008  . Anxiety   . ASTHMA UNSPECIFIED WITH EXACERBATION 02/17/2008   pt. states she does not and has never has asthma  . CAROTID BRUIT, LEFT 04/19/2007  . Closed fracture of lateral malleolus 06/04/2009  . CLOSED FRACTURE OF METATARSAL BONE 06/04/2009  . Depression   . Esophageal  reflux 11/08/2008  . GANGLION CYST 04/19/2007  . HYPERTENSION 04/07/2007  . Hypothyroidism   . INSOMNIA 08/07/2010  . OSTEOARTHRITIS 04/07/2007  . OSTEOPENIA 03/10/2008  . PONV (postoperative nausea and vomiting)   . UTI 07/20/2009    Past Surgical History:  Procedure Laterality Date  . EYE SURGERY Bilateral    cataract removal  . FUNCTIONAL ENDOSCOPIC SINUS SURGERY  01/2018   spinal fluid leaking behind right eye  . JOINT REPLACEMENT     knuckle  . REVERSE SHOULDER ARTHROPLASTY Left 09/09/2018  . REVERSE SHOULDER ARTHROPLASTY Left 09/09/2018   Procedure: REVERSE SHOULDER ARTHROPLASTY;  Surgeon: Justice Britain, MD;  Location: [facility];  Service: Orthopedics;  Laterality: Left;  112min  . TOTAL HIP ARTHROPLASTY    . TUBAL LIGATION      Family History  Problem Relation Age of Onset  . Heart disease Mother        Mother also has significant carotid artery stenosis  . COPD Father   . Alcoholism Father   . Cancer Brother   . Alzheimer's disease Sister   . Thyroid disease Sister        Goiter  . Breast cancer Neg Hx     Social History:  reports that she quit smoking about 52 years ago. Her smoking use included cigarettes. She has a 30.00 pack-year smoking history. She has never used smokeless tobacco. She reports that she does not drink alcohol and does not use drugs.   Review of Systems:   ROS     She is on multiple treatment for depression/ADHD and also complains of insomnia She also has ADHD on treatment  Followed by PCP for hypertension  Blood pressure readings:   BP Readings from Last 3 Encounters:  06/29/20 (!) 157/80  03/08/20 126/84  11/04/18 (!) 160/90    Examination:   There were no vitals taken for this visit.  Thyroid not palpable She has slightly brisk reflexes Mild tremor especially on the left  Assessment/Plan:    She has been on thyroid supplementation for post ablative hypothyroidism, baseline TSH 16  She has been on a regimen of 112 g of  levothyroxine and 10 g of Cytomel  Although she had felt better with the combination of T3 and T4 initially she is having persistent symptoms of fatigue and decreased concentration She has been quite regular with her doses of both supplements Discussed that her symptoms are not explained by her thyroid levels which are normal and improved compared to the last 2 levels including T3 level  She will continue same dose  Depression: She appears to be still having symptoms including low mood and irritability along with insomnia She needs to talk to her PCP or work with a psychiatrist for this  HYPERCALCEMIA: This may be related to HCTZ since her PTH was only 22 Calcium is now stable below 11  She will need to follow-up in 6 months unless having new symptoms  There are no Patient Instructions on file for this visit.     Elayne Snare 09/03/2020       Elayne Snare 09/03/2020

## 2020-09-04 ENCOUNTER — Ambulatory Visit: Payer: Medicare Other | Admitting: Endocrinology

## 2020-09-05 DIAGNOSIS — Z23 Encounter for immunization: Secondary | ICD-10-CM | POA: Diagnosis not present

## 2020-09-13 ENCOUNTER — Other Ambulatory Visit: Payer: Self-pay

## 2020-09-13 ENCOUNTER — Ambulatory Visit
Admission: EM | Admit: 2020-09-13 | Discharge: 2020-09-13 | Disposition: A | Discharge status: Home / Self Care | Payer: Medicare Other | Attending: Family Medicine | Admitting: Family Medicine

## 2020-09-13 ENCOUNTER — Encounter: Payer: Self-pay | Admitting: Emergency Medicine

## 2020-09-13 ENCOUNTER — Ambulatory Visit (INDEPENDENT_AMBULATORY_CARE_PROVIDER_SITE_OTHER): Payer: Medicare Other

## 2020-09-13 DIAGNOSIS — R059 Cough, unspecified: Secondary | ICD-10-CM

## 2020-09-13 DIAGNOSIS — R062 Wheezing: Secondary | ICD-10-CM | POA: Diagnosis not present

## 2020-09-13 DIAGNOSIS — J209 Acute bronchitis, unspecified: Secondary | ICD-10-CM | POA: Diagnosis not present

## 2020-09-13 DIAGNOSIS — Z1159 Encounter for screening for other viral diseases: Secondary | ICD-10-CM | POA: Diagnosis not present

## 2020-09-13 DIAGNOSIS — R0989 Other specified symptoms and signs involving the circulatory and respiratory systems: Secondary | ICD-10-CM | POA: Diagnosis not present

## 2020-09-13 DIAGNOSIS — Z1152 Encounter for screening for COVID-19: Secondary | ICD-10-CM | POA: Diagnosis not present

## 2020-09-13 DIAGNOSIS — R0981 Nasal congestion: Secondary | ICD-10-CM

## 2020-09-13 MED ORDER — DEXAMETHASONE SODIUM PHOSPHATE 10 MG/ML IJ SOLN
10.0000 mg | Freq: Once | INTRAMUSCULAR | Status: AC
  Administered 2020-09-13: 18:00:00 10 mg via INTRAMUSCULAR

## 2020-09-13 MED ORDER — PREDNISONE 10 MG (21) PO TBPK: ORAL_TABLET | Freq: Every day | ORAL | 0 refills | Status: AC

## 2020-09-13 MED ORDER — PROMETHAZINE-DM 6.25-15 MG/5ML PO SYRP: 5.0000 mL | ORAL_SOLUTION | Freq: Four times a day (QID) | ORAL | 0 refills | Status: DC | PRN

## 2020-09-13 NOTE — ED Provider Notes (Signed)
[facility]   [id number] 09/13/20 Arrival Time: [id number]   CC: COVID symptoms  SUBJECTIVE: History from: patient.  [name] is a 79 y.o. female who presents with abrupt onset of nasal congestion, PND, productive cough for the last 2 days. Denies sick exposure to COVID, flu or strep. Denies recent travel. Has negative history of Covid. Has completed Covid vaccines. Has not taken OTC medications for this. Cough is worse with activity. Reports previous symptoms in the past. Denies fever, sinus pain, rhinorrhea, nausea, changes in bowel or bladder habits.    ROS: As per HPI.  All other pertinent ROS negative.     Past Medical History:  Diagnosis Date  . ALLERGIC RHINITIS 11/30/2008  . ALLERGIC RHINITIS 11/30/2008  . Anxiety   . ASTHMA UNSPECIFIED WITH EXACERBATION 02/17/2008   pt. states she does not and has never has asthma  . CAROTID BRUIT, LEFT 04/19/2007  . Closed fracture of lateral malleolus 06/04/2009  . CLOSED FRACTURE OF METATARSAL BONE 06/04/2009  . Depression   . Esophageal reflux 11/08/2008  . GANGLION CYST 04/19/2007  . HYPERTENSION 04/07/2007  . Hypothyroidism   . INSOMNIA 08/07/2010  . OSTEOARTHRITIS 04/07/2007  . OSTEOPENIA 03/10/2008  . PONV (postoperative nausea and vomiting)   . UTI 07/20/2009   Past Surgical History:  Procedure Laterality Date  . EYE SURGERY Bilateral    cataract removal  . FUNCTIONAL ENDOSCOPIC SINUS SURGERY  01/2018   spinal fluid leaking behind right eye  . JOINT REPLACEMENT     knuckle  . REVERSE SHOULDER ARTHROPLASTY Left 09/09/2018  . REVERSE SHOULDER ARTHROPLASTY Left 09/09/2018   Procedure: REVERSE SHOULDER ARTHROPLASTY;  Surgeon: Justice Britain, MD;  Location: [facility];  Service: Orthopedics;  Laterality: Left;  143min  . TOTAL HIP ARTHROPLASTY    . TUBAL LIGATION     No Known Allergies No current facility-administered medications on file prior to encounter.   Current Outpatient Medications on File Prior to Encounter   Medication Sig Dispense Refill  . amphetamine-dextroamphetamine (ADDERALL) 30 MG tablet Take 1 tablet by mouth 2 (two) times daily. Fill in 2 months (Patient taking differently: Take 30 mg by mouth 2 (two) times daily. ) 60 tablet 0  . aspirin-acetaminophen-caffeine (EXCEDRIN MIGRAINE) 250-250-65 MG per tablet Take 1 tablet by mouth every 6 (six) hours as needed. (Patient taking differently: Take 1 tablet by mouth daily as needed for headache or migraine. ) 30 tablet 0  . B Complex-Folic Acid (B COMPLEX PLUS) TABS Take 1 tablet by mouth daily.    Marland Kitchen buPROPion (WELLBUTRIN XL) 300 MG 24 hr tablet TAKE 1 TABLET BY MOUTH ONCE DAILY (Patient taking differently: Take 300 mg by mouth daily. ) 90 tablet 1  . calcium-vitamin D (OSCAL WITH D) 500-200 MG-UNIT tablet Take 1 tablet by mouth daily with breakfast.    . Cholecalciferol (VITAMIN D3) 125 MCG (5000 UT) CAPS Take 5,000 Units by mouth daily.    . diclofenac sodium (VOLTAREN) 1 % GEL APPLY   TOPICALLY TO AFFECTED AREA 4 TIMES DAILY AS NEEDED FOR PAIN (Patient taking differently: Apply 1 g topically 4 (four) times daily as needed. FOR PAIN) 100 g 11  . FLUoxetine (PROZAC) 20 MG capsule TAKE ONE CAPSULE BY MOUTH ONCE DAILY (Patient taking differently: Take 20 mg by mouth daily. ) 90 capsule 1  . hydrochlorothiazide (MICROZIDE) 12.5 MG capsule Take 12.5 mg by mouth daily. TAKE 1 TABLET BY MOUTH ONCE DAILY.    Marland Kitchen levothyroxine (SYNTHROID, LEVOTHROID) 112 MCG tablet  TAKE 1 TABLET BY MOUTH ONCE DAILY BEFORE BREAKFAST (Patient taking differently: Take 112 mcg by mouth daily before breakfast. ) 90 tablet 1  . liothyronine (CYTOMEL) 5 MCG tablet TAKE 1 TABLET BY MOUTH ON DAY 1 AND 2 TABLETS BY MOUTH ON DAY 2, ALTERNATING DAYS 45 tablet 2  . losartan (COZAAR) 100 MG tablet Take 100 mg by mouth daily. TAKE 1 TABLET BY MOUTH ONCE DAILY.    . methylPREDNISolone (MEDROL DOSEPAK) 4 MG TBPK tablet Take per box instructions 21 each 0  . Multiple Vitamin (MULTIVITAMIN)  capsule Take 1 capsule by mouth daily. Centrum Silver    . OVER THE COUNTER MEDICATION Take 1 tablet by mouth daily. Joint Support tablet includes:  Primal Celadrine turmeric    . Polyethyl Glycol-Propyl Glycol (SYSTANE) 0.4-0.3 % SOLN Place 2 drops into both eyes 2 (two) times daily.     Social History   Socioeconomic History  . Marital status: Married    Spouse name: Not on file  . Number of children: Not on file  . Years of education: Not on file  . Highest education level: Not on file  Occupational History  . Occupation: retired    Fish farm manager: RETIRED  Tobacco Use  . Smoking status: Former Smoker    Packs/day: 1.50    Years: 20.00    Pack years: 30.00    Types: Cigarettes    Quit date: 09/15/1968    Years since quitting: 52.0  . Smokeless tobacco: Never Used  . Tobacco comment: smoked 1.5 for  20 years  Vaping Use  . Vaping Use: Never used  Substance and Sexual Activity  . Alcohol use: No  . Drug use: No  . Sexual activity: Yes  Other Topics Concern  . Not on file  Social History Narrative   Married for 50 years. She has 1 son that age 72   Retired from Environmental consultant         Social Determinants of Radio broadcast assistant Strain: Not on file  Food Insecurity: Not on file  Transportation Needs: Not on file  Physical Activity: Not on file  Stress: Not on file  Social Connections: Not on file  Intimate Partner Violence: Not on file   Family History  Problem Relation Age of Onset  . Heart disease Mother        Mother also has significant carotid artery stenosis  . COPD Father   . Alcoholism Father   . Cancer Brother   . Alzheimer's disease Sister   . Thyroid disease Sister        Goiter  . Breast cancer Neg Hx     OBJECTIVE:  Vitals:   09/13/20 1633 09/13/20 1635  BP:  (!) 169/95  Pulse:  77  Resp:  18  Temp:  98 F (36.7 C)  TempSrc:  Oral  SpO2:  96%  Weight: 167 lb 8.8 oz (76 kg)   Height: 5\' 4"  (1.626 m)      General  appearance: alert; appears fatigued, but nontoxic; speaking in full sentences and tolerating own secretions HEENT: NCAT; Ears: EACs clear, TMs pearly gray; Eyes: PERRL.  EOM grossly intact. Sinuses: nontender; Nose: nares patent without rhinorrhea, Throat: oropharynx erythematous, cobblestoning presetn, tonsils non erythematous or enlarged, uvula midline  Neck: supple without LAD Lungs: unlabored respirations, symmetrical air entry; cough: marked; no respiratory distress; wheezing and coarse lung sounds throughout bilateral lung fields, diminished lung sounds to bilateral lower lobes Heart: regular rate and rhythm.  Radial pulses 2+ symmetrical bilaterally Skin: warm and dry Psychological: alert and cooperative; normal mood and affect  LABS:  No results found for this or any previous visit (from the past 24 hour(s)).   ASSESSMENT & PLAN:  1. Acute bronchitis, unspecified organism   2. Nasal congestion   3. Cough   4. Wheezing   5. Encounter for screening for COVID-19     Meds ordered this encounter  Medications  . predniSONE (STERAPRED UNI-PAK 21 TAB) 10 MG (21) TBPK tablet    Sig: Take by mouth daily for 6 days. Take 6 tablets on day 1, 5 tablets on day 2, 4 tablets on day 3, 3 tablets on day 4, 2 tablets on day 5, 1 tablet on day 6    Dispense:  21 tablet    Refill:  0    Order Specific Question:   Supervising Provider    Answer:   Merrilee Jansky [id number]  . promethazine-dextromethorphan (PROMETHAZINE-DM) 6.25-15 MG/5ML syrup    Sig: Take 5 mLs by mouth 4 (four) times daily as needed for cough.    Dispense:  118 mL    Refill:  0    Order Specific Question:   Supervising Provider    Answer:   Merrilee Jansky [id number]  . dexamethasone (DECADRON) injection 10 mg   Chest xray negative for pneumonia Decadron 10mg  IM in office today Prescribed steroid taper Prescribed promethazine syrup Sedation precautions given Continue supportive care at home COVID and flu testing  ordered.  It will take between 1-2 days for test results.  Someone will contact you regarding abnormal results.    Patient should remain in quarantine until they have received Covid results.  If negative you may resume normal activities (go back to work/school) while practicing hand hygiene, social distance, and mask wearing.  If positive, patient should remain in quarantine for 10 days from symptom onset AND greater than 72 hours after symptoms resolution (absence of fever without the use of fever-reducing medication and improvement in respiratory symptoms), whichever is longer Get plenty of rest and push fluids Use OTC zyrtec for nasal congestion, runny nose, and/or sore throat Use OTC flonase for nasal congestion and runny nose Use medications daily for symptom relief Use OTC medications like ibuprofen or tylenol as needed fever or pain Call or go to the ED if you have any new or worsening symptoms such as fever, worsening cough, shortness of breath, chest tightness, chest pain, turning blue, changes in mental status.  Reviewed expectations re: course of current medical issues. Questions answered. Outlined signs and symptoms indicating need for more acute intervention. Patient verbalized understanding. After Visit Summary given.         , NP 09/16/20 [phone]

## 2020-09-13 NOTE — ED Triage Notes (Signed)
Cough, chills and fatigue that started a couple of nights ago

## 2020-09-13 NOTE — Discharge Instructions (Addendum)
Chest xray is negative for pneumonia  You have received a steroid injection in the office today to help with wheezing  I have sent in a prednisone taper for you to take for 6 days. 6 tablets on day one, 5 tablets on day two, 4 tablets on day three, 3 tablets on day four, 2 tablets on day five, and 1 tablet on day six.  I have sent in cough syrup for you to take. This medication can make you sleepy. Do not drive while taking this medication.  Your COVID and Flu tests are pending.  You should self quarantine until the test results are back.    Take Tylenol or ibuprofen as needed for fever or discomfort.  Rest and keep yourself hydrated.    Follow-up with your primary care provider if your symptoms are not improving.

## 2020-09-14 DIAGNOSIS — F411 Generalized anxiety disorder: Secondary | ICD-10-CM | POA: Diagnosis not present

## 2020-09-14 DIAGNOSIS — M1990 Unspecified osteoarthritis, unspecified site: Secondary | ICD-10-CM | POA: Diagnosis not present

## 2020-09-14 DIAGNOSIS — F902 Attention-deficit hyperactivity disorder, combined type: Secondary | ICD-10-CM | POA: Diagnosis not present

## 2020-09-14 DIAGNOSIS — R944 Abnormal results of kidney function studies: Secondary | ICD-10-CM | POA: Diagnosis not present

## 2020-09-14 DIAGNOSIS — K219 Gastro-esophageal reflux disease without esophagitis: Secondary | ICD-10-CM | POA: Diagnosis not present

## 2020-09-14 DIAGNOSIS — I10 Essential (primary) hypertension: Secondary | ICD-10-CM | POA: Diagnosis not present

## 2020-09-14 DIAGNOSIS — M7918 Myalgia, other site: Secondary | ICD-10-CM | POA: Diagnosis not present

## 2020-09-14 DIAGNOSIS — E039 Hypothyroidism, unspecified: Secondary | ICD-10-CM | POA: Diagnosis not present

## 2020-09-14 DIAGNOSIS — F331 Major depressive disorder, recurrent, moderate: Secondary | ICD-10-CM | POA: Diagnosis not present

## 2020-09-16 LAB — COVID-19, FLU A+B NAA
Influenza A, NAA: NOT DETECTED
Influenza B, NAA: NOT DETECTED
SARS-CoV-2, NAA: NOT DETECTED

## 2020-09-18 ENCOUNTER — Encounter (HOSPITAL_COMMUNITY): Payer: Medicare Other

## 2020-09-27 ENCOUNTER — Ambulatory Visit: Admit: 2020-09-27 | Payer: Medicare Other | Admitting: Orthopedic Surgery

## 2020-09-27 SURGERY — ARTHROPLASTY, SHOULDER, TOTAL, REVERSE
Anesthesia: General | Site: Shoulder | Laterality: Right

## 2020-11-12 DIAGNOSIS — I10 Essential (primary) hypertension: Secondary | ICD-10-CM | POA: Diagnosis not present

## 2020-11-12 DIAGNOSIS — F902 Attention-deficit hyperactivity disorder, combined type: Secondary | ICD-10-CM | POA: Diagnosis not present

## 2020-11-12 DIAGNOSIS — M25511 Pain in right shoulder: Secondary | ICD-10-CM | POA: Diagnosis not present

## 2020-11-12 DIAGNOSIS — S2001XD Contusion of right breast, subsequent encounter: Secondary | ICD-10-CM | POA: Diagnosis not present

## 2020-11-12 DIAGNOSIS — E039 Hypothyroidism, unspecified: Secondary | ICD-10-CM | POA: Diagnosis not present

## 2020-11-12 DIAGNOSIS — F411 Generalized anxiety disorder: Secondary | ICD-10-CM | POA: Diagnosis not present

## 2020-11-12 DIAGNOSIS — F331 Major depressive disorder, recurrent, moderate: Secondary | ICD-10-CM | POA: Diagnosis not present

## 2020-11-29 DIAGNOSIS — F331 Major depressive disorder, recurrent, moderate: Secondary | ICD-10-CM | POA: Diagnosis not present

## 2020-11-29 DIAGNOSIS — I10 Essential (primary) hypertension: Secondary | ICD-10-CM | POA: Diagnosis not present

## 2020-11-29 DIAGNOSIS — M25511 Pain in right shoulder: Secondary | ICD-10-CM | POA: Diagnosis not present

## 2020-11-29 DIAGNOSIS — R944 Abnormal results of kidney function studies: Secondary | ICD-10-CM | POA: Diagnosis not present

## 2020-11-29 DIAGNOSIS — N390 Urinary tract infection, site not specified: Secondary | ICD-10-CM | POA: Diagnosis not present

## 2020-11-29 DIAGNOSIS — J029 Acute pharyngitis, unspecified: Secondary | ICD-10-CM | POA: Diagnosis not present

## 2020-11-29 DIAGNOSIS — J302 Other seasonal allergic rhinitis: Secondary | ICD-10-CM | POA: Diagnosis not present

## 2020-11-29 DIAGNOSIS — M8580 Other specified disorders of bone density and structure, unspecified site: Secondary | ICD-10-CM | POA: Diagnosis not present

## 2020-11-29 DIAGNOSIS — E039 Hypothyroidism, unspecified: Secondary | ICD-10-CM | POA: Diagnosis not present

## 2020-11-29 DIAGNOSIS — H538 Other visual disturbances: Secondary | ICD-10-CM | POA: Diagnosis not present

## 2020-11-29 DIAGNOSIS — S2001XD Contusion of right breast, subsequent encounter: Secondary | ICD-10-CM | POA: Diagnosis not present

## 2020-11-29 DIAGNOSIS — F902 Attention-deficit hyperactivity disorder, combined type: Secondary | ICD-10-CM | POA: Diagnosis not present

## 2020-11-29 DIAGNOSIS — R319 Hematuria, unspecified: Secondary | ICD-10-CM | POA: Diagnosis not present

## 2020-11-29 DIAGNOSIS — Z6827 Body mass index (BMI) 27.0-27.9, adult: Secondary | ICD-10-CM | POA: Diagnosis not present

## 2020-11-29 DIAGNOSIS — F411 Generalized anxiety disorder: Secondary | ICD-10-CM | POA: Diagnosis not present

## 2020-12-12 DIAGNOSIS — E039 Hypothyroidism, unspecified: Secondary | ICD-10-CM | POA: Diagnosis not present

## 2020-12-12 DIAGNOSIS — F411 Generalized anxiety disorder: Secondary | ICD-10-CM | POA: Diagnosis not present

## 2020-12-12 DIAGNOSIS — F331 Major depressive disorder, recurrent, moderate: Secondary | ICD-10-CM | POA: Diagnosis not present

## 2020-12-12 DIAGNOSIS — F902 Attention-deficit hyperactivity disorder, combined type: Secondary | ICD-10-CM | POA: Diagnosis not present

## 2020-12-12 DIAGNOSIS — M25511 Pain in right shoulder: Secondary | ICD-10-CM | POA: Diagnosis not present

## 2020-12-12 DIAGNOSIS — I10 Essential (primary) hypertension: Secondary | ICD-10-CM | POA: Diagnosis not present

## 2020-12-12 DIAGNOSIS — S2001XD Contusion of right breast, subsequent encounter: Secondary | ICD-10-CM | POA: Diagnosis not present

## 2021-02-14 DIAGNOSIS — G3184 Mild cognitive impairment, so stated: Secondary | ICD-10-CM | POA: Diagnosis not present

## 2021-02-14 DIAGNOSIS — F331 Major depressive disorder, recurrent, moderate: Secondary | ICD-10-CM | POA: Diagnosis not present

## 2021-02-28 DIAGNOSIS — Z23 Encounter for immunization: Secondary | ICD-10-CM | POA: Diagnosis not present

## 2021-05-15 DIAGNOSIS — M1990 Unspecified osteoarthritis, unspecified site: Secondary | ICD-10-CM | POA: Diagnosis not present

## 2021-05-15 DIAGNOSIS — F902 Attention-deficit hyperactivity disorder, combined type: Secondary | ICD-10-CM | POA: Diagnosis not present

## 2021-05-23 DIAGNOSIS — R944 Abnormal results of kidney function studies: Secondary | ICD-10-CM | POA: Diagnosis not present

## 2021-05-23 DIAGNOSIS — E039 Hypothyroidism, unspecified: Secondary | ICD-10-CM | POA: Diagnosis not present

## 2021-05-23 DIAGNOSIS — E782 Mixed hyperlipidemia: Secondary | ICD-10-CM | POA: Diagnosis not present

## 2021-05-23 DIAGNOSIS — E559 Vitamin D deficiency, unspecified: Secondary | ICD-10-CM | POA: Diagnosis not present

## 2021-05-23 DIAGNOSIS — I10 Essential (primary) hypertension: Secondary | ICD-10-CM | POA: Diagnosis not present

## 2021-05-23 DIAGNOSIS — R7301 Impaired fasting glucose: Secondary | ICD-10-CM | POA: Diagnosis not present

## 2021-06-04 DIAGNOSIS — E039 Hypothyroidism, unspecified: Secondary | ICD-10-CM | POA: Diagnosis not present

## 2021-06-04 DIAGNOSIS — M791 Myalgia, unspecified site: Secondary | ICD-10-CM | POA: Diagnosis not present

## 2021-06-04 DIAGNOSIS — F411 Generalized anxiety disorder: Secondary | ICD-10-CM | POA: Diagnosis not present

## 2021-06-04 DIAGNOSIS — F900 Attention-deficit hyperactivity disorder, predominantly inattentive type: Secondary | ICD-10-CM | POA: Diagnosis not present

## 2021-06-04 DIAGNOSIS — I10 Essential (primary) hypertension: Secondary | ICD-10-CM | POA: Diagnosis not present

## 2021-06-04 DIAGNOSIS — R7301 Impaired fasting glucose: Secondary | ICD-10-CM | POA: Diagnosis not present

## 2021-06-04 DIAGNOSIS — R944 Abnormal results of kidney function studies: Secondary | ICD-10-CM | POA: Diagnosis not present

## 2021-06-04 DIAGNOSIS — R809 Proteinuria, unspecified: Secondary | ICD-10-CM | POA: Diagnosis not present

## 2021-06-04 DIAGNOSIS — K219 Gastro-esophageal reflux disease without esophagitis: Secondary | ICD-10-CM | POA: Diagnosis not present

## 2021-06-04 DIAGNOSIS — Z23 Encounter for immunization: Secondary | ICD-10-CM | POA: Diagnosis not present

## 2021-06-14 DIAGNOSIS — F902 Attention-deficit hyperactivity disorder, combined type: Secondary | ICD-10-CM | POA: Diagnosis not present

## 2021-06-14 DIAGNOSIS — M1990 Unspecified osteoarthritis, unspecified site: Secondary | ICD-10-CM | POA: Diagnosis not present

## 2021-08-06 ENCOUNTER — Emergency Department (HOSPITAL_COMMUNITY)
Admission: EM | Admit: 2021-08-06 | Discharge: 2021-08-07 | Disposition: A | Discharge status: Home / Self Care | Payer: Medicare Other | Attending: Emergency Medicine | Admitting: Emergency Medicine

## 2021-08-06 ENCOUNTER — Other Ambulatory Visit: Payer: Self-pay

## 2021-08-06 ENCOUNTER — Encounter (HOSPITAL_COMMUNITY): Payer: Self-pay | Admitting: *Deleted

## 2021-08-06 DIAGNOSIS — Z79899 Other long term (current) drug therapy: Secondary | ICD-10-CM | POA: Diagnosis not present

## 2021-08-06 DIAGNOSIS — J45901 Unspecified asthma with (acute) exacerbation: Secondary | ICD-10-CM | POA: Diagnosis not present

## 2021-08-06 DIAGNOSIS — Z87891 Personal history of nicotine dependence: Secondary | ICD-10-CM | POA: Insufficient documentation

## 2021-08-06 DIAGNOSIS — I10 Essential (primary) hypertension: Secondary | ICD-10-CM | POA: Insufficient documentation

## 2021-08-06 DIAGNOSIS — Z96649 Presence of unspecified artificial hip joint: Secondary | ICD-10-CM | POA: Diagnosis not present

## 2021-08-06 DIAGNOSIS — E039 Hypothyroidism, unspecified: Secondary | ICD-10-CM | POA: Insufficient documentation

## 2021-08-06 LAB — CBC WITH DIFFERENTIAL/PLATELET
Abs Immature Granulocytes: 0.04 10*3/uL (ref 0.00–0.07)
Basophils Absolute: 0.1 10*3/uL (ref 0.0–0.1)
Basophils Relative: 1 %
Eosinophils Absolute: 0.3 10*3/uL (ref 0.0–0.5)
Eosinophils Relative: 3 %
HCT: 42.6 % (ref 36.0–46.0)
Hemoglobin: 13.4 g/dL (ref 12.0–15.0)
Immature Granulocytes: 0 %
Lymphocytes Relative: 28 %
Lymphs Abs: 2.9 10*3/uL (ref 0.7–4.0)
MCH: 28.5 pg (ref 26.0–34.0)
MCHC: 31.5 g/dL (ref 30.0–36.0)
MCV: 90.4 fL (ref 80.0–100.0)
Monocytes Absolute: 0.8 10*3/uL (ref 0.1–1.0)
Monocytes Relative: 8 %
Neutro Abs: 6.1 10*3/uL (ref 1.7–7.7)
Neutrophils Relative %: 60 %
Platelets: 342 10*3/uL (ref 150–400)
RBC: 4.71 MIL/uL (ref 3.87–5.11)
RDW: 14.1 % (ref 11.5–15.5)
WBC: 10.3 10*3/uL (ref 4.0–10.5)
nRBC: 0 % (ref 0.0–0.2)

## 2021-08-06 LAB — BASIC METABOLIC PANEL
Anion gap: 7 (ref 5–15)
BUN: 19 mg/dL (ref 8–23)
CO2: 28 mmol/L (ref 22–32)
Calcium: 9.2 mg/dL (ref 8.9–10.3)
Chloride: 105 mmol/L (ref 98–111)
Creatinine, Ser: 0.95 mg/dL (ref 0.44–1.00)
GFR, Estimated: >60 mL/min (ref >60)
Glucose, Bld: 112 mg/dL — ABNORMAL HIGH (ref 70–99)
Potassium: 3.7 mmol/L (ref 3.5–5.1)
Sodium: 140 mmol/L (ref 135–145)

## 2021-08-06 MED ORDER — LABETALOL HCL 5 MG/ML IV SOLN
10.0000 mg | Freq: Once | INTRAVENOUS | Status: AC
  Administered 2021-08-06: 10 mg via INTRAVENOUS

## 2021-08-06 MED ORDER — AMLODIPINE BESYLATE 5 MG PO TABS
10.0000 mg | ORAL_TABLET | Freq: Once | ORAL | Status: AC
  Administered 2021-08-06: 10 mg via ORAL
  Filled 2021-08-06: qty 2

## 2021-08-06 MED ORDER — LABETALOL HCL 5 MG/ML IV SOLN
10.0000 mg | Freq: Once | INTRAVENOUS | Status: AC
  Administered 2021-08-06: 10 mg via INTRAVENOUS
  Filled 2021-08-06: qty 4

## 2021-08-06 NOTE — ED Provider Notes (Signed)
[facility] EMERGENCY DEPARTMENT Provider Note   CSN: [id number] Arrival date & time: 08/06/21  1627     History Chief Complaint  Patient presents with   Hypertension    [name] is a 80 y.o. female.   Hypertension   Patient presented to the ED for evaluation of high blood pressure.  Patient does have history of hypertension.  She has not been taking her medication regularly over the past month.  Patient states she sometimes forgets to take doses.  Patient was instructed to start checking her blood pressure regularly.  She brought a monitoring device a few days ago.  They have noticed in the last several days that her blood pressure has been elevated.  As high as the 180s and 190s.  In the ER it was even higher.  She is not having chest pain or shortness of breath.  No complaints of headache or confusion.  No vomiting or diarrhea  Past Medical History:  Diagnosis Date   ALLERGIC RHINITIS 11/30/2008   ALLERGIC RHINITIS 11/30/2008   Anxiety    ASTHMA UNSPECIFIED WITH EXACERBATION 02/17/2008   pt. states she does not and has never has asthma   CAROTID BRUIT, LEFT 04/19/2007   Closed fracture of lateral malleolus 06/04/2009   CLOSED FRACTURE OF METATARSAL BONE 06/04/2009   Depression    Esophageal reflux 11/08/2008   GANGLION CYST 04/19/2007   HYPERTENSION 04/07/2007   Hypothyroidism    INSOMNIA 08/07/2010   OSTEOARTHRITIS 04/07/2007   OSTEOPENIA 03/10/2008   PONV (postoperative nausea and vomiting)    UTI 07/20/2009    Patient Active Problem List   Diagnosis Date Noted   S/P reverse total shoulder arthroplasty, left 09/09/2018   Hypothyroidism, postop 11/19/2016   Cyst in hand 08/23/2014   Preventative health care 07/10/2014   Attention deficit disorder 07/10/2014   Obesity (BMI 30-39.9) 12/26/2013   GERD (gastroesophageal reflux disease) 11/17/2012   Depression with anxiety 10/28/2010   INSOMNIA 08/07/2010   ALLERGIC RHINITIS 11/30/2008   OSTEOPENIA 03/10/2008    Hyperlipidemia 04/19/2007   Left carotid bruit 04/19/2007   Essential hypertension 04/07/2007   Osteoarthritis 04/07/2007    Past Surgical History:  Procedure Laterality Date   EYE SURGERY Bilateral    cataract removal   FUNCTIONAL ENDOSCOPIC SINUS SURGERY  01/2018   spinal fluid leaking behind right eye   JOINT REPLACEMENT     knuckle   REVERSE SHOULDER ARTHROPLASTY Left 09/09/2018   REVERSE SHOULDER ARTHROPLASTY Left 09/09/2018   Procedure: REVERSE SHOULDER ARTHROPLASTY;  Surgeon: Justice Britain, MD;  Location: [facility];  Service: Orthopedics;  Laterality: Left;  179min   TOTAL HIP ARTHROPLASTY     TUBAL LIGATION       OB History   No obstetric history on file.     Family History  Problem Relation Age of Onset   Heart disease Mother        Mother also has significant carotid artery stenosis   COPD Father    Alcoholism Father    Cancer Brother    Alzheimer's disease Sister    Thyroid disease Sister        Goiter   Breast cancer Neg Hx     Social History   Tobacco Use   Smoking status: Former    Packs/day: 1.50    Years: 20.00    Pack years: 30.00    Types: Cigarettes    Quit date: 09/15/1968    Years since quitting: 52.9   Smokeless tobacco: Never  Tobacco comments:    smoked 1.5 for  20 years  Vaping Use   Vaping Use: Never used  Substance Use Topics   Alcohol use: No   Drug use: No    Home Medications Prior to Admission medications   Medication Sig Start Date End Date Taking? Authorizing Provider  amphetamine-dextroamphetamine (ADDERALL) 30 MG tablet Take 1 tablet by mouth 2 (two) times daily. Fill in 2 months Patient taking differently: Take 30 mg by mouth 2 (two) times daily.  12/07/17   Marletta Lor, MD  aspirin-acetaminophen-caffeine (EXCEDRIN MIGRAINE) [phone] MG per tablet Take 1 tablet by mouth every 6 (six) hours as needed. Patient taking differently: Take 1 tablet by mouth daily as needed for headache or migraine.  09/02/13   Ricard Dillon, MD  B Complex-Folic Acid (B COMPLEX PLUS) TABS Take 1 tablet by mouth daily.    [provider]  buPROPion (WELLBUTRIN XL) 300 MG 24 hr tablet TAKE 1 TABLET BY MOUTH ONCE DAILY Patient taking differently: Take 300 mg by mouth daily.  03/02/18   Marletta Lor, MD  calcium-vitamin D (OSCAL WITH D) 500-200 MG-UNIT tablet Take 1 tablet by mouth daily with breakfast.    [provider]  Cholecalciferol (VITAMIN D3) 125 MCG (5000 UT) CAPS Take 5,000 Units by mouth daily.    [provider]  diclofenac sodium (VOLTAREN) 1 % GEL APPLY   TOPICALLY TO AFFECTED AREA 4 TIMES DAILY AS NEEDED FOR PAIN Patient taking differently: Apply 1 g topically 4 (four) times daily as needed. FOR PAIN 12/07/17   Marletta Lor, MD  FLUoxetine (PROZAC) 20 MG capsule TAKE ONE CAPSULE BY MOUTH ONCE DAILY Patient taking differently: Take 20 mg by mouth daily.  03/02/18   Marletta Lor, MD  hydrochlorothiazide (MICROZIDE) 12.5 MG capsule Take 12.5 mg by mouth daily. TAKE 1 TABLET BY MOUTH ONCE DAILY.    [provider]  levothyroxine (SYNTHROID, LEVOTHROID) 112 MCG tablet TAKE 1 TABLET BY MOUTH ONCE DAILY BEFORE BREAKFAST Patient taking differently: Take 112 mcg by mouth daily before breakfast.  11/10/17   Elayne Snare, MD  liothyronine (CYTOMEL) 5 MCG tablet TAKE 1 TABLET BY MOUTH ON DAY 1 AND 2 TABLETS BY MOUTH ON DAY 2, ALTERNATING DAYS 07/12/20   Elayne Snare, MD  losartan (COZAAR) 100 MG tablet Take 100 mg by mouth daily. TAKE 1 TABLET BY MOUTH ONCE DAILY.    [provider]  methylPREDNISolone (MEDROL DOSEPAK) 4 MG TBPK tablet Take per box instructions 06/29/20   Sherwood Gambler, MD  Multiple Vitamin (MULTIVITAMIN) capsule Take 1 capsule by mouth daily. Centrum Silver    [provider]  OVER THE COUNTER MEDICATION Take 1 tablet by mouth daily. Joint Support tablet includes:  Primal Celadrine turmeric    [provider]  Polyethyl  Glycol-Propyl Glycol (SYSTANE) 0.4-0.3 % SOLN Place 2 drops into both eyes 2 (two) times daily.    [provider]  promethazine-dextromethorphan (PROMETHAZINE-DM) 6.25-15 MG/5ML syrup Take 5 mLs by mouth 4 (four) times daily as needed for cough. 09/13/20   Faustino Congress, NP    Allergies    Patient has no known allergies.  Review of Systems   Review of Systems  All other systems reviewed and are negative.  Physical Exam Updated Vital Signs BP (!) 198/101 (BP Location: Right Arm)   Pulse 73   Temp 98.5 F (36.9 C) (Oral)   Resp 20   Ht 1.626 m (5\' 4" )   SpO2  99%   BMI 28.76 kg/m   Physical Exam Vitals and nursing note reviewed.  Constitutional:      General: She is not in acute distress.    Appearance: She is well-developed.  HENT:     Head: Normocephalic and atraumatic.     Right Ear: External ear normal.     Left Ear: External ear normal.  Eyes:     General: No scleral icterus.       Right eye: No discharge.        Left eye: No discharge.     Conjunctiva/sclera: Conjunctivae normal.  Neck:     Trachea: No tracheal deviation.  Cardiovascular:     Rate and Rhythm: Normal rate and regular rhythm.  Pulmonary:     Effort: Pulmonary effort is normal. No respiratory distress.     Breath sounds: Normal breath sounds. No stridor. No wheezing or rales.  Abdominal:     General: Bowel sounds are normal. There is no distension.     Palpations: Abdomen is soft.     Tenderness: There is no abdominal tenderness. There is no guarding or rebound.  Musculoskeletal:        General: No tenderness or deformity.     Cervical back: Neck supple.  Skin:    General: Skin is warm and dry.     Findings: No rash.  Neurological:     General: No focal deficit present.     Mental Status: She is alert.     Cranial Nerves: No cranial nerve deficit (no facial droop, extraocular movements intact, no slurred speech).     Sensory: No sensory deficit.     Motor: No abnormal muscle  tone or seizure activity.     Coordination: Coordination normal.  Psychiatric:        Mood and Affect: Mood normal.    ED Results / Procedures / Treatments   Labs (all labs ordered are listed, but only abnormal results are displayed) Labs Reviewed  BASIC METABOLIC PANEL - Abnormal; Notable for the following components:      Result Value   Glucose, Bld 112 (*)    All other components within normal limits  CBC WITH DIFFERENTIAL/PLATELET    EKG None  Radiology No results found.  Procedures Procedures   Medications Ordered in ED Medications  amLODipine (NORVASC) tablet 10 mg (has no administration in time range)    ED Course  I have reviewed the triage vital signs and the nursing notes.  Pertinent labs & imaging results that were available during my care of the patient were reviewed by me and considered in my medical decision making (see chart for details).  Clinical Course as of 08/07/21 0014  Tue Aug 06, 2021  2102 Blood pressure remains elevated despite initial dose of oral medications.  We will give a dose of IV medications [JK]  2211 Blood pressure still elevated but starting to decrease [JK]    Clinical Course User Index [JK] Dorie Rank, MD   MDM Rules/Calculators/A&P                           Patient presented to the ED for ration of high blood pressure.  Patient was asymptomatic without chest pain.  No neurologic complaints.  Laboratory tests were unremarkable.  Patient was given doses of Norvasc and ultimately a labetalol.  Her blood pressure improved.  She is feeling much better now.  No signs of stroke or hypertensive emergency.  We will make sure she takes her blood pressure regularly at home.  I have added Norvasc.  Recommend close monitoring of blood pressure and outpatient follow-up with her primary doctor. Final Clinical Impression(s) / ED Diagnoses Final diagnoses:  None    Rx / DC Orders ED Discharge Orders     None        Dorie Rank,  MD 08/07/21 [id number]

## 2021-08-06 NOTE — ED Triage Notes (Signed)
Pt with elevated BP's at home, sent here by Dr. Nevada Crane. Pt has been prescribed BP medication but not taking as prescribed.

## 2021-08-06 NOTE — ED Notes (Signed)
Pt states she took her bp medication today but hasn't had it in approx 3 weeks prior to today.

## 2021-08-07 MED ORDER — AMLODIPINE BESYLATE 10 MG PO TABS: 10.0000 mg | ORAL_TABLET | Freq: Every day | ORAL | 0 refills | Status: DC

## 2021-08-07 NOTE — Discharge Instructions (Signed)
Continue your blood pressure medications.  Make sure to take it regularly.  Start taking the Norvasc.  Follow-up with your doctor to have your blood pressure rechecked

## 2021-11-04 DIAGNOSIS — F32A Depression, unspecified: Secondary | ICD-10-CM | POA: Diagnosis not present

## 2021-11-04 DIAGNOSIS — R4189 Other symptoms and signs involving cognitive functions and awareness: Secondary | ICD-10-CM | POA: Diagnosis not present

## 2021-11-04 DIAGNOSIS — I10 Essential (primary) hypertension: Secondary | ICD-10-CM | POA: Diagnosis not present

## 2021-11-04 DIAGNOSIS — R5383 Other fatigue: Secondary | ICD-10-CM | POA: Diagnosis not present

## 2022-02-01 ENCOUNTER — Other Ambulatory Visit: Payer: Self-pay

## 2022-02-01 ENCOUNTER — Encounter (HOSPITAL_COMMUNITY): Payer: Self-pay | Admitting: Emergency Medicine

## 2022-02-01 ENCOUNTER — Emergency Department (HOSPITAL_COMMUNITY): Payer: Medicare Other

## 2022-02-01 ENCOUNTER — Inpatient Hospital Stay (HOSPITAL_COMMUNITY)
Admission: EM | Admit: 2022-02-01 | Discharge: 2022-02-04 | DRG: 482 | Disposition: A | Discharge status: Rehabilitation Facility | Payer: Medicare Other | Attending: Internal Medicine | Admitting: Internal Medicine

## 2022-02-01 DIAGNOSIS — Z79899 Other long term (current) drug therapy: Secondary | ICD-10-CM

## 2022-02-01 DIAGNOSIS — E039 Hypothyroidism, unspecified: Secondary | ICD-10-CM | POA: Diagnosis not present

## 2022-02-01 DIAGNOSIS — Z96612 Presence of left artificial shoulder joint: Secondary | ICD-10-CM | POA: Diagnosis not present

## 2022-02-01 DIAGNOSIS — S7290XA Unspecified fracture of unspecified femur, initial encounter for closed fracture: Secondary | ICD-10-CM | POA: Diagnosis present

## 2022-02-01 DIAGNOSIS — Z825 Family history of asthma and other chronic lower respiratory diseases: Secondary | ICD-10-CM | POA: Diagnosis not present

## 2022-02-01 DIAGNOSIS — M25461 Effusion, right knee: Secondary | ICD-10-CM | POA: Diagnosis present

## 2022-02-01 DIAGNOSIS — E890 Postprocedural hypothyroidism: Secondary | ICD-10-CM | POA: Diagnosis present

## 2022-02-01 DIAGNOSIS — S7001XA Contusion of right hip, initial encounter: Secondary | ICD-10-CM | POA: Diagnosis not present

## 2022-02-01 DIAGNOSIS — M8580 Other specified disorders of bone density and structure, unspecified site: Secondary | ICD-10-CM | POA: Diagnosis present

## 2022-02-01 DIAGNOSIS — W108XXA Fall (on) (from) other stairs and steps, initial encounter: Secondary | ICD-10-CM | POA: Diagnosis present

## 2022-02-01 DIAGNOSIS — K219 Gastro-esophageal reflux disease without esophagitis: Secondary | ICD-10-CM | POA: Diagnosis present

## 2022-02-01 DIAGNOSIS — S46011A Strain of muscle(s) and tendon(s) of the rotator cuff of right shoulder, initial encounter: Secondary | ICD-10-CM | POA: Diagnosis present

## 2022-02-01 DIAGNOSIS — Z6828 Body mass index (BMI) 28.0-28.9, adult: Secondary | ICD-10-CM

## 2022-02-01 DIAGNOSIS — Z87891 Personal history of nicotine dependence: Secondary | ICD-10-CM | POA: Diagnosis not present

## 2022-02-01 DIAGNOSIS — Z8781 Personal history of (healed) traumatic fracture: Principal | ICD-10-CM

## 2022-02-01 DIAGNOSIS — S72011S Unspecified intracapsular fracture of right femur, sequela: Secondary | ICD-10-CM | POA: Diagnosis not present

## 2022-02-01 DIAGNOSIS — R4181 Age-related cognitive decline: Secondary | ICD-10-CM

## 2022-02-01 DIAGNOSIS — Y92015 Private garage of single-family (private) house as the place of occurrence of the external cause: Secondary | ICD-10-CM | POA: Diagnosis not present

## 2022-02-01 DIAGNOSIS — J45909 Unspecified asthma, uncomplicated: Secondary | ICD-10-CM | POA: Diagnosis not present

## 2022-02-01 DIAGNOSIS — Z79890 Hormone replacement therapy (postmenopausal): Secondary | ICD-10-CM

## 2022-02-01 DIAGNOSIS — I10 Essential (primary) hypertension: Secondary | ICD-10-CM | POA: Diagnosis present

## 2022-02-01 DIAGNOSIS — J40 Bronchitis, not specified as acute or chronic: Secondary | ICD-10-CM | POA: Diagnosis present

## 2022-02-01 DIAGNOSIS — E669 Obesity, unspecified: Secondary | ICD-10-CM | POA: Diagnosis present

## 2022-02-01 DIAGNOSIS — F32A Depression, unspecified: Secondary | ICD-10-CM | POA: Diagnosis present

## 2022-02-01 DIAGNOSIS — T07XXXA Unspecified multiple injuries, initial encounter: Secondary | ICD-10-CM

## 2022-02-01 DIAGNOSIS — Z811 Family history of alcohol abuse and dependence: Secondary | ICD-10-CM | POA: Diagnosis not present

## 2022-02-01 DIAGNOSIS — R8290 Unspecified abnormal findings in urine: Secondary | ICD-10-CM | POA: Diagnosis not present

## 2022-02-01 DIAGNOSIS — R52 Pain, unspecified: Secondary | ICD-10-CM | POA: Diagnosis not present

## 2022-02-01 DIAGNOSIS — G8918 Other acute postprocedural pain: Secondary | ICD-10-CM | POA: Diagnosis not present

## 2022-02-01 DIAGNOSIS — T148XXA Other injury of unspecified body region, initial encounter: Secondary | ICD-10-CM | POA: Diagnosis not present

## 2022-02-01 DIAGNOSIS — Z6372 Alcoholism and drug addiction in family: Secondary | ICD-10-CM | POA: Diagnosis not present

## 2022-02-01 DIAGNOSIS — M1990 Unspecified osteoarthritis, unspecified site: Secondary | ICD-10-CM | POA: Diagnosis not present

## 2022-02-01 DIAGNOSIS — W010XXD Fall on same level from slipping, tripping and stumbling without subsequent striking against object, subsequent encounter: Secondary | ICD-10-CM | POA: Diagnosis present

## 2022-02-01 DIAGNOSIS — Z66 Do not resuscitate: Secondary | ICD-10-CM | POA: Diagnosis not present

## 2022-02-01 DIAGNOSIS — D72829 Elevated white blood cell count, unspecified: Secondary | ICD-10-CM | POA: Diagnosis present

## 2022-02-01 DIAGNOSIS — Z820 Family history of epilepsy and other diseases of the nervous system: Secondary | ICD-10-CM

## 2022-02-01 DIAGNOSIS — K5900 Constipation, unspecified: Secondary | ICD-10-CM | POA: Diagnosis not present

## 2022-02-01 DIAGNOSIS — Z7189 Other specified counseling: Secondary | ICD-10-CM | POA: Diagnosis not present

## 2022-02-01 DIAGNOSIS — E785 Hyperlipidemia, unspecified: Secondary | ICD-10-CM | POA: Diagnosis present

## 2022-02-01 DIAGNOSIS — M25521 Pain in right elbow: Secondary | ICD-10-CM | POA: Diagnosis not present

## 2022-02-01 DIAGNOSIS — S72011A Unspecified intracapsular fracture of right femur, initial encounter for closed fracture: Secondary | ICD-10-CM | POA: Diagnosis not present

## 2022-02-01 DIAGNOSIS — M25551 Pain in right hip: Secondary | ICD-10-CM | POA: Diagnosis not present

## 2022-02-01 DIAGNOSIS — S46011D Strain of muscle(s) and tendon(s) of the rotator cuff of right shoulder, subsequent encounter: Secondary | ICD-10-CM | POA: Diagnosis not present

## 2022-02-01 DIAGNOSIS — R062 Wheezing: Secondary | ICD-10-CM | POA: Diagnosis not present

## 2022-02-01 DIAGNOSIS — Z96649 Presence of unspecified artificial hip joint: Secondary | ICD-10-CM | POA: Diagnosis not present

## 2022-02-01 DIAGNOSIS — F419 Anxiety disorder, unspecified: Secondary | ICD-10-CM | POA: Diagnosis not present

## 2022-02-01 DIAGNOSIS — M7989 Other specified soft tissue disorders: Secondary | ICD-10-CM | POA: Diagnosis not present

## 2022-02-01 DIAGNOSIS — Z809 Family history of malignant neoplasm, unspecified: Secondary | ICD-10-CM | POA: Diagnosis not present

## 2022-02-01 DIAGNOSIS — Z8249 Family history of ischemic heart disease and other diseases of the circulatory system: Secondary | ICD-10-CM | POA: Diagnosis not present

## 2022-02-01 DIAGNOSIS — E7800 Pure hypercholesterolemia, unspecified: Secondary | ICD-10-CM

## 2022-02-01 DIAGNOSIS — S728X1A Other fracture of right femur, initial encounter for closed fracture: Secondary | ICD-10-CM

## 2022-02-01 DIAGNOSIS — S72001A Fracture of unspecified part of neck of right femur, initial encounter for closed fracture: Secondary | ICD-10-CM | POA: Diagnosis not present

## 2022-02-01 DIAGNOSIS — S728X1D Other fracture of right femur, subsequent encounter for closed fracture with routine healing: Secondary | ICD-10-CM | POA: Diagnosis not present

## 2022-02-01 DIAGNOSIS — M19011 Primary osteoarthritis, right shoulder: Secondary | ICD-10-CM | POA: Diagnosis present

## 2022-02-01 DIAGNOSIS — M1712 Unilateral primary osteoarthritis, left knee: Secondary | ICD-10-CM | POA: Diagnosis not present

## 2022-02-01 DIAGNOSIS — Z8349 Family history of other endocrine, nutritional and metabolic diseases: Secondary | ICD-10-CM

## 2022-02-01 DIAGNOSIS — R630 Anorexia: Secondary | ICD-10-CM | POA: Diagnosis not present

## 2022-02-01 DIAGNOSIS — G4700 Insomnia, unspecified: Secondary | ICD-10-CM | POA: Diagnosis not present

## 2022-02-01 DIAGNOSIS — S72011D Unspecified intracapsular fracture of right femur, subsequent encounter for closed fracture with routine healing: Secondary | ICD-10-CM | POA: Diagnosis not present

## 2022-02-01 DIAGNOSIS — Z96642 Presence of left artificial hip joint: Secondary | ICD-10-CM | POA: Diagnosis present

## 2022-02-01 DIAGNOSIS — M1611 Unilateral primary osteoarthritis, right hip: Secondary | ICD-10-CM | POA: Diagnosis not present

## 2022-02-01 DIAGNOSIS — F320 Major depressive disorder, single episode, mild: Secondary | ICD-10-CM | POA: Diagnosis not present

## 2022-02-01 LAB — CBC WITH DIFFERENTIAL/PLATELET
Abs Immature Granulocytes: 0.1 10*3/uL — ABNORMAL HIGH (ref 0.00–0.07)
Basophils Absolute: 0.1 10*3/uL (ref 0.0–0.1)
Basophils Relative: 1 %
Eosinophils Absolute: 0.3 10*3/uL (ref 0.0–0.5)
Eosinophils Relative: 2 %
HCT: 44 % (ref 36.0–46.0)
Hemoglobin: 13.8 g/dL (ref 12.0–15.0)
Immature Granulocytes: 1 %
Lymphocytes Relative: 22 %
Lymphs Abs: 3.1 10*3/uL (ref 0.7–4.0)
MCH: 27.9 pg (ref 26.0–34.0)
MCHC: 31.4 g/dL (ref 30.0–36.0)
MCV: 88.9 fL (ref 80.0–100.0)
Monocytes Absolute: 0.8 10*3/uL (ref 0.1–1.0)
Monocytes Relative: 6 %
Neutro Abs: 10 10*3/uL — ABNORMAL HIGH (ref 1.7–7.7)
Neutrophils Relative %: 68 %
Platelets: 383 10*3/uL (ref 150–400)
RBC: 4.95 MIL/uL (ref 3.87–5.11)
RDW: 15.7 % — ABNORMAL HIGH (ref 11.5–15.5)
WBC: 14.4 10*3/uL — ABNORMAL HIGH (ref 4.0–10.5)
nRBC: 0 % (ref 0.0–0.2)

## 2022-02-01 LAB — BASIC METABOLIC PANEL
Anion gap: 8 (ref 5–15)
BUN: 18 mg/dL (ref 8–23)
CO2: 27 mmol/L (ref 22–32)
Calcium: 9.6 mg/dL (ref 8.9–10.3)
Chloride: 105 mmol/L (ref 98–111)
Creatinine, Ser: 0.98 mg/dL (ref 0.44–1.00)
GFR, Estimated: 58 mL/min — ABNORMAL LOW (ref >60)
Glucose, Bld: 98 mg/dL (ref 70–99)
Potassium: 3.6 mmol/L (ref 3.5–5.1)
Sodium: 140 mmol/L (ref 135–145)

## 2022-02-01 LAB — PROTIME-INR
INR: 1 (ref 0.8–1.2)
Prothrombin Time: 12.6 seconds (ref 11.4–15.2)

## 2022-02-01 MED ORDER — MORPHINE SULFATE (PF) 2 MG/ML IV SOLN
2.0000 mg | INTRAVENOUS | Status: DC | PRN
  Administered 2022-02-01: 2 mg via INTRAVENOUS
  Filled 2022-02-01: qty 1

## 2022-02-01 MED ORDER — BUPROPION HCL ER (XL) 150 MG PO TB24
300.0000 mg | ORAL_TABLET | Freq: Every day | ORAL | Status: DC
  Administered 2022-02-03 – 2022-02-04 (×2): 300 mg via ORAL
  Filled 2022-02-01 (×2): qty 2
  Filled 2022-02-01: qty 1

## 2022-02-01 MED ORDER — LOSARTAN POTASSIUM 50 MG PO TABS
100.0000 mg | ORAL_TABLET | Freq: Every day | ORAL | Status: DC
  Administered 2022-02-03 – 2022-02-04 (×2): 100 mg via ORAL
  Filled 2022-02-01 (×2): qty 2

## 2022-02-01 MED ORDER — ONDANSETRON HCL 4 MG PO TABS: 4.0000 mg | ORAL_TABLET | Freq: Four times a day (QID) | ORAL | Status: DC | PRN

## 2022-02-01 MED ORDER — CHLORHEXIDINE GLUCONATE 4 % EX LIQD
60.0000 mL | Freq: Once | CUTANEOUS | Status: DC
  Filled 2022-02-01: qty 60

## 2022-02-01 MED ORDER — POLYVINYL ALCOHOL 1.4 % OP SOLN
2.0000 [drp] | Freq: Two times a day (BID) | OPHTHALMIC | Status: DC
  Administered 2022-02-01 – 2022-02-04 (×4): 2 [drp] via OPHTHALMIC
  Filled 2022-02-01 (×2): qty 15

## 2022-02-01 MED ORDER — SODIUM CHLORIDE 0.9 % IV SOLN
INTRAVENOUS | Status: DC
  Administered 2022-02-01: 1000 mL via INTRAVENOUS

## 2022-02-01 MED ORDER — TRANEXAMIC ACID-NACL 1000-0.7 MG/100ML-% IV SOLN
1000.0000 mg | INTRAVENOUS | Status: AC
  Administered 2022-02-02: 1000 mg via INTRAVENOUS
  Filled 2022-02-01: qty 100

## 2022-02-01 MED ORDER — LIOTHYRONINE SODIUM 5 MCG PO TABS: 5.0000 ug | ORAL_TABLET | Freq: Every day | ORAL | Status: DC

## 2022-02-01 MED ORDER — LEVOTHYROXINE SODIUM 50 MCG PO TABS: 50.0000 ug | ORAL_TABLET | Freq: Every day | ORAL | Status: DC

## 2022-02-01 MED ORDER — CEFAZOLIN SODIUM-DEXTROSE 2-4 GM/100ML-% IV SOLN
2.0000 g | INTRAVENOUS | Status: AC
  Administered 2022-02-02: 2 g via INTRAVENOUS
  Filled 2022-02-01: qty 100

## 2022-02-01 MED ORDER — LEVOTHYROXINE SODIUM 50 MCG PO TABS
50.0000 ug | ORAL_TABLET | Freq: Every day | ORAL | Status: DC
  Administered 2022-02-03 – 2022-02-04 (×2): 50 ug via ORAL
  Filled 2022-02-01 (×2): qty 1

## 2022-02-01 MED ORDER — SENNA 8.6 MG PO TABS
1.0000 | ORAL_TABLET | Freq: Two times a day (BID) | ORAL | Status: DC
  Administered 2022-02-01 – 2022-02-04 (×5): 8.6 mg via ORAL
  Filled 2022-02-01 (×5): qty 1

## 2022-02-01 MED ORDER — AMLODIPINE BESYLATE 10 MG PO TABS
10.0000 mg | ORAL_TABLET | Freq: Every day | ORAL | Status: DC
  Administered 2022-02-03 – 2022-02-04 (×2): 10 mg via ORAL
  Filled 2022-02-01 (×2): qty 1

## 2022-02-01 MED ORDER — POVIDONE-IODINE 10 % EX SWAB
2.0000 "application " | Freq: Once | CUTANEOUS | Status: AC
  Administered 2022-02-02: 2 via TOPICAL

## 2022-02-01 MED ORDER — ONDANSETRON HCL 4 MG/2ML IJ SOLN
4.0000 mg | Freq: Four times a day (QID) | INTRAMUSCULAR | Status: DC | PRN
Start: 2022-02-01 — End: 2022-02-04
  Administered 2022-02-01 – 2022-02-02 (×3): 4 mg via INTRAVENOUS
  Filled 2022-02-01 (×3): qty 2

## 2022-02-01 MED ORDER — AMPHETAMINE-DEXTROAMPHETAMINE 10 MG PO TABS
30.0000 mg | ORAL_TABLET | Freq: Two times a day (BID) | ORAL | Status: DC
  Administered 2022-02-03 – 2022-02-04 (×4): 30 mg via ORAL
  Filled 2022-02-01 (×4): qty 3

## 2022-02-01 MED ORDER — MORPHINE SULFATE (PF) 4 MG/ML IV SOLN
4.0000 mg | INTRAVENOUS | Status: DC | PRN
Start: 2022-02-01 — End: 2022-02-04
  Administered 2022-02-01 – 2022-02-03 (×4): 4 mg via INTRAVENOUS
  Filled 2022-02-01 (×4): qty 1

## 2022-02-01 NOTE — H&P (Signed)
History and Physical    Patient: [name] FIE:[id number] DOB: 02-18-1941 DOA: 02/01/2022 DOS: the patient was seen and examined on 02/01/2022 PCP: Celene Squibb, MD  Patient coming from: Home  Chief Complaint:  Chief Complaint  Patient presents with   Fall   HPI: [name] is a 81 y.o. female with medical history significant of hypertension, hypothyroidism, osteopenia, anxiety/depression, GERD.  Patient seen after a mechanical fall earlier today.  The patient who stepping into garage and missed stepped, falling on her right side.  She had immediate pain and difficulty walking.  She was brought to the hospital by EMS.  X-rays show a right subcapital femoral fracture.  Review of Systems: As mentioned in the history of present illness. All other systems reviewed and are negative. Past Medical History:  Diagnosis Date   ALLERGIC RHINITIS 11/30/2008   ALLERGIC RHINITIS 11/30/2008   Anxiety    ASTHMA UNSPECIFIED WITH EXACERBATION 02/17/2008   pt. states she does not and has never has asthma   CAROTID BRUIT, LEFT 04/19/2007   Closed fracture of lateral malleolus 06/04/2009   CLOSED FRACTURE OF METATARSAL BONE 06/04/2009   Depression    Esophageal reflux 11/08/2008   GANGLION CYST 04/19/2007   HYPERTENSION 04/07/2007   Hypothyroidism    INSOMNIA 08/07/2010   OSTEOARTHRITIS 04/07/2007   OSTEOPENIA 03/10/2008   PONV (postoperative nausea and vomiting)    UTI 07/20/2009   Past Surgical History:  Procedure Laterality Date   EYE SURGERY Bilateral    cataract removal   FUNCTIONAL ENDOSCOPIC SINUS SURGERY  01/2018   spinal fluid leaking behind right eye   JOINT REPLACEMENT     knuckle   REVERSE SHOULDER ARTHROPLASTY Left 09/09/2018   REVERSE SHOULDER ARTHROPLASTY Left 09/09/2018   Procedure: REVERSE SHOULDER ARTHROPLASTY;  Surgeon: Justice Britain, MD;  Location: [facility];  Service: Orthopedics;  Laterality: Left;  198mn   TOTAL HIP ARTHROPLASTY     TUBAL LIGATION     Social History:   reports that she quit smoking about 53 years ago. Her smoking use included cigarettes. She has a 30.00 pack-year smoking history. She has never used smokeless tobacco. She reports that she does not drink alcohol and does not use drugs.  No Known Allergies  Family History  Problem Relation Age of Onset   Heart disease Mother        Mother also has significant carotid artery stenosis   COPD Father    Alcoholism Father    Cancer Brother    Alzheimer's disease Sister    Thyroid disease Sister        Goiter   Breast cancer Neg Hx     Prior to Admission medications   Medication Sig Start Date End Date Taking? Authorizing Provider  amLODipine (NORVASC) 10 MG tablet Take 1 tablet (10 mg total) by mouth daily. 08/07/21   KDorie Rank MD  amphetamine-dextroamphetamine (ADDERALL) 30 MG tablet Take 1 tablet by mouth 2 (two) times daily. Fill in 2 months Patient taking differently: Take 30 mg by mouth 2 (two) times daily.  12/07/17   KMarletta Lor MD  aspirin-acetaminophen-caffeine (EXCEDRIN MIGRAINE) [id number] per tablet Take 1 tablet by mouth every 6 (six) hours as needed. Patient taking differently: Take 1 tablet by mouth daily as needed for headache or migraine.  09/02/13   JRicard Dillon MD  B Complex-Folic Acid (B COMPLEX PLUS) TABS Take 1 tablet by mouth daily.    [provider]  buPROPion (WELLBUTRIN XL)  300 MG 24 hr tablet TAKE 1 TABLET BY MOUTH ONCE DAILY Patient taking differently: Take 300 mg by mouth daily.  03/02/18   Marletta Lor, MD  calcium-vitamin D (OSCAL WITH D) 500-200 MG-UNIT tablet Take 1 tablet by mouth daily with breakfast.    [provider]  Cholecalciferol (VITAMIN D3) 125 MCG (5000 UT) CAPS Take 5,000 Units by mouth daily.    [provider]  diclofenac sodium (VOLTAREN) 1 % GEL APPLY   TOPICALLY TO AFFECTED AREA 4 TIMES DAILY AS NEEDED FOR PAIN Patient taking differently: Apply 1 g topically 4 (four) times daily as needed.  FOR PAIN 12/07/17   Marletta Lor, MD  FLUoxetine (PROZAC) 20 MG capsule TAKE ONE CAPSULE BY MOUTH ONCE DAILY Patient taking differently: Take 20 mg by mouth daily.  03/02/18   Marletta Lor, MD  hydrochlorothiazide (MICROZIDE) 12.5 MG capsule Take 12.5 mg by mouth daily. TAKE 1 TABLET BY MOUTH ONCE DAILY.    [provider]  levothyroxine (SYNTHROID, LEVOTHROID) 112 MCG tablet TAKE 1 TABLET BY MOUTH ONCE DAILY BEFORE BREAKFAST Patient taking differently: Take 112 mcg by mouth daily before breakfast.  11/10/17   Elayne Snare, MD  liothyronine (CYTOMEL) 5 MCG tablet TAKE 1 TABLET BY MOUTH ON DAY 1 AND 2 TABLETS BY MOUTH ON DAY 2, ALTERNATING DAYS 07/12/20   Elayne Snare, MD  losartan (COZAAR) 100 MG tablet Take 100 mg by mouth daily. TAKE 1 TABLET BY MOUTH ONCE DAILY.    [provider]  methylPREDNISolone (MEDROL DOSEPAK) 4 MG TBPK tablet Take per box instructions 06/29/20   Sherwood Gambler, MD  Multiple Vitamin (MULTIVITAMIN) capsule Take 1 capsule by mouth daily. Centrum Silver    [provider]  OVER THE COUNTER MEDICATION Take 1 tablet by mouth daily. Joint Support tablet includes:  Primal Celadrine turmeric    [provider]  Polyethyl Glycol-Propyl Glycol (SYSTANE) 0.4-0.3 % SOLN Place 2 drops into both eyes 2 (two) times daily.    [provider]  promethazine-dextromethorphan (PROMETHAZINE-DM) 6.25-15 MG/5ML syrup Take 5 mLs by mouth 4 (four) times daily as needed for cough. 09/13/20   Faustino Congress, NP    Physical Exam: Vitals:   02/01/22 1512 02/01/22 1513  BP:  (!) 175/85  Pulse:  78  Resp:  18  Temp:  98.1 F (36.7 C)  TempSrc:  Oral  SpO2:  95%  Weight: 75.8 kg   Height: 5' 4.5" (1.638 m)    General: Elderly female. Awake and alert and oriented x3. No acute cardiopulmonary distress.  HEENT: Normocephalic atraumatic.  Right and left ears normal in appearance.  Pupils equal, round, reactive to light. Extraocular  muscles are intact. Sclerae anicteric and noninjected.  Moist mucosal membranes. No mucosal lesions.  Neck: Neck supple without lymphadenopathy. No carotid bruits. No masses palpated.  Cardiovascular: Regular rate with normal S1-S2 sounds. No murmurs, rubs, gallops auscultated. No JVD.  Respiratory: Good respiratory effort with no wheezes, rales, rhonchi. Lungs clear to auscultation bilaterally.  No accessory muscle use. Abdomen: Soft, nontender, nondistended. Active bowel sounds. No masses or hepatosplenomegaly  Skin: No rashes, lesions, or ulcerations.  Dry, warm to touch. 2+ dorsalis pedis and radial pulses. Musculoskeletal: Right leg shortened and externally rotated.  No contractures  Psychiatric: Intact judgment and insight. Pleasant and cooperative. Neurologic: No focal neurological deficits. Strength is 5/5 and symmetric in upper and lower extremities.  Cranial nerves II through XII are grossly intact.  Data Reviewed: Results for orders placed  or performed during the hospital encounter of 02/01/22 (from the past 24 hour(s))  Basic metabolic panel     Status: Abnormal   Collection Time: 02/01/22  5:33 PM  Result Value Ref Range   Sodium 140 135 - 145 mmol/L   Potassium 3.6 3.5 - 5.1 mmol/L   Chloride 105 98 - 111 mmol/L   CO2 27 22 - 32 mmol/L   Glucose, Bld 98 70 - 99 mg/dL   BUN 18 8 - 23 mg/dL   Creatinine, Ser 0.98 0.44 - 1.00 mg/dL   Calcium 9.6 8.9 - 10.3 mg/dL   GFR, Estimated 58 (L) >60 mL/min   Anion gap 8 5 - 15  CBC with Differential     Status: Abnormal   Collection Time: 02/01/22  5:33 PM  Result Value Ref Range   WBC 14.4 (H) 4.0 - 10.5 K/uL   RBC 4.95 3.87 - 5.11 MIL/uL   Hemoglobin 13.8 12.0 - 15.0 g/dL   HCT 44.0 36.0 - 46.0 %   MCV 88.9 80.0 - 100.0 fL   MCH 27.9 26.0 - 34.0 pg   MCHC 31.4 30.0 - 36.0 g/dL   RDW 15.7 (H) 11.5 - 15.5 %   Platelets 383 150 - 400 K/uL   nRBC 0.0 0.0 - 0.2 %   Neutrophils Relative % 68 %   Neutro Abs 10.0 (H) 1.7 - 7.7 K/uL    Lymphocytes Relative 22 %   Lymphs Abs 3.1 0.7 - 4.0 K/uL   Monocytes Relative 6 %   Monocytes Absolute 0.8 0.1 - 1.0 K/uL   Eosinophils Relative 2 %   Eosinophils Absolute 0.3 0.0 - 0.5 K/uL   Basophils Relative 1 %   Basophils Absolute 0.1 0.0 - 0.1 K/uL   Immature Granulocytes 1 %   Abs Immature Granulocytes 0.10 (H) 0.00 - 0.07 K/uL  Protime-INR     Status: None   Collection Time: 02/01/22  5:33 PM  Result Value Ref Range   Prothrombin Time 12.6 11.4 - 15.2 seconds   INR 1.0 0.8 - 1.2   DG Shoulder Right  Result Date: 02/01/2022 CLINICAL DATA:  Pain after fall EXAM: RIGHT SHOULDER - 2+ VIEW COMPARISON:  Chest x-ray September 13, 2020 FINDINGS: The right humerus is high riding consistent with rotator cuff pathology/tear. Irregularity of the radial head is favored to be chronic. No convincing evidence of acute fracture. Glenohumeral degenerative changes are identified. AC joint degenerative changes are identified. No other acute abnormalities. No dislocation. IMPRESSION: 1. High riding right humerus consistent with rotator cuff pathology/tear. 2. Irregularity and flattening of the humeral head is favored to be nonacute. No convincing evidence of acute fracture. No dislocation. 3. Severe glenohumeral degenerative changes with significant loss of joint space. Electronically Signed   By: Dorise Bullion III M.D.   On: 02/01/2022 16:31   DG Elbow Complete Right  Result Date: 02/01/2022 CLINICAL DATA:  Pain after fall EXAM: RIGHT ELBOW - COMPLETE 3+ VIEW COMPARISON:  None Available. FINDINGS: There is no evidence of fracture, dislocation, or joint effusion. There is no evidence of arthropathy or other focal bone abnormality. Soft tissues are unremarkable. IMPRESSION: Negative. Electronically Signed   By: Dorise Bullion III M.D.   On: 02/01/2022 16:34   DG Hip Unilat With Pelvis 2-3 Views Right  Result Date: 02/01/2022 CLINICAL DATA:  Pain after fall EXAM: DG HIP (WITH OR WITHOUT PELVIS)  2-3V RIGHT COMPARISON:  None Available. FINDINGS: The patient is status post left hip replacement. Visualized  portions of the left hip hardware are in good position. The pelvic bones are intact. There is a mildly impacted subcapital fracture in the right hip. No dislocation. No other acute abnormalities. IMPRESSION: Mildly impacted subcapital right hip fracture. Electronically Signed   By: Dorise Bullion III M.D.   On: 02/01/2022 16:32     Assessment and Plan: No notes have been filed under this hospital service. Service: Hospitalist  Principal Problem:   Femoral fracture ([place]) Active Problems:   Hyperlipidemia   Essential hypertension   GERD (gastroesophageal reflux disease)   Obesity (BMI 30-39.9)   Hypothyroidism, postop  Patient discussed with the EDP including history, presentation, ER course.  Femoral fracture Pain control N.p.o. after midnight with expectation of doing surgery tomorrow No active acute cardiovascular disease Hypertension Continue antihypertensives Hypothyroidism Continue Synthroid GERD Hyperlipidemia We will continue statin   Advance Care Planning:   Code Status: Prior full code  Consults: Ortho  Family Communication: Husband present during interview and exam  Severity of Illness: The appropriate patient status for this patient is INPATIENT. Inpatient status is judged to be reasonable and necessary in order to provide the required intensity of service to ensure the patient's safety. The patient's presenting symptoms, physical exam findings, and initial radiographic and laboratory data in the context of their chronic comorbidities is felt to place them at high risk for further clinical deterioration. Furthermore, it is not anticipated that the patient will be medically stable for discharge from the hospital within 2 midnights of admission.   * I certify that at the point of admission it is my clinical judgment that the patient will require inpatient  hospital care spanning beyond 2 midnights from the point of admission due to high intensity of service, high risk for further deterioration and high frequency of surveillance required.*  Author: Truett Mainland, DO 02/01/2022 6:24 PM  For on call review [email or web address].

## 2022-02-01 NOTE — ED Provider Notes (Signed)
[facility] EMERGENCY DEPARTMENT Provider Note   CSN: [id number] Arrival date & time: 02/01/22  1446     History  Chief Complaint  Patient presents with   [name] is a 81 y.o. female.  HPI Patient fell as she was coming out of the doorway at her home.  She was unable to get up without assistance, and when she did stand up she was unable to bear weight on the right leg secondary to pain in the right hip.  She also injured her right upper arm when she fell.  She states she hit her head but it does not hurt.  She denies neck or back pain.  She does not take anticoagulants.  She is here with her husband who brought her here by private vehicle.  He states that he supported her so she would not have to bear weight on the right leg.    Home Medications Prior to Admission medications   Medication Sig Start Date End Date Taking? Authorizing Provider  amLODipine (NORVASC) 10 MG tablet Take 1 tablet (10 mg total) by mouth daily. 08/07/21   Dorie Rank, MD  amphetamine-dextroamphetamine (ADDERALL) 30 MG tablet Take 1 tablet by mouth 2 (two) times daily. Fill in 2 months Patient taking differently: Take 30 mg by mouth 2 (two) times daily.  12/07/17   Marletta Lor, MD  aspirin-acetaminophen-caffeine (EXCEDRIN MIGRAINE) [phone] MG per tablet Take 1 tablet by mouth every 6 (six) hours as needed. Patient taking differently: Take 1 tablet by mouth daily as needed for headache or migraine.  09/02/13   Ricard Dillon, MD  B Complex-Folic Acid (B COMPLEX PLUS) TABS Take 1 tablet by mouth daily.    [provider]  buPROPion (WELLBUTRIN XL) 300 MG 24 hr tablet TAKE 1 TABLET BY MOUTH ONCE DAILY Patient taking differently: Take 300 mg by mouth daily.  03/02/18   Marletta Lor, MD  calcium-vitamin D (OSCAL WITH D) 500-200 MG-UNIT tablet Take 1 tablet by mouth daily with breakfast.    [provider]  Cholecalciferol (VITAMIN D3) 125 MCG (5000 UT) CAPS Take  5,000 Units by mouth daily.    [provider]  diclofenac sodium (VOLTAREN) 1 % GEL APPLY   TOPICALLY TO AFFECTED AREA 4 TIMES DAILY AS NEEDED FOR PAIN Patient taking differently: Apply 1 g topically 4 (four) times daily as needed. FOR PAIN 12/07/17   Marletta Lor, MD  FLUoxetine (PROZAC) 20 MG capsule TAKE ONE CAPSULE BY MOUTH ONCE DAILY Patient taking differently: Take 20 mg by mouth daily.  03/02/18   Marletta Lor, MD  hydrochlorothiazide (MICROZIDE) 12.5 MG capsule Take 12.5 mg by mouth daily. TAKE 1 TABLET BY MOUTH ONCE DAILY.    [provider]  levothyroxine (SYNTHROID, LEVOTHROID) 112 MCG tablet TAKE 1 TABLET BY MOUTH ONCE DAILY BEFORE BREAKFAST Patient taking differently: Take 112 mcg by mouth daily before breakfast.  11/10/17   Elayne Snare, MD  liothyronine (CYTOMEL) 5 MCG tablet TAKE 1 TABLET BY MOUTH ON DAY 1 AND 2 TABLETS BY MOUTH ON DAY 2, ALTERNATING DAYS 07/12/20   Elayne Snare, MD  losartan (COZAAR) 100 MG tablet Take 100 mg by mouth daily. TAKE 1 TABLET BY MOUTH ONCE DAILY.    [provider]  methylPREDNISolone (MEDROL DOSEPAK) 4 MG TBPK tablet Take per box instructions 06/29/20   Sherwood Gambler, MD  Multiple Vitamin (MULTIVITAMIN) capsule Take 1 capsule by mouth daily. Centrum Silver  [provider]  OVER THE COUNTER MEDICATION Take 1 tablet by mouth daily. Joint Support tablet includes:  Primal Celadrine turmeric    [provider]  Polyethyl Glycol-Propyl Glycol (SYSTANE) 0.4-0.3 % SOLN Place 2 drops into both eyes 2 (two) times daily.    [provider]  promethazine-dextromethorphan (PROMETHAZINE-DM) 6.25-15 MG/5ML syrup Take 5 mLs by mouth 4 (four) times daily as needed for cough. 09/13/20   Faustino Congress, NP      Allergies    Patient has no known allergies.    Review of Systems   Review of Systems  Physical Exam Updated Vital Signs BP (!) 175/85 (BP Location: Left Arm)   Pulse 78    Temp 98.1 F (36.7 C) (Oral)   Resp 18   Ht 5' 4.5" (1.638 m)   Wt 75.8 kg   SpO2 95%   BMI 28.22 kg/m  Physical Exam Vitals and nursing note reviewed.  Constitutional:      General: She is in acute distress (Uncomfortable).     Appearance: She is well-developed. She is not ill-appearing.  HENT:     Head: Normocephalic and atraumatic.     Right Ear: External ear normal.     Left Ear: External ear normal.     Mouth/Throat:     Mouth: Mucous membranes are moist.  Eyes:     Conjunctiva/sclera: Conjunctivae normal.     Pupils: Pupils are equal, round, and reactive to light.  Neck:     Trachea: Phonation normal.  Cardiovascular:     Rate and Rhythm: Normal rate and regular rhythm.     Heart sounds: Normal heart sounds.  Pulmonary:     Effort: Pulmonary effort is normal.     Breath sounds: Normal breath sounds.  Chest:     Chest wall: No tenderness.  Abdominal:     General: There is no distension.     Palpations: Abdomen is soft.     Tenderness: There is no abdominal tenderness.  Musculoskeletal:     Cervical back: Normal range of motion and neck supple.     Comments: Guards against movement of the right hip secondary to pain.  No right leg shortening.  Right elbow tender with passive range of motion.  Right humerus head tender with passive range of motion.  No palpable deformity of the right shoulder or right elbow.  Neurovascular intact distally in the right hand.  Skin:    General: Skin is warm and dry.  Neurological:     Mental Status: She is alert and oriented to person, place, and time.     Cranial Nerves: No cranial nerve deficit.     Sensory: No sensory deficit.     Motor: No abnormal muscle tone.     Coordination: Coordination normal.  Psychiatric:        Mood and Affect: Mood normal.        Behavior: Behavior normal.        Thought Content: Thought content normal.        Judgment: Judgment normal.    ED Results / Procedures / Treatments   Labs (all labs  ordered are listed, but only abnormal results are displayed) Labs Reviewed  BASIC METABOLIC PANEL  CBC WITH DIFFERENTIAL/PLATELET  PROTIME-INR  TYPE AND SCREEN    EKG None  Radiology DG Shoulder Right  Result Date: 02/01/2022 CLINICAL DATA:  Pain after fall EXAM: RIGHT SHOULDER - 2+ VIEW COMPARISON:  Chest x-ray September 13, 2020 FINDINGS: The right  humerus is high riding consistent with rotator cuff pathology/tear. Irregularity of the radial head is favored to be chronic. No convincing evidence of acute fracture. Glenohumeral degenerative changes are identified. AC joint degenerative changes are identified. No other acute abnormalities. No dislocation. IMPRESSION: 1. High riding right humerus consistent with rotator cuff pathology/tear. 2. Irregularity and flattening of the humeral head is favored to be nonacute. No convincing evidence of acute fracture. No dislocation. 3. Severe glenohumeral degenerative changes with significant loss of joint space. Electronically Signed   By: Dorise Bullion III M.D.   On: 02/01/2022 16:31   DG Elbow Complete Right  Result Date: 02/01/2022 CLINICAL DATA:  Pain after fall EXAM: RIGHT ELBOW - COMPLETE 3+ VIEW COMPARISON:  None Available. FINDINGS: There is no evidence of fracture, dislocation, or joint effusion. There is no evidence of arthropathy or other focal bone abnormality. Soft tissues are unremarkable. IMPRESSION: Negative. Electronically Signed   By: Dorise Bullion III M.D.   On: 02/01/2022 16:34   DG Hip Unilat With Pelvis 2-3 Views Right  Result Date: 02/01/2022 CLINICAL DATA:  Pain after fall EXAM: DG HIP (WITH OR WITHOUT PELVIS) 2-3V RIGHT COMPARISON:  None Available. FINDINGS: The patient is status post left hip replacement. Visualized portions of the left hip hardware are in good position. The pelvic bones are intact. There is a mildly impacted subcapital fracture in the right hip. No dislocation. No other acute abnormalities. IMPRESSION:  Mildly impacted subcapital right hip fracture. Electronically Signed   By: Dorise Bullion III M.D.   On: 02/01/2022 16:32    Procedures Procedures    Medications Ordered in ED Medications  0.9 %  sodium chloride infusion (has no administration in time range)    ED Course/ Medical Decision Making/ A&P                           Medical Decision Making Fall without clear mechanism, possibly mechanical as she was moved maneuvering out of a doorway.  She presents nonambulatory by private vehicle for evaluation.  Differential diagnosis includes fractures of long bone femur and humerus, contusions multiple and muscle strain.  Problems Addressed: Contusion, multiple sites: acute illness or injury Osteoarthritis of right shoulder, unspecified osteoarthritis type: chronic illness or injury S/P right hip fracture: acute illness or injury that poses a threat to life or bodily functions  Amount and/or Complexity of Data Reviewed Independent Historian:     Details: She is a cogent historian Labs: ordered.    Details: CBC, metabolic panel, INR- Radiology: ordered and independent interpretation performed.    Details: X-rays; right shoulder, right elbow, pelvis, right hip-normal except right hip with subcapital fracture, degenerative changes right shoulder without dislocation Discussion of management or test interpretation with external provider(s): Case discussed with orthopedics, Dr. Mable Fill who accepts patient for consultation and surgical management.  He requested the patient be transferred to [facility].  Consultation hospitalist arrange for admission.  Risk Prescription drug management. Decision regarding hospitalization. Elective major surgery with no identified risk factors. Risk Details: Patient with mechanical fall leading to right hip fracture.  Prior left hip fracture requiring replacement.  No other serious injury, or fracture.  No indication of injury to head or spine.  Patient  requires hospitalization for stabilization of right hip fracture with surgical intervention.  She does not take anticoagulants.  She will be transferred to [facility] for evaluation and treatment by the orthopedic surgeon, Dr. Mable Fill.  Admission to hospitalist  service.           Final Clinical Impression(s) / ED Diagnoses Final diagnoses:  S/P right hip fracture  Contusion, multiple sites  Osteoarthritis of right shoulder, unspecified osteoarthritis type    Rx / DC Orders ED Discharge Orders     None         Daleen Bo, MD 02/01/22 1749

## 2022-02-01 NOTE — ED Triage Notes (Addendum)
Patient c/o right hip, right shoulder, and right arm. Per patient fell coming out door. Patient states she landed on right side. Per patient hit face but denies any LOC, dizziness, or vomiting. Denies taking any type of anticoagulants. Husband reports rotation of leg.

## 2022-02-01 NOTE — Progress Notes (Signed)
Patient discussed with [facility] EDP. Per EDP patient ambulatory and living independently prior to injury.  Imaging reviewed, demonstrates R valgus impacted femoral neck fx.  Recommendations: - Transfer to [facility] medical service - NWB RLE - Pain control - NPO midnight - Preop clearance/risk stratification/optimization per medicine/primary team - Plan for surgical fixation 02/02/2022 AM  Full consult note to follow.  Georgeanna Harrison M.D. Orthopaedic Surgery [facility]

## 2022-02-02 ENCOUNTER — Emergency Department (HOSPITAL_COMMUNITY): Payer: Medicare Other | Admitting: Critical Care Medicine

## 2022-02-02 ENCOUNTER — Emergency Department (HOSPITAL_COMMUNITY): Payer: Medicare Other

## 2022-02-02 ENCOUNTER — Emergency Department (EMERGENCY_DEPARTMENT_HOSPITAL): Payer: Medicare Other | Admitting: Critical Care Medicine

## 2022-02-02 ENCOUNTER — Encounter (HOSPITAL_COMMUNITY): Payer: Self-pay | Admitting: Family Medicine

## 2022-02-02 ENCOUNTER — Encounter (HOSPITAL_COMMUNITY)
Admission: EM | Disposition: A | Discharge status: Rehabilitation Facility | Payer: Self-pay | Source: Home / Self Care | Attending: Internal Medicine

## 2022-02-02 DIAGNOSIS — S72011A Unspecified intracapsular fracture of right femur, initial encounter for closed fracture: Secondary | ICD-10-CM

## 2022-02-02 DIAGNOSIS — S728X1D Other fracture of right femur, subsequent encounter for closed fracture with routine healing: Secondary | ICD-10-CM

## 2022-02-02 DIAGNOSIS — K219 Gastro-esophageal reflux disease without esophagitis: Secondary | ICD-10-CM | POA: Diagnosis not present

## 2022-02-02 DIAGNOSIS — I10 Essential (primary) hypertension: Secondary | ICD-10-CM | POA: Diagnosis not present

## 2022-02-02 HISTORY — PX: HIP PINNING,CANNULATED: SHX1758

## 2022-02-02 LAB — CBC
HCT: 42.4 % (ref 36.0–46.0)
Hemoglobin: 13.2 g/dL (ref 12.0–15.0)
MCH: 28.1 pg (ref 26.0–34.0)
MCHC: 31.1 g/dL (ref 30.0–36.0)
MCV: 90.2 fL (ref 80.0–100.0)
Platelets: 292 10*3/uL (ref 150–400)
RBC: 4.7 MIL/uL (ref 3.87–5.11)
RDW: 16 % — ABNORMAL HIGH (ref 11.5–15.5)
WBC: 15.7 10*3/uL — ABNORMAL HIGH (ref 4.0–10.5)
nRBC: 0 % (ref 0.0–0.2)

## 2022-02-02 LAB — CREATININE, SERUM
Creatinine, Ser: 1.02 mg/dL — ABNORMAL HIGH (ref 0.44–1.00)
GFR, Estimated: 55 mL/min — ABNORMAL LOW (ref >60)

## 2022-02-02 LAB — SURGICAL PCR SCREEN
MRSA, PCR: NEGATIVE
Staphylococcus aureus: POSITIVE — AB

## 2022-02-02 SURGERY — FIXATION, FEMUR, NECK, PERCUTANEOUS, USING SCREW
Anesthesia: General | Site: Hip | Laterality: Right

## 2022-02-02 MED ORDER — BUPIVACAINE-EPINEPHRINE (PF) 0.5% -1:200000 IJ SOLN
INTRAMUSCULAR | Status: DC | PRN
  Administered 2022-02-02: 30 mL via PERINEURAL

## 2022-02-02 MED ORDER — ACETAMINOPHEN 10 MG/ML IV SOLN
INTRAVENOUS | Status: DC | PRN
  Administered 2022-02-02: 1000 mg via INTRAVENOUS

## 2022-02-02 MED ORDER — DOCUSATE SODIUM 100 MG PO CAPS
100.0000 mg | ORAL_CAPSULE | Freq: Two times a day (BID) | ORAL | Status: DC
  Administered 2022-02-02 – 2022-02-04 (×4): 100 mg via ORAL
  Filled 2022-02-02 (×4): qty 1

## 2022-02-02 MED ORDER — FENTANYL CITRATE (PF) 100 MCG/2ML IJ SOLN
INTRAMUSCULAR | Status: AC
  Filled 2022-02-02: qty 2

## 2022-02-02 MED ORDER — PROPOFOL 10 MG/ML IV BOLUS
INTRAVENOUS | Status: AC
  Filled 2022-02-02: qty 20

## 2022-02-02 MED ORDER — CHLORHEXIDINE GLUCONATE 0.12 % MT SOLN
OROMUCOSAL | Status: AC
  Administered 2022-02-02: 15 mL via OROMUCOSAL
  Filled 2022-02-02: qty 15

## 2022-02-02 MED ORDER — VANCOMYCIN HCL 1000 MG IV SOLR
INTRAVENOUS | Status: DC | PRN
Start: 2022-02-02 — End: 2022-02-02
  Administered 2022-02-02: 1000 mg via TOPICAL

## 2022-02-02 MED ORDER — ROCURONIUM BROMIDE 10 MG/ML (PF) SYRINGE
PREFILLED_SYRINGE | INTRAVENOUS | Status: AC
  Filled 2022-02-02: qty 10

## 2022-02-02 MED ORDER — 0.9 % SODIUM CHLORIDE (POUR BTL) OPTIME
TOPICAL | Status: DC | PRN
  Administered 2022-02-02: 1000 mL

## 2022-02-02 MED ORDER — ONDANSETRON HCL 4 MG/2ML IJ SOLN
INTRAMUSCULAR | Status: AC
  Filled 2022-02-02: qty 2

## 2022-02-02 MED ORDER — DEXAMETHASONE SODIUM PHOSPHATE 10 MG/ML IJ SOLN
INTRAMUSCULAR | Status: DC | PRN
  Administered 2022-02-02: 4 mg via INTRAVENOUS

## 2022-02-02 MED ORDER — SUGAMMADEX SODIUM 200 MG/2ML IV SOLN
INTRAVENOUS | Status: DC | PRN
  Administered 2022-02-02: 200 mg via INTRAVENOUS

## 2022-02-02 MED ORDER — ONDANSETRON HCL 4 MG/2ML IJ SOLN: 4.0000 mg | Freq: Four times a day (QID) | INTRAMUSCULAR | Status: DC | PRN

## 2022-02-02 MED ORDER — PROPOFOL 10 MG/ML IV BOLUS
INTRAVENOUS | Status: DC | PRN
Start: 2022-02-02 — End: 2022-02-02
  Administered 2022-02-02: 10 mg via INTRAVENOUS
  Administered 2022-02-02: 20 mg via INTRAVENOUS
  Administered 2022-02-02: 70 mg via INTRAVENOUS

## 2022-02-02 MED ORDER — FENTANYL CITRATE (PF) 100 MCG/2ML IJ SOLN
25.0000 ug | INTRAMUSCULAR | Status: DC | PRN
  Administered 2022-02-02: 50 ug via INTRAVENOUS

## 2022-02-02 MED ORDER — MENTHOL 3 MG MT LOZG: 1.0000 | LOZENGE | OROMUCOSAL | Status: DC | PRN

## 2022-02-02 MED ORDER — ORAL CARE MOUTH RINSE: 15.0000 mL | Freq: Once | OROMUCOSAL | Status: AC

## 2022-02-02 MED ORDER — PHENYLEPHRINE 80 MCG/ML (10ML) SYRINGE FOR IV PUSH (FOR BLOOD PRESSURE SUPPORT)
PREFILLED_SYRINGE | INTRAVENOUS | Status: AC
  Filled 2022-02-02: qty 10

## 2022-02-02 MED ORDER — VANCOMYCIN HCL 1000 MG IV SOLR
INTRAVENOUS | Status: AC
  Filled 2022-02-02: qty 20

## 2022-02-02 MED ORDER — FENTANYL CITRATE (PF) 250 MCG/5ML IJ SOLN
INTRAMUSCULAR | Status: DC | PRN
  Administered 2022-02-02: 25 ug via INTRAVENOUS
  Administered 2022-02-02 (×2): 50 ug via INTRAVENOUS
  Administered 2022-02-02: 25 ug via INTRAVENOUS

## 2022-02-02 MED ORDER — PHENYLEPHRINE 80 MCG/ML (10ML) SYRINGE FOR IV PUSH (FOR BLOOD PRESSURE SUPPORT)
PREFILLED_SYRINGE | INTRAVENOUS | Status: DC | PRN
  Administered 2022-02-02: 160 ug via INTRAVENOUS
  Administered 2022-02-02 (×2): 80 ug via INTRAVENOUS

## 2022-02-02 MED ORDER — TRANEXAMIC ACID-NACL 1000-0.7 MG/100ML-% IV SOLN
1000.0000 mg | Freq: Once | INTRAVENOUS | Status: AC
  Administered 2022-02-02: 1000 mg via INTRAVENOUS
  Filled 2022-02-02: qty 100

## 2022-02-02 MED ORDER — ROCURONIUM BROMIDE 10 MG/ML (PF) SYRINGE
PREFILLED_SYRINGE | INTRAVENOUS | Status: DC | PRN
  Administered 2022-02-02: 60 mg via INTRAVENOUS

## 2022-02-02 MED ORDER — LIDOCAINE 2% (20 MG/ML) 5 ML SYRINGE
INTRAMUSCULAR | Status: DC | PRN
Start: 2022-02-02 — End: 2022-02-02
  Administered 2022-02-02: 60 mg via INTRAVENOUS

## 2022-02-02 MED ORDER — CHLORHEXIDINE GLUCONATE 0.12 % MT SOLN: 15.0000 mL | Freq: Once | OROMUCOSAL | Status: AC

## 2022-02-02 MED ORDER — DEXAMETHASONE SODIUM PHOSPHATE 10 MG/ML IJ SOLN
INTRAMUSCULAR | Status: AC
  Filled 2022-02-02: qty 1

## 2022-02-02 MED ORDER — ACETAMINOPHEN 10 MG/ML IV SOLN
INTRAVENOUS | Status: AC
  Filled 2022-02-02: qty 100

## 2022-02-02 MED ORDER — ONDANSETRON HCL 4 MG/2ML IJ SOLN
INTRAMUSCULAR | Status: DC | PRN
  Administered 2022-02-02: 4 mg via INTRAVENOUS

## 2022-02-02 MED ORDER — LIDOCAINE 2% (20 MG/ML) 5 ML SYRINGE
INTRAMUSCULAR | Status: AC
  Filled 2022-02-02: qty 5

## 2022-02-02 MED ORDER — ONDANSETRON HCL 4 MG PO TABS: 4.0000 mg | ORAL_TABLET | Freq: Four times a day (QID) | ORAL | Status: DC | PRN

## 2022-02-02 MED ORDER — DIPHENHYDRAMINE HCL 25 MG PO CAPS
25.0000 mg | ORAL_CAPSULE | Freq: Once | ORAL | Status: AC
  Administered 2022-02-02: 25 mg via ORAL
  Filled 2022-02-02: qty 1

## 2022-02-02 MED ORDER — CEFAZOLIN SODIUM-DEXTROSE 2-4 GM/100ML-% IV SOLN
2.0000 g | Freq: Four times a day (QID) | INTRAVENOUS | Status: AC
  Administered 2022-02-02 (×2): 2 g via INTRAVENOUS
  Filled 2022-02-02 (×2): qty 100

## 2022-02-02 MED ORDER — PHENYLEPHRINE HCL-NACL 20-0.9 MG/250ML-% IV SOLN
INTRAVENOUS | Status: DC | PRN
  Administered 2022-02-02: 40 ug/min via INTRAVENOUS

## 2022-02-02 MED ORDER — PHENOL 1.4 % MT LIQD: 1.0000 | OROMUCOSAL | Status: DC | PRN

## 2022-02-02 MED ORDER — FENTANYL CITRATE (PF) 250 MCG/5ML IJ SOLN
INTRAMUSCULAR | Status: AC
  Filled 2022-02-02: qty 5

## 2022-02-02 MED ORDER — ENOXAPARIN SODIUM 40 MG/0.4ML IJ SOSY
40.0000 mg | PREFILLED_SYRINGE | INTRAMUSCULAR | Status: DC
  Administered 2022-02-03 – 2022-02-04 (×2): 40 mg via SUBCUTANEOUS
  Filled 2022-02-02 (×2): qty 0.4

## 2022-02-02 SURGICAL SUPPLY — 54 items
ADH SKN CLS LQ APL DERMABOND (GAUZE/BANDAGES/DRESSINGS) ×1
BAG COUNTER SPONGE SURGICOUNT (BAG) ×2 IMPLANT
BAG SPNG CNTER NS LX DISP (BAG) ×1
BIT DRILL 5.0 CANN LRG 215 (BIT) ×1 IMPLANT
COVER PERINEAL POST (MISCELLANEOUS) ×2 IMPLANT
COVER SURGICAL LIGHT HANDLE (MISCELLANEOUS) ×2 IMPLANT
DERMABOND ADHESIVE PROPEN (GAUZE/BANDAGES/DRESSINGS) ×1
DERMABOND ADVANCED .7 DNX6 (GAUZE/BANDAGES/DRESSINGS) IMPLANT
DRAPE C-ARM 42X72 X-RAY (DRAPES) ×1 IMPLANT
DRAPE C-ARMOR (DRAPES) ×1 IMPLANT
DRAPE STERI IOBAN 125X83 (DRAPES) ×2 IMPLANT
DRESSING MEPILEX FLEX 4X4 (GAUZE/BANDAGES/DRESSINGS) IMPLANT
DRSG ADAPTIC 3X8 NADH LF (GAUZE/BANDAGES/DRESSINGS) ×2 IMPLANT
DRSG AQUACEL AG ADV 3.5X 6 (GAUZE/BANDAGES/DRESSINGS) ×1 IMPLANT
DRSG MEPILEX BORDER 4X4 (GAUZE/BANDAGES/DRESSINGS) ×1 IMPLANT
DRSG MEPILEX FLEX 4X4 (GAUZE/BANDAGES/DRESSINGS) ×2
DURAPREP 26ML APPLICATOR (WOUND CARE) ×1 IMPLANT
ELECT REM PT RETURN 9FT ADLT (ELECTROSURGICAL) ×2
ELECTRODE REM PT RTRN 9FT ADLT (ELECTROSURGICAL) ×1 IMPLANT
FACESHIELD WRAPAROUND (MASK) IMPLANT
FACESHIELD WRAPAROUND OR TEAM (MASK) ×2 IMPLANT
GLOVE BIO SURGEON STRL SZ8 (GLOVE) ×7 IMPLANT
GLOVE BIOGEL PI IND STRL 8 (GLOVE) ×1 IMPLANT
GLOVE BIOGEL PI INDICATOR 8 (GLOVE) ×2
GOWN STRL REUS W/ TWL LRG LVL3 (GOWN DISPOSABLE) ×1 IMPLANT
GOWN STRL REUS W/ TWL XL LVL3 (GOWN DISPOSABLE) ×3 IMPLANT
GOWN STRL REUS W/TWL 2XL LVL3 (GOWN DISPOSABLE) ×2 IMPLANT
GOWN STRL REUS W/TWL LRG LVL3 (GOWN DISPOSABLE)
GOWN STRL REUS W/TWL XL LVL3 (GOWN DISPOSABLE) ×2
GUIDEWIRE THREADED 2.8 (WIRE) ×3 IMPLANT
KIT TURNOVER KIT B (KITS) ×2 IMPLANT
MANIFOLD NEPTUNE II (INSTRUMENTS) ×2 IMPLANT
NS IRRIG 1000ML POUR BTL (IV SOLUTION) ×2 IMPLANT
PACK GENERAL/GYN (CUSTOM PROCEDURE TRAY) ×2 IMPLANT
PAD ARMBOARD 7.5X6 YLW CONV (MISCELLANEOUS) ×4 IMPLANT
SCREW CANN ST 6.5X75 (Screw) ×1 IMPLANT
SCREW CANN ST 6.5X80 (Screw) ×1 IMPLANT
SCREW CANN ST 6.5X85 (Screw) ×1 IMPLANT
STAPLER VISISTAT 35W (STAPLE) ×2 IMPLANT
STRIP CLOSURE SKIN 1/2X4 (GAUZE/BANDAGES/DRESSINGS) ×1 IMPLANT
SUT MNCRL AB 3-0 PS2 27 (SUTURE) ×1 IMPLANT
SUT MON AB 2-0 CT1 36 (SUTURE) ×1 IMPLANT
SUT PDS AB 1 CT  36 (SUTURE) ×2
SUT PDS AB 1 CT 36 (SUTURE) IMPLANT
SUT VIC AB 0 CT1 27 (SUTURE) ×2
SUT VIC AB 0 CT1 27XBRD ANBCTR (SUTURE) IMPLANT
SUT VIC AB 1 CT1 27 (SUTURE) ×2
SUT VIC AB 1 CT1 27XBRD ANBCTR (SUTURE) ×1 IMPLANT
SUT VIC AB 2-0 CT1 36 (SUTURE) ×2 IMPLANT
TOWEL GREEN STERILE (TOWEL DISPOSABLE) ×2 IMPLANT
TOWEL GREEN STERILE FF (TOWEL DISPOSABLE) ×2 IMPLANT
WASHER FOR 6.5 CANN SCREW (Washer) ×4 IMPLANT
WASHER ORTH 6.5 CANN SCR (Washer) IMPLANT
WATER STERILE IRR 1000ML POUR (IV SOLUTION) ×2 IMPLANT

## 2022-02-02 NOTE — Progress Notes (Addendum)
[facility]                                                                               [name], is a 81 y.o. female, DOB - Mar 05, 1941, WUJ:[id number] Admit date - 02/01/2022    Outpatient Primary MD for the patient is [name], Edwinna Areola, MD  LOS - 1  days    Brief summary   [name] is a 81 y.o. female with medical history significant of hypertension, hypothyroidism, osteopenia, anxiety/depression, GERD.  Patient seen after a mechanical fall .The patient who stepping into garage and missed step, falling on her right side.  She had pain and difficulty walking.  She was brought to the hospital by EMS.  X-rays show a right subcapital femoral fracture. Orthopedics consulted and underwent ORIF.     Assessment & Plan    Assessment and Plan:  Right sub capital femoral fracture:  S/p ORIF.  Pain control.  Therapy eval in am.     Hypertension;  Well controlled.  Continue with losartan, norvasc, .    Hypothyroidism:  Continue with synthroid.    Anxiety and depression:  Resume wellbutrin XL.    GERD Stable.    Leukocytosis:  Wbc count 14,400.  Get UA. Repeat CBC in am.     Estimated body mass index is 28.23 kg/m as calculated from the following:   Height as of this encounter: 5' 4.49" (1.638 m).   Weight as of this encounter: 75.8 kg.  Code Status: full code.  DVT Prophylaxis:  SCDs Start: 02/01/22 2109   Level of Care: Level of care: Med-Surg Family Communication: none at bedside.   Disposition Plan:    pending.   Procedures:  Open treatment of right valgus impacted subcapital femoral neck fracture, with internal fixation  Operative use of fluoroscopy for above procedure  Consultants:   Orthopedics.   Antimicrobials:   Anti-infectives (From admission, onward)    Start     Dose/Rate Route Frequency Ordered Stop   02/02/22 1215  ceFAZolin (ANCEF) IVPB 2g/100 mL premix        2 g 200 mL/hr over 30 Minutes Intravenous Every 6  hours 02/02/22 1201 02/03/22 0014   02/02/22 0852  vancomycin (VANCOCIN) powder  Status:  Discontinued          As needed 02/02/22 0853 02/02/22 0924   02/02/22 0600  ceFAZolin (ANCEF) IVPB 2g/100 mL premix        2 g 200 mL/hr over 30 Minutes Intravenous On call to O.R. 02/01/22 2105 02/02/22 0752        Medications  Scheduled Meds:  [NFA Hold] amLODipine  10 mg Oral Daily   [MAR Hold] amphetamine-dextroamphetamine  30 mg Oral BID WC   [MAR Hold] buPROPion  300 mg Oral Daily   chlorhexidine  60 mL Topical Once   fentaNYL       [MAR Hold] levothyroxine  50 mcg Oral Q0600   [MAR Hold] losartan  100 mg Oral Daily   [MAR Hold] polyvinyl alcohol  2 drop Both Eyes BID   [MAR Hold] senna  1 tablet Oral BID   Continuous Infusions:  sodium chloride 125 mL/hr  at 02/02/22 0655   sodium chloride 1,000 mL (02/01/22 2142)    ceFAZolin (ANCEF) IV     tranexamic acid     PRN Meds:.fentaNYL (SUBLIMAZE) injection, [MAR Hold]  morphine injection, [MAR Hold] ondansetron **OR** [MAR Hold] ondansetron (ZOFRAN) IV    Subjective:   [name] was seen and examined today.  Pt sleeping.   Objective:   Vitals:   02/02/22 1115 02/02/22 1145 02/02/22 1215 02/02/22 1245  BP: (!) 152/77 (!) 152/75 (!) 160/81 (!) 166/69  Pulse: 68 68 (!) 59 61  Resp: '18 14 10 12  '$ Temp:      TempSrc:      SpO2: 96% 99% 100% 100%  Weight:      Height:        Intake/Output Summary (Last 24 hours) at 02/02/2022 1347 Last data filed at 02/02/2022 0908 Gross per 24 hour  Intake 200 ml  Output 50 ml  Net 150 ml   Filed Weights   02/01/22 1512 02/02/22 0646  Weight: 75.8 kg 75.8 kg     Exam General exam: Appears calm and comfortable  Respiratory system: Clear to auscultation. Respiratory effort normal. Cardiovascular system: S1 & S2 heard, RRR. No JVD,  No pedal edema. Gastrointestinal system: Abdomen is nondistended, soft and nontender. Normal bowel sounds heard. Central nervous system: Alert and  oriented to self.  Extremities: Symmetric 5 x 5 power. Skin: No rashes, Psychiatry: Mood & affect appropriate.   Data Reviewed:  I have personally reviewed following labs and imaging studies   CBC Lab Results  Component Value Date   WBC 14.4 (H) 02/01/2022   RBC 4.95 02/01/2022   HGB 13.8 02/01/2022   HCT 44.0 02/01/2022   MCV 88.9 02/01/2022   MCH 27.9 02/01/2022   PLT 383 02/01/2022   MCHC 31.4 02/01/2022   RDW 15.7 (H) 02/01/2022   LYMPHSABS 3.1 02/01/2022   MONOABS 0.8 02/01/2022   EOSABS 0.3 02/01/2022   BASOSABS 0.1 83/41/9622     Last metabolic panel Lab Results  Component Value Date   NA 140 02/01/2022   K 3.6 02/01/2022   CL 105 02/01/2022   CO2 27 02/01/2022   BUN 18 02/01/2022   CREATININE 0.98 02/01/2022   GLUCOSE 98 02/01/2022   GFRNONAA 58 (L) 02/01/2022   GFRAA >60 09/02/2018   CALCIUM 9.6 02/01/2022   PROT 6.6 05/23/2016   ALBUMIN 3.9 05/23/2016   BILITOT 0.5 05/23/2016   ALKPHOS 74 05/23/2016   AST 32 05/23/2016   ALT 38 (H) 05/23/2016   ANIONGAP 8 02/01/2022    CBG (last 3)  No results for input(s): GLUCAP in the last 72 hours.    Coagulation Profile: Recent Labs  Lab 02/01/22 1733  INR 1.0     Radiology Studies: DG Shoulder Right  Result Date: 02/01/2022 CLINICAL DATA:  Pain after fall EXAM: RIGHT SHOULDER - 2+ VIEW COMPARISON:  Chest x-ray September 13, 2020 FINDINGS: The right humerus is high riding consistent with rotator cuff pathology/tear. Irregularity of the radial head is favored to be chronic. No convincing evidence of acute fracture. Glenohumeral degenerative changes are identified. AC joint degenerative changes are identified. No other acute abnormalities. No dislocation. IMPRESSION: 1. High riding right humerus consistent with rotator cuff pathology/tear. 2. Irregularity and flattening of the humeral head is favored to be nonacute. No convincing evidence of acute fracture. No dislocation. 3. Severe glenohumeral degenerative  changes with significant loss of joint space. Electronically Signed   By: Dorise Bullion III M.D.  On: 02/01/2022 16:31   DG Elbow Complete Right  Result Date: 02/01/2022 CLINICAL DATA:  Pain after fall EXAM: RIGHT ELBOW - COMPLETE 3+ VIEW COMPARISON:  None Available. FINDINGS: There is no evidence of fracture, dislocation, or joint effusion. There is no evidence of arthropathy or other focal bone abnormality. Soft tissues are unremarkable. IMPRESSION: Negative. Electronically Signed   By: Dorise Bullion III M.D.   On: 02/01/2022 16:34   DG C-Arm 1-60 Min-No Report  Result Date: 02/02/2022 Fluoroscopy was utilized by the requesting physician.  No radiographic interpretation.   DG HIP UNILAT WITH PELVIS 2-3 VIEWS RIGHT  Result Date: 02/02/2022 CLINICAL DATA:  ORIF RIGHT hip fracture. EXAM: DG HIP (WITH OR WITHOUT PELVIS) 3V RIGHT COMPARISON:  02/01/2022 radiographs FINDINGS: Intraoperative spot views of the RIGHT hip are submitted postoperatively for interpretation. 3 surgical screws are noted traversing the RIGHT femoral neck fracture which appears in near anatomic alignment and position. No gross complicating features are noted. IMPRESSION: Internal fixation of RIGHT femoral neck fracture. Electronically Signed   By: Margarette Canada M.D.   On: 02/02/2022 10:17   DG Hip Unilat With Pelvis 2-3 Views Right  Result Date: 02/01/2022 CLINICAL DATA:  Pain after fall EXAM: DG HIP (WITH OR WITHOUT PELVIS) 2-3V RIGHT COMPARISON:  None Available. FINDINGS: The patient is status post left hip replacement. Visualized portions of the left hip hardware are in good position. The pelvic bones are intact. There is a mildly impacted subcapital fracture in the right hip. No dislocation. No other acute abnormalities. IMPRESSION: Mildly impacted subcapital right hip fracture. Electronically Signed   By: Dorise Bullion III M.D.   On: 02/01/2022 16:32   DG Knee AP/LAT W/Sunrise Left  Result Date: 02/02/2022 CLINICAL  DATA:  Pain during hip procedure. EXAM: LEFT KNEE 3 VIEWS COMPARISON:  None Available. FINDINGS: Degenerative changes in the lateral compartment with mild loss of joint space. No fractures, dislocations, or effusions, or other cause for pain identified. IMPRESSION: Lateral compartment degenerative changes. Electronically Signed   By: Dorise Bullion III M.D.   On: 02/02/2022 13:19       Hosie Poisson M.D. [facility] 02/02/2022, 1:47 PM  Available via Epic secure chat 7am-7pm After 7 pm, please refer to night coverage provider listed on amion.

## 2022-02-02 NOTE — Anesthesia Preprocedure Evaluation (Addendum)
Anesthesia Evaluation  Patient identified by MRN, date of birth, ID band Patient awake    Reviewed: Allergy & Precautions, H&P , NPO status , Patient's Chart, lab work & pertinent test results  History of Anesthesia Complications (+) PONV and history of anesthetic complications  Airway Mallampati: III  TM Distance: >3 FB Neck ROM: Full    Dental no notable dental hx. (+) Edentulous Upper, Edentulous Lower, Dental Advisory Given   Pulmonary asthma , former smoker,    Pulmonary exam normal breath sounds clear to auscultation       Cardiovascular hypertension, Pt. on medications  Rhythm:Regular Rate:Normal     Neuro/Psych Anxiety Depression negative neurological ROS     GI/Hepatic Neg liver ROS, GERD  Controlled,  Endo/Other  Hypothyroidism   Renal/GU negative Renal ROS  negative genitourinary   Musculoskeletal  (+) Arthritis , Osteoarthritis,    Abdominal   Peds  Hematology negative hematology ROS (+)   Anesthesia Other Findings   Reproductive/Obstetrics negative OB ROS                            Anesthesia Physical Anesthesia Plan  ASA: 2  Anesthesia Plan: General   Post-op Pain Management: Regional block* and Tylenol PO (pre-op)*   Induction: Intravenous  PONV Risk Score and Plan: 4 or greater and Ondansetron and Dexamethasone  Airway Management Planned: Oral ETT  Additional Equipment:   Intra-op Plan:   Post-operative Plan: Extubation in OR  Informed Consent: I have reviewed the patients History and Physical, chart, labs and discussed the procedure including the risks, benefits and alternatives for the proposed anesthesia with the patient or authorized representative who has indicated his/her understanding and acceptance.     Dental advisory given  Plan Discussed with: CRNA  Anesthesia Plan Comments:         Anesthesia Quick Evaluation

## 2022-02-02 NOTE — Discharge Instructions (Signed)
Discharge instructions for Dr. Georgeanna Harrison, M.D., Orthopaedic Surgeon, [place]:  Diet: As you were doing prior to hospitalization, unless instructed otherwise by medical team, dietary/nutrition team, etc. Dressing:  Keep AQUACEL on and dry for up to 14 days.  Do not allow surgical area to get wet before that.  Remove AQUACEL dressing after 14 days and allow area to get wet in shower but DO NOT SUBMERGE until wound is evaluated in clinic.  In most cases skin glue is used and no additional dressing is necessary.  Shower:  Unless otherwise specified, may shower but keep the wounds dry, use an occlusive plastic wrap, NO SOAKING IN TUB.  If the bandage gets wet, change with a clean dry gauze.  Unless otherwise specified, after 5 days dressing(s) should be removed (7 days for PREVENA dressings) and wound(s) may get wet in the shower by allowing water to gently run over.  Again, no soaking in tub, and do NOT submerge for at least 2 weeks!!! Activity:  Increase activity slowly as tolerated.  If you right leg is injured or immobilized, no driving for 6 weeks or until discussed with your surgeon.  If you have an injury or immobilization of the left lower extremity you may not operate a clutch. Please note that driving with any kind of immobilization for the upper extremity (sling, shoulder brace, splint, cast, etc.) may also be considered impaired driving and should not be attempted. Weight Bearing: WEIGHTBEARING AS TOLERATED (WBAT) on the RIGHT LOWER EXTREMITY. To prevent constipation: You may use over-the-counter stool softener(s) such as Colace (over the counter) 100 mg by mouth twice a day and/or Miralax (over the counter) for constipation as needed.  Drink plenty of fluids (prune juice may be helpful) and high fiber foods.  Itching:  If you experience itching with your medications, try taking only a single pain pill, or even half a pain pill at a time.  You can also use  benadryl over the counter for itching or also to help with sleep.  Precautions:  If you experience chest pain or shortness of breath - call 911 immediately for transfer to the hospital emergency department!! Medications: Please contact the clinic during office hours (Monday through Friday, 0800 to 1600) if you need a refill on any medications.  Please monitor medications and allow 24 to 48 hours to process refill request!!!!  Please note that only medications directly related to the surgery can be prescribed.  For other medications (e.g. blood pressure medicines, sleeping medicines, etc.), please contact the prescribing physician or your primary care provider. DVT Prophylaxis: Lovenox x6 weeks postoperatively.  If you develop a fever greater that 101.1 deg F, purulent drainage from wound, increased redness or drainage from wound, or calf pain -- Call the office at [phone].

## 2022-02-02 NOTE — ED Notes (Signed)
Report given to [facility], ETA of 15 mins 

## 2022-02-02 NOTE — Progress Notes (Signed)
Pt arrived to unit alert and oriented x4, no c/o pain husband at bedside.

## 2022-02-02 NOTE — Consult Note (Signed)
Orthopaedic Consult  Date/Time: 02/02/22 6:36 AM  Patient Name: [name]  Attending Physician: Hosie Poisson, MD    ASSESSMENT & PLAN  Orthopaedic Assessment: 81 y.o. female with right valgus impacted femoral neck fracture after mechanical fall.  Reductions/Procedures/Splinting/Anesthesia Performed: Reductions: None Splinting/casting: None Procedure(s): None Anesthesia: N/A  Plan: Patient has been evaluated by the medical service and cleared and medically optimized for surgery.  As the patient previously lived independently and was ambulatory prior to the injury, she has opted to proceed with internal fixation of her right valgus impacted femoral neck fracture to reduce the risk of subsequent displacement, and to expedite return to weightbearing.  Risks of the proposed surgical treatment were discussed with the patient, including bleeding, wound healing complications, infection, damage to surrounding structures, persistent pain, stiffness, lack of improvement, potential for subsequent arthritis or worsening of pre-existing arthritis, nonunion, malunion, and need for further surgery, as well as complications related to anesthesia, cardiovascular complications, and death.  All questions were answered to the patient's satisfaction.  She understands all of this and wishes to proceed with surgery.   Georgeanna Harrison M.D. Orthopaedic Surgery [facility]   Medical Decision Making  Amount/complexity of data: Is there a pathologic fracture (e.g. neoplastic, osteoporotic insufficiency fracture)? Yes Independent interpretation of radiographic studies: Yes Review of radiology results (e.g. reports): Yes Tests ordered (e.g. additional radiographic studies, labs): Yes Lab results reviewed: Yes Reviewed old records: Yes History from another source (independent historian, e.g. family/friend/etc.): No Risk: Patient receiving IV controlled substances for pain:  Yes Fracture requiring manipulation: No Urgent or emergent (non-elective) surgery likely this admission: Yes Presence of medical comorbidities and/or surgical risk factors (e.g. current smoker, CAD, diabetes, COPD, CKD, etc.): Yes Closed fracture management WITHOUT manipulation: No Urgent minor procedure (e.g. joint aspiration, compartment pressure measurement, etc.): No Will likely need surgery as an outpatient: No     HPI [name] is a 81 y.o. female. Orthopaedic consultation has specifically been requested to address this patient's current musculoskeletal presentation. She sustained a fall and had immediate pain in the right hip/thigh.  Pain is sharp and severe.  Pain is worse movement and better with rest and immobilization.  Since the fall the patient was not able to ambulate or bear weight due to pain.  At baseline the patient is ambulatory with no assistive devices.  At baseline she does also report issues with her right shoulder.  She states that she "babies it" a lot, due to ongoing pain in the shoulder and difficulty with movement.  Her fall exacerbated her baseline right shoulder pain.  She denies knowledge of other injuries.    PMH Past Medical History:  Diagnosis Date   ALLERGIC RHINITIS 11/30/2008   ALLERGIC RHINITIS 11/30/2008   Anxiety    ASTHMA UNSPECIFIED WITH EXACERBATION 02/17/2008   pt. states she does not and has never has asthma   CAROTID BRUIT, LEFT 04/19/2007   Closed fracture of lateral malleolus 06/04/2009   CLOSED FRACTURE OF METATARSAL BONE 06/04/2009   Depression    Esophageal reflux 11/08/2008   GANGLION CYST 04/19/2007   HYPERTENSION 04/07/2007   Hypothyroidism    INSOMNIA 08/07/2010   OSTEOARTHRITIS 04/07/2007   OSTEOPENIA 03/10/2008   PONV (postoperative nausea and vomiting)    UTI 07/20/2009     PSH Past Surgical History:  Procedure Laterality Date   EYE SURGERY Bilateral    cataract removal   FUNCTIONAL ENDOSCOPIC SINUS SURGERY  01/2018   spinal  fluid  leaking behind right eye   JOINT REPLACEMENT     knuckle   REVERSE SHOULDER ARTHROPLASTY Left 09/09/2018   REVERSE SHOULDER ARTHROPLASTY Left 09/09/2018   Procedure: REVERSE SHOULDER ARTHROPLASTY;  Surgeon: Justice Britain, MD;  Location: [facility];  Service: Orthopedics;  Laterality: Left;  138mn   TOTAL HIP ARTHROPLASTY     TUBAL LIGATION     Home Medications Prior to Admission medications   Medication Sig Start Date End Date Taking? Authorizing Provider  amLODipine (NORVASC) 10 MG tablet Take 1 tablet (10 mg total) by mouth daily. 08/07/21  Yes KDorie Rank MD  amphetamine-dextroamphetamine (ADDERALL) 30 MG tablet Take 1 tablet by mouth 2 (two) times daily. Fill in 2 months 12/07/17  Yes KMarletta Lor MD  aspirin-acetaminophen-caffeine ([facility]) [id number] per tablet Take 1 tablet by mouth every 6 (six) hours as needed. Patient taking differently: Take 1 tablet by mouth daily as needed for headache or migraine. 09/02/13  Yes JRicard Dillon MD  buPROPion (WELLBUTRIN XL) 300 MG 24 hr tablet TAKE 1 TABLET BY MOUTH ONCE DAILY Patient taking differently: Take 300 mg by mouth daily. 03/02/18  Yes KMarletta Lor MD  diclofenac sodium (VOLTAREN) 1 % GEL APPLY   TOPICALLY TO AFFECTED AREA 4 TIMES DAILY AS NEEDED FOR PAIN Patient taking differently: Apply 1 g topically 4 (four) times daily as needed. FOR PAIN 12/07/17  Yes KMarletta Lor MD  hydrochlorothiazide (MICROZIDE) 12.5 MG capsule Take 12.5 mg by mouth daily. TAKE 1 TABLET BY MOUTH ONCE DAILY.   Yes [provider]  levothyroxine (SYNTHROID) 50 MCG tablet Take 50 mcg by mouth daily. 11/05/21  Yes [provider]  losartan (COZAAR) 100 MG tablet Take 100 mg by mouth daily. TAKE 1 TABLET BY MOUTH ONCE DAILY.   Yes [provider]  Multiple Vitamin (MULTIVITAMIN) capsule Take 1 capsule by mouth daily. Centrum Silver   Yes [provider]  OVER THE COUNTER MEDICATION Take 1  tablet by mouth daily. Joint Support tablet includes:  Primal Celadrine turmeric   Yes [provider]  Polyethyl Glycol-Propyl Glycol 0.4-0.3 % SOLN Place 2 drops into both eyes 2 (two) times daily.   Yes [provider]     Allergies No Known Allergies   Family History Family History  Problem Relation Age of Onset   Heart disease Mother        Mother also has significant carotid artery stenosis   COPD Father    Alcoholism Father    Cancer Brother    Alzheimer's disease Sister    Thyroid disease Sister        Goiter   Breast cancer Neg Hx     Social History Social History   Socioeconomic History   Marital status: Married    Spouse name: Not on file   Number of children: Not on file   Years of education: Not on file   Highest education level: Not on file  Occupational History   Occupation: retired    [employer] farm manager RETIRED  Tobacco Use   Smoking status: Former    Packs/day: 1.50    Years: 20.00    Pack years: 30.00    Types: Cigarettes    Quit date: 09/15/1968    Years since quitting: 53.4   Smokeless tobacco: Never   Tobacco comments:    smoked 1.5 for  20 years  Vaping Use   Vaping Use: Never used  Substance and Sexual Activity   Alcohol use: No  Drug use: No   Sexual activity: Yes  Other Topics Concern   Not on file  Social History Narrative   Married for 50 years. She has 1 son that age 25   Retired from Environmental consultant         Social Determinants of Radio broadcast assistant Strain: Not on file  Food Insecurity: Not on file  Transportation Needs: Not on file  Physical Activity: Not on file  Stress: Not on file  Social Connections: Not on file  Intimate Partner Violence: Not on file     Review of Systems MSK: As noted per HPI above GI: No current Nausea/vomiting ENT: Denies sore throat, epistaxis CV: Denies chest pain  Resp: No current shortness of breath  Other than mentioned above, there are no  Constitutional, Neurological, Psychiatric, ENT, Ophthalmological, Cardiovascular, Respiratory, GI, GU, Musculoskeletal, Integumentary, Lymphatic, Endocrine or Allergic issues.     Imaging  Independent interpretation of orthopaedic-relevant films: Multiple radiographic views of the right hip and pelvis demonstrate valgus impacted femoral neck fracture. Multiple radiographic views of the right shoulder demonstrate proximal migration of the humeral head and chronic degenerative changes consistent with advanced rotator cuff arthropathy.  Otherwise no fractures or acute osseous abnormalities appreciated. Multiple radiographic views of the elbow demonstrate no fracture or acute osseous abnormality.  Radiographic results: DG Shoulder Right  Result Date: 02/01/2022 CLINICAL DATA:  Pain after fall EXAM: RIGHT SHOULDER - 2+ VIEW COMPARISON:  Chest x-ray September 13, 2020 FINDINGS: The right humerus is high riding consistent with rotator cuff pathology/tear. Irregularity of the radial head is favored to be chronic. No convincing evidence of acute fracture. Glenohumeral degenerative changes are identified. AC joint degenerative changes are identified. No other acute abnormalities. No dislocation. IMPRESSION: 1. High riding right humerus consistent with rotator cuff pathology/tear. 2. Irregularity and flattening of the humeral head is favored to be nonacute. No convincing evidence of acute fracture. No dislocation. 3. Severe glenohumeral degenerative changes with significant loss of joint space. Electronically Signed   By: Dorise Bullion III M.D.   On: 02/01/2022 16:31   DG Elbow Complete Right  Result Date: 02/01/2022 CLINICAL DATA:  Pain after fall EXAM: RIGHT ELBOW - COMPLETE 3+ VIEW COMPARISON:  None Available. FINDINGS: There is no evidence of fracture, dislocation, or joint effusion. There is no evidence of arthropathy or other focal bone abnormality. Soft tissues are unremarkable. IMPRESSION: Negative.  Electronically Signed   By: Dorise Bullion III M.D.   On: 02/01/2022 16:34   DG Hip Unilat With Pelvis 2-3 Views Right  Result Date: 02/01/2022 CLINICAL DATA:  Pain after fall EXAM: DG HIP (WITH OR WITHOUT PELVIS) 2-3V RIGHT COMPARISON:  None Available. FINDINGS: The patient is status post left hip replacement. Visualized portions of the left hip hardware are in good position. The pelvic bones are intact. There is a mildly impacted subcapital fracture in the right hip. No dislocation. No other acute abnormalities. IMPRESSION: Mildly impacted subcapital right hip fracture. Electronically Signed   By: Dorise Bullion III M.D.   On: 02/01/2022 16:32   Labs  Recent Labs    02/01/22 1733  WBC 14.4*  HGB 13.8  HCT 44.0  PLT 383   Recent Labs    02/01/22 1733  NA 140  K 3.6  CL 105  CO2 27  BUN 18  CREATININE 0.98  GLUCOSE 98  CALCIUM 9.6   Lab Results  Component Value Date   INR 1.0 02/01/2022  Physical Examination  Patient is a 81 y.o. year old female who is alert, well appearing, and in no distress, mood is calm.  Orientation: oriented to person, place, time, and general circumstances  Vital Signs: BP 137/75   Pulse 89   Temp 98.1 F (36.7 C) (Oral)   Resp 17   Ht 5' 4.5" (1.638 m)   Wt 75.8 kg   SpO2 97%   BMI 28.22 kg/m    Gait: Unable to ambulate due to right lower extremity injury.  Supine on stretcher.  Heart: Normal rate Lungs: Non-labored breathing Abdomen: Soft, Non-tender   Right Upper Extremity: Inspection: Atraumatic; MCP arthrosis Palpation: Mild tenderness to palpation of the proximal humerus/glenohumeral joint, with some reported increase in pain over baseline ROM: Limited at baseline per patient, and unchanged upon examination; forward flexion approximately 90 degrees, with further passive flexion to 120 degrees with pain Joint Stability: No instability Strength: Normal Skin: Intact Peripheral Vascular: Well perfused Reflexes: No  pathologic Sensation: Intact to light touch distally Lymph Nodes: None Palpable Coordination: Intact, normal   Left Upper Extremity: Inspection: Atraumatic; MCP arthrosis Palpation: Nontender ROM: Full, painless Joint Stability: No instability Strength: Normal Skin: Intact; well-healed and benign appearing deltopectoral incision Peripheral Vascular: Well perfused Reflexes: No pathologic Sensation: Intact to light touch distally Lymph Nodes: None Palpable Coordination: Intact, normal    Right Lower Extremity: Inspection: Atraumatic in appearance Palpation: Tender to palpation over hip; small effusion of the knee, mild tenderness to palpation of knee/patella ROM: Hip range of motion severely limited due to injury; difficult to fully evaluate knee due to hip pain Joint Stability: No instability Strength: Normal dorsiflexion, plantarflexion, and EHL strength and function Skin: Intact Peripheral Vascular: Normal DP pulse, warm and well-perfused distally Reflexes: No pathologic Sensation: Intact light touch in the superficial peroneal, deep peroneal, and tibial distributions distally Lymph Nodes: None Palpable Coordination: Limited by injury and pain   Left Lower Extremity: Inspection: Atraumatic Palpation: Nontender ROM: Full, painless Joint Stability: No instability Strength: Normal Skin: Intact Peripheral Vascular: Well perfused Reflexes: No pathologic Sensation: Intact to light touch distally Lymph Nodes: None Palpable Coordination: Intact, normal    Pelvis: Skin: Intact Palpation: Nontender Stability: No instability      The review of the patient's medications does not in any way constitute an endorsement, by this clinician,  of their use, dosage, indications, route, efficacy, interactions, or other clinical parameters.  This note was generated within the [employer] EMR using Dragon medical speech recognition software and may contain inherent errors or omissions not  intended by the user. Grammatical and punctuation errors, random word insertions, deletions, pronoun errors and incomplete sentences are occasional consequences of this technology due to software limitations. Not all errors are caught or corrected.  Although every attempt is made to root out erroneus and incomplete transcription, the note may still not fully represent the intent or opinion of the author. If there are questions or concerns about the content of this note or information contained within the body of this dictation they should be addressed directly with the author for clarification.

## 2022-02-02 NOTE — Plan of Care (Signed)

## 2022-02-02 NOTE — Anesthesia Procedure Notes (Signed)
Anesthesia Regional Block: Peng block   Pre-Anesthetic Checklist: , timeout performed,  Correct Patient, Correct Site, Correct Laterality,  Correct Procedure, Correct Position, site marked,  Risks and benefits discussed,  Pre-op evaluation,  At surgeon's request and post-op pain management  Laterality: Right  Prep: chloraprep       Needles:  Injection technique: Single-shot  Needle Type: Echogenic Stimulator Needle     Needle Length: 9cm  Needle Gauge: 21     Additional Needles:   Procedures:,,,, ultrasound used (permanent image in chart),,    Narrative:  Start time: 02/02/2022 7:18 AM End time: 02/02/2022 7:28 AM  Performed by: Personally  Anesthesiologist: Roderic Palau, MD

## 2022-02-02 NOTE — Op Note (Signed)
OPERATIVE NOTE  CARINNA NEWHART female 81 y.o. 02/02/2022  PREOPERATIVE DIAGNOSIS: Right valgus impacted subcapital femoral neck fracture  POSTOPERATIVE DIAGNOSIS: Right valgus impacted subcapital femoral neck fracture (S72.011)  PROCEDURE(S): Open treatment of right valgus impacted subcapital femoral neck fracture, with internal fixation (14970) Operative use of fluoroscopy for above procedure(s) (26378)   SURGEON: Georgeanna Harrison, M.D.  ASSISTANT(S): None  ANESTHESIA: General  FINDINGS: Preoperative Examination: Right Lower Extremity: Inspection: Atraumatic in appearance Palpation: Tender to palpation over hip; small effusion of the knee, mild tenderness to palpation of knee/patella ROM: Hip range of motion severely limited due to injury; difficult to fully evaluate knee due to hip pain Strength: Normal dorsiflexion, plantarflexion, and EHL strength and function Peripheral Vascular: Normal DP pulse, warm and well-perfused distally Sensation: Intact light touch in the superficial peroneal, deep peroneal, and tibial distributions distally  Operative Findings: Fluoroscopic assessment prior to internal fixation redemonstrated right valgus impacted femoral neck fracture with no interval displacement compared with preoperative imaging.  Appropriate placement of 3 cannulated screws traversing femoral neck in an inverted triangle configuration, stabilizing valgus impacted fracture.  No subchondral penetration.  IMPLANTS: Implant Name Type Inv. Item Serial No. Manufacturer Lot No. LRB No. Used Action  6.5 mm cannulated screw Screw   Synthes  Right 1 Implanted  6.5 mm cannulated screw Screw   Synthes  Right 1 Implanted  6.5 mm cannulated screw Screw   Synthes  Right 1 Implanted  WASHER FOR 6.5 CANN SCREW - [id number] Washer WASHER FOR 6.5 CANN SCREW  [employer]  Right 2 Implanted    INDICATIONS:  The patient is a 81 y.o. female who injured her right hip as a result of a  mechanical fall.  She presented to the emergency room where she was found to have a right valgus impacted femoral neck fracture.  Prior to the injury, she was living independently and ambulatory without assistive devices.  She was not able to ambulate or bear weight on the right lower extremity following the injury.  The possibility of nonoperative treatment with a period of restricted weightbearing for 6 weeks was discussed with the patient; however, she opted to proceed with surgery in hopes of expediting her recovery and restoring her previous level of function.  She understood the risks, benefits and alternatives to surgery which include but are not limited to bleeding, wound healing complications, infection, damage to surrounding structures, persistent pain, stiffness, lack of improvement, potential for subsequent arthritis or worsening of pre-existing arthritis, nonunion, malunion, and need for further surgery, as well as complications related to anesthesia, cardiovascular complications, and death.  She also understood the potential for continued pain, and that there were no guarantees of acceptable outcome.  After weighing these risks the patient opted to proceed with surgery.  TECHNIQUE: Patient was identified in the preoperative holding area.  The right hip was marked by myself.  Consent was signed by myself and the patient.   FI block was performed by anesthesia in the preoperative holding area.  Patient was taken to the operative suite and placed supine on the operative table.  Anesthesia was induced by the anesthesia team.  The patient was positioned appropriately for the procedure and all bony prominences were well padded.  A tourniquet was not used.  Preoperative antibiotics were given. The extremity was prepped and draped in the usual sterile fashion and surgical timeout was performed.  Fluoroscopic examination was performed in order to confirm that there had been no interval displacement, and the  fracture was amenable to cannulated screw fixation.  No interval displacement was noted.  Location of the femoral neck was identified fluoroscopically and marked out on the skin in order to plan a lateral incision.  A lateral incision was marked out in the trajectory of the screws.  Skin was incised sharply.  Underlying fat subcutaneous tissues were dissected with Bovie electrocautery down onto the fascial layer.  Fascia was sharply in line with fibers, and the lateral femoral cortex was exposed.  A guidewire was positioned on the lateral femoral cortex, proximal to the inferior aspect of the lesser trochanter, and the appropriate trajectory was determined on orthogonal AP and lateral fluoroscopic images.  This initial trajectory was inferior in the neck along the calcar, and in the center of the neck on the lateral view.  Guidewire was advanced proximally into the femoral head, avoiding subchondral penetration.  Using the parallel barrel guide, 2 proximal guidewires were placed through the superior aspect of the femoral neck, again taking care to avoid subchondral penetration.  Screw measurements were determined, and a countersink was used for the distal screw.  The inferior screw was placed first over the guidewire, achieving excellent purchase, without any subchondral penetration.  The superior 2 screws were placed with washers, achieving excellent purchase and avoiding subchondral penetration.  Guidewires were withdrawn, and final fluoroscopic examination was performed to confirm appropriate screw placement without any evidence of subchondral penetration.  The wound was copiously irrigated and hemostasis was obtained.  1 g of vancomycin powder was placed deeply and at all layers throughout the incision.  Fascial layer was closed with interrupted #1 PDS figure-of-eight stitches, followed by multiple simple inverted interrupted 0 Vicryl's closing down the dead space throughout the deeper fat layers, followed by  simple inverted interrupted 2-0 Monocryl deep dermal, followed by running 3 Monocryl subcuticular.  Skin was sealed with Dermabond and tails of the running suture were secured with Steri-Strips.  Aquacel dressing was placed over the incision.  Patient was awakened from anesthesia and transferred to PACU in stable condition.  She tolerated procedure well.  There were no complications.  POST OPERATIVE INSTRUCTIONS: Mobility: Out of bed with PT/OT Pain control: Continue to wean/titrate to appropriate oral regimen DVT Prophylaxis: Lovenox x6 weeks postoperatively Further surgical plans: None RUE: Weightbearing as tolerated, no restrictions LUE: Weightbearing as tolerated, no restrictions RLE: Weightbearing as tolerated, no restrictions LLE: Weightbearing as tolerated, no restrictions Disposition: Per primary team, as medically appropriate Dressing care: Keep AQUACEL on and dry for up to 14 days.  Do not allow surgical area to get wet before that.  Remove AQUACEL dressing after 14 days and allow area to get wet in shower but DO NOT SUBMERGE until wound is evaluated in clinic.  In most cases skin glue is used and no additional dressing is necessary.  Follow-up: Please call [facility] [phone]) to schedule follow-op appointment for 2 weeks after surgery.  TOURNIQUET TIME: * No tourniquets in log *  BLOOD LOSS: 50 mL         DRAINS: none         SPECIMEN: none       COMPLICATIONS:  * No complications entered in OR log *         DISPOSITION: PACU - hemodynamically stable.         CONDITION: stable   Georgeanna Harrison M.D. Orthopaedic Surgery [facility]   Portions of the record have been created with voice recognition  software.  Grammatical and punctuation errors, random word insertions, wrong-word or "sound-a-like" substitutions, pronoun errors (inaccuracies and/or substitutions), and/or incomplete sentences may have occurred due  to the inherent limitations of voice recognition software.  Not all errors are caught or corrected.  Although every attempt is made to root out erroneous and incomplete transcription, the note may still not fully represent the intent or opinion of the author.  Read the chart carefully and recognize, using context, where errors/substitutions have occurred.  Any questions or concerns about the content of this note or information contained within the body of this dictation should be addressed directly with the author for clarification.

## 2022-02-02 NOTE — Anesthesia Procedure Notes (Signed)
Procedure Name: Intubation Date/Time: 02/02/2022 7:44 AM Performed by: Wilburn Cornelia, CRNA Pre-anesthesia Checklist: Patient identified, Emergency Drugs available, Suction available, Patient being monitored and Timeout performed Patient Re-evaluated:Patient Re-evaluated prior to induction Oxygen Delivery Method: Circle system utilized Preoxygenation: Pre-oxygenation with 100% oxygen Induction Type: IV induction Ventilation: Mask ventilation without difficulty Laryngoscope Size: Mac and 3 Grade View: Grade I Tube type: Oral Tube size: 7.0 mm Number of attempts: 1 Airway Equipment and Method: Stylet Placement Confirmation: ETT inserted through vocal cords under direct vision, positive ETCO2, CO2 detector and breath sounds checked- equal and bilateral Secured at: 20 cm Tube secured with: Tape Dental Injury: Teeth and Oropharynx as per pre-operative assessment

## 2022-02-02 NOTE — Transfer of Care (Signed)
Immediate Anesthesia Transfer of Care Note  Patient: [name]  Procedure(s) Performed: CANNULATED HIP PINNING (Right: Hip)  Patient Location: PACU  Anesthesia Type:General  Level of Consciousness: drowsy  Airway & Oxygen Therapy: Patient Spontanous Breathing and Patient connected to face mask oxygen  Post-op Assessment: Report given to RN and Post -op Vital signs reviewed and stable  Post vital signs: Reviewed and stable  Last Vitals:  Vitals Value Taken Time  BP 125/60 02/02/22 0928  Temp    Pulse 73 02/02/22 0931  Resp 10 02/02/22 0931  SpO2 96 % 02/02/22 0931  Vitals shown include unvalidated device data.  Last Pain:  Vitals:   02/02/22 0635  TempSrc: Oral  PainSc:          Complications: No notable events documented.

## 2022-02-02 NOTE — Anesthesia Postprocedure Evaluation (Signed)
Anesthesia Post Note  Patient: [name]  Procedure(s) Performed: CANNULATED HIP PINNING (Right: Hip)     Patient location during evaluation: PACU Anesthesia Type: General and Regional Level of consciousness: awake and alert Pain management: pain level controlled Vital Signs Assessment: post-procedure vital signs reviewed and stable Respiratory status: spontaneous breathing, nonlabored ventilation, respiratory function stable and patient connected to nasal cannula oxygen Cardiovascular status: blood pressure returned to baseline and stable Postop Assessment: no apparent nausea or vomiting Anesthetic complications: no   No notable events documented.  Last Vitals:  Vitals:   02/02/22 1115 02/02/22 1145  BP: (!) 152/77 (!) 152/75  Pulse: 68 68  Resp: 18 14  Temp:    SpO2: 96% 99%    Last Pain:  Vitals:   02/02/22 1145  TempSrc:   PainSc: 0-No pain                 Kobe Ofallon,W. EDMOND

## 2022-02-02 NOTE — Progress Notes (Signed)
Tick removed from right lower abdomen and placed in specimen cup. To stay with pt

## 2022-02-03 ENCOUNTER — Encounter (HOSPITAL_COMMUNITY): Payer: Self-pay | Admitting: Orthopedic Surgery

## 2022-02-03 DIAGNOSIS — Z8781 Personal history of (healed) traumatic fracture: Secondary | ICD-10-CM

## 2022-02-03 DIAGNOSIS — Z7189 Other specified counseling: Secondary | ICD-10-CM

## 2022-02-03 DIAGNOSIS — I10 Essential (primary) hypertension: Secondary | ICD-10-CM | POA: Diagnosis not present

## 2022-02-03 DIAGNOSIS — K219 Gastro-esophageal reflux disease without esophagitis: Secondary | ICD-10-CM | POA: Diagnosis not present

## 2022-02-03 DIAGNOSIS — S728X1D Other fracture of right femur, subsequent encounter for closed fracture with routine healing: Secondary | ICD-10-CM | POA: Diagnosis not present

## 2022-02-03 DIAGNOSIS — M19011 Primary osteoarthritis, right shoulder: Secondary | ICD-10-CM

## 2022-02-03 LAB — URINALYSIS, ROUTINE W REFLEX MICROSCOPIC
Bilirubin Urine: NEGATIVE
Glucose, UA: NEGATIVE mg/dL
Ketones, ur: NEGATIVE mg/dL
Nitrite: NEGATIVE
Protein, ur: NEGATIVE mg/dL
Specific Gravity, Urine: 1.011 (ref 1.005–1.030)
WBC, UA: >50 WBC/hpf — ABNORMAL HIGH (ref 0–5)
pH: 6 (ref 5.0–8.0)

## 2022-02-03 MED ORDER — HYDROCODONE-ACETAMINOPHEN 5-325 MG PO TABS
1.0000 | ORAL_TABLET | ORAL | 0 refills | Status: DC | PRN
Start: 2022-02-03 — End: 2022-02-18

## 2022-02-03 MED ORDER — HYDROCODONE-ACETAMINOPHEN 5-325 MG PO TABS
1.0000 | ORAL_TABLET | ORAL | Status: DC | PRN
  Administered 2022-02-03 – 2022-02-04 (×4): 1 via ORAL
  Filled 2022-02-03 (×3): qty 1
  Filled 2022-02-03: qty 2

## 2022-02-03 MED ORDER — ENOXAPARIN SODIUM 40 MG/0.4ML IJ SOSY: 40.0000 mg | PREFILLED_SYRINGE | INTRAMUSCULAR | 0 refills | Status: DC

## 2022-02-03 MED ORDER — ALUM & MAG HYDROXIDE-SIMETH 200-200-20 MG/5ML PO SUSP
30.0000 mL | ORAL | Status: DC | PRN
Start: 2022-02-03 — End: 2022-02-04
  Administered 2022-02-03 (×2): 30 mL via ORAL
  Filled 2022-02-03 (×3): qty 30

## 2022-02-03 NOTE — Progress Notes (Signed)
Inpatient Rehab Admissions Coordinator Note:   Per therapy recommendations patient was screened for CIR candidacy by Michel Santee, PT. At this time, pt appears to be a potential candidate for CIR. I will place an order for rehab consult for full assessment, per our protocol.  Please contact me any with questions.Shann Medal, PT, DPT [phone] 02/03/22 10:02 AM

## 2022-02-03 NOTE — Consult Note (Signed)
Consultation Note Date: 02/03/2022   Patient Name: [name]  DOB: [birth year]  MRN: [id number]  Age / Sex: 81 y.o., female  PCP: Pam Squibb, MD Referring Physician: Hosie Poisson, MD  Reason for Consultation: Establishing goals of care  HPI/Patient Profile: 81 y.o. female  with past medical history of hypertension, hypothyroidism, osteopenia, anxiety/depression, GERD admitted on 02/01/2022 with fall onto right side.   right subcapital femoral fracture. PMT has been consulted to assist with goals of care conversation.  Clinical Assessment and Goals of Care:  I have reviewed medical records including EPIC notes, labs and imaging, assessed the patient and then met at the bedside with son, daughter-in-law, and husband to discuss diagnosis prognosis, GOC, EOL wishes, disposition and options.  I introduced Palliative Medicine as specialized medical care for people living with serious illness. It focuses on providing relief from the symptoms and stress of a serious illness. The goal is to improve quality of life for both the patient and the family.  We discussed a brief life review of the patient and then focused on their current illness.   I attempted to elicit values and goals of care important to the patient.    Medical History Review and Understanding:  Discussed risks of complications from recurrent falls, fractures, decreased functionality.    Social History: Patient and her husband have been married for nearly 59 years. They are well supported by son [name] and daughter-in-law [name]. She does not have a drivers license.   Functional and Nutritional State: Patient was functioning independently but with a history of recurrent falls and worsening memory. Patient has confused her DIL for son's first wife, not remembering day of the week, and that her son was younger.  Palliative Symptoms: Pain,  anxiety  Advance Directives: A detailed discussion regarding advanced directives was had.  She may have HCPOA and living will at home but husband is uncertain if this is up-to-date.   Code Status: Concepts specific to code status, artifical feeding and hydration, and rehospitalization were considered and discussed.  Patient's husband reports they have DNR orders at home.  Discussion: Patient initially tells me she has everything she needs as far as resources and support.  She is agreeable to further conversation later this afternoon when her husband arrives.  Stepped out with daughter-in-law [name] privately, who shares family's concern over the past few months regarding patient's memory problems and worsening falls.  It is getting more difficult for her husband to care for the patient and they may not realize this has high potential to get worse with ongoing decline.  When I returned to the bedside, I met with family and encouraged [name] to share her thoughts/feelings and priorities.  We discussed potential for hip fracture, surgery, hospitalization and delirium to speed up progression of previously unknown cognitive disorders.  Husband notes he has been concerned and was surprised that she was cleared during a primary care appointment last year.  Patient wishes to be viewed as "having all her marbles" and that her family knows no matter what that she loves them. I counseled patient on the importance of early intervention to prevent any further progression if in fact she has an underlying problem.  Counseled on the importance of adding additional support and planning for this case and worst-case scenarios. She has a lot of generalized anxiety and likes to focus on the positives.  Palliative Care services outpatient were explained and offered.   Discussed the importance of continued conversation with family  and the medical providers regarding overall plan of care and treatment options, ensuring  decisions are within the context of the patient's values and GOCs.   Questions and concerns were addressed.  Hard Choices booklet left for review. The family was encouraged to call with questions or concerns.  PMT will continue to support holistically.   SUMMARY OF RECOMMENDATIONS   -Change code status to DNR -Goal is to work on rehabilitation at [facility] and hopefully return to previous baseline of functioning independently -Family is interested in neurology evaluation for possible dementia, will discuss with attending regarding possible inpatient consult at family's request -If unable to assess inpatient, family requests referral to outpatient neurology -Outpatient palliative care referral at discharge -Patient's husband will bring in AD documentation -Introduced MOST form today -Psychosocial and emotional support provided -PMT will continue to follow  Prognosis:  Unable to determine  Discharge Planning:  CIR       Primary Diagnoses: Present on Admission:  Femoral fracture ([place])  Hyperlipidemia  Essential hypertension  GERD (gastroesophageal reflux disease)  Obesity (BMI 30-39.9)  Hypothyroidism, postop  Physical Exam Vitals and nursing note reviewed.  Constitutional:      General: She is not in acute distress.    Appearance: She is overweight.     Interventions: Nasal cannula in place.  Cardiovascular:     Rate and Rhythm: Normal rate.  Pulmonary:     Effort: Pulmonary effort is normal.  Skin:    General: Skin is warm and dry.  Neurological:     Mental Status: She is alert. She is confused.  Psychiatric:        Behavior: Behavior is cooperative.    Vital Signs: BP (!) 154/79 (BP Location: Left Arm)   Pulse 86   Temp 97.7 F (36.5 C)   Resp 18   Ht 5' 4.49" (1.638 m)   Wt 75.8 kg   SpO2 95%   BMI 28.23 kg/m  Pain Scale: 0-10 POSS *See Group Information*: 1-Acceptable,Awake and alert Pain Score: 5    SpO2: SpO2: 95 % O2 Device:SpO2: 95 % O2 Flow Rate:  .O2 Flow Rate (L/min): 1 L/min   Palliative Assessment/Data:     MDM: High  [name] Pam Litter, PA-C  Palliative Medicine Team Team phone # [phone]  Thank you for allowing the Palliative Medicine Team to assist in the care of this patient. Please utilize secure chat with additional questions, if there is no response within 30 minutes please call the above phone number.  Palliative Medicine Team providers are available by phone from 7am to 7pm daily and can be reached through the team cell phone.  Should this patient require assistance outside of these hours, please call the patient's attending physician.

## 2022-02-03 NOTE — Progress Notes (Signed)
[facility]                                                                               [name], is a 81 y.o. female, DOB - 19-Jul-1941, SEG:[id number] Admit date - 02/01/2022    Outpatient Primary MD for the patient is [name], Pam Areola, MD  LOS - 2  days    Brief summary   [name] is a 81 y.o. female with medical history significant of hypertension, hypothyroidism, osteopenia, anxiety/depression, GERD.  Patient seen after a mechanical fall .The patient who stepping into garage and missed step, falling on her right side.  She had pain and difficulty walking.  She was brought to the hospital by EMS.  X-rays show a right subcapital femoral fracture. Orthopedics consulted and underwent ORIF.     Assessment & Plan    Assessment and Plan:  Right sub capital femoral fracture:  S/p cannulated hip pinning by orthopedics on 5/21 Pain control.  Physical therapy eval recommending CIR.  Lovenox for 6 weeks for dvt prophylaxis.  Weight bearing as tolerated , no restrictions.  Follow up with [facility] in 2 weeks.    Hypertension;  BP parameters are optimal.  Continue with losartan, norvasc, .    Hypothyroidism:  Continue with synthroid.    Anxiety and depression:  Resume wellbutrin XL.    GERD Stable.    Leukocytosis:  Waiting for UA results.  Get UA. Repeat CBC in am.     Estimated body mass index is 28.23 kg/m as calculated from the following:   Height as of this encounter: 5' 4.49" (1.638 m).   Weight as of this encounter: 75.8 kg.  Code Status: full code.  DVT Prophylaxis:  enoxaparin (LOVENOX) injection 40 mg Start: 02/03/22 1000 SCDs Start: 02/02/22 1601 SCDs Start: 02/01/22 2109   Level of Care: Level of care: Med-Surg Family Communication: none at bedside.   Disposition Plan:    pending.   Procedures:  Open treatment of right valgus impacted subcapital femoral neck fracture, with internal fixation  Operative use  of fluoroscopy for above procedure  Consultants:   Orthopedics.   Antimicrobials:   Anti-infectives (From admission, onward)    Start     Dose/Rate Route Frequency Ordered Stop   02/02/22 1530  ceFAZolin (ANCEF) IVPB 2g/100 mL premix        2 g 200 mL/hr over 30 Minutes Intravenous Every 6 hours 02/02/22 1201 02/02/22 2153   02/02/22 0852  vancomycin (VANCOCIN) powder  Status:  Discontinued          As needed 02/02/22 0853 02/02/22 0924   02/02/22 0600  ceFAZolin (ANCEF) IVPB 2g/100 mL premix        2 g 200 mL/hr over 30 Minutes Intravenous On call to O.R. 02/01/22 2105 02/02/22 0752        Medications  Scheduled Meds:  amLODipine  10 mg Oral Daily   amphetamine-dextroamphetamine  30 mg Oral BID WC   buPROPion  300 mg Oral Daily   docusate sodium  100 mg Oral BID   enoxaparin (LOVENOX) injection  40 mg Subcutaneous Q24H   levothyroxine  50 mcg Oral Q0600  losartan  100 mg Oral Daily   polyvinyl alcohol  2 drop Both Eyes BID   senna  1 tablet Oral BID   Continuous Infusions:  sodium chloride 75 mL/hr at 02/02/22 2123   PRN Meds:.HYDROcodone-acetaminophen, menthol-cetylpyridinium **OR** phenol, morphine injection, ondansetron **OR** ondansetron (ZOFRAN) IV, ondansetron **OR** ondansetron (ZOFRAN) IV    Subjective:   [name] was seen and examined today.  Pain controlled.   Objective:   Vitals:   02/02/22 1514 02/02/22 2006 02/03/22 0427 02/03/22 0842  BP: (!) 169/85 (!) 173/85 (!) 158/83 (!) 135/58  Pulse: 62 (!) 46 64 74  Resp:  '19 19 18  '$ Temp:  97.8 F (36.6 C) 98.9 F (37.2 C) 97.8 F (36.6 C)  TempSrc:  Oral Oral Oral  SpO2: 99% (!) 73% 99% 97%  Weight:      Height:        Intake/Output Summary (Last 24 hours) at 02/03/2022 0957 Last data filed at 02/03/2022 0537 Gross per 24 hour  Intake 2280 ml  Output --  Net 2280 ml    Filed Weights   02/01/22 1512 02/02/22 0646  Weight: 75.8 kg 75.8 kg     Exam General exam: Appears calm and  comfortable  Respiratory system: Clear to auscultation. Respiratory effort normal. Cardiovascular system: S1 & S2 heard, RRR. No JVD,No pedal edema. Gastrointestinal system: Abdomen is nondistended, soft and nontender. Normal bowel sounds heard. Central nervous system: Alert and oriented. No focal neurological deficits. Extremities: painful ROM of the right LE. Skin: No rashes, lesions or ulcers Psychiatry:  Mood & affect appropriate.    Data Reviewed:  I have personally reviewed following labs and imaging studies   CBC Lab Results  Component Value Date   WBC 15.7 (H) 02/02/2022   RBC 4.70 02/02/2022   HGB 13.2 02/02/2022   HCT 42.4 02/02/2022   MCV 90.2 02/02/2022   MCH 28.1 02/02/2022   PLT 292 02/02/2022   MCHC 31.1 02/02/2022   RDW 16.0 (H) 02/02/2022   LYMPHSABS 3.1 02/01/2022   MONOABS 0.8 02/01/2022   EOSABS 0.3 02/01/2022   BASOSABS 0.1 84/69/6295     Last metabolic panel Lab Results  Component Value Date   NA 140 02/01/2022   K 3.6 02/01/2022   CL 105 02/01/2022   CO2 27 02/01/2022   BUN 18 02/01/2022   CREATININE 1.02 (H) 02/02/2022   GLUCOSE 98 02/01/2022   GFRNONAA 55 (L) 02/02/2022   GFRAA >60 09/02/2018   CALCIUM 9.6 02/01/2022   PROT 6.6 05/23/2016   ALBUMIN 3.9 05/23/2016   BILITOT 0.5 05/23/2016   ALKPHOS 74 05/23/2016   AST 32 05/23/2016   ALT 38 (H) 05/23/2016   ANIONGAP 8 02/01/2022    CBG (last 3)  No results for input(s): GLUCAP in the last 72 hours.    Coagulation Profile: Recent Labs  Lab 02/01/22 1733  INR 1.0      Radiology Studies: DG Shoulder Right  Result Date: 02/01/2022 CLINICAL DATA:  Pain after fall EXAM: RIGHT SHOULDER - 2+ VIEW COMPARISON:  Chest x-ray September 13, 2020 FINDINGS: The right humerus is high riding consistent with rotator cuff pathology/tear. Irregularity of the radial head is favored to be chronic. No convincing evidence of acute fracture. Glenohumeral degenerative changes are identified. AC joint  degenerative changes are identified. No other acute abnormalities. No dislocation. IMPRESSION: 1. High riding right humerus consistent with rotator cuff pathology/tear. 2. Irregularity and flattening of the humeral head is favored to be nonacute. No convincing  evidence of acute fracture. No dislocation. 3. Severe glenohumeral degenerative changes with significant loss of joint space. Electronically Signed   By: Dorise Bullion III M.D.   On: 02/01/2022 16:31   DG Elbow Complete Right  Result Date: 02/01/2022 CLINICAL DATA:  Pain after fall EXAM: RIGHT ELBOW - COMPLETE 3+ VIEW COMPARISON:  None Available. FINDINGS: There is no evidence of fracture, dislocation, or joint effusion. There is no evidence of arthropathy or other focal bone abnormality. Soft tissues are unremarkable. IMPRESSION: Negative. Electronically Signed   By: Dorise Bullion III M.D.   On: 02/01/2022 16:34   DG C-Arm 1-60 Min-No Report  Result Date: 02/02/2022 Fluoroscopy was utilized by the requesting physician.  No radiographic interpretation.   DG HIP UNILAT WITH PELVIS 2-3 VIEWS RIGHT  Result Date: 02/02/2022 CLINICAL DATA:  ORIF RIGHT hip fracture. EXAM: DG HIP (WITH OR WITHOUT PELVIS) 3V RIGHT COMPARISON:  02/01/2022 radiographs FINDINGS: Intraoperative spot views of the RIGHT hip are submitted postoperatively for interpretation. 3 surgical screws are noted traversing the RIGHT femoral neck fracture which appears in near anatomic alignment and position. No gross complicating features are noted. IMPRESSION: Internal fixation of RIGHT femoral neck fracture. Electronically Signed   By: Margarette Canada M.D.   On: 02/02/2022 10:17   DG Hip Unilat With Pelvis 2-3 Views Right  Result Date: 02/01/2022 CLINICAL DATA:  Pain after fall EXAM: DG HIP (WITH OR WITHOUT PELVIS) 2-3V RIGHT COMPARISON:  None Available. FINDINGS: The patient is status post left hip replacement. Visualized portions of the left hip hardware are in good position. The  pelvic bones are intact. There is a mildly impacted subcapital fracture in the right hip. No dislocation. No other acute abnormalities. IMPRESSION: Mildly impacted subcapital right hip fracture. Electronically Signed   By: Dorise Bullion III M.D.   On: 02/01/2022 16:32   DG Knee AP/LAT W/[place] Left  Result Date: 02/02/2022 CLINICAL DATA:  Pain during hip procedure. EXAM: LEFT KNEE 3 VIEWS COMPARISON:  None Available. FINDINGS: Degenerative changes in the lateral compartment with mild loss of joint space. No fractures, dislocations, or effusions, or other cause for pain identified. IMPRESSION: Lateral compartment degenerative changes. Electronically Signed   By: Dorise Bullion III M.D.   On: 02/02/2022 13:19       Hosie Poisson M.D. [facility] 02/03/2022, 9:57 AM  Available via Epic secure chat 7am-7pm After 7 pm, please refer to night coverage provider listed on amion.

## 2022-02-03 NOTE — Evaluation (Signed)
Physical Therapy Evaluation Patient Details Name: [name] MRN: [id number] DOB: [birth year] Today's Date: 02/03/2022  History of Present Illness  Pt is a 81 y.o. F who presents with a right valgus impacted subcapital femoral neck fracture now s/p cannulated hip pinning. Imaging right shoulder showing High riding right humerus consistent with rotator cuff pathology/tear and severe glenohumeral degenerative changes. Significant PMH: HTN, osteopenia, anxiety/depression, GERD.  Clinical Impression  PTA, pt lives with her husband and is independent; enjoys gardening. Pt presents with RLE weakness, decreased ROM, right hip and shoulder pain, and impaired balance. Initiated bed level warm up exercises for right lower extremity ROM and strengthening. Pt requiring moderate assist for bed mobility. Unfortunately, was unable to stand from edge of bed using a walker or ambulation equipment Denna Haggard). Will need +2 assist for out of bed mobility. Suspect good progress given PLOF and motivation. Recommend AIR to address deficits, maximize functional mobility and decrease caregiver burden.     Recommendations for follow up therapy are one component of a multi-disciplinary discharge planning process, led by the attending physician.  Recommendations may be updated based on patient status, additional functional criteria and insurance authorization.  Follow Up Recommendations Acute inpatient rehab (3hours/day)    Assistance Recommended at Discharge Frequent or constant Supervision/Assistance  Patient can return home with the following  Two people to help with walking and/or transfers;A lot of help with bathing/dressing/bathroom    Equipment Recommendations Rolling walker (2 wheels);BSC/3in1;Wheelchair (measurements PT);Wheelchair cushion (measurements PT)  Recommendations for Other Services  Rehab consult    Functional Status Assessment Patient has had a recent decline in their functional status and  demonstrates the ability to make significant improvements in function in a reasonable and predictable amount of time.     Precautions / Restrictions Precautions Precautions: Fall Restrictions Weight Bearing Restrictions: Yes RLE Weight Bearing: Weight bearing as tolerated      Mobility  Bed Mobility Overal bed mobility: Needs Assistance Bed Mobility: Supine to Sit, Sit to Supine     Supine to sit: Mod assist Sit to supine: Mod assist   General bed mobility comments: Heavy modA to progress to edge of bed, assist for RLE management and trunk to upright. Verbal cues for sequencing and initiation. Assist for BLE's back into bed    Transfers Overall transfer level: Needs assistance Equipment used: Rolling walker (2 wheels), Ambulation equipment used Transfers: Sit to/from Stand Sit to Stand:  (unable)           General transfer comment: Pt unable to stand with use of RW or Stedy, decreased ability to weight shift and weight bear through either LE    Ambulation/Gait                  Stairs            Wheelchair Mobility    Modified Rankin (Stroke Patients Only)       Balance Overall balance assessment: Needs assistance Sitting-balance support: Feet supported Sitting balance-Leahy Scale: Fair                                       Pertinent Vitals/Pain Pain Assessment Pain Assessment: Faces Faces Pain Scale: Hurts whole lot Pain Location: R hip, shoulder Pain Descriptors / Indicators: Other (Comment), Operative site guarding, Grimacing, Guarding (stinging) Pain Intervention(s): Limited activity within patient's tolerance, Monitored during session, Premedicated before session    Home  Living Family/patient expects to be discharged to:: Private residence Living Arrangements: Spouse/significant other Available Help at Discharge: Family Type of Home: House Home Access: Stairs to enter   Technical brewer of Steps: 1 (through  garage)   Home Layout: One Bryce: Kasandra Knudsen - single point      Prior Function Prior Level of Function : Independent/Modified Independent                     Hand Dominance        Extremity/Trunk Assessment   Upper Extremity Assessment Upper Extremity Assessment: Defer to OT evaluation    Lower Extremity Assessment Lower Extremity Assessment: Generalized weakness;LLE deficits/detail LLE Deficits / Details: Grossly 1/5 except ankle dorsiflexion WFL       Communication   Communication: No difficulties  Cognition Arousal/Alertness: Awake/alert Behavior During Therapy: WFL for tasks assessed/performed Overall Cognitive Status: Impaired/Different from baseline Area of Impairment: Memory, Problem solving                     Memory: Decreased short-term memory       Problem Solving: Requires verbal cues General Comments: STM deficits noted        General Comments      Exercises General Exercises - Lower Extremity Ankle Circles/Pumps: Both, 20 reps, Supine Quad Sets: Right, 10 reps, Supine Heel Slides: AAROM, Right, 5 reps, Supine Hip ABduction/ADduction: AAROM, Right, 5 reps, Supine   Assessment/Plan    PT Assessment Patient needs continued PT services  PT Problem List Decreased strength;Decreased activity tolerance;Decreased range of motion;Decreased mobility;Decreased balance;Decreased cognition;Pain       PT Treatment Interventions DME instruction;Gait training;Stair training;Functional mobility training;Therapeutic activities;Therapeutic exercise;Balance training;Patient/family education    PT Goals (Current goals can be found in the Care Plan section)  Acute Rehab PT Goals Patient Stated Goal: less pain PT Goal Formulation: With patient/family Time For Goal Achievement: 02/17/22 Potential to Achieve Goals: Good    Frequency Min 5X/week     Co-evaluation               AM-PAC PT "6 Clicks" Mobility  Outcome  Measure Help needed turning from your back to your side while in a flat bed without using bedrails?: A Lot Help needed moving from lying on your back to sitting on the side of a flat bed without using bedrails?: A Lot Help needed moving to and from a bed to a chair (including a wheelchair)?: Total Help needed standing up from a chair using your arms (e.g., wheelchair or bedside chair)?: Total Help needed to walk in hospital room?: Total Help needed climbing 3-5 steps with a railing? : Total 6 Click Score: 8    End of Session   Activity Tolerance: Patient limited by pain Patient left: in bed;with call bell/phone within reach;with family/visitor present Nurse Communication: Mobility status PT Visit Diagnosis: Pain;Difficulty in walking, not elsewhere classified (R26.2);Other abnormalities of gait and mobility (R26.89) Pain - Right/Left: Right Pain - part of body: Hip;Shoulder    Time: 0730-0805 PT Time Calculation (min) (ACUTE ONLY): 35 min   Charges:   PT Evaluation $PT Eval Moderate Complexity: 1 Mod PT Treatments $Therapeutic Activity: 8-22 mins        Wyona Almas, PT, DPT Acute Rehabilitation Services Pager [phone] Office [phone]   Deno Etienne 02/03/2022, 8:55 AM

## 2022-02-03 NOTE — Progress Notes (Addendum)
Inpatient Rehab Admissions Coordinator:   Met with patient and her daughter in law at the bedside to discuss CIR recommendations and goals/expectations of CIR stay.  We reviewed 3 hrs/day of therapy, physician follow up, and average length of stay about 2 weeks (dependent on progress).  We reviewed goals of supervision to mod I; she is home with her spouse who can provide supervision if needed (daughter in law confirms).  We reviewed Medicare benefits.  I will follow for potential admit pending bed availability and pain control.   Shann Medal, PT, DPT Admissions Coordinator [phone] 02/03/22  3:03 PM

## 2022-02-03 NOTE — Plan of Care (Signed)

## 2022-02-03 NOTE — TOC CAGE-AID Note (Signed)
Transition of Care [facility]) - CAGE-AID Screening   Patient Details  Name: [name] MRN: [id number] Date of Birth: 1940/10/21  Transition of Care [facility]) CM/SW Contact:    Dmani Mizer C Tarpley-Carter, [place] Phone Number: 02/03/2022, 2:41 PM   Clinical Narrative: Pt participated in [place].  Pt stated she does not use substance or ETOH.  Pt was not offered resources, due to no usage of substance or ETOH.     [name], MSW, LCSW-A Pronouns:  She/Her/Hers [facility] Clinical Social Worker Direct Number:  [phone] [email or web address]  CAGE-AID Screening:    Have You Ever Felt You Ought to Cut Down on Your Drinking or Drug Use?: No Have People Annoyed You By [employer] Your Drinking Or Drug Use?: No Have You Felt Bad Or Guilty About Your Drinking Or Drug Use?: No Have You Ever Had a Drink or Used Drugs First Thing In The Morning to Steady Your Nerves or to Get Rid of a Hangover?: No CAGE-AID Score: 0  Substance Abuse Education Offered: No

## 2022-02-03 NOTE — Progress Notes (Signed)
Orthopaedics Daily Progress Note   02/03/2022   7:55 AM  [name] is a 81 y.o. female 1 Day Post-Op s/p CANNULATED HIP PINNING  Subjective Pain controlled.  Denies nausea, vomiting, or fevers.   Objective Vitals:   02/02/22 2006 02/03/22 0427  BP: (!) 173/85 (!) 158/83  Pulse: (!) 46 64  Resp: 19 19  Temp: 97.8 F (36.6 C) 98.9 F (37.2 C)  SpO2: (!) 73% 99%    Intake/Output Summary (Last 24 hours) at 02/03/2022 0755 Last data filed at 02/03/2022 2130 Gross per 24 hour  Intake 2480 ml  Output 50 ml  Net 2430 ml    Physical Exam RLE: Dressing clean, dry, and intact +DF/PF/EHL SILT SP/DP/T +DP/PT and WWP distally  Assessment 81 y.o. female s/p Procedure(s) (LRB): CANNULATED HIP PINNING (Right)  Plan Mobility: Out of bed with PT/OT Pain control: Continue to wean/titrate to appropriate oral regimen DVT Prophylaxis: Lovenox x6 weeks postoperatively Further surgical plans: None RUE: Weightbearing as tolerated, no restrictions LUE: Weightbearing as tolerated, no restrictions RLE: Weightbearing as tolerated, no restrictions LLE: Weightbearing as tolerated, no restrictions Disposition: Per primary team, as medically appropriate Dressing care: Keep AQUACEL on and dry for up to 14 days.  Do not allow surgical area to get wet before that.  Remove AQUACEL dressing after 14 days and allow area to get wet in shower but DO NOT SUBMERGE until wound is evaluated in clinic.  In most cases skin glue is used and no additional dressing is necessary.  Follow-up: Please call [place] [phone]) to schedule follow-op appointment for 2 weeks after surgery.   Georgeanna Harrison M.D. Orthopaedic Surgery [facility]

## 2022-02-04 ENCOUNTER — Inpatient Hospital Stay (HOSPITAL_COMMUNITY): Payer: Medicare Other

## 2022-02-04 ENCOUNTER — Inpatient Hospital Stay (HOSPITAL_COMMUNITY)
Admission: RE | Admit: 2022-02-04 | Discharge: 2022-02-19 | DRG: 561 | Disposition: A | Discharge status: Home Health | Payer: Medicare Other | Source: Intra-hospital | Attending: Physical Medicine and Rehabilitation | Admitting: Physical Medicine and Rehabilitation

## 2022-02-04 ENCOUNTER — Other Ambulatory Visit: Payer: Self-pay

## 2022-02-04 ENCOUNTER — Encounter (HOSPITAL_COMMUNITY): Payer: Self-pay | Admitting: Physical Medicine and Rehabilitation

## 2022-02-04 DIAGNOSIS — E039 Hypothyroidism, unspecified: Secondary | ICD-10-CM | POA: Diagnosis present

## 2022-02-04 DIAGNOSIS — M7989 Other specified soft tissue disorders: Secondary | ICD-10-CM | POA: Diagnosis not present

## 2022-02-04 DIAGNOSIS — S728X1D Other fracture of right femur, subsequent encounter for closed fracture with routine healing: Secondary | ICD-10-CM | POA: Diagnosis not present

## 2022-02-04 DIAGNOSIS — K5900 Constipation, unspecified: Secondary | ICD-10-CM | POA: Diagnosis not present

## 2022-02-04 DIAGNOSIS — Z8349 Family history of other endocrine, nutritional and metabolic diseases: Secondary | ICD-10-CM

## 2022-02-04 DIAGNOSIS — M8580 Other specified disorders of bone density and structure, unspecified site: Secondary | ICD-10-CM | POA: Diagnosis present

## 2022-02-04 DIAGNOSIS — Z66 Do not resuscitate: Secondary | ICD-10-CM | POA: Diagnosis present

## 2022-02-04 DIAGNOSIS — Z96649 Presence of unspecified artificial hip joint: Secondary | ICD-10-CM | POA: Diagnosis present

## 2022-02-04 DIAGNOSIS — W010XXD Fall on same level from slipping, tripping and stumbling without subsequent striking against object, subsequent encounter: Secondary | ICD-10-CM | POA: Diagnosis present

## 2022-02-04 DIAGNOSIS — S72011D Unspecified intracapsular fracture of right femur, subsequent encounter for closed fracture with routine healing: Principal | ICD-10-CM

## 2022-02-04 DIAGNOSIS — Z809 Family history of malignant neoplasm, unspecified: Secondary | ICD-10-CM

## 2022-02-04 DIAGNOSIS — Z96612 Presence of left artificial shoulder joint: Secondary | ICD-10-CM | POA: Diagnosis present

## 2022-02-04 DIAGNOSIS — E876 Hypokalemia: Secondary | ICD-10-CM | POA: Diagnosis not present

## 2022-02-04 DIAGNOSIS — R4181 Age-related cognitive decline: Secondary | ICD-10-CM | POA: Diagnosis not present

## 2022-02-04 DIAGNOSIS — S46011D Strain of muscle(s) and tendon(s) of the rotator cuff of right shoulder, subsequent encounter: Secondary | ICD-10-CM | POA: Diagnosis not present

## 2022-02-04 DIAGNOSIS — M1990 Unspecified osteoarthritis, unspecified site: Secondary | ICD-10-CM | POA: Diagnosis present

## 2022-02-04 DIAGNOSIS — Z87891 Personal history of nicotine dependence: Secondary | ICD-10-CM

## 2022-02-04 DIAGNOSIS — Z79890 Hormone replacement therapy (postmenopausal): Secondary | ICD-10-CM | POA: Diagnosis not present

## 2022-02-04 DIAGNOSIS — I10 Essential (primary) hypertension: Secondary | ICD-10-CM | POA: Diagnosis present

## 2022-02-04 DIAGNOSIS — E669 Obesity, unspecified: Secondary | ICD-10-CM | POA: Diagnosis present

## 2022-02-04 DIAGNOSIS — K219 Gastro-esophageal reflux disease without esophagitis: Secondary | ICD-10-CM | POA: Diagnosis present

## 2022-02-04 DIAGNOSIS — S72011A Unspecified intracapsular fracture of right femur, initial encounter for closed fracture: Secondary | ICD-10-CM | POA: Diagnosis not present

## 2022-02-04 DIAGNOSIS — Z6828 Body mass index (BMI) 28.0-28.9, adult: Secondary | ICD-10-CM

## 2022-02-04 DIAGNOSIS — Z8249 Family history of ischemic heart disease and other diseases of the circulatory system: Secondary | ICD-10-CM | POA: Diagnosis not present

## 2022-02-04 DIAGNOSIS — S72011S Unspecified intracapsular fracture of right femur, sequela: Secondary | ICD-10-CM | POA: Diagnosis not present

## 2022-02-04 DIAGNOSIS — Z811 Family history of alcohol abuse and dependence: Secondary | ICD-10-CM | POA: Diagnosis not present

## 2022-02-04 DIAGNOSIS — R52 Pain, unspecified: Secondary | ICD-10-CM | POA: Diagnosis not present

## 2022-02-04 DIAGNOSIS — Z8781 Personal history of (healed) traumatic fracture: Secondary | ICD-10-CM | POA: Diagnosis present

## 2022-02-04 DIAGNOSIS — R630 Anorexia: Secondary | ICD-10-CM | POA: Diagnosis not present

## 2022-02-04 DIAGNOSIS — Z825 Family history of asthma and other chronic lower respiratory diseases: Secondary | ICD-10-CM

## 2022-02-04 DIAGNOSIS — F32A Depression, unspecified: Secondary | ICD-10-CM | POA: Diagnosis present

## 2022-02-04 DIAGNOSIS — Z6372 Alcoholism and drug addiction in family: Secondary | ICD-10-CM

## 2022-02-04 DIAGNOSIS — F418 Other specified anxiety disorders: Secondary | ICD-10-CM

## 2022-02-04 DIAGNOSIS — J45909 Unspecified asthma, uncomplicated: Secondary | ICD-10-CM | POA: Diagnosis present

## 2022-02-04 DIAGNOSIS — G4700 Insomnia, unspecified: Secondary | ICD-10-CM | POA: Diagnosis present

## 2022-02-04 DIAGNOSIS — F419 Anxiety disorder, unspecified: Secondary | ICD-10-CM | POA: Diagnosis present

## 2022-02-04 DIAGNOSIS — Z79899 Other long term (current) drug therapy: Secondary | ICD-10-CM

## 2022-02-04 DIAGNOSIS — Z820 Family history of epilepsy and other diseases of the nervous system: Secondary | ICD-10-CM | POA: Diagnosis not present

## 2022-02-04 DIAGNOSIS — F320 Major depressive disorder, single episode, mild: Secondary | ICD-10-CM | POA: Diagnosis not present

## 2022-02-04 LAB — BASIC METABOLIC PANEL
Anion gap: 4 — ABNORMAL LOW (ref 5–15)
BUN: 9 mg/dL (ref 8–23)
CO2: 27 mmol/L (ref 22–32)
Calcium: 8.4 mg/dL — ABNORMAL LOW (ref 8.9–10.3)
Chloride: 105 mmol/L (ref 98–111)
Creatinine, Ser: 0.88 mg/dL (ref 0.44–1.00)
GFR, Estimated: >60 mL/min (ref >60)
Glucose, Bld: 103 mg/dL — ABNORMAL HIGH (ref 70–99)
Potassium: 3.5 mmol/L (ref 3.5–5.1)
Sodium: 136 mmol/L (ref 135–145)

## 2022-02-04 LAB — CBC WITH DIFFERENTIAL/PLATELET
Abs Immature Granulocytes: 0.06 10*3/uL (ref 0.00–0.07)
Basophils Absolute: 0.1 10*3/uL (ref 0.0–0.1)
Basophils Relative: 1 %
Eosinophils Absolute: 1.3 10*3/uL — ABNORMAL HIGH (ref 0.0–0.5)
Eosinophils Relative: 10 %
HCT: 35.4 % — ABNORMAL LOW (ref 36.0–46.0)
Hemoglobin: 11.1 g/dL — ABNORMAL LOW (ref 12.0–15.0)
Immature Granulocytes: 1 %
Lymphocytes Relative: 19 %
Lymphs Abs: 2.3 10*3/uL (ref 0.7–4.0)
MCH: 28.1 pg (ref 26.0–34.0)
MCHC: 31.4 g/dL (ref 30.0–36.0)
MCV: 89.6 fL (ref 80.0–100.0)
Monocytes Absolute: 1.1 10*3/uL — ABNORMAL HIGH (ref 0.1–1.0)
Monocytes Relative: 9 %
Neutro Abs: 7.4 10*3/uL (ref 1.7–7.7)
Neutrophils Relative %: 60 %
Platelets: 264 10*3/uL (ref 150–400)
RBC: 3.95 MIL/uL (ref 3.87–5.11)
RDW: 15.9 % — ABNORMAL HIGH (ref 11.5–15.5)
WBC: 12.3 10*3/uL — ABNORMAL HIGH (ref 4.0–10.5)
nRBC: 0 % (ref 0.0–0.2)

## 2022-02-04 MED ORDER — SENNA 8.6 MG PO TABS: 1.0000 | ORAL_TABLET | Freq: Two times a day (BID) | ORAL | 0 refills | Status: DC

## 2022-02-04 MED ORDER — SENNA 8.6 MG PO TABS
1.0000 | ORAL_TABLET | Freq: Two times a day (BID) | ORAL | Status: DC
  Administered 2022-02-04 – 2022-02-11 (×14): 8.6 mg via ORAL
  Filled 2022-02-04 (×14): qty 1

## 2022-02-04 MED ORDER — MELATONIN 3 MG PO TABS
3.0000 mg | ORAL_TABLET | Freq: Every day | ORAL | Status: AC
  Administered 2022-02-04: 3 mg via ORAL
  Filled 2022-02-04: qty 1

## 2022-02-04 MED ORDER — AMOXICILLIN-POT CLAVULANATE 875-125 MG PO TABS
1.0000 | ORAL_TABLET | Freq: Two times a day (BID) | ORAL | 0 refills | Status: DC
Start: 2022-02-04 — End: 2022-02-19

## 2022-02-04 MED ORDER — HYDROCODONE-ACETAMINOPHEN 5-325 MG PO TABS
1.0000 | ORAL_TABLET | ORAL | Status: DC | PRN
  Administered 2022-02-04 – 2022-02-05 (×4): 2 via ORAL
  Administered 2022-02-06: 1 via ORAL
  Administered 2022-02-06: 2 via ORAL
  Administered 2022-02-07: 1 via ORAL
  Administered 2022-02-07 (×2): 2 via ORAL
  Administered 2022-02-09 – 2022-02-14 (×11): 1 via ORAL
  Administered 2022-02-15: 2 via ORAL
  Administered 2022-02-16: 1 via ORAL
  Administered 2022-02-17: 2 via ORAL
  Administered 2022-02-17: 1 via ORAL
  Administered 2022-02-18: 2 via ORAL
  Administered 2022-02-18: 1 via ORAL
  Administered 2022-02-18 – 2022-02-19 (×2): 2 via ORAL
  Filled 2022-02-04: qty 2
  Filled 2022-02-04: qty 1
  Filled 2022-02-04 (×2): qty 2
  Filled 2022-02-04: qty 1
  Filled 2022-02-04: qty 2
  Filled 2022-02-04: qty 1
  Filled 2022-02-04: qty 2
  Filled 2022-02-04 (×2): qty 1
  Filled 2022-02-04 (×3): qty 2
  Filled 2022-02-04 (×3): qty 1
  Filled 2022-02-04 (×2): qty 2
  Filled 2022-02-04: qty 1
  Filled 2022-02-04 (×3): qty 2
  Filled 2022-02-04 (×5): qty 1
  Filled 2022-02-04: qty 2
  Filled 2022-02-04: qty 1

## 2022-02-04 MED ORDER — CEFTRIAXONE SODIUM 1 G IJ SOLR
1.0000 g | INTRAMUSCULAR | Status: DC
  Filled 2022-02-04: qty 10

## 2022-02-04 MED ORDER — IPRATROPIUM-ALBUTEROL 0.5-2.5 (3) MG/3ML IN SOLN: 3.0000 mL | Freq: Four times a day (QID) | RESPIRATORY_TRACT | Status: DC | PRN

## 2022-02-04 MED ORDER — ONDANSETRON HCL 4 MG/2ML IJ SOLN
4.0000 mg | Freq: Four times a day (QID) | INTRAMUSCULAR | Status: DC | PRN
Start: 2022-02-04 — End: 2022-02-19
  Filled 2022-02-04 (×2): qty 2

## 2022-02-04 MED ORDER — LEVOTHYROXINE SODIUM 50 MCG PO TABS
50.0000 ug | ORAL_TABLET | Freq: Every day | ORAL | Status: DC
Start: 2022-02-05 — End: 2022-02-19
  Administered 2022-02-05 – 2022-02-19 (×15): 50 ug via ORAL
  Filled 2022-02-04 (×15): qty 1

## 2022-02-04 MED ORDER — MUPIROCIN 2 % EX OINT
TOPICAL_OINTMENT | Freq: Two times a day (BID) | CUTANEOUS | Status: DC
  Filled 2022-02-04: qty 22

## 2022-02-04 MED ORDER — LOSARTAN POTASSIUM 50 MG PO TABS
100.0000 mg | ORAL_TABLET | Freq: Every day | ORAL | Status: DC
  Administered 2022-02-05 – 2022-02-19 (×15): 100 mg via ORAL
  Filled 2022-02-04 (×15): qty 2

## 2022-02-04 MED ORDER — ONDANSETRON HCL 4 MG PO TABS
4.0000 mg | ORAL_TABLET | Freq: Four times a day (QID) | ORAL | Status: DC | PRN
Start: 2022-02-04 — End: 2022-02-19
  Filled 2022-02-04 (×2): qty 1

## 2022-02-04 MED ORDER — IPRATROPIUM-ALBUTEROL 0.5-2.5 (3) MG/3ML IN SOLN: 3.0000 mL | Freq: Four times a day (QID) | RESPIRATORY_TRACT | 1 refills | Status: DC | PRN

## 2022-02-04 MED ORDER — MUPIROCIN 2 % EX OINT: TOPICAL_OINTMENT | Freq: Two times a day (BID) | CUTANEOUS | 0 refills | Status: DC

## 2022-02-04 MED ORDER — MELATONIN 3 MG PO TABS: 3.0000 mg | ORAL_TABLET | Freq: Every day | ORAL | 0 refills | Status: DC

## 2022-02-04 MED ORDER — AMOXICILLIN-POT CLAVULANATE 875-125 MG PO TABS
1.0000 | ORAL_TABLET | Freq: Two times a day (BID) | ORAL | Status: DC
Start: 2022-02-04 — End: 2022-02-04
  Administered 2022-02-04: 1 via ORAL
  Filled 2022-02-04: qty 1

## 2022-02-04 MED ORDER — DOCUSATE SODIUM 100 MG PO CAPS
100.0000 mg | ORAL_CAPSULE | Freq: Two times a day (BID) | ORAL | Status: DC
  Administered 2022-02-04 – 2022-02-19 (×29): 100 mg via ORAL
  Filled 2022-02-04 (×30): qty 1

## 2022-02-04 MED ORDER — POLYVINYL ALCOHOL 1.4 % OP SOLN
2.0000 [drp] | Freq: Two times a day (BID) | OPHTHALMIC | Status: DC
  Administered 2022-02-04 – 2022-02-19 (×25): 2 [drp] via OPHTHALMIC
  Filled 2022-02-04: qty 15

## 2022-02-04 MED ORDER — ENOXAPARIN SODIUM 40 MG/0.4ML IJ SOSY
40.0000 mg | PREFILLED_SYRINGE | INTRAMUSCULAR | Status: DC
  Administered 2022-02-05 – 2022-02-16 (×12): 40 mg via SUBCUTANEOUS
  Filled 2022-02-04 (×12): qty 0.4

## 2022-02-04 MED ORDER — AMLODIPINE BESYLATE 10 MG PO TABS
10.0000 mg | ORAL_TABLET | Freq: Every day | ORAL | Status: DC
  Administered 2022-02-05 – 2022-02-19 (×15): 10 mg via ORAL
  Filled 2022-02-04 (×15): qty 1

## 2022-02-04 MED ORDER — BUPROPION HCL ER (XL) 300 MG PO TB24
300.0000 mg | ORAL_TABLET | Freq: Every day | ORAL | Status: DC
  Administered 2022-02-05 – 2022-02-19 (×15): 300 mg via ORAL
  Filled 2022-02-04 (×15): qty 1

## 2022-02-04 MED ORDER — DOCUSATE SODIUM 100 MG PO CAPS: 100.0000 mg | ORAL_CAPSULE | Freq: Two times a day (BID) | ORAL | 0 refills | Status: DC

## 2022-02-04 MED ORDER — IPRATROPIUM-ALBUTEROL 0.5-2.5 (3) MG/3ML IN SOLN
3.0000 mL | Freq: Once | RESPIRATORY_TRACT | Status: AC
  Administered 2022-02-04: 3 mL via RESPIRATORY_TRACT
  Filled 2022-02-04: qty 3

## 2022-02-04 MED ORDER — MELATONIN 3 MG PO TABS
3.0000 mg | ORAL_TABLET | Freq: Every day | ORAL | Status: DC
  Administered 2022-02-04: 3 mg via ORAL
  Filled 2022-02-04: qty 1

## 2022-02-04 MED ORDER — ONDANSETRON HCL 4 MG PO TABS
4.0000 mg | ORAL_TABLET | Freq: Four times a day (QID) | ORAL | Status: DC | PRN
Start: 2022-02-04 — End: 2022-02-19
  Administered 2022-02-05 – 2022-02-17 (×2): 4 mg via ORAL

## 2022-02-04 MED ORDER — SODIUM CHLORIDE 0.9 % IV SOLN
1.0000 g | INTRAVENOUS | Status: AC
  Administered 2022-02-04 – 2022-02-08 (×5): 1 g via INTRAVENOUS
  Filled 2022-02-04 (×5): qty 10

## 2022-02-04 MED ORDER — ONDANSETRON HCL 4 MG/2ML IJ SOLN
4.0000 mg | Freq: Four times a day (QID) | INTRAMUSCULAR | Status: DC | PRN
Start: 2022-02-04 — End: 2022-02-19
  Administered 2022-02-07 – 2022-02-08 (×2): 4 mg via INTRAVENOUS

## 2022-02-04 MED ORDER — ENOXAPARIN SODIUM 40 MG/0.4ML IJ SOSY: 40.0000 mg | PREFILLED_SYRINGE | INTRAMUSCULAR | Status: DC

## 2022-02-04 MED ORDER — SODIUM CHLORIDE 0.9 % IV SOLN: 1.0000 g | INTRAVENOUS | Status: DC

## 2022-02-04 MED ORDER — AMPHETAMINE-DEXTROAMPHETAMINE 20 MG PO TABS
30.0000 mg | ORAL_TABLET | Freq: Two times a day (BID) | ORAL | Status: DC
  Filled 2022-02-04: qty 1

## 2022-02-04 MED ORDER — SORBITOL 70 % SOLN
30.0000 mL | Freq: Once | Status: AC
  Administered 2022-02-04: 30 mL via ORAL
  Filled 2022-02-04: qty 30

## 2022-02-04 NOTE — Progress Notes (Signed)
[facility] notified that patient seems to be very anxious speech is rambling son stated that she hasn't slept in 3 days and patient did say she needs something to help her sleep she stated that she wasn't in any pain. New orders received. Arthor Captain LPN

## 2022-02-04 NOTE — Progress Notes (Signed)
Orthopaedics Daily Progress Note   02/04/2022   10:04 AM  [name] is a 81 y.o. female 2 Days Post-Op s/p CANNULATED HIP PINNING  Subjective Pain controlled.  Denies nausea, vomiting, or fevers.   Objective Vitals:   02/04/22 0423 02/04/22 0812  BP: 136/77 (!) 147/72  Pulse: 77 95  Resp: 18 17  Temp: 98.4 F (36.9 C) 98.4 F (36.9 C)  SpO2: 98% 91%    Intake/Output Summary (Last 24 hours) at 02/04/2022 1004 Last data filed at 02/04/2022 9211 Gross per 24 hour  Intake 900 ml  Output 1800 ml  Net -900 ml     Physical Exam RLE: Dressing clean, dry, and intact +DF/PF/EHL SILT SP/DP/T +DP/PT and WWP distally  Assessment 81 y.o. female s/p Procedure(s) (LRB): CANNULATED HIP PINNING (Right)  Plan Mobility: Out of bed with PT/OT Pain control: Continue to wean/titrate to appropriate oral regimen DVT Prophylaxis: Lovenox x6 weeks postoperatively Further surgical plans: None RUE: Weightbearing as tolerated, no restrictions LUE: Weightbearing as tolerated, no restrictions RLE: Weightbearing as tolerated, no restrictions LLE: Weightbearing as tolerated, no restrictions Disposition: Per primary team, as medically appropriate Dressing care: Keep AQUACEL on and dry for up to 14 days.  Do not allow surgical area to get wet before that.  Remove AQUACEL dressing after 14 days and allow area to get wet in shower but DO NOT SUBMERGE until wound is evaluated in clinic.  In most cases skin glue is used and no additional dressing is necessary.  Follow-up: Please call [place] [phone]) to schedule follow-op appointment for 2 weeks after surgery.   Georgeanna Harrison M.D. Orthopaedic Surgery [facility]

## 2022-02-04 NOTE — Evaluation (Signed)
 Occupational Therapy Evaluation Patient Details Name: [name] MRN: [id number] DOB: [birth year] Today's Date: 02/04/2022   History of Present Illness Pt is a 81 y.o. F who presents with a right valgus impacted subcapital femoral neck fracture now s/p cannulated hip pinning. Imaging right shoulder showing High riding right humerus consistent with rotator cuff pathology/tear and severe glenohumeral degenerative changes. Significant PMH: HTN, osteopenia, anxiety/depression, GERD.   Clinical Impression   PTA, pt lives with family, typically Independent in all ADLs, IADLs and mobility without AD. Pt presents now with deficits in pain, standing balance, strength and endurance. With pain premedication, pain well controlled today and pt eager to participate. Pt able to progress transfers and short distance mobility to Mod A-Min A x 2 using RW. Pt requires Setup for UB ADL and Mod A for LB ADLs due to deficits. Anticipate pt to progress quickly to high PLOF with AIR level therapies.      Recommendations for follow up therapy are one component of a multi-disciplinary discharge planning process, led by the attending physician.  Recommendations may be updated based on patient status, additional functional criteria and insurance authorization.   Follow Up Recommendations  Acute inpatient rehab (3hours/day)    Assistance Recommended at Discharge Intermittent Supervision/Assistance  Patient can return home with the following A little help with walking and/or transfers;A lot of help with bathing/dressing/bathroom;Assistance with cooking/housework;Assist for transportation;Help with stairs or ramp for entrance    Functional Status Assessment  Patient has had a recent decline in their functional status and demonstrates the ability to make significant improvements in function in a reasonable and predictable amount of time.  Equipment Recommendations  Other (comment) (Rolling walker)    Recommendations  for Other Services Rehab consult     Precautions / Restrictions Precautions Precautions: Fall Restrictions Weight Bearing Restrictions: Yes RLE Weight Bearing: Weight bearing as tolerated      Mobility Bed Mobility Overal bed mobility: Needs Assistance Bed Mobility: Supine to Sit     Supine to sit: Mod assist, HOB elevated     General bed mobility comments: assist to advance R LE (with pt using gait belt as leg lifter) and to lift trunk with cues to pull on bedrail    Transfers Overall transfer level: Needs assistance Equipment used: Rolling walker (2 wheels) Transfers: Sit to/from Stand, Bed to chair/wheelchair/BSC Sit to Stand: Mod assist     Step pivot transfers: Min assist, +2 safety/equipment     General transfer comment: Initially Mod A to stand progressing to Min A during session with cues for R LE placement, hand placement with DME use and cues for posture. Pt able to pivot to recliner after gait training with minor assist for RW manuevering and cues for sequencing.      Balance Overall balance assessment: Needs assistance Sitting-balance support: Feet supported Sitting balance-Leahy Scale: Fair     Standing balance support: Bilateral upper extremity supported, During functional activity, Reliant on assistive device for balance Standing balance-Leahy Scale: Poor                             ADL either performed or assessed with clinical judgement   ADL Overall ADL's : Needs assistance/impaired Eating/Feeding: Independent;Sitting   Grooming: Set up;Sitting   Upper Body Bathing: Set up;Sitting;With adaptive equipment   Lower Body Bathing: Minimal assistance;Sit to/from stand   Upper Body Dressing : Set up;Sitting   Lower Body Dressing: Moderate assistance;Sit to/from stand;Sitting/lateral leans  Lower Body Dressing Details (indicate cue type and reason): able to reach L sock well (brings LEs up to self for sock mgmt at home), unable to lift  R LE well enough to manage L sock. will need assist for donning over waist due to impaired balance and strength Toilet Transfer: Minimal assistance;Stand-pivot;Rolling walker (2 wheels);BSC/3in1   Toileting- Clothing Manipulation and Hygiene: Moderate assistance;Sit to/from stand;Sitting/lateral lean       Functional mobility during ADLs: Minimal assistance;+2 for safety/equipment;+2 for physical assistance;Rolling walker (2 wheels) General ADL Comments: Pt with expected deficits in LB ADLs and mobility with R LE pain/weakness and impaired balance, newly reliant on RW at this time.     Vision Baseline Vision/History: 1 Wears glasses Ability to See in Adequate Light: 0 Adequate Patient Visual Report: No change from baseline Vision Assessment?: No apparent visual deficits     Perception     Praxis      Pertinent Vitals/Pain Pain Assessment Pain Assessment: Faces Faces Pain Scale: Hurts little more Pain Location: R hip Pain Descriptors / Indicators: Grimacing, Operative site guarding Pain Intervention(s): Monitored during session, Premedicated before session     Hand Dominance Right   Extremity/Trunk Assessment Upper Extremity Assessment Upper Extremity Assessment: RUE deficits/detail RUE Deficits / Details: crepitation noted in R shoulder. Pt's family reports chronic issue, had considered shoulder replacement prior to COVID   Lower Extremity Assessment Lower Extremity Assessment: Defer to PT evaluation   Cervical / Trunk Assessment Cervical / Trunk Assessment: Kyphotic   Communication Communication Communication: HOH   Cognition Arousal/Alertness: Awake/alert Behavior During Therapy: WFL for tasks assessed/performed Overall Cognitive Status: Impaired/Different from baseline Area of Impairment: Memory, Problem solving                     Memory: Decreased short-term memory       Problem Solving: Requires verbal cues General Comments: repetition of  directions likely needed due to [place] Comments  Family at bedside, supportive    Exercises     Shoulder Instructions      Home Living Family/patient expects to be discharged to:: Private residence Living Arrangements: Spouse/significant other Available Help at Discharge: Family Type of Home: House Home Access: Stairs to enter Technical  of Steps: 1   Home Layout: One level     Bathroom Shower/Tub: Tub/shower unit;Walk-in shower   Bathroom Toilet: Standard     Home Equipment: Cane - single point          Prior Functioning/Environment Prior Level of Function : Independent/Modified Independent;Driving             Mobility Comments: No use of AD for mobility ADLs Comments: Drives, grocery shops, works in the garden        OT Problem List: Decreased strength;Decreased activity tolerance;Impaired balance (sitting and/or standing);Pain;Decreased knowledge of use of DME or AE;Decreased knowledge of precautions      OT Treatment/Interventions: Self-care/ADL training;Therapeutic exercise;Energy conservation;DME and/or AE instruction;Therapeutic activities;Patient/family education;Balance training    OT Goals(Current goals can be found in the care plan section) Acute Rehab OT Goals Patient Stated Goal: get better and go home OT Goal Formulation: With patient Time For Goal Achievement: 02/18/22 Potential to Achieve Goals: Good  OT Frequency: Min 2X/week    Co-evaluation PT/OT/SLP Co-Evaluation/Treatment: Yes Reason for Co-Treatment: For patient/therapist safety;To address functional/ADL transfers   OT goals addressed during session: ADL's and self-care      AM-PAC OT "6 Clicks" Daily Activity  Outcome Measure Help from another person eating meals?: None Help from another person taking care of personal grooming?: A Little Help from another person toileting, which includes using toliet, bedpan, or urinal?: A Lot Help from another person  bathing (including washing, rinsing, drying)?: A Little Help from another person to put on and taking off regular upper body clothing?: A Little Help from another person to put on and taking off regular lower body clothing?: A Lot 6 Click Score: 17   End of Session Equipment Utilized During Treatment: Gait belt;Rolling walker (2 wheels) Nurse Communication: Mobility status  Activity Tolerance: Patient tolerated treatment well Patient left: in chair;with call bell/phone within reach;with family/visitor present  OT Visit Diagnosis: Unsteadiness on feet (R26.81);Other abnormalities of gait and mobility (R26.89);Muscle weakness (generalized) (M62.81)                Time: 5638-7564 OT Time Calculation (min): 27 min Charges:  OT General Charges $OT Visit: 1 Visit OT Evaluation $OT Eval Moderate Complexity: 1 Mod  Malachy Chamber, OTR/L Acute Rehab Services Office: [phone]   Layla Maw 02/04/2022, 12:04 PM

## 2022-02-04 NOTE — Care Management Important Message (Signed)
Important Message  Patient Details  Name: [name] MRN: [id number] Date of Birth: September 25, 1940   Medicare Important Message Given:  Yes  Patient left prior to IM delivery will mail to the patient home address.    [name] 02/04/2022, 4:37 PM

## 2022-02-04 NOTE — Progress Notes (Signed)
Inpatient Rehabilitation Admission Medication Review by a Pharmacist  A complete drug regimen review was completed for this patient to identify any potential clinically significant medication issues.  High Risk Drug Classes Is patient taking? Indication by Medication  Antipsychotic No   Anticoagulant Yes Lovenox- VTE prophylaxis  Antibiotic No   Opioid Yes Norco- acute pain  Antiplatelet No   Hypoglycemics/insulin No   Vasoactive Medication Yes Norvasc, Cozaar- hypertension  Chemotherapy No   Other Yes Adderall- ADHD Wellbutrin- MDD Melatonin- sleep Synthroid- hypothyroidism     Type of Medication Issue Identified Description of Issue Recommendation(s)  Drug Interaction(s) (clinically significant)     Duplicate Therapy     Allergy     No Medication Administration End Date     Incorrect Dose     Additional Drug Therapy Needed     Significant med changes from prior encounter (inform family/care partners about these prior to discharge).    Other  PTA meds: HCTZ Restart PTA meds when and if necessary during CIR admission or at time of discharge, if warranted     Clinically significant medication issues were identified that warrant physician communication and completion of prescribed/recommended actions by midnight of the next day:  No   Time spent performing this drug regimen review (minutes):  30  Rufus Cypert BS, PharmD, BCPS Clinical Pharmacist 02/04/2022 12:33 PM  Contact: [phone] after 3 PM  "Be curious, not judgmental..." -Jamal Maes

## 2022-02-04 NOTE — Plan of Care (Signed)

## 2022-02-04 NOTE — Progress Notes (Signed)
[facility]) Hospital Liaison note:  Notified by Dorthy Cooler, PA-C of request for [facility] Palliative Care services. Will continue to follow for disposition.  Please call with any outpatient palliative questions or concerns.  Thank you for the opportunity to participate in this patient's care.  Thank you, Lorelee Market, LPN [facility] Liaison [phone]

## 2022-02-04 NOTE — Plan of Care (Signed)

## 2022-02-04 NOTE — Plan of Care (Signed)
  Problem: Education: Goal: Knowledge of General Education information will improve Description: Including pain rating scale, medication(s)/side effects and non-pharmacologic comfort measures 02/04/2022 1152 by Trixie Deis, RN Outcome: Adequate for Discharge 02/04/2022 1012 by Trixie Deis, RN Outcome: Progressing   Problem: Health Behavior/Discharge Planning: Goal: Ability to manage health-related needs will improve Outcome: Adequate for Discharge   Problem: Clinical Measurements: Goal: Ability to maintain clinical measurements within normal limits will improve Outcome: Adequate for Discharge Goal: Will remain free from infection Outcome: Adequate for Discharge Goal: Diagnostic test results will improve Outcome: Adequate for Discharge Goal: Respiratory complications will improve Outcome: Adequate for Discharge Goal: Cardiovascular complication will be avoided Outcome: Adequate for Discharge   Problem: Activity: Goal: Risk for activity intolerance will decrease 02/04/2022 1152 by Trixie Deis, RN Outcome: Adequate for Discharge 02/04/2022 1012 by Trixie Deis, RN Outcome: Progressing   Problem: Nutrition: Goal: Adequate nutrition will be maintained Outcome: Adequate for Discharge   Problem: Coping: Goal: Level of anxiety will decrease Outcome: Adequate for Discharge   Problem: Elimination: Goal: Will not experience complications related to bowel motility Outcome: Adequate for Discharge Goal: Will not experience complications related to urinary retention Outcome: Adequate for Discharge   Problem: Pain Managment: Goal: General experience of comfort will improve 02/04/2022 1152 by Trixie Deis, RN Outcome: Adequate for Discharge 02/04/2022 1012 by Trixie Deis, RN Outcome: Progressing   Problem: Safety: Goal: Ability to remain free from injury will improve 02/04/2022 1152 by Trixie Deis, RN Outcome: Adequate for Discharge 02/04/2022 1012 by  Trixie Deis, RN Outcome: Progressing   Problem: Skin Integrity: Goal: Risk for impaired skin integrity will decrease 02/04/2022 1152 by Trixie Deis, RN Outcome: Adequate for Discharge 02/04/2022 1012 by Trixie Deis, RN Outcome: Progressing

## 2022-02-04 NOTE — H&P (Signed)
Physical Medicine and Rehabilitation Admission H&P        Chief Complaint  Patient presents with   Fall  : HPI: [name] is an 81 year old right-handed female with history of hypertension, hypothyroidism, anxiety/depression, osteopenia.  Per chart review lives with spouse.  1 level home one-step to entry.  Independent prior to admission with occasional use of a single-point cane.  Presented 02/01/2022 after mechanical fall without loss of consciousness.  The patient was stepping into the garage and missed a step falling on her right side.  X-rays and imaging revealed mildly impacted subcapital right hip fracture.  Underwent open treatment of right valgus impacted subcapital femoral neck fracture with internal fixation 02/02/2022 per Dr. Georgeanna Harrison.  Weightbearing as tolerated right lower extremity.  Patient also found to have R rotator cuff tear- non operative at this time- Placed on Lovenox for DVT prophylaxis x6 weeks versus transition to Xarelto at discharge.  Plan to keep Aquacel on and dry for 14 days for wound care.  Leukocytosis 15,700 placed on Rocephin empirically with urinalysis completed negative nitrite.  Therapy evaluations completed due to patient decreased functional mobility was admitted for a comprehensive rehab program.     Pt reports no pain at rest, but pain 7-8/10 it appears when moving and "cannot stand pain"- Pain meds bring pain down to 0/10.  RUE "useless" since RTC tear, but doesn't want more surgery.  No BM since Saturday AM Peeing OK.      Review of Systems  Constitutional:  Negative for chills and fever.  HENT:  Negative for hearing loss.   Eyes:  Negative for blurred vision and double vision.  Respiratory:  Negative for cough and shortness of breath.   Cardiovascular:  Negative for chest pain, palpitations and leg swelling.  Gastrointestinal:  Positive for constipation. Negative for heartburn, nausea and vomiting.  Genitourinary:  Negative for  dysuria, flank pain and hematuria.  Musculoskeletal:  Positive for joint pain and myalgias.  Skin:  Negative for rash.  Psychiatric/Behavioral:  Positive for depression. The patient has insomnia.        Anxiety  All other systems reviewed and are negative.     Past Medical History:  Diagnosis Date   ALLERGIC RHINITIS 11/30/2008   ALLERGIC RHINITIS 11/30/2008   Anxiety     ASTHMA UNSPECIFIED WITH EXACERBATION 02/17/2008    pt. states she does not and has never has asthma   CAROTID BRUIT, LEFT 04/19/2007   Closed fracture of lateral malleolus 06/04/2009   CLOSED FRACTURE OF METATARSAL BONE 06/04/2009   Depression     Esophageal reflux 11/08/2008   GANGLION CYST 04/19/2007   HYPERTENSION 04/07/2007   Hypothyroidism     INSOMNIA 08/07/2010   OSTEOARTHRITIS 04/07/2007   OSTEOPENIA 03/10/2008   PONV (postoperative nausea and vomiting)     UTI 07/20/2009         Past Surgical History:  Procedure Laterality Date   EYE SURGERY Bilateral      cataract removal   FUNCTIONAL ENDOSCOPIC SINUS SURGERY   01/2018    spinal fluid leaking behind right eye   HIP PINNING,CANNULATED Right 02/02/2022    Procedure: CANNULATED HIP PINNING;  Surgeon: Georgeanna Harrison, MD;  Location: [facility];  Service: Orthopedics;  Laterality: Right;   JOINT REPLACEMENT        knuckle   REVERSE SHOULDER ARTHROPLASTY Left 09/09/2018   REVERSE SHOULDER ARTHROPLASTY Left 09/09/2018    Procedure: REVERSE SHOULDER ARTHROPLASTY;  Surgeon: Justice Britain, MD;  Location: [facility];  Service: Orthopedics;  Laterality: Left;  145mn   TOTAL HIP ARTHROPLASTY       TUBAL LIGATION             Family History  Problem Relation Age of Onset   Heart disease Mother          Mother also has significant carotid artery stenosis   COPD Father     Alcoholism Father     Cancer Brother     Alzheimer's disease Sister     Thyroid disease Sister          Goiter   Breast cancer Neg Hx      Social History:  reports that she quit smoking about 53 years  ago. Her smoking use included cigarettes. She has a 30.00 pack-year smoking history. She has never used smokeless tobacco. She reports that she does not drink alcohol and does not use drugs. Allergies: No Known Allergies       Medications Prior to Admission  Medication Sig Dispense Refill   amLODipine (NORVASC) 10 MG tablet Take 1 tablet (10 mg total) by mouth daily. 30 tablet 0   amphetamine-dextroamphetamine (ADDERALL) 30 MG tablet Take 1 tablet by mouth 2 (two) times daily. Fill in 2 months 60 tablet 0   aspirin-acetaminophen-caffeine (EXCEDRIN MIGRAINE) 2035-009-38MG per tablet Take 1 tablet by mouth every 6 (six) hours as needed. (Patient taking differently: Take 1 tablet by mouth daily as needed for headache or migraine.) 30 tablet 0   buPROPion (WELLBUTRIN XL) 300 MG 24 hr tablet TAKE 1 TABLET BY MOUTH ONCE DAILY (Patient taking differently: Take 300 mg by mouth daily.) 90 tablet 1   diclofenac sodium (VOLTAREN) 1 % GEL APPLY   TOPICALLY TO AFFECTED AREA 4 TIMES DAILY AS NEEDED FOR PAIN (Patient taking differently: Apply 1 g topically 4 (four) times daily as needed. FOR PAIN) 100 g 11   hydrochlorothiazide (MICROZIDE) 12.5 MG capsule Take 12.5 mg by mouth daily. TAKE 1 TABLET BY MOUTH ONCE DAILY.       levothyroxine (SYNTHROID) 50 MCG tablet Take 50 mcg by mouth daily.       losartan (COZAAR) 100 MG tablet Take 100 mg by mouth daily. TAKE 1 TABLET BY MOUTH ONCE DAILY.       Multiple Vitamin (MULTIVITAMIN) capsule Take 1 capsule by mouth daily. Centrum Silver       OVER THE COUNTER MEDICATION Take 1 tablet by mouth daily. Joint Support tablet includes:   Primal Celadrine turmeric       Polyethyl Glycol-Propyl Glycol 0.4-0.3 % SOLN Place 2 drops into both eyes 2 (two) times daily.              Home: Home Living Family/patient expects to be discharged to:: Private residence Living Arrangements: Spouse/significant other Available Help at Discharge: Family Type of Home: House Home  Access: Stairs to enter ETechnical brewerof Steps: 1 (through garage) Home Layout: One level Bathroom Shower/Tub: Tub/shower unit, Walk-in shower Home Equipment: [place]- single point   Functional History: Prior Function Prior Level of Function : Independent/Modified Independent   Functional Status:  Mobility: Bed Mobility Overal bed mobility: Needs Assistance Bed Mobility: Supine to Sit, Sit to Supine Supine to sit: Mod assist Sit to supine: Mod assist General bed mobility comments: Heavy modA to progress to edge of bed, assist for RLE management and trunk to upright. Verbal cues for sequencing and initiation. Assist for BLE's back into bed Transfers Overall transfer level:  Needs assistance Equipment used: Rolling walker (2 wheels), Ambulation equipment used Transfers: Sit to/from Stand Sit to Stand:  (unable) General transfer comment: Pt unable to stand with use of RW or Stedy, decreased ability to weight shift and weight bear through either LE   ADL:   Cognition: Cognition Overall Cognitive Status: Impaired/Different from baseline Orientation Level: Oriented to person, Oriented to place, Disoriented to time, Disoriented to situation Cognition Arousal/Alertness: Awake/alert Behavior During Therapy: WFL for tasks assessed/performed Overall Cognitive Status: Impaired/Different from baseline Area of Impairment: Memory, Problem solving Memory: Decreased short-term memory Problem Solving: Requires verbal cues General Comments: STM deficits noted   Physical Exam: Blood pressure (!) 147/72, pulse 95, temperature 98.4 F (36.9 C), temperature source Oral, resp. rate 17, height 5' 4.49" (1.638 m), weight 75.8 kg, SpO2 91 %. Physical Exam Vitals and nursing note reviewed. Exam conducted with a chaperone present.  Constitutional:      General: She is not in acute distress.    Appearance: Normal appearance. She is not ill-appearing.     Comments: Sitting up in bed; husband  and 2 other family members at bedside as well as nursing, NAD  HENT:     Head: Normocephalic and atraumatic.     Right Ear: External ear normal.     Left Ear: External ear normal.     Nose: Nose normal. No congestion.     Mouth/Throat:     Mouth: Mucous membranes are moist.     Pharynx: Oropharynx is clear. No oropharyngeal exudate.  Eyes:     General:        Right eye: No discharge.        Left eye: No discharge.     Extraocular Movements: Extraocular movements intact.  Cardiovascular:     Rate and Rhythm: Normal rate and regular rhythm.     Heart sounds: Normal heart sounds. No murmur heard.   No gallop.  Pulmonary:     Effort: Pulmonary effort is normal. No respiratory distress.     Breath sounds: Normal breath sounds. No wheezing, rhonchi or rales.  Abdominal:     Palpations: Abdomen is soft.     Comments: Slightly distended and hypoactive  Musculoskeletal:     Cervical back: Neck supple. No tenderness.     Comments: LUE 5/5 RUE- deltoid 2-/5- can only lift arm to 20-30 degrees abduction of flexion Otherwise 5-/5 in RUE- cannot participate in empty can test- obvious RTC tear LLE- 5/5 RLE- HF 2-/5; otherwise 5/5 in RLE  Skin:    General: Skin is warm and dry.     Comments: Right hip incision with Aquacel dressing in place. Dressing coming off- but incision glued- looks great mild surround erythema IV in RUE_ looks OK- but will remove  Neurological:     General: No focal deficit present.     Mental Status: She is alert and oriented to person, place, and time.     Comments: Patient is alert.  No acute distress.  Oriented x3 and follows commands.  Psychiatric:        Mood and Affect: Mood normal.        Behavior: Behavior normal.      Lab Results Last 48 Hours        Results for orders placed or performed during the hospital encounter of 02/01/22 (from the past 48 hour(s))  CBC     Status: Abnormal    Collection Time: 02/02/22  4:33 PM  Result Value Ref Range  WBC  15.7 (H) 4.0 - 10.5 K/uL    RBC 4.70 3.87 - 5.11 MIL/uL    Hemoglobin 13.2 12.0 - 15.0 g/dL    HCT 42.4 36.0 - 46.0 %    MCV 90.2 80.0 - 100.0 fL    MCH 28.1 26.0 - 34.0 pg    MCHC 31.1 30.0 - 36.0 g/dL    RDW 16.0 (H) 11.5 - 15.5 %    Platelets 292 150 - 400 K/uL    nRBC 0.0 0.0 - 0.2 %      Comment: Performed at [facility] [address], [place], [place] [address]  Creatinine, serum     Status: Abnormal    Collection Time: 02/02/22  4:33 PM  Result Value Ref Range    Creatinine, Ser 1.02 (H) 0.44 - 1.00 mg/dL    GFR, Estimated 55 (L) >60 mL/min      Comment: (NOTE) Calculated using the CKD-EPI Creatinine Equation (2021) Performed at [facility] [address] [place], [place] [address]    Urinalysis, Routine w reflex microscopic Urine, Unspecified Source     Status: Abnormal    Collection Time: 02/03/22  9:58 PM  Result Value Ref Range    Color, Urine YELLOW YELLOW    APPearance HAZY (A) CLEAR    Specific Gravity, Urine 1.011 1.005 - 1.030    pH 6.0 5.0 - 8.0    Glucose, UA NEGATIVE NEGATIVE mg/dL    Hgb urine dipstick MODERATE (A) NEGATIVE    Bilirubin Urine NEGATIVE NEGATIVE    Ketones, ur NEGATIVE NEGATIVE mg/dL    Protein, ur NEGATIVE NEGATIVE mg/dL    Nitrite NEGATIVE NEGATIVE    Leukocytes,Ua LARGE (A) NEGATIVE    RBC / HPF 11-20 0 - 5 RBC/hpf    WBC, UA >50 (H) 0 - 5 WBC/hpf    Bacteria, UA FEW (A) NONE SEEN    Squamous Epithelial / LPF 6-10 0 - 5      Comment: Performed at [facility], [address], [place], [place] [address]       Imaging Results (Last 48 hours)  DG C-Arm 1-60 Min-No Report   Result Date: 02/02/2022 Fluoroscopy was utilized by the requesting physician.  No radiographic interpretation.    DG HIP UNILAT WITH PELVIS 2-3 VIEWS RIGHT   Result Date: 02/02/2022 CLINICAL DATA:  ORIF RIGHT hip fracture. EXAM: DG HIP (WITH OR WITHOUT PELVIS) 3V RIGHT COMPARISON:  02/01/2022 radiographs FINDINGS: Intraoperative spot views  of the RIGHT hip are submitted postoperatively for interpretation. 3 surgical screws are noted traversing the RIGHT femoral neck fracture which appears in near anatomic alignment and position. No gross complicating features are noted. IMPRESSION: Internal fixation of RIGHT femoral neck fracture. Electronically Signed   By: Margarette Canada M.D.   On: 02/02/2022 10:17    DG Knee AP/LAT W/Sunrise Left   Result Date: 02/02/2022 CLINICAL DATA:  Pain during hip procedure. EXAM: LEFT KNEE 3 VIEWS COMPARISON:  None Available. FINDINGS: Degenerative changes in the lateral compartment with mild loss of joint space. No fractures, dislocations, or effusions, or other cause for pain identified. IMPRESSION: Lateral compartment degenerative changes. Electronically Signed   By: Dorise Bullion III M.D.   On: 02/02/2022 13:19          Blood pressure (!) 147/72, pulse 95, temperature 98.4 F (36.9 C), temperature source Oral, resp. rate 17, height 5' 4.49" (1.638 m), weight 75.8 kg, SpO2 91 %.   Medical Problem  List and Plan: 1. Functional deficits secondary to impacted subcapital right hip fracture after fall.  Status post ORIF 02/02/2022.  Weightbearing as tolerated.             -patient may  shower- with Aquacel dressing in place             -ELOS/Goals: `1-14 days Supervision to mod I 2.  Antithrombotics: -DVT/anticoagulation:  Pharmaceutical: Lovenox x6 weeks versus transition to Xarelto at discharge.  Check vascular study             -antiplatelet therapy: N/A 3. Pain Management: Hydrocodone as needed- encouraged pt to take for therapy.  4. Mood: Wellbutrin 300 mg daily, Adderall 30 mg twice daily, melatonin 3 mg nightly             -antipsychotic agents: N/A 5. Neuropsych: This patient is capable of making decisions on her own behalf. 6. Skin/Wound Care: Routine skin checks 7. Fluids/Electrolytes/Nutrition: Routine in and out with follow-up chemistries 8.  Leukocytosis.  Empiric Rocephin.  Urinalysis  negative urine culture pending. 9.  Hypertension.  Norvasc 10 mg daily, Cozaar 100 mg daily.  Monitor with increased mobility 10.  Hypothyroidism.  Synthroid 11.  Obesity.  BMI 28.23.  Dietary follow-up 12. R Rotator cuff tear- non surgical - pt didn't want surgery right now- pain control for now 13. Constipation- will give Sorbitol now for BM tonight hopefully.     I have personally performed a face to face diagnostic evaluation of this patient and formulated the key components of the plan.  Additionally, I have personally reviewed laboratory data, imaging studies, as well as relevant notes and concur with the physician assistant's documentation above.   The patient's status has not changed from the original H&P.  Any changes in documentation from the acute care chart have been noted above.       Lavon Paganini Angiulli, PA-C 02/04/2022

## 2022-02-04 NOTE — Progress Notes (Signed)
Patient arrieved via bed with family accompanying at approximately 73. Patient is A&O x 4 and able to make her needs known. Patient continent of bladder. Denies pain or discomfort while lying in bed. Bruising noted to bilateral arms and ABD. Left foot calluses and patient stats planters wart. Positive pedal pulses, foot warm to touch and cap refill less than 3 seconds. Understands the use of the call light.

## 2022-02-04 NOTE — Progress Notes (Signed)
PMR Admission Coordinator Pre-Admission Assessment   Patient: [name] is an 81 y.o., female MRN: [id number] DOB: 04-23-1941 Height: 5' 4.49" (163.8 cm) Weight: 75.8 kg   Insurance Information HMO:     PPO:     PCP:      IPA:      80/20:      OTHER:  PRIMARY: Medicare A/B      Policy#: [id number]      Subscriber: pt CM Name:       Phone#:      Fax#:  Pre-Cert#: verified Civil engineer, contracting:  Benefits:  Phone #:      Name:  Eff. Date: A 12/14/04, B 06/16/11     Deduct: $1600       Out of Pocket Max: n/a      Life Max: n/a CIR: 100%      SNF: 20 full days Outpatient: 80%     Co-Ins: 20% Home Health: 100%      Co-Pay:  DME: 80%     Co-Ins: 20% Providers:  SECONDARY: BCBS      Policy#: [id number]     Phone#: [phone]   Financial Counselor:       Phone#:    The "Data Collection Information Summary" for patients in Inpatient Rehabilitation Facilities with attached "Privacy Act [place] Records" was provided and verbally reviewed with: Patient and Family   Emergency Contact Information Contact Information       Name Relation Home Work Mobile    Marlin Spouse     [phone]    Gerilynn, Mccullars     [phone]           Current Medical History  Patient Admitting Diagnosis: R hip fracture, R rotator cuff tear   History of Present Illness: [name] is an 81 year old right-handed female with history of hypertension, hypothyroidism, anxiety/depression, osteopenia.  Presented 02/01/2022 after mechanical fall without loss of consciousness.  The patient was stepping into the garage and missed a step, falling on her right side.  X-rays and imaging revealed mildly impacted subcapital right hip fracture.  Also noted chronic RUE rotator cuff tear, likely exacerbated by fall.  Underwent open treatment of right valgus impacted subcapital femoral neck fracture with internal fixation 02/02/2022 per Dr. Georgeanna Harrison.  Weightbearing as tolerated right lower  extremity.  Placed on Lovenox for DVT prophylaxis x6 weeks versus transition to Xarelto at discharge.  Plan to keep Aquacel on and dry for 14 days for wound care.  Leukocytosis 15,700 placed on Rocephin empirically with urinalysis completed negative nitrite.  Therapy evaluations completed due to patient decreased functional mobility was admitted for a comprehensive rehab program.   Patient's medical record from [facility] has been reviewed by the rehabilitation admission coordinator and physician.   Past Medical History      Past Medical History:  Diagnosis Date   ALLERGIC RHINITIS 11/30/2008   ALLERGIC RHINITIS 11/30/2008   Anxiety     ASTHMA UNSPECIFIED WITH EXACERBATION 02/17/2008    pt. states she does not and has never has asthma   CAROTID BRUIT, LEFT 04/19/2007   Closed fracture of lateral malleolus 06/04/2009   CLOSED FRACTURE OF METATARSAL BONE 06/04/2009   Depression     Esophageal reflux 11/08/2008   GANGLION CYST 04/19/2007   HYPERTENSION 04/07/2007   Hypothyroidism     INSOMNIA 08/07/2010   OSTEOARTHRITIS 04/07/2007   OSTEOPENIA 03/10/2008   PONV (postoperative nausea and vomiting)  UTI 07/20/2009      Has the patient had major surgery during 100 days prior to admission? Yes   Family History   family history includes Alcoholism in her father; Alzheimer's disease in her sister; COPD in her father; Cancer in her brother; Heart disease in her mother; Thyroid disease in her sister.   Current Medications   Current Facility-Administered Medications:    alum & mag hydroxide-simeth (MAALOX/MYLANTA) 200-200-20 MG/5ML suspension 30 mL, 30 mL, Oral, Q4H PRN, Hosie Poisson, MD, 30 mL at 02/03/22 2227   amLODipine (NORVASC) tablet 10 mg, 10 mg, Oral, Daily, Truett Mainland, DO, 10 mg at 02/04/22 4008   amphetamine-dextroamphetamine (ADDERALL) tablet 30 mg, 30 mg, Oral, BID WC, Stinson, Jacob J, DO, 30 mg at 02/04/22 6761   buPROPion (WELLBUTRIN XL) 24 hr tablet 300 mg, 300 mg, Oral, Daily,  Truett Mainland, DO, 300 mg at 02/04/22 0816   [START ON 02/05/2022] cefTRIAXone (ROCEPHIN) 1 g in sodium chloride 0.9 % 100 mL IVPB, 1 g, Intravenous, Q24H, Karleen Hampshire, Vijaya, MD   docusate sodium (COLACE) capsule 100 mg, 100 mg, Oral, BID, Georgeanna Harrison, MD, 100 mg at 02/04/22 0816   enoxaparin (LOVENOX) injection 40 mg, 40 mg, Subcutaneous, Q24H, Georgeanna Harrison, MD, 40 mg at 02/04/22 9509   HYDROcodone-acetaminophen (NORCO/VICODIN) 5-325 MG per tablet 1-2 tablet, 1-2 tablet, Oral, Q4H PRN, Georgeanna Harrison, MD, 1 tablet at 02/03/22 2210   ipratropium-albuterol (DUONEB) 0.5-2.5 (3) MG/3ML nebulizer solution 3 mL, 3 mL, Nebulization, Once, Hosie Poisson, MD   levothyroxine (SYNTHROID) tablet 50 mcg, 50 mcg, Oral, Q0600, Truett Mainland, DO, 50 mcg at 02/04/22 0708   losartan (COZAAR) tablet 100 mg, 100 mg, Oral, Daily, Truett Mainland, DO, 100 mg at 02/04/22 0816   melatonin tablet 3 mg, 3 mg, Oral, QHS, Kathryne Eriksson, NP, 3 mg at 02/04/22 0105   menthol-cetylpyridinium (CEPACOL) lozenge 3 mg, 1 lozenge, Oral, PRN **OR** phenol (CHLORASEPTIC) mouth spray 1 spray, 1 spray, Mouth/Throat, PRN, Georgeanna Harrison, MD   morphine (PF) 4 MG/ML injection 4 mg, 4 mg, Intravenous, Q4H PRN, Truett Mainland, DO, 4 mg at 02/03/22 3267   mupirocin ointment (BACTROBAN) 2 %, , Nasal, BID, Hosie Poisson, MD, Given at 02/04/22 1022   ondansetron (ZOFRAN) tablet 4 mg, 4 mg, Oral, Q6H PRN **OR** ondansetron (ZOFRAN) injection 4 mg, 4 mg, Intravenous, Q6H PRN, Truett Mainland, DO, 4 mg at 02/02/22 1559   ondansetron (ZOFRAN) tablet 4 mg, 4 mg, Oral, Q6H PRN **OR** ondansetron (ZOFRAN) injection 4 mg, 4 mg, Intravenous, Q6H PRN, Georgeanna Harrison, MD   polyvinyl alcohol (LIQUIFILM TEARS) 1.4 % ophthalmic solution 2 drop, 2 drop, Both Eyes, BID, Stinson, Jacob J, DO, 2 drop at 02/04/22 1245   senna (SENOKOT) tablet 8.6 mg, 1 tablet, Oral, BID, Truett Mainland, DO, 8.6 mg at 02/04/22 8099   Patients Current Diet:  Diet Order                   Diet regular Room service appropriate? Yes; Fluid consistency: Thin  Diet effective now                         Precautions / Restrictions Precautions Precautions: Fall Restrictions Weight Bearing Restrictions: Yes RLE Weight Bearing: Weight bearing as tolerated    Has the patient had 2 or more falls or a fall with injury in the past year? Yes   Prior Activity Level Community (5-7x/wk): no DME at  baseline, not driving, but went out with spouse near daily, mod I with mobility/ADLs/IADLs   Prior Functional Level Self Care: Did the patient need help bathing, dressing, using the toilet or eating? Independent   Indoor Mobility: Did the patient need assistance with walking from room to room (with or without device)? Independent   Stairs: Did the patient need assistance with internal or external stairs (with or without device)? Independent   Functional Cognition: Did the patient need help planning regular tasks such as shopping or remembering to take medications? Independent   Patient Information Are you of Hispanic, Latino/a,or Spanish origin?: A. No, not of Hispanic, Latino/a, or Spanish origin What is your race?: A. White Do you need or want an interpreter to communicate with a doctor or health care staff?: 0. No   Patient's Response To:  Health Literacy and Transportation Is the patient able to respond to health literacy and transportation needs?: Yes Health Literacy - How often do you need to have someone help you when you read instructions, pamphlets, or other written material from your doctor or pharmacy?: Never In the past 12 months, has lack of transportation kept you from medical appointments or from getting medications?: No In the past 12 months, has lack of transportation kept you from meetings, work, or from getting things needed for daily living?: No   Development worker, international aid / [place] Devices/Equipment: None Home Equipment: Cane  - single point   Prior Device Use: Indicate devices/aids used by the patient prior to current illness, exacerbation or injury? None of the above   Current Functional Level Cognition   Overall Cognitive Status: Impaired/Different from baseline Orientation Level: Oriented X4 General Comments: STM deficits noted    Extremity Assessment (includes Sensation/Coordination)   Upper Extremity Assessment: Defer to OT evaluation  Lower Extremity Assessment: Generalized weakness, LLE deficits/detail LLE Deficits / Details: Grossly 1/5 except ankle dorsiflexion WFL     ADLs         Mobility   Overal bed mobility: Needs Assistance Bed Mobility: Supine to Sit, Sit to Supine Supine to sit: Mod assist Sit to supine: Mod assist General bed mobility comments: Heavy modA to progress to edge of bed, assist for RLE management and trunk to upright. Verbal cues for sequencing and initiation. Assist for BLE's back into bed     Transfers   Overall transfer level: Needs assistance Equipment used: Rolling walker (2 wheels), Ambulation equipment used Transfers: Sit to/from Stand Sit to Stand:  (unable) General transfer comment: Pt unable to stand with use of RW or Stedy, decreased ability to weight shift and weight bear through either LE     Ambulation / Gait / Stairs / Proofreader / Balance Balance Overall balance assessment: Needs assistance Sitting-balance support: Feet supported Sitting balance-Leahy Scale: Fair     Special needs/care consideration Skin surgical incision to R hip, do not get wet x14 days    Previous Home Environment (from acute therapy documentation) Living Arrangements: Spouse/significant other Available Help at Discharge: Family Type of Home: House Home Layout: One level Home Access: Stairs to enter [employer] of Steps: 1 (through garage) Bathroom Shower/Tub: Tub/shower unit, Gaffer Harmony: No   Discharge Living  Setting Plans for Discharge Living Setting: Patient's home, Lives with (comment) (spouse) Type of Home at Discharge: House Discharge Home Layout: One level Discharge Home Access: Stairs to enter Entrance Stairs-Rails: None Entrance Stairs-Number of Steps: 1 (in  garage) Discharge Bathroom Shower/Tub: Tub/shower unit, Walk-in shower Discharge Bathroom Toilet: Standard (has both standard and handicapped height) Discharge Bathroom Accessibility: Yes How Accessible: Accessible via walker Does the patient have any problems obtaining your medications?: No   Social/Family/Support Systems Patient Roles: Spouse Contact Information: Carrie Mew Anticipated Caregiver: Spouse, [name], with PRN assist from son and daughter in law Anticipated Caregiver's Contact Information: [name]: [phone] Ability/Limitations of Caregiver: min assist Caregiver Availability: 24/7 Discharge Plan Discussed with Primary Caregiver: Yes Is Caregiver In Agreement with Plan?: Yes Does Caregiver/Family have Issues with Lodging/Transportation while Pt is in Rehab?: No   Goals Patient/Family Goal for Rehab: PT/OT supervision to mod I, SLP n/a Expected length of stay: 12-14 days Additional Information: pt with advanced RUE RC tear, likely exacerbated by fall but no intervention at this time. Pt/Family Agrees to Admission and willing to participate: Yes Program Orientation Provided & Reviewed with Pt/Caregiver Including Roles  & Responsibilities: Yes  Barriers to Discharge:  (pain)   Decrease burden of Care through IP rehab admission: n/a   Possible need for SNF placement upon discharge: No   Patient Condition: I have reviewed medical records from [facility], spoken with CM, and patient and family member. I met with patient at the bedside for inpatient rehabilitation assessment.  Patient will benefit from ongoing PT and OT, can actively participate in 3 hours of therapy a day 5 days of the week, and can make  measurable gains during the admission.  Patient will also benefit from the coordinated team approach during an Inpatient Acute Rehabilitation admission.  The patient will receive intensive therapy as well as Rehabilitation physician, nursing, social worker, and care management interventions.  Due to safety, skin/wound care, medication administration, pain management, and patient education the patient requires 24 hour a day rehabilitation nursing.  The patient is currently mod to max assist +2 with mobility and basic ADLs.  Discharge setting and therapy post discharge at home with home health is anticipated.  Patient has agreed to participate in the Acute Inpatient Rehabilitation Program and will admit today.   Preadmission Screen Completed By:  Michel Santee, PT, DPT 02/04/2022 10:27 AM ______________________________________________________________________   Discussed status with Dr. Dagoberto Ligas on 02/04/22  at 10:38 AM  and received approval for admission today.   Admission Coordinator:  Michel Santee, PT, DPT time 10:38 AM Sudie Grumbling 02/04/22     Assessment/Plan: Diagnosis: Does the need for close, 24 hr/day Medical supervision in concert with the patient's rehab needs make it unreasonable for this patient to be served in a less intensive setting? Yes Co-Morbidities requiring supervision/potential complications: R hip fx s/p pinning- WBAT; R RTC tear non op; HTN; osteopenia; hypothyroidism, anxiety/depression Due to bladder management, bowel management, safety, skin/wound care, disease management, medication administration, pain management, and patient education, does the patient require 24 hr/day rehab nursing? Yes Does the patient require coordinated care of a physician, rehab nurse, PT, OT, and SLP to address physical and functional deficits in the context of the above medical diagnosis(es)? Yes Addressing deficits in the following areas: balance, endurance, locomotion, strength, transferring,  bowel/bladder control, bathing, dressing, feeding, grooming, and toileting Can the patient actively participate in an intensive therapy program of at least 3 hrs of therapy 5 days a week? Yes The potential for patient to make measurable gains while on inpatient rehab is good Anticipated functional outcomes upon discharge from inpatient rehab: modified independent and supervision PT, modified independent and supervision OT, n/a SLP Estimated rehab length of stay to  reach the above functional goals is: 12-14 days Anticipated discharge destination: Home 10. Overall Rehab/Functional Prognosis: good     MD Signature:

## 2022-02-04 NOTE — Discharge Instructions (Addendum)
Inpatient Rehab Discharge Instructions  [facility] Discharge date and time: No discharge date for patient encounter.   Activities/Precautions/ Functional Status: Activity: activity as tolerated Diet: regular diet Wound Care: Routine skin checks Functional status:  ___ No restrictions     ___ Walk up steps independently ___ 24/7 supervision/assistance   ___ Walk up steps with assistance ___ Intermittent supervision/assistance  ___ Bathe/dress independently ___ Walk with walker     __x_ Bathe/dress with assistance ___ Walk Independently    ___ Shower independently ___ Walk with assistance    ___ Shower with assistance ___ No alcohol     ___ Return to work/school ________  Special Instructions: No driving smoking or alcohol  Continue Xarelto through 03/19/2022 and stop   My questions have been answered and I understand these instructions. I will adhere to these goals and the provided educational materials after my discharge from the hospital.  Patient/Caregiver Signature _______________________________ Date __________  Clinician Signature _______________________________________ Date __________  Please bring this form and your medication list with you to all your follow-up doctor's appointments.

## 2022-02-04 NOTE — H&P (Signed)
Physical Medicine and Rehabilitation Admission H&P    Chief Complaint  Patient presents with   Fall  : HPI: [name] is an 81 year old right-handed female with history of hypertension, hypothyroidism, anxiety/depression, osteopenia.  Per chart review lives with spouse.  1 level home one-step to entry.  Independent prior to admission with occasional use of a single-point cane.  Presented 02/01/2022 after mechanical fall without loss of consciousness.  The patient was stepping into the garage and missed a step falling on her right side.  X-rays and imaging revealed mildly impacted subcapital right hip fracture.  Underwent open treatment of right valgus impacted subcapital femoral neck fracture with internal fixation 02/02/2022 per Dr. Georgeanna Harrison.  Weightbearing as tolerated right lower extremity.  Patient also found to have R rotator cuff tear- non operative at this time- Placed on Lovenox for DVT prophylaxis x6 weeks versus transition to Xarelto at discharge.  Plan to keep Aquacel on and dry for 14 days for wound care.  Leukocytosis 15,700 placed on Rocephin empirically with urinalysis completed negative nitrite.  Therapy evaluations completed due to patient decreased functional mobility was admitted for a comprehensive rehab program.   Pt reports no pain at rest, but pain 7-8/10 it appears when moving and "cannot stand pain"- Pain meds bring pain down to 0/10.  RUE "useless" since RTC tear, but doesn't want more surgery.  No BM since Saturday AM Peeing OK.    Review of Systems  Constitutional:  Negative for chills and fever.  HENT:  Negative for hearing loss.   Eyes:  Negative for blurred vision and double vision.  Respiratory:  Negative for cough and shortness of breath.   Cardiovascular:  Negative for chest pain, palpitations and leg swelling.  Gastrointestinal:  Positive for constipation. Negative for heartburn, nausea and vomiting.  Genitourinary:  Negative for dysuria,  flank pain and hematuria.  Musculoskeletal:  Positive for joint pain and myalgias.  Skin:  Negative for rash.  Psychiatric/Behavioral:  Positive for depression. The patient has insomnia.        Anxiety  All other systems reviewed and are negative. Past Medical History:  Diagnosis Date   ALLERGIC RHINITIS 11/30/2008   ALLERGIC RHINITIS 11/30/2008   Anxiety    ASTHMA UNSPECIFIED WITH EXACERBATION 02/17/2008   pt. states she does not and has never has asthma   CAROTID BRUIT, LEFT 04/19/2007   Closed fracture of lateral malleolus 06/04/2009   CLOSED FRACTURE OF METATARSAL BONE 06/04/2009   Depression    Esophageal reflux 11/08/2008   GANGLION CYST 04/19/2007   HYPERTENSION 04/07/2007   Hypothyroidism    INSOMNIA 08/07/2010   OSTEOARTHRITIS 04/07/2007   OSTEOPENIA 03/10/2008   PONV (postoperative nausea and vomiting)    UTI 07/20/2009   Past Surgical History:  Procedure Laterality Date   EYE SURGERY Bilateral    cataract removal   FUNCTIONAL ENDOSCOPIC SINUS SURGERY  01/2018   spinal fluid leaking behind right eye   HIP PINNING,CANNULATED Right 02/02/2022   Procedure: CANNULATED HIP PINNING;  Surgeon: Georgeanna Harrison, MD;  Location: [facility];  Service: Orthopedics;  Laterality: Right;   JOINT REPLACEMENT     knuckle   REVERSE SHOULDER ARTHROPLASTY Left 09/09/2018   REVERSE SHOULDER ARTHROPLASTY Left 09/09/2018   Procedure: REVERSE SHOULDER ARTHROPLASTY;  Surgeon: Justice Britain, MD;  Location: [facility];  Service: Orthopedics;  Laterality: Left;  123mn   TOTAL HIP ARTHROPLASTY     TUBAL LIGATION     Family History  Problem Relation Age  of Onset   Heart disease Mother        Mother also has significant carotid artery stenosis   COPD Father    Alcoholism Father    Cancer Brother    Alzheimer's disease Sister    Thyroid disease Sister        Goiter   Breast cancer Neg Hx    Social History:  reports that she quit smoking about 53 years ago. Her smoking use included cigarettes. She has a 30.00  pack-year smoking history. She has never used smokeless tobacco. She reports that she does not drink alcohol and does not use drugs. Allergies: No Known Allergies Medications Prior to Admission  Medication Sig Dispense Refill   amLODipine (NORVASC) 10 MG tablet Take 1 tablet (10 mg total) by mouth daily. 30 tablet 0   amphetamine-dextroamphetamine (ADDERALL) 30 MG tablet Take 1 tablet by mouth 2 (two) times daily. Fill in 2 months 60 tablet 0   aspirin-acetaminophen-caffeine (EXCEDRIN MIGRAINE) 147-829-56 MG per tablet Take 1 tablet by mouth every 6 (six) hours as needed. (Patient taking differently: Take 1 tablet by mouth daily as needed for headache or migraine.) 30 tablet 0   buPROPion (WELLBUTRIN XL) 300 MG 24 hr tablet TAKE 1 TABLET BY MOUTH ONCE DAILY (Patient taking differently: Take 300 mg by mouth daily.) 90 tablet 1   diclofenac sodium (VOLTAREN) 1 % GEL APPLY   TOPICALLY TO AFFECTED AREA 4 TIMES DAILY AS NEEDED FOR PAIN (Patient taking differently: Apply 1 g topically 4 (four) times daily as needed. FOR PAIN) 100 g 11   hydrochlorothiazide (MICROZIDE) 12.5 MG capsule Take 12.5 mg by mouth daily. TAKE 1 TABLET BY MOUTH ONCE DAILY.     levothyroxine (SYNTHROID) 50 MCG tablet Take 50 mcg by mouth daily.     losartan (COZAAR) 100 MG tablet Take 100 mg by mouth daily. TAKE 1 TABLET BY MOUTH ONCE DAILY.     Multiple Vitamin (MULTIVITAMIN) capsule Take 1 capsule by mouth daily. Centrum Silver     OVER THE COUNTER MEDICATION Take 1 tablet by mouth daily. Joint Support tablet includes:  Primal Celadrine turmeric     Polyethyl Glycol-Propyl Glycol 0.4-0.3 % SOLN Place 2 drops into both eyes 2 (two) times daily.        Home: Home Living Family/patient expects to be discharged to:: Private residence Living Arrangements: Spouse/significant other Available Help at Discharge: Family Type of Home: House Home Access: Stairs to enter Technical brewer of Steps: 1 (through garage) Home  Layout: One level Bathroom Shower/Tub: Tub/shower unit, Walk-in shower Home Equipment: [place] - single point   Functional History: Prior Function Prior Level of Function : Independent/Modified Independent  Functional Status:  Mobility: Bed Mobility Overal bed mobility: Needs Assistance Bed Mobility: Supine to Sit, Sit to Supine Supine to sit: Mod assist Sit to supine: Mod assist General bed mobility comments: Heavy modA to progress to edge of bed, assist for RLE management and trunk to upright. Verbal cues for sequencing and initiation. Assist for BLE's back into bed Transfers Overall transfer level: Needs assistance Equipment used: Rolling walker (2 wheels), Ambulation equipment used Transfers: Sit to/from Stand Sit to Stand:  (unable) General transfer comment: Pt unable to stand with use of RW or Stedy, decreased ability to weight shift and weight bear through either LE      ADL:    Cognition: Cognition Overall Cognitive Status: Impaired/Different from baseline Orientation Level: Oriented to person, Oriented to place, Disoriented to time, Disoriented to situation Cognition  Arousal/Alertness: Awake/alert Behavior During Therapy: WFL for tasks assessed/performed Overall Cognitive Status: Impaired/Different from baseline Area of Impairment: Memory, Problem solving Memory: Decreased short-term memory Problem Solving: Requires verbal cues General Comments: STM deficits noted  Physical Exam: Blood pressure (!) 147/72, pulse 95, temperature 98.4 F (36.9 C), temperature source Oral, resp. rate 17, height 5' 4.49" (1.638 m), weight 75.8 kg, SpO2 91 %. Physical Exam Vitals and nursing note reviewed. Exam conducted with a chaperone present.  Constitutional:      General: She is not in acute distress.    Appearance: Normal appearance. She is not ill-appearing.     Comments: Sitting up in bed; husband and 2 other family members at bedside as well as nursing, NAD  HENT:      Head: Normocephalic and atraumatic.     Right Ear: External ear normal.     Left Ear: External ear normal.     Nose: Nose normal. No congestion.     Mouth/Throat:     Mouth: Mucous membranes are moist.     Pharynx: Oropharynx is clear. No oropharyngeal exudate.  Eyes:     General:        Right eye: No discharge.        Left eye: No discharge.     Extraocular Movements: Extraocular movements intact.  Cardiovascular:     Rate and Rhythm: Normal rate and regular rhythm.     Heart sounds: Normal heart sounds. No murmur heard.   No gallop.  Pulmonary:     Effort: Pulmonary effort is normal. No respiratory distress.     Breath sounds: Normal breath sounds. No wheezing, rhonchi or rales.  Abdominal:     Palpations: Abdomen is soft.     Comments: Slightly distended and hypoactive  Musculoskeletal:     Cervical back: Neck supple. No tenderness.     Comments: LUE 5/5 RUE- deltoid 2-/5- can only lift arm to 20-30 degrees abduction of flexion Otherwise 5-/5 in RUE- cannot participate in empty can test- obvious RTC tear LLE- 5/5 RLE- HF 2-/5; otherwise 5/5 in RLE  Skin:    General: Skin is warm and dry.     Comments: Right hip incision with Aquacel dressing in place. Dressing coming off- but incision glued- looks great mild surround erythema IV in RUE_ looks OK- but will remove  Neurological:     General: No focal deficit present.     Mental Status: She is alert and oriented to person, place, and time.     Comments: Patient is alert.  No acute distress.  Oriented x3 and follows commands.  Psychiatric:        Mood and Affect: Mood normal.        Behavior: Behavior normal.    Results for orders placed or performed during the hospital encounter of 02/01/22 (from the past 48 hour(s))  CBC     Status: Abnormal   Collection Time: 02/02/22  4:33 PM  Result Value Ref Range   WBC 15.7 (H) 4.0 - 10.5 K/uL   RBC 4.70 3.87 - 5.11 MIL/uL   Hemoglobin 13.2 12.0 - 15.0 g/dL   HCT 42.4 36.0 -  46.0 %   MCV 90.2 80.0 - 100.0 fL   MCH 28.1 26.0 - 34.0 pg   MCHC 31.1 30.0 - 36.0 g/dL   RDW 16.0 (H) 11.5 - 15.5 %   Platelets 292 150 - 400 K/uL   nRBC 0.0 0.0 - 0.2 %    Comment: Performed at [facility]  [facility], [place] [address], [place], [place] [address]  Creatinine, serum     Status: Abnormal   Collection Time: 02/02/22  4:33 PM  Result Value Ref Range   Creatinine, Ser 1.02 (H) 0.44 - 1.00 mg/dL   GFR, Estimated 55 (L) >60 mL/min    Comment: (NOTE) Calculated using the CKD-EPI Creatinine Equation (2021) Performed at [facility] [address] [place], [place] [address]   Urinalysis, Routine w reflex microscopic Urine, Unspecified Source     Status: Abnormal   Collection Time: 02/03/22  9:58 PM  Result Value Ref Range   Color, Urine YELLOW YELLOW   APPearance HAZY (A) CLEAR   Specific Gravity, Urine 1.011 1.005 - 1.030   pH 6.0 5.0 - 8.0   Glucose, UA NEGATIVE NEGATIVE mg/dL   Hgb urine dipstick MODERATE (A) NEGATIVE   Bilirubin Urine NEGATIVE NEGATIVE   Ketones, ur NEGATIVE NEGATIVE mg/dL   Protein, ur NEGATIVE NEGATIVE mg/dL   Nitrite NEGATIVE NEGATIVE   Leukocytes,Ua LARGE (A) NEGATIVE   RBC / HPF 11-20 0 - 5 RBC/hpf   WBC, UA >50 (H) 0 - 5 WBC/hpf   Bacteria, UA FEW (A) NONE SEEN   Squamous Epithelial / LPF 6-10 0 - 5    Comment: Performed at [facility], [address] [place],  [address]   DG C-Arm 1-60 Min-No Report  Result Date: 02/02/2022 Fluoroscopy was utilized by the requesting physician.  No radiographic interpretation.   DG HIP UNILAT WITH PELVIS 2-3 VIEWS RIGHT  Result Date: 02/02/2022 CLINICAL DATA:  ORIF RIGHT hip fracture. EXAM: DG HIP (WITH OR WITHOUT PELVIS) 3V RIGHT COMPARISON:  02/01/2022 radiographs FINDINGS: Intraoperative spot views of the RIGHT hip are submitted postoperatively for interpretation. 3 surgical screws are noted traversing the RIGHT femoral neck fracture which appears in near anatomic alignment and  position. No gross complicating features are noted. IMPRESSION: Internal fixation of RIGHT femoral neck fracture. Electronically Signed   By: Margarette Canada M.D.   On: 02/02/2022 10:17   DG Knee AP/LAT W/[place] Left  Result Date: 02/02/2022 CLINICAL DATA:  Pain during hip procedure. EXAM: LEFT KNEE 3 VIEWS COMPARISON:  None Available. FINDINGS: Degenerative changes in the lateral compartment with mild loss of joint space. No fractures, dislocations, or effusions, or other cause for pain identified. IMPRESSION: Lateral compartment degenerative changes. Electronically Signed   By: Dorise Bullion III M.D.   On: 02/02/2022 13:19      Blood pressure (!) 147/72, pulse 95, temperature 98.4 F (36.9 C), temperature source Oral, resp. rate 17, height 5' 4.49" (1.638 m), weight 75.8 kg, SpO2 91 %.  Medical Problem List and Plan: 1. Functional deficits secondary to impacted subcapital right hip fracture after fall.  Status post ORIF 02/02/2022.  Weightbearing as tolerated.  -patient may  shower- with Aquacel dressing in place  -ELOS/Goals: `1-14 days Supervision to mod I 2.  Antithrombotics: -DVT/anticoagulation:  Pharmaceutical: Lovenox x6 weeks versus transition to Xarelto at discharge.  Check vascular study  -antiplatelet therapy: N/A 3. Pain Management: Hydrocodone as needed- encouraged pt to take for therapy.  4. Mood: Wellbutrin 300 mg daily, Adderall 30 mg twice daily, melatonin 3 mg nightly  -antipsychotic agents: N/A 5. Neuropsych: This patient is capable of making decisions on her own behalf. 6. Skin/Wound Care: Routine skin checks 7. Fluids/Electrolytes/Nutrition: Routine in and out with follow-up chemistries 8.  Leukocytosis.  Empiric Rocephin.  Urinalysis negative urine culture pending. 9.  Hypertension.  Norvasc 10  mg daily, Cozaar 100 mg daily.  Monitor with increased mobility 10.  Hypothyroidism.  Synthroid 11.  Obesity.  BMI 28.23.  Dietary follow-up 12. R Rotator cuff tear- non  surgical - pt didn't want surgery right now- pain control for now  I have personally performed a face to face diagnostic evaluation of this patient and formulated the key components of the plan.  Additionally, I have personally reviewed laboratory data, imaging studies, as well as relevant notes and concur with the physician assistant's documentation above.   The patient's status has not changed from the original H&P.  Any changes in documentation from the acute care chart have been noted above.     Lavon Paganini Angiulli, PA-C 02/04/2022

## 2022-02-04 NOTE — Progress Notes (Signed)
Physical Therapy Treatment Patient Details Name: [name] MRN: [id number] DOB: [birth year] Today's Date: 02/04/2022   History of Present Illness Pt is a 81 y.o. F who presents with a right valgus impacted subcapital femoral neck fracture now s/p cannulated hip pinning. Imaging right shoulder showing High riding right humerus consistent with rotator cuff pathology/tear and severe glenohumeral degenerative changes. Significant PMH: HTN, osteopenia, anxiety/depression, GERD.    PT Comments    Pt received supine and agreeable to session with good progress with premedication and pain well controled. Pt able to come to standing and progress short distance ambulation with mod down to min assist +2 with RW to steady and for assist to power up. Pt able to step pivot to recliner with min assist and agreeable to time up in chair at end of session. Pt family present throughout session, encouraging and participatory. Current plan remains appropriate to address deficits and maximize functional independence and decrease caregiver burden. Pt continues to benefit from skilled PT services to progress toward functional mobility goals.     Recommendations for follow up therapy are one component of a multi-disciplinary discharge planning process, led by the attending physician.  Recommendations may be updated based on patient status, additional functional criteria and insurance authorization.  Follow Up Recommendations  Acute inpatient rehab (3hours/day)     Assistance Recommended at Discharge Frequent or constant Supervision/Assistance  Patient can return home with the following Two people to help with walking and/or transfers;A lot of help with bathing/dressing/bathroom   Equipment Recommendations  Rolling walker (2 wheels);BSC/3in1;Wheelchair (measurements PT);Wheelchair cushion (measurements PT)    Recommendations for Other Services Rehab consult     Precautions / Restrictions  Precautions Precautions: Fall Restrictions Weight Bearing Restrictions: Yes RLE Weight Bearing: Weight bearing as tolerated     Mobility  Bed Mobility Overal bed mobility: Needs Assistance Bed Mobility: Supine to Sit     Supine to sit: Mod assist, HOB elevated Sit to supine: Mod assist   General bed mobility comments: assist to advance R LE (with pt using gait belt as leg lifter) and to lift trunk with cues to pull on bedrail    Transfers Overall transfer level: Needs assistance Equipment used: Rolling walker (2 wheels) Transfers: Sit to/from Stand, Bed to chair/wheelchair/BSC Sit to Stand: Mod assist   Step pivot transfers: Min assist, +2 safety/equipment       General transfer comment: Initially Mod A to stand progressing to Min A during session with cues for R LE placement, hand placement with DME use and cues for posture. Pt able to pivot to recliner after gait training with minor assist for RW manuevering and cues for sequencing.    Ambulation/Gait Ambulation/Gait assistance: Mod assist Gait Distance (Feet): 5 Feet Assistive device: Rolling walker (2 wheels) Gait Pattern/deviations: Step-to pattern, Decreased stride length, Shuffle, Antalgic Gait velocity: decr     General Gait Details: very slow antalgic gait, difficulty advancing RLE scooting foot forward and heel-toe when stepping sideways   Stairs             Wheelchair Mobility    Modified Rankin (Stroke Patients Only)       Balance Overall balance assessment: Needs assistance Sitting-balance support: Feet supported Sitting balance-Leahy Scale: Fair     Standing balance support: Bilateral upper extremity supported, During functional activity, Reliant on assistive device for balance Standing balance-Leahy Scale: Poor  Cognition Arousal/Alertness: Awake/alert Behavior During Therapy: WFL for tasks assessed/performed Overall Cognitive Status:  Impaired/Different from baseline Area of Impairment: Memory, Problem solving                     Memory: Decreased short-term memory       Problem Solving: Requires verbal cues General Comments: repetition of directions likely needed due to [facility]        Exercises General Exercises - Lower Extremity Hip Flexion/Marching: AROM, AAROM, Right, 10 reps, Standing    General Comments General comments (skin integrity, edema, etc.): Family at bedside, supportive      Pertinent Vitals/Pain Pain Assessment Pain Assessment: Faces Faces Pain Scale: Hurts little more Pain Location: R hip Pain Descriptors / Indicators: Grimacing, Operative site guarding Pain Intervention(s): Limited activity within patient's tolerance, Monitored during session, Premedicated before session, Repositioned    Home Living Family/patient expects to be discharged to:: Private residence Living Arrangements: Spouse/significant other Available Help at Discharge: Family Type of Home: House Home Access: Stairs to enter   Technical brewer of Steps: 1   Home Layout: One level Home Equipment: Cane - single point      Prior Function            PT Goals (current goals can now be found in the care plan section) Acute Rehab PT Goals Patient Stated Goal: less pain PT Goal Formulation: With patient/family Time For Goal Achievement: 02/17/22    Frequency    Min 5X/week      PT Plan      Co-evaluation PT/OT/SLP Co-Evaluation/Treatment: Yes Reason for Co-Treatment: For patient/therapist safety;To address functional/ADL transfers PT goals addressed during session: Mobility/safety with mobility OT goals addressed during session: ADL's and self-care      AM-PAC PT "6 Clicks" Mobility   Outcome Measure  Help needed turning from your back to your side while in a flat bed without using bedrails?: A Lot Help needed moving from lying on your back to sitting on the side of a flat bed without  using bedrails?: A Lot Help needed moving to and from a bed to a chair (including a wheelchair)?: Total Help needed standing up from a chair using your arms (e.g., wheelchair or bedside chair)?: A Lot Help needed to walk in hospital room?: A Lot Help needed climbing 3-5 steps with a railing? : Total 6 Click Score: 10    End of Session Equipment Utilized During Treatment: Gait belt Activity Tolerance: Patient tolerated treatment well Patient left: with family/visitor present;in chair;with call bell/phone within reach Nurse Communication: Mobility status PT Visit Diagnosis: Pain;Difficulty in walking, not elsewhere classified (R26.2);Other abnormalities of gait and mobility (R26.89) Pain - Right/Left: Right Pain - part of body: Hip;Shoulder     Time: 1112-1140 PT Time Calculation (min) (ACUTE ONLY): 28 min  Charges:  $Gait Training: 8-22 mins                     Aycen Porreca R. PTA Acute Rehabilitation Services Office: [place] 02/04/2022, 12:18 PM

## 2022-02-04 NOTE — Discharge Summary (Signed)
Physician Discharge Summary   Patient: [name] MRN: [id number] DOB: [birth year]  Admit date:     02/01/2022  Discharge date: 02/04/22  Discharge Physician: Hosie Poisson   PCP: Celene Squibb, MD   Recommendations at discharge:  Please follow up with PCp in one week Please follow up with neurology as outpatient for dementia work up .  Please follow up with orthopedics as recommended.  Please follow up with outpatient palliative care  Please check cbc and bMP in one week.  Discharge Diagnoses: Principal Problem:   Femoral fracture ([place]) Active Problems:   Hyperlipidemia   Essential hypertension   GERD (gastroesophageal reflux disease)   Obesity (BMI 30-39.9)   Hypothyroidism, postop   Hospital Course:  [name] is a 81 y.o. female with medical history significant of hypertension, hypothyroidism, osteopenia, anxiety/depression, GERD.  Patient seen after a mechanical fall .The patient who stepping into garage and missed step, falling on her right side.  She had pain and difficulty walking.  She was brought to the hospital by EMS.  X-rays show a right subcapital femoral fracture. Orthopedics consulted and underwent surgical repair. Therapy eval recommending SNF.    Assessment and Plan:  Right sub capital femoral fracture:  S/p cannulated hip pinning by orthopedics on 5/21 Pain control.  Physical therapy eval recommending CIR.  Lovenox for 6 weeks for dvt prophylaxis.  Weight bearing as tolerated , no restrictions.  Follow up with [facility] in 2 weeks.      Hypertension;  BP parameters are optimal.  Continue with losartan, norvasc, .  Please hold the HCTZ for now.      Hypothyroidism:  Continue with synthroid.      Anxiety and depression:  Resume wellbutrin XL.      GERD Stable.      Leukocytosis:  Improving, possibly from UTI.    Abnormal UA with increased frequency.  Get urine cultures, started the patient on augmentin.  Follow the  cultures.     Bronchitis , wheezing on exam.   CXR shows peribronchial cuffing.  No pneumonia. Pt is on RA.  Started her on augmentin and duonebs prn.         Consultants: palliative care  Orthopedics.  Procedures performed: surgical repair the right hip  Disposition: Rehabilitation facility Diet recommendation:  Discharge Diet Orders (From admission, onward)     Start     Ordered   02/04/22 0000  Diet - low sodium heart healthy        02/04/22 1143           Regular diet DISCHARGE MEDICATION: Allergies as of 02/04/2022   No Known Allergies      Medication List     STOP taking these medications    aspirin-acetaminophen-caffeine 250-250-65 MG tablet Commonly known as: EXCEDRIN MIGRAINE   hydrochlorothiazide 12.5 MG capsule Commonly known as: MICROZIDE       TAKE these medications    amLODipine 10 MG tablet Commonly known as: NORVASC Take 1 tablet (10 mg total) by mouth daily.   amoxicillin-clavulanate 875-125 MG tablet Commonly known as: AUGMENTIN Take 1 tablet by mouth every 12 (twelve) hours for 5 days.   amphetamine-dextroamphetamine 30 MG tablet Commonly known as: Adderall Take 1 tablet by mouth 2 (two) times daily. Fill in 2 months   buPROPion 300 MG 24 hr tablet Commonly known as: WELLBUTRIN XL TAKE 1 TABLET BY MOUTH ONCE DAILY   diclofenac sodium 1 % Gel Commonly known as: Voltaren APPLY  TOPICALLY TO AFFECTED AREA 4 TIMES DAILY AS NEEDED FOR PAIN What changed:  how much to take how to take this when to take this reasons to take this additional instructions   docusate sodium 100 MG capsule Commonly known as: COLACE Take 1 capsule (100 mg total) by mouth 2 (two) times daily.   enoxaparin 40 MG/0.4ML injection Commonly known as: LOVENOX Inject 0.4 mLs (40 mg total) into the skin daily.   HYDROcodone-acetaminophen 5-325 MG tablet Commonly known as: NORCO/VICODIN Take 1-2 tablets by mouth every 4 (four) hours as needed for  moderate pain or severe pain.   ipratropium-albuterol 0.5-2.5 (3) MG/3ML Soln Commonly known as: DUONEB Take 3 mLs by nebulization every 6 (six) hours as needed.   levothyroxine 50 MCG tablet Commonly known as: SYNTHROID Take 50 mcg by mouth daily.   losartan 100 MG tablet Commonly known as: COZAAR Take 100 mg by mouth daily. TAKE 1 TABLET BY MOUTH ONCE DAILY.   melatonin 3 MG Tabs tablet Take 1 tablet (3 mg total) by mouth at bedtime.   multivitamin capsule Take 1 capsule by mouth daily. Centrum Silver   mupirocin ointment 2 % Commonly known as: BACTROBAN Place into the nose 2 (two) times daily.   OVER THE COUNTER MEDICATION Take 1 tablet by mouth daily. Joint Support tablet includes:  Primal Celadrine turmeric   Polyethyl Glycol-Propyl Glycol 0.4-0.3 % Soln Place 2 drops into both eyes 2 (two) times daily.   senna 8.6 MG Tabs tablet Commonly known as: SENOKOT Take 1 tablet (8.6 mg total) by mouth 2 (two) times daily.        Follow-up Information     Georgeanna Harrison, MD. Schedule an appointment as soon as possible for a visit in 2 week(s).   Specialty: Orthopedic Surgery Contact information: [address] [place] [address] [phone]         Celene Squibb, MD. Schedule an appointment as soon as possible for a visit in 1 week(s).   Specialty: Internal Medicine Contact information: [place] [address] [phone]                Discharge Exam: Filed Weights   02/01/22 1512 02/02/22 0646  Weight: 75.8 kg 75.8 kg   General exam: Appears calm and comfortable  Respiratory system: scattered exp wheezing. On RA, air entry fair.  Cardiovascular system: S1 & S2 heard, RRR. No JVD, murmurs, rubs, gallops or clicks. No pedal edema. Gastrointestinal system: Abdomen is nondistended, soft and nontender.Normal bowel sounds heard. Central nervous system: Alert and oriented to place and person. Grossly non focal.   Extremities: Symmetric 5 x 5 power. Skin: No rashes, lesions or ulcers Psychiatry: Mood & affect appropriate.    Condition at discharge: fair  The results of significant diagnostics from this hospitalization (including imaging, microbiology, ancillary and laboratory) are listed below for reference.   Imaging Studies: DG Shoulder Right  Result Date: 02/01/2022 CLINICAL DATA:  Pain after fall EXAM: RIGHT SHOULDER - 2+ VIEW COMPARISON:  Chest x-ray September 13, 2020 FINDINGS: The right humerus is high riding consistent with rotator cuff pathology/tear. Irregularity of the radial head is favored to be chronic. No convincing evidence of acute fracture. Glenohumeral degenerative changes are identified. AC joint degenerative changes are identified. No other acute abnormalities. No dislocation. IMPRESSION: 1. High riding right humerus consistent with rotator cuff pathology/tear. 2. Irregularity and flattening of the humeral head is favored to be nonacute. No convincing evidence of acute  fracture. No dislocation. 3. Severe glenohumeral degenerative changes with significant loss of joint space. Electronically Signed   By: Dorise Bullion III M.D.   On: 02/01/2022 16:31   DG Elbow Complete Right  Result Date: 02/01/2022 CLINICAL DATA:  Pain after fall EXAM: RIGHT ELBOW - COMPLETE 3+ VIEW COMPARISON:  None Available. FINDINGS: There is no evidence of fracture, dislocation, or joint effusion. There is no evidence of arthropathy or other focal bone abnormality. Soft tissues are unremarkable. IMPRESSION: Negative. Electronically Signed   By: Dorise Bullion III M.D.   On: 02/01/2022 16:34   DG CHEST PORT 1 VIEW  Result Date: 02/04/2022 CLINICAL DATA:  32[address]; wheezing EXAM: PORTABLE CHEST 1 VIEW COMPARISON:  Chest radiograph dated September 13, 2020 FINDINGS: The cardiomediastinal silhouette is unchanged in contour.RIGHT-sided calcified granuloma. Mild peribronchial cuffing. No pleural effusion. No  pneumothorax. Visualized abdomen is unremarkable. Advanced degenerative changes of the RIGHT shoulder. Status post LEFT shoulder arthroplasty. IMPRESSION: Peribronchial cuffing as can be seen in small airways disease or bronchitis. Electronically Signed   By: Valentino Saxon M.D.   On: 02/04/2022 10:50   DG C-Arm 1-60 Min-No Report  Result Date: 02/02/2022 Fluoroscopy was utilized by the requesting physician.  No radiographic interpretation.   DG HIP UNILAT WITH PELVIS 2-3 VIEWS RIGHT  Result Date: 02/02/2022 CLINICAL DATA:  ORIF RIGHT hip fracture. EXAM: DG HIP (WITH OR WITHOUT PELVIS) 3V RIGHT COMPARISON:  02/01/2022 radiographs FINDINGS: Intraoperative spot views of the RIGHT hip are submitted postoperatively for interpretation. 3 surgical screws are noted traversing the RIGHT femoral neck fracture which appears in near anatomic alignment and position. No gross complicating features are noted. IMPRESSION: Internal fixation of RIGHT femoral neck fracture. Electronically Signed   By: Margarette Canada M.D.   On: 02/02/2022 10:17   DG Hip Unilat With Pelvis 2-3 Views Right  Result Date: 02/01/2022 CLINICAL DATA:  Pain after fall EXAM: DG HIP (WITH OR WITHOUT PELVIS) 2-3V RIGHT COMPARISON:  None Available. FINDINGS: The patient is status post left hip replacement. Visualized portions of the left hip hardware are in good position. The pelvic bones are intact. There is a mildly impacted subcapital fracture in the right hip. No dislocation. No other acute abnormalities. IMPRESSION: Mildly impacted subcapital right hip fracture. Electronically Signed   By: Dorise Bullion III M.D.   On: 02/01/2022 16:32   DG Knee AP/LAT W/Sunrise Left  Result Date: 02/02/2022 CLINICAL DATA:  Pain during hip procedure. EXAM: LEFT KNEE 3 VIEWS COMPARISON:  None Available. FINDINGS: Degenerative changes in the lateral compartment with mild loss of joint space. No fractures, dislocations, or effusions, or other cause for pain  identified. IMPRESSION: Lateral compartment degenerative changes. Electronically Signed   By: Dorise Bullion III M.D.   On: 02/02/2022 13:19    Microbiology: Results for orders placed or performed during the hospital encounter of 02/01/22  Surgical pcr screen     Status: Abnormal   Collection Time: 02/02/22  6:32 AM   Specimen: Nasal Mucosa; Nasal Swab  Result Value Ref Range Status   MRSA, PCR NEGATIVE NEGATIVE Final   Staphylococcus aureus POSITIVE (A) NEGATIVE Final    Comment: (NOTE) The Xpert SA Assay (FDA approved for NASAL specimens in patients 30 years of age and older), is one component of a comprehensive surveillance program. It is not intended to diagnose infection nor to guide or monitor treatment. Performed at [facility], [place] [address] [place], [place] [address]     Labs: CBC: Recent  Labs  Lab 02/01/22 1733 02/02/22 1633 02/04/22 0926  WBC 14.4* 15.7* 12.3*  NEUTROABS 10.0*  --  7.4  HGB 13.8 13.2 11.1*  HCT 44.0 42.4 35.4*  MCV 88.9 90.2 89.6  PLT 383 292 098   Basic Metabolic Panel: Recent Labs  Lab 02/01/22 1733 02/02/22 1633 02/04/22 0926  NA 140  --  136  K 3.6  --  3.5  CL 105  --  105  CO2 27  --  27  GLUCOSE 98  --  103*  BUN 18  --  9  CREATININE 0.98 1.02* 0.88  CALCIUM 9.6  --  8.4*   Liver Function Tests: No results for input(s): AST, ALT, ALKPHOS, BILITOT, PROT, ALBUMIN in the last 168 hours. CBG: No results for input(s): GLUCAP in the last 168 hours.  Discharge time spent: 38 minutes.   Signed: Hosie Poisson, MD [facility] 02/04/2022

## 2022-02-04 NOTE — PMR Pre-admission (Signed)
PMR Admission Coordinator Pre-Admission Assessment   Patient: [name] is an 81 y.o., female MRN: [id number] DOB: 07/15/1941 Height: 5' 4.49" (163.8 cm) Weight: 75.8 kg   Insurance Information HMO:     PPO:     PCP:      IPA:      80/20:      OTHER:  PRIMARY: Medicare A/B      Policy#: [id number]      Subscriber: pt CM Name:       Phone#:      Fax#:  Pre-Cert#: verified online      Employer:  Benefits:  Phone #:      Name:  Eff. Date: A 12/14/04, B 06/16/11     Deduct: $1600       Out of Pocket Max: n/a      Life Max: n/a CIR: 100%      SNF: 20 full days Outpatient: 80%     Co-Ins: 20% Home Health: 100%      Co-Pay:  DME: 80%     Co-Ins: 20% Providers:  SECONDARY: BCBS      Policy#: [id number]     Phone#: [phone]   Financial Counselor:       Phone#:    The "Data Collection Information Summary" for patients in Inpatient Rehabilitation Facilities with attached "Privacy Act Statement-Health Care Records" was provided and verbally reviewed with: Patient and Family   Emergency Contact Information Contact Information       Name Relation Home Work Mobile    Dannemiller,Marvin Spouse     [phone]    Hutchins, Eric Son     [phone]           Current Medical History  Patient Admitting Diagnosis: R hip fracture, R rotator cuff tear   History of Present Illness: [name] is an 81-year-old right-handed female with history of hypertension, hypothyroidism, anxiety/depression, osteopenia.  Presented 02/01/2022 after mechanical fall without loss of consciousness.  The patient was stepping into the garage and missed a step, falling on her right side.  X-rays and imaging revealed mildly impacted subcapital right hip fracture.  Also noted chronic RUE rotator cuff tear, likely exacerbated by fall.  Underwent open treatment of right valgus impacted subcapital femoral neck fracture with internal fixation 02/02/2022 per Dr. Austin Looney.  Weightbearing as tolerated right lower  extremity.  Placed on Lovenox for DVT prophylaxis x6 weeks versus transition to Xarelto at discharge.  Plan to keep Aquacel on and dry for 14 days for wound care.  Leukocytosis 15,700 placed on Rocephin empirically with urinalysis completed negative nitrite.  Therapy evaluations completed due to patient decreased functional mobility was admitted for a comprehensive rehab program.   Patient's medical record from [place] has been reviewed by the rehabilitation admission coordinator and physician.   Past Medical History      Past Medical History:  Diagnosis Date   ALLERGIC RHINITIS 11/30/2008   ALLERGIC RHINITIS 11/30/2008   Anxiety     ASTHMA UNSPECIFIED WITH EXACERBATION 02/17/2008    pt. states she does not and has never has asthma   CAROTID BRUIT, LEFT 04/19/2007   Closed fracture of lateral malleolus 06/04/2009   CLOSED FRACTURE OF METATARSAL BONE 06/04/2009   Depression     Esophageal reflux 11/08/2008   GANGLION CYST 04/19/2007   HYPERTENSION 04/07/2007   Hypothyroidism     INSOMNIA 08/07/2010   OSTEOARTHRITIS 04/07/2007   OSTEOPENIA 03/10/2008   PONV (postoperative nausea and vomiting)       UTI 07/20/2009      Has the patient had major surgery during 100 days prior to admission? Yes   Family History   family history includes Alcoholism in her father; Alzheimer's disease in her sister; COPD in her father; Cancer in her brother; Heart disease in her mother; Thyroid disease in her sister.   Current Medications   Current Facility-Administered Medications:    alum & mag hydroxide-simeth (MAALOX/MYLANTA) 200-200-20 MG/5ML suspension 30 mL, 30 mL, Oral, Q4H PRN, Akula, Vijaya, MD, 30 mL at 02/03/22 2227   amLODipine (NORVASC) tablet 10 mg, 10 mg, Oral, Daily, Stinson, Jacob J, DO, 10 mg at 02/04/22 0816   amphetamine-dextroamphetamine (ADDERALL) tablet 30 mg, 30 mg, Oral, BID WC, Stinson, Jacob J, DO, 30 mg at 02/04/22 0707   buPROPion (WELLBUTRIN XL) 24 hr tablet 300 mg, 300 mg, Oral, Daily,  Stinson, Jacob J, DO, 300 mg at 02/04/22 0816   [START ON 02/05/2022] cefTRIAXone (ROCEPHIN) 1 g in sodium chloride 0.9 % 100 mL IVPB, 1 g, Intravenous, Q24H, Akula, Vijaya, MD   docusate sodium (COLACE) capsule 100 mg, 100 mg, Oral, BID, Looney, Austin, MD, 100 mg at 02/04/22 0816   enoxaparin (LOVENOX) injection 40 mg, 40 mg, Subcutaneous, Q24H, Looney, Austin, MD, 40 mg at 02/04/22 0817   HYDROcodone-acetaminophen (NORCO/VICODIN) 5-325 MG per tablet 1-2 tablet, 1-2 tablet, Oral, Q4H PRN, Looney, Austin, MD, 1 tablet at 02/03/22 2210   ipratropium-albuterol (DUONEB) 0.5-2.5 (3) MG/3ML nebulizer solution 3 mL, 3 mL, Nebulization, Once, Akula, Vijaya, MD   levothyroxine (SYNTHROID) tablet 50 mcg, 50 mcg, Oral, Q0600, Stinson, Jacob J, DO, 50 mcg at 02/04/22 0708   losartan (COZAAR) tablet 100 mg, 100 mg, Oral, Daily, Stinson, Jacob J, DO, 100 mg at 02/04/22 0816   melatonin tablet 3 mg, 3 mg, Oral, QHS, Daniels, James K, NP, 3 mg at 02/04/22 0105   menthol-cetylpyridinium (CEPACOL) lozenge 3 mg, 1 lozenge, Oral, PRN **OR** phenol (CHLORASEPTIC) mouth spray 1 spray, 1 spray, Mouth/Throat, PRN, Looney, Austin, MD   morphine (PF) 4 MG/ML injection 4 mg, 4 mg, Intravenous, Q4H PRN, Stinson, Jacob J, DO, 4 mg at 02/03/22 0628   mupirocin ointment (BACTROBAN) 2 %, , Nasal, BID, Akula, Vijaya, MD, Given at 02/04/22 1022   ondansetron (ZOFRAN) tablet 4 mg, 4 mg, Oral, Q6H PRN **OR** ondansetron (ZOFRAN) injection 4 mg, 4 mg, Intravenous, Q6H PRN, Stinson, Jacob J, DO, 4 mg at 02/02/22 1559   ondansetron (ZOFRAN) tablet 4 mg, 4 mg, Oral, Q6H PRN **OR** ondansetron (ZOFRAN) injection 4 mg, 4 mg, Intravenous, Q6H PRN, Looney, Austin, MD   polyvinyl alcohol (LIQUIFILM TEARS) 1.4 % ophthalmic solution 2 drop, 2 drop, Both Eyes, BID, Stinson, Jacob J, DO, 2 drop at 02/04/22 0821   senna (SENOKOT) tablet 8.6 mg, 1 tablet, Oral, BID, Stinson, Jacob J, DO, 8.6 mg at 02/04/22 0816   Patients Current Diet:  Diet Order                   Diet regular Room service appropriate? Yes; Fluid consistency: Thin  Diet effective now                         Precautions / Restrictions Precautions Precautions: Fall Restrictions Weight Bearing Restrictions: Yes RLE Weight Bearing: Weight bearing as tolerated    Has the patient had 2 or more falls or a fall with injury in the past year? Yes   Prior Activity Level Community (5-7x/wk): no DME at   baseline, not driving, but went out with spouse near daily, mod I with mobility/ADLs/IADLs   Prior Functional Level Self Care: Did the patient need help bathing, dressing, using the toilet or eating? Independent   Indoor Mobility: Did the patient need assistance with walking from room to room (with or without device)? Independent   Stairs: Did the patient need assistance with internal or external stairs (with or without device)? Independent   Functional Cognition: Did the patient need help planning regular tasks such as shopping or remembering to take medications? Independent   Patient Information Are you of Hispanic, Latino/a,or Spanish origin?: A. No, not of Hispanic, Latino/a, or Spanish origin What is your race?: A. White Do you need or want an interpreter to communicate with a doctor or health care staff?: 0. No   Patient's Response To:  Health Literacy and Transportation Is the patient able to respond to health literacy and transportation needs?: Yes Health Literacy - How often do you need to have someone help you when you read instructions, pamphlets, or other written material from your doctor or pharmacy?: Never In the past 12 months, has lack of transportation kept you from medical appointments or from getting medications?: No In the past 12 months, has lack of transportation kept you from meetings, work, or from getting things needed for daily living?: No   Home Assistive Devices / Equipment Home Assistive Devices/Equipment: None Home Equipment: Cane  - single point   Prior Device Use: Indicate devices/aids used by the patient prior to current illness, exacerbation or injury? None of the above   Current Functional Level Cognition   Overall Cognitive Status: Impaired/Different from baseline Orientation Level: Oriented X4 General Comments: STM deficits noted    Extremity Assessment (includes Sensation/Coordination)   Upper Extremity Assessment: Defer to OT evaluation  Lower Extremity Assessment: Generalized weakness, LLE deficits/detail LLE Deficits / Details: Grossly 1/5 except ankle dorsiflexion WFL     ADLs         Mobility   Overal bed mobility: Needs Assistance Bed Mobility: Supine to Sit, Sit to Supine Supine to sit: Mod assist Sit to supine: Mod assist General bed mobility comments: Heavy modA to progress to edge of bed, assist for RLE management and trunk to upright. Verbal cues for sequencing and initiation. Assist for BLE's back into bed     Transfers   Overall transfer level: Needs assistance Equipment used: Rolling walker (2 wheels), Ambulation equipment used Transfers: Sit to/from Stand Sit to Stand:  (unable) General transfer comment: Pt unable to stand with use of RW or Stedy, decreased ability to weight shift and weight bear through either LE     Ambulation / Gait / Stairs / Wheelchair Mobility         Posture / Balance Balance Overall balance assessment: Needs assistance Sitting-balance support: Feet supported Sitting balance-Leahy Scale: Fair     Special needs/care consideration Skin surgical incision to R hip, do not get wet x14 days    Previous Home Environment (from acute therapy documentation) Living Arrangements: Spouse/significant other Available Help at Discharge: Family Type of Home: House Home Layout: One level Home Access: Stairs to enter Entrance Stairs-Number of Steps: 1 (through garage) Bathroom Shower/Tub: Tub/shower unit, Walk-in shower Home Care Services: No   Discharge Living  Setting Plans for Discharge Living Setting: Patient's home, Lives with (comment) (spouse) Type of Home at Discharge: House Discharge Home Layout: One level Discharge Home Access: Stairs to enter Entrance Stairs-Rails: None Entrance Stairs-Number of Steps: 1 (in   garage) Discharge Bathroom Shower/Tub: Tub/shower unit, Walk-in shower Discharge Bathroom Toilet: Standard (has both standard and handicapped height) Discharge Bathroom Accessibility: Yes How Accessible: Accessible via walker Does the patient have any problems obtaining your medications?: No   Social/Family/Support Systems Patient Roles: Spouse Contact Information: Marvin Canter Anticipated Caregiver: Spouse, Marvin, with PRN assist from son and daughter in law Anticipated Caregiver's Contact Information: Marvin: [phone] Ability/Limitations of Caregiver: min assist Caregiver Availability: 24/7 Discharge Plan Discussed with Primary Caregiver: Yes Is Caregiver In Agreement with Plan?: Yes Does Caregiver/Family have Issues with Lodging/Transportation while Pt is in Rehab?: No   Goals Patient/Family Goal for Rehab: PT/OT supervision to mod I, SLP n/a Expected length of stay: 12-14 days Additional Information: pt with advanced RUE RC tear, likely exacerbated by fall but no intervention at this time. Pt/Family Agrees to Admission and willing to participate: Yes Program Orientation Provided & Reviewed with Pt/Caregiver Including Roles  & Responsibilities: Yes  Barriers to Discharge:  (pain)   Decrease burden of Care through IP rehab admission: n/a   Possible need for SNF placement upon discharge: No   Patient Condition: I have reviewed medical records from [place], spoken with CM, and patient and family member. I met with patient at the bedside for inpatient rehabilitation assessment.  Patient will benefit from ongoing PT and OT, can actively participate in 3 hours of therapy a day 5 days of the week, and can make  measurable gains during the admission.  Patient will also benefit from the coordinated team approach during an Inpatient Acute Rehabilitation admission.  The patient will receive intensive therapy as well as Rehabilitation physician, nursing, social worker, and care management interventions.  Due to safety, skin/wound care, medication administration, pain management, and patient education the patient requires 24 hour a day rehabilitation nursing.  The patient is currently mod to max assist +2 with mobility and basic ADLs.  Discharge setting and therapy post discharge at home with home health is anticipated.  Patient has agreed to participate in the Acute Inpatient Rehabilitation Program and will admit today.   Preadmission Screen Completed By:  Caitlin E Warren, PT, DPT 02/04/2022 10:27 AM ______________________________________________________________________   Discussed status with Dr. Lavone Weisel on 02/04/22  at 10:38 AM  and received approval for admission today.   Admission Coordinator:  Caitlin E Warren, PT, DPT time 10:38 AM /Date 02/04/22     Assessment/Plan: Diagnosis: Does the need for close, 24 hr/day Medical supervision in concert with the patient's rehab needs make it unreasonable for this patient to be served in a less intensive setting? Yes Co-Morbidities requiring supervision/potential complications: R hip fx s/p pinning- WBAT; R RTC tear non op; HTN; osteopenia; hypothyroidism, anxiety/depression Due to bladder management, bowel management, safety, skin/wound care, disease management, medication administration, pain management, and patient education, does the patient require 24 hr/day rehab nursing? Yes Does the patient require coordinated care of a physician, rehab nurse, PT, OT, and SLP to address physical and functional deficits in the context of the above medical diagnosis(es)? Yes Addressing deficits in the following areas: balance, endurance, locomotion, strength, transferring,  bowel/bladder control, bathing, dressing, feeding, grooming, and toileting Can the patient actively participate in an intensive therapy program of at least 3 hrs of therapy 5 days a week? Yes The potential for patient to make measurable gains while on inpatient rehab is good Anticipated functional outcomes upon discharge from inpatient rehab: modified independent and supervision PT, modified independent and supervision OT, n/a SLP Estimated rehab length of stay to

## 2022-02-04 NOTE — Progress Notes (Signed)
Daily Progress Note   Patient Name: [name]       Date: 02/04/2022 DOB: Jul 09, 1941  Age: 81 y.o. MRN#: [id number] Attending Physician: Courtney Heys, MD Primary Care Physician: Celene Squibb, MD Admit Date: 02/04/2022  Reason for Consultation/Follow-up: Establishing goals of care  Subjective: Medical records reviewed. Patient assessed at the bedside. She is sleeping comfortably and did not attempt to arouse. No family present during my visit.  Called patient's husband and left VM with contact information for a return call.  PMT will continue to support holistically.   Length of Stay: 0   Physical Exam Vitals and nursing note reviewed.  Constitutional:      General: She is sleeping. She is not in acute distress. HENT:     Head: Normocephalic and atraumatic.  Cardiovascular:     Rate and Rhythm: Normal rate.  Pulmonary:     Effort: Pulmonary effort is normal. No respiratory distress.            Vital Signs: BP 125/83   Pulse 91   Temp 98.5 F (36.9 C) (Oral)   Resp 16   Ht '5\' 4"'$  (1.626 m)   Wt 75.8 kg   BMI 28.67 kg/m  SpO2:   O2 Device:   O2 Flow Rate:        Palliative Assessment/Data:     Palliative Care Assessment & Plan   Patient Profile: 81 y.o. female  with past medical history of hypertension, hypothyroidism, osteopenia, anxiety/depression, GERD admitted on 02/01/2022 with fall onto right side.    Patient is s/p right subcapital femoral fracture. PMT has been consulted to assist with goals of care conversation.    Assessment: right subcapital femoral fracture  Recommendations/Plan: Change code status to DNR per yesterday's goals of care conversation with patient and family Awaiting AD documentation to be brought in by patient's  husband Patient remains appropriate for outpatient palliative care follow up at d/c as discussed with family PMT will continue to follow. I will see for ongoing [place] discussions when I am back on service 5/25   Prognosis:  Unable to determine  Discharge Planning: To Be Determined    Total time: I spent 25 minutes in the care of the patient today in the above activities and documenting the encounter.   Michah Minton Johnnette Litter, PA-C  Palliative Medicine Team Team phone # [phone]  Thank you for allowing the Palliative Medicine Team to assist in the care of this patient. Please utilize secure chat with additional questions, if there is no response within 30 minutes please call the above phone number.  Palliative Medicine Team providers are available by phone from 7am to 7pm daily and can be reached through the team cell phone.  Should this patient require assistance outside of these hours, please call the patient's attending physician.

## 2022-02-04 NOTE — Progress Notes (Signed)
Patient to administer Lovenox, will assure she understands the procedure

## 2022-02-05 DIAGNOSIS — S72011A Unspecified intracapsular fracture of right femur, initial encounter for closed fracture: Secondary | ICD-10-CM | POA: Diagnosis not present

## 2022-02-05 LAB — COMPREHENSIVE METABOLIC PANEL
ALT: 6 U/L (ref 0–44)
AST: 17 U/L (ref 15–41)
Albumin: 2.6 g/dL — ABNORMAL LOW (ref 3.5–5.0)
Alkaline Phosphatase: 66 U/L (ref 38–126)
Anion gap: 6 (ref 5–15)
BUN: 7 mg/dL — ABNORMAL LOW (ref 8–23)
CO2: 26 mmol/L (ref 22–32)
Calcium: 8.7 mg/dL — ABNORMAL LOW (ref 8.9–10.3)
Chloride: 105 mmol/L (ref 98–111)
Creatinine, Ser: 0.79 mg/dL (ref 0.44–1.00)
GFR, Estimated: >60 mL/min (ref >60)
Glucose, Bld: 101 mg/dL — ABNORMAL HIGH (ref 70–99)
Potassium: 3.2 mmol/L — ABNORMAL LOW (ref 3.5–5.1)
Sodium: 137 mmol/L (ref 135–145)
Total Bilirubin: 0.5 mg/dL (ref 0.3–1.2)
Total Protein: 5.5 g/dL — ABNORMAL LOW (ref 6.5–8.1)

## 2022-02-05 LAB — CBC WITH DIFFERENTIAL/PLATELET
Abs Immature Granulocytes: 0.03 10*3/uL (ref 0.00–0.07)
Basophils Absolute: 0.1 10*3/uL (ref 0.0–0.1)
Basophils Relative: 1 %
Eosinophils Absolute: 1 10*3/uL — ABNORMAL HIGH (ref 0.0–0.5)
Eosinophils Relative: 11 %
HCT: 33.6 % — ABNORMAL LOW (ref 36.0–46.0)
Hemoglobin: 10.8 g/dL — ABNORMAL LOW (ref 12.0–15.0)
Immature Granulocytes: 0 %
Lymphocytes Relative: 25 %
Lymphs Abs: 2.5 10*3/uL (ref 0.7–4.0)
MCH: 28.2 pg (ref 26.0–34.0)
MCHC: 32.1 g/dL (ref 30.0–36.0)
MCV: 87.7 fL (ref 80.0–100.0)
Monocytes Absolute: 0.9 10*3/uL (ref 0.1–1.0)
Monocytes Relative: 9 %
Neutro Abs: 5.2 10*3/uL (ref 1.7–7.7)
Neutrophils Relative %: 54 %
Platelets: 267 10*3/uL (ref 150–400)
RBC: 3.83 MIL/uL — ABNORMAL LOW (ref 3.87–5.11)
RDW: 16 % — ABNORMAL HIGH (ref 11.5–15.5)
WBC: 9.8 10*3/uL (ref 4.0–10.5)
nRBC: 0 % (ref 0.0–0.2)

## 2022-02-05 LAB — URINE CULTURE
Culture: NO GROWTH
Special Requests: NORMAL

## 2022-02-05 MED ORDER — AMPHETAMINE-DEXTROAMPHETAMINE 10 MG PO TABS
30.0000 mg | ORAL_TABLET | Freq: Two times a day (BID) | ORAL | Status: DC
  Administered 2022-02-05 – 2022-02-19 (×29): 30 mg via ORAL
  Filled 2022-02-05 (×29): qty 3

## 2022-02-05 MED ORDER — POTASSIUM CHLORIDE CRYS ER 20 MEQ PO TBCR
40.0000 meq | EXTENDED_RELEASE_TABLET | ORAL | Status: AC
  Administered 2022-02-05 (×2): 40 meq via ORAL
  Filled 2022-02-05 (×2): qty 2

## 2022-02-05 MED ORDER — MAGNESIUM HYDROXIDE 400 MG/5ML PO SUSP
30.0000 mL | Freq: Once | ORAL | Status: AC
  Administered 2022-02-05: 30 mL via ORAL
  Filled 2022-02-05: qty 30

## 2022-02-05 NOTE — Progress Notes (Signed)
Physical Therapy Session Note  Patient Details  Name: NYSA SARIN MRN: [id number] Date of Birth: 11-Aug-1941  Today's Date: 02/05/2022 PT Individual Time: 1340-1503 PT Individual Time Calculation (min): 83 min   Short Term Goals: Week 1:  PT Short Term Goal 1 (Week 1): Pt will complete bed mobility with minA PT Short Term Goal 2 (Week 1): Pt will complete bed<>chair transfers with minA and LRAD PT Short Term Goal 3 (Week 1): Pt will ambulate 28f with minA and LRAD PT Short Term Goal 4 (Week 1): Pt will initiate stair training  Skilled Therapeutic Interventions/Progress Updates: Pt presented in bed agreeable to therapy. Pt states no pain in bed, did indicate increase in pain with mobility but did not rate and no intervention requested. Pt agreeable to start with supine and seated therex to "loosen up" leg. Pt participated in ankle pumps, QS, AA heel slides, hip abd/add 2 x 10 with PTA providing max cues for breathing through activitiy as well as performing controlled movement. Pt then began bed mobility requiring minA and increased time with use of bed features. At EOB pt performed LAQ x 10 but unable to come to full knee extension. Pt performed stand pivot transfer with modA with pt initially requiring cues to pick up feet vs shuffling or moving in toe/heel shifting pattern. In w/c pt moved to sink and performed oral hygiene and brushed hair per pt's request. Pt then transported to ortho gym and performed car transfer requiring overall modA for sequencing and RLE management. From car pt was able to ambulate with RW ~110fwith minA. Pt noted to have antalgic gait and intermittently lean to R. Pt then transported to day room and participated in Cybex Kinetron initially at 70cm/sec x 2 min then increased to 60cm/sec x 5 min with pt able to incrementally increase range of RLE. Pt then propelled from Kinetron to 4W nsg station with supervision and increased time. Pt transported remaining distance and  performed ambulatory transfer to bed with RW. Pt was able to perform stand from w/c with minA however required increased cues to clear foot due to increased pain and fatigue. At EOB performed sit to supine with light modA for LE management. Pt repositioned to comfort and left with bed alarm on, call bell within reach and niece Renee present.      Therapy Documentation Precautions:  Precautions Precautions: Fall Precaution Comments: Hard of hearing Restrictions Weight Bearing Restrictions: Yes RLE Weight Bearing: Weight bearing as tolerated General:   Vital Signs: Therapy Vitals Temp: 97.7 F (36.5 C) Pulse Rate: 82 Resp: 16 BP: 135/78 Patient Position (if appropriate): Lying Oxygen Therapy SpO2: 93 % O2 Device: Room Air Pain: Pain Assessment Pain Scale: 0-10 Pain Score: 2  Pain Location: Leg Pain Orientation: Right Pain Descriptors / Indicators: Aching;Operative site guarding Pain Onset: With Activity Pain Intervention(s): RN made aware;Repositioned;Rest;Elevated extremity Multiple Pain Sites: No  Exercises:   Other Treatments:      Therapy/Group: Individual Therapy  Falisha Osment 02/05/2022, 3:57 PM

## 2022-02-05 NOTE — Progress Notes (Signed)
MOM given as ordered, encouraged fluids. Not effective at this time.

## 2022-02-05 NOTE — Evaluation (Incomplete)
Recreational Therapy Assessment and Plan  Patient Details  Name: [name] MRN: [id number] Date of Birth: [birth year] Today's Date: 02/05/2022  Rehab Potential:  Good ELOS:   10 days  Assessment Hospital Problem: Principal Problem:   Closed subcapital fracture of neck of right femur [facility])     Past Medical History:      Past Medical History:  Diagnosis Date   ALLERGIC RHINITIS 11/30/2008   ALLERGIC RHINITIS 11/30/2008   Anxiety     ASTHMA UNSPECIFIED WITH EXACERBATION 02/17/2008    pt. states she does not and has never has asthma   CAROTID BRUIT, LEFT 04/19/2007   Closed fracture of lateral malleolus 06/04/2009   CLOSED FRACTURE OF METATARSAL BONE 06/04/2009   Depression     Esophageal reflux 11/08/2008   GANGLION CYST 04/19/2007   HYPERTENSION 04/07/2007   Hypothyroidism     INSOMNIA 08/07/2010   OSTEOARTHRITIS 04/07/2007   OSTEOPENIA 03/10/2008   PONV (postoperative nausea and vomiting)     UTI 07/20/2009    Past Surgical History:       Past Surgical History:  Procedure Laterality Date   EYE SURGERY Bilateral      cataract removal   FUNCTIONAL ENDOSCOPIC SINUS SURGERY   01/2018    spinal fluid leaking behind right eye   HIP PINNING,CANNULATED Right 02/02/2022    Procedure: CANNULATED HIP PINNING;  Surgeon: Georgeanna Harrison, MD;  Location: [place];  Service: Orthopedics;  Laterality: Right;   JOINT REPLACEMENT        knuckle   REVERSE SHOULDER ARTHROPLASTY Left 09/09/2018   REVERSE SHOULDER ARTHROPLASTY Left 09/09/2018    Procedure: REVERSE SHOULDER ARTHROPLASTY;  Surgeon: Justice Britain, MD;  Location: [place];  Service: Orthopedics;  Laterality: Left;  160mn   TOTAL HIP ARTHROPLASTY       TUBAL LIGATION          Assessment & Plan Clinical Impression: Pam HECKMANNis an 81year old right-handed female with history of hypertension, hypothyroidism, anxiety/depression, osteopenia.  Per chart review lives with spouse.  1 level home one-step to entry.  Independent prior to  admission with occasional use of a single-point cane.  Presented 02/01/2022 after mechanical fall without loss of consciousness.  The patient was stepping into the garage and missed a step falling on her right side.  X-rays and imaging revealed mildly impacted subcapital right hip fracture.  Underwent open treatment of right valgus impacted subcapital femoral neck fracture with internal fixation 02/02/2022 per Dr. AGeorgeanna Harrison  Weightbearing as tolerated right lower extremity.  Patient also found to have R rotator cuff tear- non operative at this time- Placed on Lovenox for DVT prophylaxis x6 weeks versus transition to Xarelto at discharge.  Plan to keep Aquacel on and dry for 14 days for wound care.  Leukocytosis 15,700 placed on Rocephin empirically with urinalysis completed negative nitrite.  Therapy evaluations completed due to patient decreased functional mobility was admitted for a comprehensive rehab program. Patient transferred to CIR on 02/04/2022 .     Pt presents with decreased activity tolerance, decreased functional mobility, decreased balance, feelings of stress Limiting pt's independence with leisure/community pursuits.  Met with pt today to discuss TR services including leisure education, activity analysis/modifications and stress management.  Also discussed the importance of social, emotional, spiritual health in addition to physical health and their effects on overall health and wellness.  Pt stated understanding.    Plan  Min 1 TR session >20 minutes during LOS  Recommendations for other services: Neuropsych  Discharge Criteria: Patient will be discharged from TR if patient refuses treatment 3 consecutive times without medical reason.  If treatment goals not met, if there is a change in medical status, if patient makes no progress towards goals or if patient is discharged from hospital.  The above assessment, treatment plan, treatment alternatives and goals were discussed and  mutually agreed upon: by patient  [place] 02/05/2022, 2:56 PM

## 2022-02-05 NOTE — Evaluation (Signed)
Physical Therapy Assessment and Plan  Patient Details  Name: [name] MRN: [id number] Date of Birth: April 27, 1941  PT Diagnosis: Abnormality of gait, Difficulty walking, Muscle weakness, and Pain in R hip Rehab Potential: Good ELOS: 10-14 days   Today's Date: 02/05/2022 PT Individual Time: 0800-0900 PT Individual Time Calculation (min): 60 min    Hospital Problem: Principal Problem:   Closed subcapital fracture of neck of right femur [facility])   Past Medical History:  Past Medical History:  Diagnosis Date   ALLERGIC RHINITIS 11/30/2008   ALLERGIC RHINITIS 11/30/2008   Anxiety    ASTHMA UNSPECIFIED WITH EXACERBATION 02/17/2008   pt. states she does not and has never has asthma   CAROTID BRUIT, LEFT 04/19/2007   Closed fracture of lateral malleolus 06/04/2009   CLOSED FRACTURE OF METATARSAL BONE 06/04/2009   Depression    Esophageal reflux 11/08/2008   GANGLION CYST 04/19/2007   HYPERTENSION 04/07/2007   Hypothyroidism    INSOMNIA 08/07/2010   OSTEOARTHRITIS 04/07/2007   OSTEOPENIA 03/10/2008   PONV (postoperative nausea and vomiting)    UTI 07/20/2009   Past Surgical History:  Past Surgical History:  Procedure Laterality Date   EYE SURGERY Bilateral    cataract removal   FUNCTIONAL ENDOSCOPIC SINUS SURGERY  01/2018   spinal fluid leaking behind right eye   HIP PINNING,CANNULATED Right 02/02/2022   Procedure: CANNULATED HIP PINNING;  Surgeon: Georgeanna Harrison, MD;  Location: [place];  Service: Orthopedics;  Laterality: Right;   JOINT REPLACEMENT     knuckle   REVERSE SHOULDER ARTHROPLASTY Left 09/09/2018   REVERSE SHOULDER ARTHROPLASTY Left 09/09/2018   Procedure: REVERSE SHOULDER ARTHROPLASTY;  Surgeon: Justice Britain, MD;  Location: [place];  Service: Orthopedics;  Laterality: Left;  176mn   TOTAL HIP ARTHROPLASTY     TUBAL LIGATION      Assessment & Plan Clinical Impression: Patient is an 81year old right-handed female with history of hypertension, hypothyroidism,  anxiety/depression, osteopenia.  Per chart review lives with spouse.  1 level home one-step to entry.  Independent prior to admission with occasional use of a single-point cane.  Presented 02/01/2022 after mechanical fall without loss of consciousness.  The patient was stepping into the garage and missed a step falling on her right side.  X-rays and imaging revealed mildly impacted subcapital right hip fracture.  Underwent open treatment of right valgus impacted subcapital femoral neck fracture with internal fixation 02/02/2022 per Dr. AGeorgeanna Harrison  Weightbearing as tolerated right lower extremity.  Patient also found to have R rotator cuff tear- non operative at this time- Placed on Lovenox for DVT prophylaxis x6 weeks versus transition to Xarelto at discharge.  Plan to keep Aquacel on and dry for 14 days for wound care.  Leukocytosis 15,700 placed on Rocephin empirically with urinalysis completed negative nitrite.  Therapy evaluations completed due to patient decreased functional mobility was admitted for a comprehensive rehab program. Patient transferred to CIR on 02/04/2022 .   Patient currently requires mod with mobility secondary to muscle weakness, decreased cardiorespiratoy endurance, decreased problem solving, and decreased standing balance and decreased balance strategies.  Prior to hospitalization, patient was independent  with mobility and lived with her husband  in a House home.  Home access is 1Stairs to enter.  Patient will benefit from skilled PT intervention to maximize safe functional mobility, minimize fall risk, and decrease caregiver burden for planned discharge home with 24 hour supervision.  Anticipate patient will benefit [place].  PT - End  of Session Activity Tolerance: Tolerates < 10 min activity with changes in vital signs Endurance Deficit: Yes Endurance Deficit Description: Requires brief seated rest breaks b/w functional mobility tasks PT  Assessment Rehab Potential (ACUTE/IP ONLY): Good PT Barriers to Discharge: Home environment access/layout;Insurance for SNF coverage;Weight PT Patient demonstrates impairments in the following area(s): Balance;Endurance;Motor;Pain;Safety PT Transfers Functional Problem(s): Bed Mobility;Bed to Chair;Car PT Locomotion Functional Problem(s): Ambulation;Stairs;Wheelchair Mobility PT Plan PT Intensity: Minimum of 1-2 x/day ,45 to 90 minutes PT Frequency: 5 out of 7 days PT Duration Estimated Length of Stay: 10-14 days PT Treatment/Interventions: Ambulation/gait training;Discharge planning;Functional mobility training;Psychosocial support;Therapeutic Activities;Visual/perceptual remediation/compensation;Wheelchair propulsion/positioning;Therapeutic Exercise;Skin care/wound management;Neuromuscular re-education;Disease management/prevention;Balance/vestibular training;Cognitive remediation/compensation;DME/adaptive equipment instruction;Pain management;Splinting/orthotics;UE/LE Strength taining/ROM;UE/LE Coordination activities;Stair training;Patient/family education;Functional electrical stimulation;Community reintegration PT Transfers Anticipated Outcome(s): Supervision PT Locomotion Anticipated Outcome(s): Supervision PT Recommendation Follow Up Recommendations: 24 hour supervision/assistance Patient destination: Home Equipment Recommended: To be determined   PT Evaluation Precautions/Restrictions Precautions Precautions: Fall Precaution Comments: Hard of hearing Restrictions Weight Bearing Restrictions: Yes RLE Weight Bearing: Weight bearing as tolerated Pain Pain Assessment Pain Score: 2  Pain Interference Pain Interference Pain Effect on Sleep: 2. Occasionally Pain Interference with Therapy Activities: 4. Almost constantly Pain Interference with Day-to-Day Activities: 4. Almost constantly Home Living/Prior Functioning Home Living Available Help at Discharge: Family Type of Home:  House Home Access: Stairs to enter [employer] of Steps: 1 Entrance Stairs-Rails: None Home Layout: One level Prior Function Level of Independence: Independent with gait;Independent with basic ADLs;Independent with transfers;Independent with homemaking with ambulation  Able to Take Stairs?: Yes Driving: Yes Vocation: Retired Vision/Perception  Vision - History Ability to See in Adequate Light: 0 Adequate Perception Perception: Within Functional Limits Praxis Praxis: Intact  Cognition Overall Cognitive Status: Within Functional Limits for tasks assessed Arousal/Alertness: Awake/alert Orientation Level: Oriented X4 Memory: Appears intact Awareness: Appears intact Problem Solving: Appears intact Safety/Judgment: Appears intact Sensation Sensation Light Touch: Appears Intact Hot/Cold: Appears Intact Proprioception: Appears Intact Stereognosis: Appears Intact Coordination Gross Motor Movements are Fluid and Coordinated: No Coordination and Movement Description: Limited by pain and weakness Motor  Motor Motor: Other (comment) Motor - Skilled Clinical Observations: Generalized weakness and deconditioning 2/2 hip fx   Trunk/Postural Assessment  Cervical Assessment Cervical Assessment: Exceptions to [facility] (forward head) Thoracic Assessment Thoracic Assessment: Exceptions to [facility] (rounded shulders) Lumbar Assessment Lumbar Assessment: Exceptions to [facility] (posterior pelvic tilt) Postural Control Postural Control: Within Functional Limits  Balance Balance Balance Assessed: Yes Static Sitting Balance Static Sitting - Balance Support: Feet supported;No upper extremity supported Static Sitting - Level of Assistance: 5: Stand by assistance Dynamic Sitting Balance Dynamic Sitting - Balance Support: Feet supported;No upper extremity supported Dynamic Sitting - Level of Assistance: 4: Min Insurance risk surveyor Standing - Balance Support: Bilateral upper  extremity supported Static Standing - Level of Assistance: 4: Min assist Dynamic Standing Balance Dynamic Standing - Balance Support: During functional activity;Bilateral upper extremity supported Dynamic Standing - Level of Assistance: 3: Mod assist Extremity Assessment      RLE Assessment RLE Assessment: Exceptions to [facility] General Strength Comments: Limited by pain from hip fx. Grossly 3+/5 LLE Assessment LLE Assessment: Within Functional Limits  Care Tool Care Tool Bed Mobility Roll left and right activity        Sit to lying activity   Sit to lying assist level: Moderate Assistance - Patient 50 - 74%    Lying to sitting on side of bed activity   Lying to sitting on side of bed assist level: the  ability to move from lying on the back to sitting on the side of the bed with no back support.: Moderate Assistance - Patient 50 - 74%     Care Tool Transfers Sit to stand transfer   Sit to stand assist level: Moderate Assistance - Patient 50 - 74%    Chair/bed transfer   Chair/bed transfer assist level: Moderate Assistance - Patient 50 - 74%     Psychologist, counselling transfer activity did not occur: Safety/medical concerns        Care Tool Locomotion Ambulation   Assist level: Minimal Assistance - Patient > 75% Assistive device: Parallel bars Max distance: 39f  Walk 10 feet activity Walk 10 feet activity did not occur: Safety/medical concerns       Walk 50 feet with 2 turns activity Walk 50 feet with 2 turns activity did not occur: Safety/medical concerns      Walk 150 feet activity Walk 150 feet activity did not occur: Safety/medical concerns      Walk 10 feet on uneven surfaces activity Walk 10 feet on uneven surfaces activity did not occur: Safety/medical concerns      Stairs Stair activity did not occur: Safety/medical concerns        Walk up/down 1 step activity Walk up/down 1 step or curb (drop down) activity did not occur:  Safety/medical concerns      Walk up/down 4 steps activity Walk up/down 4 steps activity did not occur: Safety/medical concerns      Walk up/down 12 steps activity Walk up/down 12 steps activity did not occur: Safety/medical concerns      Pick up small objects from floor Pick up small object from the floor (from standing position) activity did not occur: Safety/medical concerns      Wheelchair Is the patient using a wheelchair?: Yes Type of Wheelchair: Manual   Wheelchair assist level: Supervision/Verbal cueing Max wheelchair distance: 335f Wheel 50 feet with 2 turns activity Wheelchair 50 feet with 2 turns activity did not occur: Safety/medical concerns    Wheel 150 feet activity Wheelchair 150 feet activity did not occur: Safety/medical concerns      Refer to Care Plan for Long Term Goals  SHORT TERM GOAL WEEK 1 PT Short Term Goal 1 (Week 1): Pt will complete bed mobility with minA PT Short Term Goal 2 (Week 1): Pt will complete bed<>chair transfers with minA and LRAD PT Short Term Goal 3 (Week 1): Pt will ambulate 503fith minA and LRAD PT Short Term Goal 4 (Week 1): Pt will initiate stair training  Recommendations for other services: None   Skilled Therapeutic Intervention Mobility Bed Mobility Bed Mobility: Supine to Sit Supine to Sit: Moderate Assistance - Patient 50-74% Transfers Transfers: Sit to Stand;Stand Pivot Transfers;Stand to Sit Sit to Stand: Moderate Assistance - Patient 50-74% Stand to Sit: Minimal Assistance - Patient > 75% Stand Pivot Transfers: Minimal Assistance - Patient > 75%;Moderate Assistance - Patient 50 - 74% Stand Pivot Transfer Details: Verbal cues for technique;Verbal cues for sequencing;Verbal cues for safe use of DME/AE;Verbal cues for gait pattern;Verbal cues for precautions/safety;Visual cues/gestures for precautions/safety;Tactile cues for posture;Manual facilitation for weight shifting;Manual facilitation for placement Transfer  (Assistive device): Rolling walker Locomotion  Gait Ambulation: Yes Gait Assistance: Minimal Assistance - Patient > 75% Gait Distance (Feet): 8 Feet Assistive device: Parallel bars Gait Assistance Details: Verbal cues for technique;Verbal cues for sequencing;Visual cues/gestures for sequencing;Verbal cues for gait pattern;Verbal cues  for precautions/safety;Visual cues/gestures for precautions/safety;Tactile cues for posture;Tactile cues for sequencing;Manual facilitation for weight shifting Gait Gait: Yes Gait Pattern: Impaired Gait Pattern: Step-to pattern;Antalgic;Trunk flexed;Lateral trunk lean to right Stairs / Additional Locomotion Stairs: No   Discharge Criteria: Patient will be discharged from PT if patient refuses treatment 3 consecutive times without medical reason, if treatment goals not met, if there is a change in medical status, if patient makes no progress towards goals or if patient is discharged from hospital.  The above assessment, treatment plan, treatment alternatives and goals were discussed and mutually agreed upon: by patient  Alger Simons PT, DPT 02/05/2022, 9:02 AM

## 2022-02-05 NOTE — Progress Notes (Signed)
Inpatient Rehabilitation  Patient information reviewed and entered into eRehab system by [place] M. Zakyah Yanes, M.A., CCC/SLP, PPS Coordinator.  Information including medical coding, functional ability and quality indicators will be reviewed and updated through discharge.    

## 2022-02-05 NOTE — Progress Notes (Signed)
PROGRESS NOTE   Subjective/Complaints: Pt reports no BM yet- was last going on Saturday.  Got sorbitol but no results yet- No urge to go yet either.   Depressed about her fracture and RTC tear and feeling "Silly" for falling.   ROS:  Pt denies SOB, abd pain, CP, N/V/ (+)C/D, and vision changes   Objective:   DG CHEST PORT 1 VIEW  Result Date: 02/04/2022 CLINICAL DATA:  76720; wheezing EXAM: PORTABLE CHEST 1 VIEW COMPARISON:  Chest radiograph dated September 13, 2020 FINDINGS: The cardiomediastinal silhouette is unchanged in contour.RIGHT-sided calcified granuloma. Mild peribronchial cuffing. No pleural effusion. No pneumothorax. Visualized abdomen is unremarkable. Advanced degenerative changes of the RIGHT shoulder. Status post LEFT shoulder arthroplasty. IMPRESSION: Peribronchial cuffing as can be seen in small airways disease or bronchitis. Electronically Signed   By: Valentino Saxon M.D.   On: 02/04/2022 10:50   VAS [place] LOWER EXTREMITY VENOUS (DVT)  Result Date: 02/04/2022  Lower Venous DVT Study Patient Name:  [name]  Date of Exam:   02/04/2022 Medical Rec #: [id number]         Accession #:    [id number] Date of Birth: 1941-09-10         Patient Gender: F Patient Age:   81 years Exam Location:  [facility] Procedure:      VAS [place] LOWER EXTREMITY VENOUS (DVT) Referring Phys: Lauraine Rinne --------------------------------------------------------------------------------  Indications: Swelling, s/p fall and surgery.  Risk Factors: Surgery 02-02-2022 Cannulated right hip pinning. Limitations: Patient pain. Comparison Study: No prior studies. Performing Technologist: Darlin Coco RDMS, RVT  Examination Guidelines: A complete evaluation includes B-mode imaging, spectral Doppler, color Doppler, and power Doppler as needed of all accessible portions of each vessel. Bilateral testing is considered an integral part of a  complete examination. Limited examinations for reoccurring indications may be performed as noted. The reflux portion of the exam is performed with the patient in reverse Trendelenburg.  +---------+---------------+---------+-----------+----------+--------------+ RIGHT    CompressibilityPhasicitySpontaneityPropertiesThrombus Aging +---------+---------------+---------+-----------+----------+--------------+ CFV      Full           Yes      Yes                                 +---------+---------------+---------+-----------+----------+--------------+ SFJ      Full                                                        +---------+---------------+---------+-----------+----------+--------------+ FV Prox  Full                                                        +---------+---------------+---------+-----------+----------+--------------+ FV Mid   Full                                                        +---------+---------------+---------+-----------+----------+--------------+  FV DistalFull                                                        +---------+---------------+---------+-----------+----------+--------------+ PFV      Full                                                        +---------+---------------+---------+-----------+----------+--------------+ POP      Full           Yes      Yes                                 +---------+---------------+---------+-----------+----------+--------------+ PTV      Full                                                        +---------+---------------+---------+-----------+----------+--------------+ PERO     Full                                                        +---------+---------------+---------+-----------+----------+--------------+ Gastroc  Full                                                        +---------+---------------+---------+-----------+----------+--------------+    +---------+---------------+---------+-----------+----------+--------------+ LEFT     CompressibilityPhasicitySpontaneityPropertiesThrombus Aging +---------+---------------+---------+-----------+----------+--------------+ CFV      Full           Yes      Yes                                 +---------+---------------+---------+-----------+----------+--------------+ SFJ      Full                                                        +---------+---------------+---------+-----------+----------+--------------+ FV Prox  Full                                                        +---------+---------------+---------+-----------+----------+--------------+ FV Mid   Full                                                        +---------+---------------+---------+-----------+----------+--------------+  FV DistalFull                                                        +---------+---------------+---------+-----------+----------+--------------+ PFV      Full                                                        +---------+---------------+---------+-----------+----------+--------------+ POP      Full           Yes      Yes                                 +---------+---------------+---------+-----------+----------+--------------+ PTV      Full                                                        +---------+---------------+---------+-----------+----------+--------------+ PERO     Full                                                        +---------+---------------+---------+-----------+----------+--------------+ Gastroc  Full                                                        +---------+---------------+---------+-----------+----------+--------------+     Summary: RIGHT: - There is no evidence of deep vein thrombosis in the lower extremity.  - No cystic structure found in the popliteal fossa.  LEFT: - There is no evidence of deep vein thrombosis in  the lower extremity.  - No cystic structure found in the popliteal fossa.  *See table(s) above for measurements and observations. Electronically signed by Servando Snare MD on 02/04/2022 at 5:29:14 PM.    Final    Recent Labs    02/04/22 0926 02/05/22 0519  WBC 12.3* 9.8  HGB 11.1* 10.8*  HCT 35.4* 33.6*  PLT 264 267   Recent Labs    02/04/22 0926 02/05/22 0519  NA 136 137  K 3.5 3.2*  CL 105 105  CO2 27 26  GLUCOSE 103* 101*  BUN 9 7*  CREATININE 0.88 0.79  CALCIUM 8.4* 8.7*    Intake/Output Summary (Last 24 hours) at 02/05/2022 1916 Last data filed at 02/05/2022 1239 Gross per 24 hour  Intake 240 ml  Output --  Net 240 ml        Physical Exam: Vital Signs Blood pressure 135/78, pulse 82, temperature 97.7 F (36.5 C), resp. rate 16, height '5\' 4"'$  (1.626 m), weight 75.8 kg, SpO2 93 %.   General: awake, alert, appropriate, laying in bed; depressed affect; NAD HENT: conjugate gaze; oropharynx moist CV: regular rate; no JVD  Pulmonary: CTA B/L; no W/R/R- good air movement GI: soft, NT, a little distended; hypoactive BS Psychiatric: appropriate but depressed affect Neurological: Ox3 Musculoskeletal:     Cervical back: Neck supple. No tenderness.     Comments: LUE 5/5 RUE- deltoid 2-/5- can only lift arm to 20-30 degrees abduction of flexion Otherwise 5-/5 in RUE- cannot participate in empty can test- obvious RTC tear LLE- 5/5 RLE- HF 2-/5; otherwise 5/5 in RLE  Skin:    General: Skin is warm and dry.     Comments: Right hip incision with Aquacel dressing in place. Dressing coming off- but incision glued- looks great mild surround erythema IV in RUE_ looks OK- but will remove  Neurological:     General: No focal deficit present.     Mental Status: She is alert and oriented to person, place, and time.     Comments: Patient is alert.  No acute distress.  Oriented x3 and follows commands.  Assessment/Plan: 1. Functional deficits which require 3+ hours per day of  interdisciplinary therapy in a comprehensive inpatient rehab setting. Physiatrist is providing close team supervision and 24 hour management of active medical problems listed below. Physiatrist and rehab team continue to assess barriers to discharge/monitor patient progress toward functional and medical goals  Care Tool:  Bathing              Bathing assist Assist Level: Maximal Assistance - Patient 24 - 49%     Upper Body Dressing/Undressing Upper body dressing   What is the patient wearing?: Hospital gown only    Upper body assist Assist Level: Maximal Assistance - Patient 25 - 49%    Lower Body Dressing/Undressing Lower body dressing            Lower body assist Assist for lower body dressing: Maximal Assistance - Patient 25 - 49%     Toileting Toileting    Toileting assist Assist for toileting: Moderate Assistance - Patient 50 - 74%     Transfers Chair/bed transfer  Transfers assist  Chair/bed transfer activity did not occur: Safety/medical concerns  Chair/bed transfer assist level: Moderate Assistance - Patient 50 - 74%     Locomotion Ambulation   Ambulation assist      Assist level: Minimal Assistance - Patient > 75% Assistive device: Parallel bars Max distance: 29f   Walk 10 feet activity   Assist  Walk 10 feet activity did not occur: Safety/medical concerns        Walk 50 feet activity   Assist Walk 50 feet with 2 turns activity did not occur: Safety/medical concerns         Walk 150 feet activity   Assist Walk 150 feet activity did not occur: Safety/medical concerns         Walk 10 feet on uneven surface  activity   Assist Walk 10 feet on uneven surfaces activity did not occur: Safety/medical concerns         Wheelchair     Assist Is the patient using a wheelchair?: Yes Type of Wheelchair: Manual    Wheelchair assist level: Supervision/Verbal cueing Max wheelchair distance: 369f   Wheelchair 50 feet  with 2 turns activity    Assist    Wheelchair 50 feet with 2 turns activity did not occur: Safety/medical concerns       Wheelchair 150 feet activity     Assist  Wheelchair 150 feet activity did not occur: Safety/medical concerns       Blood pressure 135/78,  pulse 82, temperature 97.7 F (36.5 C), resp. rate 16, height '5\' 4"'$  (8.921 m), weight 75.8 kg, SpO2 93 %.  Medical Problem List and Plan: 1. Functional deficits secondary to impacted subcapital right hip fracture after fall.  Status post ORIF 02/02/2022.  Weightbearing as tolerated.             -patient may  shower- with Aquacel dressing in place             -ELOS/Goals: `1-14 days Supervision to mod I  Con't CIR- PT and OT_ first days of evaluations 2.  Antithrombotics: -DVT/anticoagulation:  Pharmaceutical: Lovenox x6 weeks versus transition to Xarelto at discharge.  Check vascular study             -antiplatelet therapy: N/A 3. Pain Management: Hydrocodone as needed- encouraged pt to take for therapy.   5/24- pain adequate- con't regimen 4. Mood: Wellbutrin 300 mg daily, Adderall 30 mg twice daily, melatonin 3 mg nightly  5/24- pt more depressed but reluctant to change meds right now- will d/w pt if doesn't improve             -antipsychotic agents: N/A 5. Neuropsych: This patient is capable of making decisions on her own behalf. 6. Skin/Wound Care: Routine skin checks 7. Fluids/Electrolytes/Nutrition: Routine in and out with follow-up chemistries 8.  Leukocytosis.  Empiric Rocephin.  Urinalysis negative urine culture pending. 9.  Hypertension.  Norvasc 10 mg daily, Cozaar 100 mg daily.  Monitor with increased mobility  5/24- BP controlled- con't regimen 10.  Hypothyroidism.  Synthroid 11.  Obesity.  BMI 28.23.  Dietary follow-up 12. R Rotator cuff tear- non surgical - pt didn't want surgery right now- pain control for now 13. Constipation- will give Sorbitol now for BM tonight hopefully.    5/24- give MOM- see  if can get results- if none by tomorrow, will check KUB    LOS: 1 days A FACE TO FACE EVALUATION WAS PERFORMED  Hersel Mcmeen 02/05/2022, 7:16 PM

## 2022-02-05 NOTE — Evaluation (Signed)
Occupational Therapy Assessment and Plan  Patient Details  Name: [name] MRN: [id number] Date of Birth: August 21, 1941  OT Diagnosis: abnormal posture, acute pain, and muscle weakness (generalized) Rehab Potential: Rehab Potential (ACUTE ONLY): Good ELOS: 10-14 days   Today's Date: 02/05/2022 OT Individual Time: 9675-9163 OT Individual Time Calculation (min): 75 min     Hospital Problem: Principal Problem:   Closed subcapital fracture of neck of right femur [facility])   Past Medical History:  Past Medical History:  Diagnosis Date   ALLERGIC RHINITIS 11/30/2008   ALLERGIC RHINITIS 11/30/2008   Anxiety    ASTHMA UNSPECIFIED WITH EXACERBATION 02/17/2008   pt. states she does not and has never has asthma   CAROTID BRUIT, LEFT 04/19/2007   Closed fracture of lateral malleolus 06/04/2009   CLOSED FRACTURE OF METATARSAL BONE 06/04/2009   Depression    Esophageal reflux 11/08/2008   GANGLION CYST 04/19/2007   HYPERTENSION 04/07/2007   Hypothyroidism    INSOMNIA 08/07/2010   OSTEOARTHRITIS 04/07/2007   OSTEOPENIA 03/10/2008   PONV (postoperative nausea and vomiting)    UTI 07/20/2009   Past Surgical History:  Past Surgical History:  Procedure Laterality Date   EYE SURGERY Bilateral    cataract removal   FUNCTIONAL ENDOSCOPIC SINUS SURGERY  01/2018   spinal fluid leaking behind right eye   HIP PINNING,CANNULATED Right 02/02/2022   Procedure: CANNULATED HIP PINNING;  Surgeon: Georgeanna Harrison, MD;  Location: [place];  Service: Orthopedics;  Laterality: Right;   JOINT REPLACEMENT     knuckle   REVERSE SHOULDER ARTHROPLASTY Left 09/09/2018   REVERSE SHOULDER ARTHROPLASTY Left 09/09/2018   Procedure: REVERSE SHOULDER ARTHROPLASTY;  Surgeon: Justice Britain, MD;  Location: [place];  Service: Orthopedics;  Laterality: Left;  183mn   TOTAL HIP ARTHROPLASTY     TUBAL LIGATION      Assessment & Plan Clinical Impression: Pam SHELLENBARGERis an 81year old right-handed female with history of  hypertension, hypothyroidism, anxiety/depression, osteopenia.  Per chart review lives with spouse.  1 level home one-step to entry.  Independent prior to admission with occasional use of a single-point cane.  Presented 02/01/2022 after mechanical fall without loss of consciousness.  The patient was stepping into the garage and missed a step falling on her right side.  X-rays and imaging revealed mildly impacted subcapital right hip fracture.  Underwent open treatment of right valgus impacted subcapital femoral neck fracture with internal fixation 02/02/2022 per Dr. AGeorgeanna Harrison  Weightbearing as tolerated right lower extremity.  Patient also found to have R rotator cuff tear- non operative at this time- Placed on Lovenox for DVT prophylaxis x6 weeks versus transition to Xarelto at discharge.  Plan to keep Aquacel on and dry for 14 days for wound care.  Leukocytosis 15,700 placed on Rocephin empirically with urinalysis completed negative nitrite.  Therapy evaluations completed due to patient decreased functional mobility was admitted for a comprehensive rehab program.     Pt reports no pain at rest, but pain 7-8/10 it appears when moving and "cannot stand pain"- Pain meds bring pain down to 0/10.  RUE "useless" since RTC tear, but doesn't want more surgery.  No BM since Saturday AM Peeing OK. Patient transferred to CIR on 02/04/2022 .    Patient currently requires max with basic self-care skills secondary to muscle weakness, decreased cardiorespiratoy endurance, decreased coordination, and decreased sitting balance, decreased standing balance, decreased postural control, and decreased balance strategies.  Prior to hospitalization, patient could complete ADLs and IADLs with independent .  Patient will benefit from skilled intervention to increase independence with basic self-care skills prior to discharge home with care partner.  Anticipate patient will require 24 hour supervision and minimal physical  assistance and follow up home health.  OT - End of Session Activity Tolerance: Tolerates 30+ min activity with multiple rests Endurance Deficit: Yes Endurance Deficit Description: Requires brief seated rest breaks b/w functional mobility tasks OT Assessment Rehab Potential (ACUTE ONLY): Good OT Barriers to Discharge: Other (comments) OT Barriers to Discharge Comments: pain and nausea limiting pts tolerance to therapies OT Patient demonstrates impairments in the following area(s): Endurance;Motor;Pain;Skin Integrity OT Basic ADL's Functional Problem(s): Bathing;Toileting;Dressing OT Transfers Functional Problem(s): Toilet;Tub/Shower OT Plan OT Intensity: Minimum of 1-2 x/day, 45 to 90 minutes OT Frequency: 5 out of 7 days OT Duration/Estimated Length of Stay: 10-14 days OT Treatment/Interventions: Balance/vestibular training;Discharge planning;Pain management;Self Care/advanced ADL retraining;Therapeutic Activities;UE/LE Coordination activities;Functional mobility training;Disease mangement/prevention;Patient/family education;Skin care/wound managment;Therapeutic Exercise;Community reintegration;DME/adaptive equipment instruction;Neuromuscular re-education;Psychosocial support;UE/LE Strength taining/ROM OT Basic Self-Care Anticipated Outcome(s): min assist LB; supervision UB and transfers OT Toileting Anticipated Outcome(s): supervision OT Bathroom Transfers Anticipated Outcome(s): supervision OT Recommendation Patient destination: Home Follow Up Recommendations: 24 hour supervision/assistance;Home health OT Equipment Recommended: To be determined   OT Evaluation Precautions/Restrictions  Precautions Precautions: Fall Precaution Comments: Hard of hearing Restrictions Weight Bearing Restrictions: Yes RLE Weight Bearing: Weight bearing as tolerated General Chart Reviewed: Yes Vital Signs  Pain Pain Assessment Pain Scale: 0-10 Pain Score: 9  Pain Location: Leg Pain  Orientation: Right Pain Descriptors / Indicators: Aching;Operative site guarding Pain Onset: With Activity Pain Intervention(s): RN made aware;Repositioned;Rest;Elevated extremity Multiple Pain Sites: No Home Living/Prior [place] expects to be discharged to:: Private residence Living Arrangements: Spouse/significant other Available Help at Discharge: Family, Available 24 hours/day Type of Home: House Home Access: Stairs to enter Technical brewer of Steps: 2 Entrance Stairs-Rails: None Home Layout: One level Bathroom Shower/Tub: Chiropodist: Standard  Lives With: Spouse Prior Function Level of Independence: Independent with gait, Independent with basic ADLs, Independent with transfers, Independent with homemaking with ambulation  Able to Take Stairs?: Yes Driving: Yes Vocation: Retired Surveyor, mining Baseline Vision/History: 1 Wears glasses Ability to See in Adequate Light: 0 Adequate Patient Visual Report: No change from baseline Vision Assessment?: No apparent visual deficits Perception  Perception: Within Functional Limits Praxis Praxis: Intact Cognition Cognition Overall Cognitive Status: Within Functional Limits for tasks assessed Arousal/Alertness: Awake/alert Orientation Level: Place;Situation;Person Person: Oriented Place: Oriented Situation: Oriented Memory: Appears intact Awareness: Appears intact Problem Solving: Appears intact Safety/Judgment: Appears intact Brief Interview for Mental Status (BIMS) Repetition of Three Words (First Attempt): 3 Temporal Orientation: Year: Correct Temporal Orientation: Month: Accurate within 5 days Temporal Orientation: Day: Correct Recall: "Sock": Yes, no cue required Recall: "Blue": Yes, no cue required Recall: "Bed": Yes, no cue required BIMS Summary Score: 15 Sensation Sensation Light Touch: Appears Intact Hot/Cold: Appears Intact Proprioception: Appears  Intact Stereognosis: Appears Intact Coordination Gross Motor Movements are Fluid and Coordinated: No Fine Motor Movements are Fluid and Coordinated: Yes Coordination and Movement Description: Limited by pain and weakness Motor  Motor Motor: Other (comment) Motor - Skilled Clinical Observations: Generalized weakness and deconditioning 2/2 hip fx  Trunk/Postural Assessment  Cervical Assessment Cervical Assessment: Exceptions to [facility] (forward head) Thoracic Assessment Thoracic Assessment: Exceptions to [facility] (rounded shoulders) Lumbar Assessment Lumbar Assessment: Exceptions to [facility] (posterior pelvic tilt) Postural Control Postural Control: Within Functional Limits  Balance Balance Balance Assessed: Yes Static Sitting Balance Static Sitting - Balance Support:  Feet supported;No upper extremity supported Static Sitting - Level of Assistance: 5: Stand by assistance Dynamic Sitting Balance Dynamic Sitting - Balance Support: Feet supported;No upper extremity supported Dynamic Sitting - Level of Assistance: 4: Min Insurance risk surveyor Standing - Balance Support: Bilateral upper extremity supported Static Standing - Level of Assistance: 4: Min assist Dynamic Standing Balance Dynamic Standing - Balance Support: During functional activity;Bilateral upper extremity supported Dynamic Standing - Level of Assistance: 3: Mod assist Extremity/Trunk Assessment RUE Assessment RUE Assessment: Within Functional Limits (pt exhibits baseline crepitus and limited P/AROM right shoulder) LUE Assessment LUE Assessment: Within Functional Limits  Care Tool Care Tool Self Care Eating   Eating Assist Level: Set up assist    Oral Care    Oral Care Assist Level: Set up assist    Bathing         Assist Level: Maximal Assistance - Patient 24 - 49%    Upper Body Dressing(including orthotics)       Assist Level: Maximal Assistance - Patient 25 - 49%    Lower Body Dressing (excluding  footwear)     Assist for lower body dressing: Maximal Assistance - Patient 25 - 49%    Putting on/Taking off footwear     Assist for footwear: Maximal Assistance - Patient 25 - 49%       Care Tool Toileting Toileting activity   Assist for toileting: Moderate Assistance - Patient 50 - 74%     Care Tool Bed Mobility Roll left and right activity        Sit to lying activity   Sit to lying assist level: Moderate Assistance - Patient 50 - 74%    Lying to sitting on side of bed activity   Lying to sitting on side of bed assist level: the ability to move from lying on the back to sitting on the side of the bed with no back support.: Moderate Assistance - Patient 50 - 74%     Care Tool Transfers Sit to stand transfer   Sit to stand assist level: Moderate Assistance - Patient 50 - 74%    Chair/bed transfer   Chair/bed transfer assist level: Moderate Assistance - Patient 50 - 74%     Toilet transfer   Assist Level: Minimal Assistance - Patient > 75%     Care Tool Cognition  Expression of Ideas and Wants Expression of Ideas and Wants: 4. Without difficulty (complex and basic) - expresses complex messages without difficulty and with speech that is clear and easy to understand  Understanding Verbal and Non-Verbal Content Understanding Verbal and Non-Verbal Content: 4. Understands (complex and basic) - clear comprehension without cues or repetitions   Memory/Recall Ability Memory/Recall Ability : Current season;That he or she is in a hospital/hospital unit   Refer to Care Plan for [place] 1 OT Short Term Goal 1 (Week 1): Pt will complete sit<>stand transfer with CGA OT Short Term Goal 2 (Week 1): Pt will complete stand pivot w/c<>BSC with CGA OT Short Term Goal 3 (Week 1): Pt will donn/doff pants with mod assist using AE as needed  Recommendations for other services: None    Skilled Therapeutic Intervention ADL ADL Eating: Set up Where  Assessed-Eating: Bed level Grooming: Setup Where Assessed-Grooming: Standing at sink Upper Body Bathing: Moderate assistance (post emesis) Where Assessed-Upper Body Bathing: Bed level Upper Body Dressing: Maximal assistance Where Assessed-Upper Body Dressing: Bed level Toileting: Moderate assistance Where Assessed-Toileting: Bedside Commode Toilet  Transfer: Minimal assistance Toilet Transfer Method: Stand pivot Mobility  Transfers Sit to Stand: Minimal Assistance - Patient > 75% Stand to Sit: Minimal Assistance - Patient > 75%   Discharge Criteria: Patient will be discharged from OT if patient refuses treatment 3 consecutive times without medical reason, if treatment goals not met, if there is a change in medical status, if patient makes no progress towards goals or if patient is discharged from hospital.  The above assessment, treatment plan, treatment alternatives and goals were discussed and mutually agreed upon: by patient  Ezekiel Slocumb 02/05/2022, 12:41 PM

## 2022-02-05 NOTE — Progress Notes (Signed)
Patient slept well last night except for occasionally waking up thinking it was time for therapy. Otherwise remains alert and oriented. Bathroom privileges as requested. Right hip surgical incision dressing intact. No drainage noted. Awaiting therapy evaluation this morning. IV rocephin therapy started last night through [facility]. Complains pain only when repositioned in bed but pain medications. VS stable. Safety maintained at all times.

## 2022-02-06 DIAGNOSIS — S72011A Unspecified intracapsular fracture of right femur, initial encounter for closed fracture: Secondary | ICD-10-CM | POA: Diagnosis not present

## 2022-02-06 LAB — BASIC METABOLIC PANEL
Anion gap: 6 (ref 5–15)
BUN: 7 mg/dL — ABNORMAL LOW (ref 8–23)
CO2: 29 mmol/L (ref 22–32)
Calcium: 9 mg/dL (ref 8.9–10.3)
Chloride: 103 mmol/L (ref 98–111)
Creatinine, Ser: 0.84 mg/dL (ref 0.44–1.00)
GFR, Estimated: >60 mL/min (ref >60)
Glucose, Bld: 106 mg/dL — ABNORMAL HIGH (ref 70–99)
Potassium: 4 mmol/L (ref 3.5–5.1)
Sodium: 138 mmol/L (ref 135–145)

## 2022-02-06 MED ORDER — ESCITALOPRAM OXALATE 10 MG PO TABS
5.0000 mg | ORAL_TABLET | Freq: Every day | ORAL | Status: DC
  Administered 2022-02-06 – 2022-02-11 (×6): 5 mg via ORAL
  Filled 2022-02-06 (×6): qty 1

## 2022-02-06 NOTE — Progress Notes (Signed)
Daily Progress Note   Patient Name: [name]       Date: 02/06/2022 DOB: 26-Dec-1940  Age: 81 y.o. MRN#: [id number] Attending Physician: Courtney Heys, MD Primary Care Physician: Celene Squibb, MD Admit Date: 02/04/2022  Reason for Consultation/Follow-up: Establishing goals of care  Subjective: Medical records reviewed. Patient assessed at the bedside. She is in good spirits visiting with her granddaughter.  Created space and opportunity for patient's thoughts and feelings in her current illness.  She continues to hope for as much improvement as possible and feels her mood has greatly improved lately.  She is still anxious about the potential for complications and decline, but appreciative of early involvement of palliative care support.  Discussed with granddaughter the role of palliative care for goals of care discussion and symptom management and she agrees this would be helpful.  Updated patient that I am still waiting to hear from her husband about advanced directives, hopefully he will bring them in soon.  She states that they may be in the green filing cabinet.  Offered to assist with completion and authorization if they are unable to find during this admission.  Called patient's husband and left VM with contact information for a return call.  PMT will continue to support holistically.   Length of Stay: 2  Physical Exam Vitals and nursing note reviewed.  Constitutional:      General: She is not in acute distress. HENT:     Head: Normocephalic and atraumatic.  Cardiovascular:     Rate and Rhythm: Normal rate.  Pulmonary:     Effort: Pulmonary effort is normal. No respiratory distress.  Skin:    General: Skin is warm and dry.  Neurological:     Mental Status: She is alert.   Psychiatric:        Mood and Affect: Mood normal.        Behavior: Behavior is cooperative.            Vital Signs: BP (!) 153/75 (BP Location: Right Arm)   Pulse 82   Temp 97.9 F (36.6 C)   Resp 18   Ht '5\' 4"'$  (1.626 m)   Wt 75.8 kg   SpO2 97%   BMI 28.67 kg/m  SpO2: SpO2: 97 % O2 Device: O2 Device: Room Air O2 Flow Rate:  Palliative Assessment/Data:     Palliative Care Assessment & Plan   Patient Profile: 81 y.o. female  with past medical history of hypertension, hypothyroidism, osteopenia, anxiety/depression, GERD admitted on 02/01/2022 with fall onto right side.    Patient is s/p right subcapital femoral fracture. PMT has been consulted to assist with goals of care conversation.    Assessment: right subcapital femoral fracture  Recommendations/Plan: DNR Awaiting AD documentation to be brought in by patient's husband, would benefit from completion if unable to find Patient remains appropriate for outpatient palliative care follow up at d/c as discussed with family PMT will continue to follow   Prognosis:  Unable to determine  Discharge Planning: To Be Determined    Total time: I spent 35 minutes in the care of the patient today in the above activities and documenting the encounter.   Keymon Mcelroy Johnnette Litter, PA-C  Palliative Medicine Team Team phone # [phone]  Thank you for allowing the Palliative Medicine Team to assist in the care of this patient. Please utilize secure chat with additional questions, if there is no response within 30 minutes please call the above phone number.  Palliative Medicine Team providers are available by phone from 7am to 7pm daily and can be reached through the team cell phone.  Should this patient require assistance outside of these hours, please call the patient's attending physician.

## 2022-02-06 NOTE — Progress Notes (Signed)
Physical Therapy Session Note  Patient Details  Name: [name] MRN: [id number] Date of Birth: 1940-09-24   Today's Date: 02/06/2022 PT Individual Time: 1309-1408 PT Individual Time Calculation (min): 59 min  Short Term Goals: Week 1:  PT Short Term Goal 1 (Week 1): Pt will complete bed mobility with minA PT Short Term Goal 2 (Week 1): Pt will complete bed<>chair transfers with minA and LRAD PT Short Term Goal 3 (Week 1): Pt will ambulate 46f with minA and LRAD PT Short Term Goal 4 (Week 1): Pt will initiate stair training  Skilled Therapeutic Interventions/Progress Updates: Pt presented in w/c agreeable to therapy. Pt denies pain at rest but increases with mobility. Pt requesting to use bathroom prior to leaving room. Pt transported to bathroom and performed stand pivot with RW and minA to elevated toilet. Pt requiring cues to attempt to pick up feet off ground vs shuffling. Pt was able to perform LB clothing management with CGA and once completed able to come to stand with minA and perform peri-care with CGA. Pt returned to w/c in same manner and moved to sink to perform hand and oral hygiene. Pt then transported to rehab gym for time management and performed step pivot transfer to high/low mat with minA. Participated in Sit to stand with RW x 5 with CGA nearing close supervision. Pt encouraged to place weight on RLE as initially pt placing minimal weight on RLE. Pt then performed toe taps to 4in step with RW and CGA. Pt initially requiring significantly increased time when attempting to lift LLE due to pain and decreased weight brearing on RLE. Pt with noted improvement on second bout. Pt then participated in alternating toe taps to target on level tile with more success of pt clearing floor with LLE. Participated in gait training/ambulation ~755fwith RW and CGA. Pt initially with step to pattern and minimally clearing LLE however improved with distance and pt progressing to step through  pattern near end of ambulation. Pt propelled partial distance back to room ~10033fith supervision for general conditioning. Pt transported remaining distance back to room and performed ambulatory transfer to bed. Pt required minA for sit to supine from EOB. Pt repositioned to comfort and left with bed alarm on, call bell within reach and needs met.      Therapy Documentation Precautions:  Precautions Precautions: Fall Precaution Comments: Hard of hearing Restrictions Weight Bearing Restrictions: Yes RLE Weight Bearing: Weight bearing as tolerated General:   Vital Signs:  Pain:   Mobility:   Locomotion :    Trunk/Postural Assessment :    Balance:   Exercises:   Other Treatments:      Therapy/Group: Individual Therapy  [name] 02/06/2022, 2:25 PM

## 2022-02-06 NOTE — Progress Notes (Signed)
Recreational Therapy Session Note  Patient Details  Name: [name] MRN: [id number] Date of Birth: 1941-08-09 Today's Date: 02/06/2022  Pain: no c/o Skilled Therapeutic Interventions/Progress Updates: Pt actively participated in stress managment/coping group today.  Pt education/discussion focused on stress exploration including factors that contribute to stress, factors that protect against stress and potential coping strategies.  Coping strategies included deep breathing, progressive muscle relaxation, imagery & challenging irrational thoughts.  Handouts provided.  Therapy/Group: Group Therapy  Saivion Goettel 02/06/2022, 10:22 AM

## 2022-02-06 NOTE — Progress Notes (Signed)
Inpatient Rehabilitation Care Coordinator Assessment and Plan Patient Details  Name: [name] MRN: [id number] Date of Birth: November 16, 1940  Today's Date: 02/06/2022  Hospital Problems: Principal Problem:   Closed subcapital fracture of neck of right femur [facility])  Past Medical History:  Past Medical History:  Diagnosis Date   ALLERGIC RHINITIS 11/30/2008   ALLERGIC RHINITIS 11/30/2008   Anxiety    ASTHMA UNSPECIFIED WITH EXACERBATION 02/17/2008   pt. states she does not and has never has asthma   CAROTID BRUIT, LEFT 04/19/2007   Closed fracture of lateral malleolus 06/04/2009   CLOSED FRACTURE OF METATARSAL BONE 06/04/2009   Depression    Esophageal reflux 11/08/2008   GANGLION CYST 04/19/2007   HYPERTENSION 04/07/2007   Hypothyroidism    INSOMNIA 08/07/2010   OSTEOARTHRITIS 04/07/2007   OSTEOPENIA 03/10/2008   PONV (postoperative nausea and vomiting)    UTI 07/20/2009   Past Surgical History:  Past Surgical History:  Procedure Laterality Date   EYE SURGERY Bilateral    cataract removal   FUNCTIONAL ENDOSCOPIC SINUS SURGERY  01/2018   spinal fluid leaking behind right eye   HIP PINNING,CANNULATED Right 02/02/2022   Procedure: CANNULATED HIP PINNING;  Surgeon: Georgeanna Harrison, MD;  Location: [place];  Service: Orthopedics;  Laterality: Right;   JOINT REPLACEMENT     knuckle   REVERSE SHOULDER ARTHROPLASTY Left 09/09/2018   REVERSE SHOULDER ARTHROPLASTY Left 09/09/2018   Procedure: REVERSE SHOULDER ARTHROPLASTY;  Surgeon: Justice Britain, MD;  Location: [place];  Service: Orthopedics;  Laterality: Left;  149mn   TOTAL HIP ARTHROPLASTY     TUBAL LIGATION     Social History:  reports that she quit smoking about 53 years ago. Her smoking use included cigarettes. She has a 30.00 pack-year smoking history. She has never used smokeless tobacco. She reports that she does not drink alcohol and does not use drugs.  Family / Support Systems Marital Status: Married How Long?: 574years Patient  Roles: Spouse, Parent Spouse/Significant Other: MMarchia Bond(husband) #[phone]: one adult son- ERandall HissOther Supports: PRN support from grandchildren, and neice Renae Anticipated Caregiver: husband Ability/Limitations of Caregiver: None reported Caregiver Availability: 24/7 Family Dynamics: Pt lives with her husband  Social History Preferred language: English Religion: [employer] Cultural Background: Pt worked as an iTheatre manageruntil retirement Education: some cMedical sales representative- How often do you need to have someone help you when you read instructions, pamphlets, or other written material from your doctor or pharmacy?: Never Writes: Yes Employment Status: Retired Date Retired/Disabled/Unemployed: 2000 Age Retired: 58 LPublic relations account executiveIssues: Denies Guardian/Conservator: N/A   Abuse/Neglect Abuse/Neglect Assessment Can Be Completed: Yes Physical Abuse: Denies Verbal Abuse: Denies Sexual Abuse: Denies Exploitation of patient/patient's resources: Denies Self-Neglect: Denies  Patient response to: Social Isolation - How often do you feel lonely or isolated from those around you?: Never  Emotional Status Pt's affect, behavior and adjustment status: Pt in good spirits at time of visit. Admits today that she is depressed. States nothing has has occurred, states it "just comes over me." Recent Psychosocial Issues: See above Psychiatric History: PCP prescribes meds for depression Substance Abuse History: Admits she quit smoking cigarettes in 1978. Denies etoh and rec drug use.  Patient / Family Perceptions, Expectations & Goals Pt/Family understanding of illness & functional limitations: Pt and family have a general understanding of pt care needs Premorbid pt/family roles/activities: Independent Anticipated changes in roles/activities/participation: Assistance with ADLs, IADLs Pt/family expectations/goals: Pt goal is to work on "weight, walking, car  transfers, and any therapy needed."  [employer]: None Premorbid Home Care/DME Agencies: None Transportation available at discharge: TBD Is the patient able to respond to transportation needs?: Yes In the past 12 months, has lack of transportation kept you from medical appointments or from getting medications?: No In the past 12 months, has lack of transportation kept you from meetings, work, or from getting things needed for daily living?: No Resource referrals recommended: Neuropsychology  Discharge Planning Living Arrangements: Spouse/significant other Support Systems: Spouse/significant other, Children, Other relatives Type of Residence: Private residence Insurance Resources: [employer], Multimedia programmer (specify) (BCBS supp) Financial Resources: Radio broadcast assistant Screen Referred: No Living Expenses: Own Money Management: Spouse Does the patient have any problems obtaining your medications?: No Home Management: reports her husband pirmarily prepares meals and does house cleaning, however, she will do some house cleaning if needed Patient/Family Preliminary Plans: No changes Care Coordinator Barriers to Discharge: Decreased caregiver support, Lack of/limited family support Care Coordinator Anticipated Follow Up Needs: HH/OP  Clinical Impression SW met with pt and pt niece in room to introduce self, explain role, and discuss discharge process. Pt is not a veteran, but husband has served. Unsure on if he uses [place] benefits. HCPOA-husband Marvin . DME: 3in1 BSC (used over toilet) in upstairs bathroom. Plans to stay on main level. Pt aware SW will f/u with her husband.  Rana Snare 02/06/2022, 11:37 AM

## 2022-02-06 NOTE — Care Management (Signed)
Inpatient [facility] Individual Statement of Services  Patient Name:  [name]  Date:  02/06/2022  Welcome to the [facility].  Our goal is to provide you with an individualized program based on your diagnosis and situation, designed to meet your specific needs.  With this comprehensive rehabilitation program, you will be expected to participate in at least 3 hours of rehabilitation therapies Monday-Friday, with modified therapy programming on the weekends.  Your rehabilitation program will include the following services:  Physical Therapy (PT), Occupational Therapy (OT), Speech Therapy (ST), 24 hour per day rehabilitation nursing, Therapeutic Recreaction (TR), Psychology, Neuropsychology, Care Coordinator, Rehabilitation Medicine, [place], and Other  Weekly team conferences will be held on Tuesdays to discuss your progress.  Your Inpatient Rehabilitation Care Coordinator will talk with you frequently to get your input and to update you on team discussions.  Team conferences with you and your family in attendance may also be held.  Expected length of stay: 10-14 days    Overall anticipated outcome: Supervision  Depending on your progress and recovery, your program may change. Your Inpatient Rehabilitation Care Coordinator will coordinate services and will keep you informed of any changes. Your Inpatient Rehabilitation Care Coordinator's name and contact numbers are listed  below.  The following services may also be recommended but are not provided by the [facility] will be made to provide these services after discharge if needed.  Arrangements include referral to agencies that provide these services.  Your insurance has been verified to be:  Medicare A/B  Your primary  doctor is:  Celene Squibb  Pertinent information will be shared with your doctor and your insurance company.  Inpatient Rehabilitation Care Coordinator:  Cathleen Corti [phone] or [phone]  Information discussed with and copy given to patient by: Rana Snare, 02/06/2022, 11:15 AM

## 2022-02-06 NOTE — Progress Notes (Signed)
Patient ID: [name], female   DOB: [birth year], 81 y.o.   MRN: [id number]  SW made contact with pt husband [name] to introduce self, explain role, discuss d/c process, and ELOS. SW informed will provide updates next week after team conference.   Loralee Pacas, MSW, [place] Office: [phone] Cell: [phone] Fax: [phone]

## 2022-02-06 NOTE — Progress Notes (Signed)
PROGRESS NOTE   Subjective/Complaints:  Pt reports pain 0/10 at rest, but awful with movement.   Having an insecure, scared feeling- won't get better and will "stuck like this". Explained it takes at least 50month, but should get better from hip.   Asking what day it is- thought was 5/24 Wednesday- not off by much.    ROS:  Pt denies SOB, abd pain, CP, N/V/C/D, and vision changes   Objective:   DG CHEST PORT 1 VIEW  Result Date: 02/04/2022 CLINICAL DATA:  [id number] wheezing EXAM: PORTABLE CHEST 1 VIEW COMPARISON:  Chest radiograph dated September 13, 2020 FINDINGS: The cardiomediastinal silhouette is unchanged in contour.RIGHT-sided calcified granuloma. Mild peribronchial cuffing. No pleural effusion. No pneumothorax. Visualized abdomen is unremarkable. Advanced degenerative changes of the RIGHT shoulder. Status post LEFT shoulder arthroplasty. IMPRESSION: Peribronchial cuffing as can be seen in small airways disease or bronchitis. Electronically Signed   By: SValentino SaxonM.D.   On: 02/04/2022 10:50   VAS [place]OWER EXTREMITY VENOUS (DVT)  Result Date: 02/04/2022  Lower Venous DVT Study Patient Name:  CBRYANNE RIQUELME Date of Exam:   02/04/2022 Medical Rec #: [id number]        Accession #:    [id number] of Birth: 4January 05, 1942        Patient Gender: F Patient Age:   89years [facility]      V[facility] [place]OWER EXTREMITY VENOUS (DVT) Referring Phys: DLauraine Rinne--------------------------------------------------------------------------------  Indications: Swelling, s/p fall and surgery.  Risk Factors: Surgery 02-02-2022 Cannulated right hip pinning. Limitations: Patient pain. Comparison Study: No prior studies. Performing Technologist: RDarlin CocoRDMS, RVT  Examination Guidelines: A complete evaluation includes B-mode imaging, spectral Doppler, color Doppler, and power Doppler as needed of all  accessible portions of each vessel. Bilateral testing is considered an integral part of a complete examination. Limited examinations for reoccurring indications may be performed as noted. The reflux portion of the exam is performed with the patient in reverse Trendelenburg.  +---------+---------------+---------+-----------+----------+--------------+ RIGHT    CompressibilityPhasicitySpontaneityPropertiesThrombus Aging +---------+---------------+---------+-----------+----------+--------------+ CFV      Full           Yes      Yes                                 +---------+---------------+---------+-----------+----------+--------------+ SFJ      Full                                                        +---------+---------------+---------+-----------+----------+--------------+ FV Prox  Full                                                        +---------+---------------+---------+-----------+----------+--------------+ FV Mid   Full                                                        +---------+---------------+---------+-----------+----------+--------------+  FV DistalFull                                                        +---------+---------------+---------+-----------+----------+--------------+ PFV      Full                                                        +---------+---------------+---------+-----------+----------+--------------+ POP      Full           Yes      Yes                                 +---------+---------------+---------+-----------+----------+--------------+ PTV      Full                                                        +---------+---------------+---------+-----------+----------+--------------+ PERO     Full                                                        +---------+---------------+---------+-----------+----------+--------------+ Gastroc  Full                                                         +---------+---------------+---------+-----------+----------+--------------+   +---------+---------------+---------+-----------+----------+--------------+ LEFT     CompressibilityPhasicitySpontaneityPropertiesThrombus Aging +---------+---------------+---------+-----------+----------+--------------+ CFV      Full           Yes      Yes                                 +---------+---------------+---------+-----------+----------+--------------+ SFJ      Full                                                        +---------+---------------+---------+-----------+----------+--------------+ FV Prox  Full                                                        +---------+---------------+---------+-----------+----------+--------------+ FV Mid   Full                                                        +---------+---------------+---------+-----------+----------+--------------+  FV DistalFull                                                        +---------+---------------+---------+-----------+----------+--------------+ PFV      Full                                                        +---------+---------------+---------+-----------+----------+--------------+ POP      Full           Yes      Yes                                 +---------+---------------+---------+-----------+----------+--------------+ PTV      Full                                                        +---------+---------------+---------+-----------+----------+--------------+ PERO     Full                                                        +---------+---------------+---------+-----------+----------+--------------+ Gastroc  Full                                                        +---------+---------------+---------+-----------+----------+--------------+     Summary: RIGHT: - There is no evidence of deep vein thrombosis in the lower extremity.  - No cystic structure found in  the popliteal fossa.  LEFT: - There is no evidence of deep vein thrombosis in the lower extremity.  - No cystic structure found in the popliteal fossa.  *See table(s) above for measurements and observations. Electronically signed by Servando Snare MD on 02/04/2022 at 5:29:14 PM.    Final     Recent Labs    02/04/22 0926 02/05/22 0519  WBC 12.3* 9.8  HGB 11.1* 10.8*  HCT 35.4* 33.6*  PLT 264 267   Recent Labs    02/05/22 0519 02/06/22 0623  NA 137 138  K 3.2* 4.0  CL 105 103  CO2 26 29  GLUCOSE 101* 106*  BUN 7* 7*  CREATININE 0.79 0.84  CALCIUM 8.7* 9.0    Intake/Output Summary (Last 24 hours) at 02/06/2022 0830 Last data filed at 02/06/2022 0700 Gross per 24 hour  Intake 680 ml  Output 400 ml  Net 280 ml        Physical Exam: Vital Signs Blood pressure (!) 153/75, pulse 82, temperature 97.9 F (36.6 C), resp. rate 18, height '5\' 4"'$  (1.626 m), weight 75.8 kg, SpO2 97 %.    General: awake, alert, appropriate, NAD HENT: conjugate gaze; oropharynx moist CV: regular rate; no JVD Pulmonary:  CTA B/L; no W/R/R- good air movement GI: soft, NT, ND, (+)BS Psychiatric: appropriate Neurological: delayed responses and asking for a second who I was, but then recognized me- thought was Wed 5/24- is Thursday 5/25 Musculoskeletal:     Cervical back: Neck supple. No tenderness.     Comments: LUE 5/5 RUE- deltoid 2-/5- can only lift arm to 20-30 degrees abduction of flexion Otherwise 5-/5 in RUE- cannot participate in empty can test- obvious RTC tear LLE- 5/5 RLE- HF 2-/5; otherwise 5/5 in RLE  Skin: R hip has dressing in place- mod edema, no erythema  Neurological:     General: No focal deficit present.     Mental Status: She is alert and oriented to person, place, and time.     Comments: Patient is alert.  No acute distress.  Oriented x3 and follows commands.  Assessment/Plan: 1. Functional deficits which require 3+ hours per day of interdisciplinary therapy in a  comprehensive inpatient rehab setting. Physiatrist is providing close team supervision and 24 hour management of active medical problems listed below. Physiatrist and rehab team continue to assess barriers to discharge/monitor patient progress toward functional and medical goals  Care Tool:  Bathing              Bathing assist Assist Level: Maximal Assistance - Patient 24 - 49%     Upper Body Dressing/Undressing Upper body dressing   What is the patient wearing?: Hospital gown only    Upper body assist Assist Level: Maximal Assistance - Patient 25 - 49%    Lower Body Dressing/Undressing Lower body dressing            Lower body assist Assist for lower body dressing: Maximal Assistance - Patient 25 - 49%     Toileting Toileting    Toileting assist Assist for toileting: Moderate Assistance - Patient 50 - 74%     Transfers Chair/bed transfer  Transfers assist  Chair/bed transfer activity did not occur: Safety/medical concerns  Chair/bed transfer assist level: Moderate Assistance - Patient 50 - 74%     Locomotion Ambulation   Ambulation assist      Assist level: Minimal Assistance - Patient > 75% Assistive device: Parallel bars Max distance: 38f   Walk 10 feet activity   Assist  Walk 10 feet activity did not occur: Safety/medical concerns        Walk 50 feet activity   Assist Walk 50 feet with 2 turns activity did not occur: Safety/medical concerns         Walk 150 feet activity   Assist Walk 150 feet activity did not occur: Safety/medical concerns         Walk 10 feet on uneven surface  activity   Assist Walk 10 feet on uneven surfaces activity did not occur: Safety/medical concerns         Wheelchair     Assist Is the patient using a wheelchair?: Yes Type of Wheelchair: Manual    Wheelchair assist level: Supervision/Verbal cueing Max wheelchair distance: 39f   Wheelchair 50 feet with 2 turns  activity    Assist    Wheelchair 50 feet with 2 turns activity did not occur: Safety/medical concerns       Wheelchair 150 feet activity     Assist  Wheelchair 150 feet activity did not occur: Safety/medical concerns       Blood pressure (!) 153/75, pulse 82, temperature 97.9 F (36.6 C), resp. rate 18, height '5\' 4"'$  (1.626 m), weight 75.8  kg, SpO2 97 %.  Medical Problem List and Plan: 1. Functional deficits secondary to impacted subcapital right hip fracture after fall.  Status post ORIF 02/02/2022.  Weightbearing as tolerated.             -patient may  shower- with Aquacel dressing in place             -ELOS/Goals: `1-14 days Supervision to mod I  Con't CIR- PT and OT 2.  Antithrombotics: -DVT/anticoagulation:  Pharmaceutical: Lovenox x6 weeks versus transition to Xarelto at discharge.  Check vascular study             -antiplatelet therapy: N/A 3. Pain Management: Hydrocodone as needed- encouraged pt to take for therapy.   5/25- reminded pt to ask for pain meds before therapy- seems to be a little delayed in memory 4. Mood: Wellbutrin 300 mg daily, Adderall 30 mg twice daily, melatonin 3 mg nightly  5/25- pt asking to increase meds- will add Lexapro 5 mg daily instead of increasing wellbutrin since on Wellbutrin, Adderall and Rocephin, can lower seizure threshold. No SSRI's in allergy list.               -antipsychotic agents: N/A 5. Neuropsych: This patient is capable of making decisions on her own behalf. 6. Skin/Wound Care: Routine skin checks 7. Fluids/Electrolytes/Nutrition: Routine in and out with follow-up chemistries 8.  Leukocytosis.  Empiric Rocephin.    5/25- U/A was large leuks and (+) nitrites- even though Urine Cx (-)- since had large leuks and nitrites, will con't Rocephin for 5 days total.  9.  Hypertension.  Norvasc 10 mg daily, Cozaar 100 mg daily.  Monitor with increased mobility  5/24- BP controlled- con't regimen 10.  Hypothyroidism.  Synthroid 11.   Obesity.  BMI 28.23.  Dietary follow-up 12. R Rotator cuff tear- non surgical - pt didn't want surgery right now- pain control for now 13. Constipation- will give Sorbitol now for BM tonight hopefully.    5/24- give MOM- see if can get results- if none by tomorrow, will check KUB    I spent a total of 39   minutes on total care today- >50% coordination of care- due to d/w pt about depression at length as well as calling pharmacy to discuss Urine/UTI as well as her depression meds.     LOS: 2 days A FACE TO FACE EVALUATION WAS PERFORMED  Colleena Kurtenbach 02/06/2022, 8:30 AM

## 2022-02-06 NOTE — Progress Notes (Signed)
Occupational Therapy Session Note  Patient Details  Name: [name] MRN: [id number] Date of Birth: March 17, 1941  Today's Date: 02/06/2022 OT Group Time: 1100-1211 OT Group Time Calculation (min): 71 min   Short Term Goals: Week 1:  OT Short Term Goal 1 (Week 1): Pt will complete sit<>stand transfer with CGA OT Short Term Goal 2 (Week 1): Pt will complete stand pivot w/c<>BSC with CGA OT Short Term Goal 3 (Week 1): Pt will donn/doff pants with mod assist using AE as needed  Skilled Therapeutic Interventions/Progress Updates:  Pt participated in group session with a focus on stress mgmt, education on healthy coping strategies, and social interaction. Focus of session on providing coping strategies to manage new diagnosis to allow for improved mental health to increase overall quality of life . Discussed how to break down stressors into "daily hassles," "major life stressors" and "life circumstances" in an effort to allow pts to chunk their stressors into groups and determine where to best put their efforts/time when dealing with stress. Pt actively sharing stressors and contributing to group conversation. Provided active listening, emotional support and therapeutic use of self. Offered education on factors that protect [place] against stress such as "daily uplifts," "healthy coping strategies" and "protective factors." Encouraged all group members to make an effort to actively recall one event from their day that was a daily uplift in an effort to protect their mindset from stressors as well as sharing this information with their caregivers to facilitate improved caregiver communication and decrease overall burden of care. Pt reports feeling like a "weight was lifted" after attending group session.   Issued pt handouts on healthy coping strategies to implement into routine. Pt transported back to room by RT.  Therapy Documentation Precautions:  Precautions Precautions: Fall Precaution Comments:  Hard of hearing Restrictions Weight Bearing Restrictions: Yes RLE Weight Bearing: Weight bearing as tolerated   Pain: no pain reported during session     Therapy/Group: Group Therapy  [name] 02/06/2022, 12:20 PM

## 2022-02-06 NOTE — Progress Notes (Signed)
Occupational Therapy Session Note  Patient Details  Name: [name] MRN: [id number] Date of Birth: 1941-07-13  Today's Date: 02/06/2022 OT Individual Time: 8453-6468 OT Individual Time Calculation (min): 75 min    Short Term Goals: Week 1:  OT Short Term Goal 1 (Week 1): Pt will complete sit<>stand transfer with CGA OT Short Term Goal 2 (Week 1): Pt will complete stand pivot w/c<>BSC with CGA OT Short Term Goal 3 (Week 1): Pt will donn/doff pants with mod assist using AE as needed  Skilled Therapeutic Interventions/Progress Updates:    Pt resting in bed upon arrival with RN present. OT intervention with focus on bed mobility, sit<>stand, standing balance, functional transfers, BADLs, activity tolerance, and safety awareness to increase independence with BADLs. Supine>sit EOB with HOB elevated with min A. Sitting balance with supervision. Sit>stand from EOB with min A. Stand pivot transfer with RW with CGA. All sit<>stand from w/c with CGA. UB bathing/dressing with supervision w/c level at sink. LB bathing/dressing with mod A sit<>stand at sink. All standing balance with CGA. Pt agreeable to taking shower on 5/26. Pt remained in w/c with all needs within reach and belt alarm activated. Pt pleased with progress this morning.   Therapy Documentation Precautions:  Precautions Precautions: Fall Precaution Comments: Hard of hearing Restrictions Weight Bearing Restrictions: Yes RLE Weight Bearing: Weight bearing as tolerated  Pain:  Pt denies pain at rest this morning and "slight" increase with activity   Therapy/Group: Individual Therapy  Leroy Libman 02/06/2022, 9:52 AM

## 2022-02-06 NOTE — Plan of Care (Signed)
  Problem: Consults Goal: RH GENERAL PATIENT EDUCATION Description: See Patient Education module for education specifics. Outcome: Progressing Goal: Skin Care Protocol Initiated - if Braden Score 18 or less Description: If consults are not indicated, leave blank or document N/A Outcome: Progressing   Problem: RH BOWEL ELIMINATION Goal: RH STG MANAGE BOWEL WITH ASSISTANCE Description: STG Manage Bowel with Supervision Assistance. Outcome: Progressing Goal: RH STG MANAGE BOWEL W/MEDICATION W/ASSISTANCE Description: STG Manage Bowel with Medication with Supervision Assistance. Outcome: Progressing   Problem: RH BLADDER ELIMINATION Goal: RH STG MANAGE BLADDER WITH ASSISTANCE Description: STG Manage Bladder With Supervision Assistance Outcome: Progressing Goal: RH STG MANAGE BLADDER WITH MEDICATION WITH ASSISTANCE Description: STG Manage Bladder With Medication With Supervision Assistance. Outcome: Progressing   Problem: RH SKIN INTEGRITY Goal: RH STG MAINTAIN SKIN INTEGRITY WITH ASSISTANCE Description: STG Maintain Skin Integrity With Supervision Assistance. Outcome: Progressing Goal: RH STG ABLE TO PERFORM INCISION/WOUND CARE W/ASSISTANCE Description: STG Able To Perform Incision/Wound Care With Supervision Assistance. Outcome: Progressing   Problem: RH SAFETY Goal: RH STG ADHERE TO SAFETY PRECAUTIONS W/ASSISTANCE/DEVICE Description: STG Adhere to Safety Precautions With Cues and Reminders. Outcome: Progressing Goal: RH STG DECREASED RISK OF FALL WITH ASSISTANCE Description: STG Decreased Risk of Fall With Supervision Assistance. Outcome: Progressing   Problem: RH PAIN MANAGEMENT Goal: RH STG PAIN MANAGED AT OR BELOW PT'S PAIN GOAL Description: < 3 on a 0-10 pain scale. Outcome: Progressing   Problem: RH KNOWLEDGE DEFICIT GENERAL Goal: RH STG INCREASE KNOWLEDGE OF SELF CARE AFTER HOSPITALIZATION Description: Patient will demonstrate knowledge of self-care management,  medication/pain management, skin/wound care, weight bearing precautions with educational materials and handouts provided by staff independently at discharge. Outcome: Progressing

## 2022-02-07 ENCOUNTER — Inpatient Hospital Stay (HOSPITAL_COMMUNITY): Payer: Medicare Other

## 2022-02-07 DIAGNOSIS — S72011S Unspecified intracapsular fracture of right femur, sequela: Secondary | ICD-10-CM

## 2022-02-07 DIAGNOSIS — S72011A Unspecified intracapsular fracture of right femur, initial encounter for closed fracture: Secondary | ICD-10-CM | POA: Diagnosis not present

## 2022-02-07 MED ORDER — BISACODYL 10 MG RE SUPP
10.0000 mg | Freq: Every day | RECTAL | Status: DC | PRN
  Administered 2022-02-07: 10 mg via RECTAL
  Filled 2022-02-07: qty 1

## 2022-02-07 MED ORDER — SORBITOL 70 % SOLN
60.0000 mL | Freq: Once | Status: AC
Start: 2022-02-07 — End: 2022-02-07
  Administered 2022-02-07: 60 mL via ORAL
  Filled 2022-02-07: qty 60

## 2022-02-07 NOTE — Progress Notes (Signed)
PROGRESS NOTE   Subjective/Complaints:  Pt reports still no BM- doesn't feel "real" constipated, but knows needs to go.  Was amazed that transition to EOB this Am didn't hurt at all.  Admits to a little nausea.  Admits to being less depressed as progressing with therapy.     ROS:  Pt denies SOB, abd pain, CP, N/V/(+) C/D, and vision changes  Objective:   DG Abd 1 View  Result Date: 02/07/2022 CLINICAL DATA:  Constipation EXAM: ABDOMEN - 1 VIEW COMPARISON:  None Available. FINDINGS: Nonobstructive bowel-gas pattern. No significantly abnormal amount of retained fecal material appreciated. There are a few faint calcific densities in the region of the kidneys likely representing nephrolithiasis, measuring up to approximately 4 mm. IMPRESSION: 1. Unremarkable bowel gas pattern. 2. Bilateral nephrolithiasis. Electronically Signed   By: Ofilia Neas M.D.   On: 02/07/2022 08:45    Recent Labs    02/04/22 0926 02/05/22 0519  WBC 12.3* 9.8  HGB 11.1* 10.8*  HCT 35.4* 33.6*  PLT 264 267   Recent Labs    02/05/22 0519 02/06/22 0623  NA 137 138  K 3.2* 4.0  CL 105 103  CO2 26 29  GLUCOSE 101* 106*  BUN 7* 7*  CREATININE 0.79 0.84  CALCIUM 8.7* 9.0    Intake/Output Summary (Last 24 hours) at 02/07/2022 0852 Last data filed at 02/07/2022 0834 Gross per 24 hour  Intake 480 ml  Output 400 ml  Net 80 ml        Physical Exam: Vital Signs Blood pressure (!) 147/78, pulse 82, temperature 97.9 F (36.6 C), resp. rate 17, height '5\' 4"'$  (1.626 m), weight 75.8 kg, SpO2 99 %.     General: awake, alert, appropriate, sitting EOB; c/o 0/10 pain; OT in room; NAD HENT: conjugate gaze; oropharynx moist CV: regular rate; no JVD Pulmonary: CTA B/L; no W/R/R- good air movement GI: somewhat firm- protuberant and distended; hypoactive, but still has bowel sounds- no tinkling.  Psychiatric: appropriate- making some jokes today-  appears less depressed Neurological: Ox3- slightly delayed responses, but better today- recognized me today Musculoskeletal:     Cervical back: Neck supple. No tenderness.     Comments: LUE 5/5 RUE- deltoid 2-/5- can only lift arm to 20-30 degrees abduction of flexion Otherwise 5-/5 in RUE- cannot participate in empty can test- obvious RTC tear LLE- 5/5 RLE- HF 2-/5; otherwise 5/5 in RLE  Skin: R hip has dressing in place- mod edema, no erythema  Neurological:     General: No focal deficit present.     Mental Status: She is alert and oriented to person, place, and time.     Comments: Patient is alert.  No acute distress.  Oriented x3 and follows commands.  Assessment/Plan: 1. Functional deficits which require 3+ hours per day of interdisciplinary therapy in a comprehensive inpatient rehab setting. Physiatrist is providing close team supervision and 24 hour management of active medical problems listed below. Physiatrist and rehab team continue to assess barriers to discharge/monitor patient progress toward functional and medical goals  Care Tool:  Bathing    Body parts bathed by patient: Right arm, Left arm, Chest, Abdomen, Front perineal area,  Buttocks, Right upper leg, Left upper leg, Face   Body parts bathed by helper: Right lower leg, Left lower leg     Bathing assist Assist Level: Moderate Assistance - Patient 50 - 74%     Upper Body Dressing/Undressing Upper body dressing   What is the patient wearing?: Pull over shirt    Upper body assist Assist Level: Set up assist    Lower Body Dressing/Undressing Lower body dressing      What is the patient wearing?: Pants     Lower body assist Assist for lower body dressing: Minimal Assistance - Patient > 75%     Toileting Toileting    Toileting assist Assist for toileting: Moderate Assistance - Patient 50 - 74%     Transfers Chair/bed transfer  Transfers assist  Chair/bed transfer activity did not occur:  Safety/medical concerns  Chair/bed transfer assist level: Minimal Assistance - Patient > 75%     Locomotion Ambulation   Ambulation assist      Assist level: Minimal Assistance - Patient > 75% Assistive device: Parallel bars Max distance: 38f   Walk 10 feet activity   Assist  Walk 10 feet activity did not occur: Safety/medical concerns        Walk 50 feet activity   Assist Walk 50 feet with 2 turns activity did not occur: Safety/medical concerns         Walk 150 feet activity   Assist Walk 150 feet activity did not occur: Safety/medical concerns         Walk 10 feet on uneven surface  activity   Assist Walk 10 feet on uneven surfaces activity did not occur: Safety/medical concerns         Wheelchair     Assist Is the patient using a wheelchair?: Yes Type of Wheelchair: Manual    Wheelchair assist level: Supervision/Verbal cueing Max wheelchair distance: 383f   Wheelchair 50 feet with 2 turns activity    Assist    Wheelchair 50 feet with 2 turns activity did not occur: Safety/medical concerns       Wheelchair 150 feet activity     Assist  Wheelchair 150 feet activity did not occur: Safety/medical concerns       Blood pressure (!) 147/78, pulse 82, temperature 97.9 F (36.6 C), resp. rate 17, height '5\' 4"'$  (1.626 m), weight 75.8 kg, SpO2 99 %.  Medical Problem List and Plan: 1. Functional deficits secondary to impacted subcapital right hip fracture after fall.  Status post ORIF 02/02/2022.  Weightbearing as tolerated.             -patient may  shower- with Aquacel dressing in place             -ELOS/Goals: `1-14 days Supervision to mod I  Con't CIR- PT and OT- IPOC today- making gains 2.  Antithrombotics: -DVT/anticoagulation:  Pharmaceutical: Lovenox x6 weeks versus transition to Xarelto at discharge.  Check vascular study             -antiplatelet therapy: N/A 3. Pain Management: Hydrocodone as needed- encouraged pt to  take for therapy.   5/25- reminded pt to ask for pain meds before therapy- seems to be a little delayed in memory 4. Mood: Wellbutrin 300 mg daily, Adderall 30 mg twice daily, melatonin 3 mg nightly  5/25- pt asking to increase meds- will add Lexapro 5 mg daily instead of increasing wellbutrin since on Wellbutrin, Adderall and Rocephin, can lower seizure threshold. No SSRI's in allergy list.  5/26- went over changes with pt- also pt seeing neuropsych today- went over pt issues with them.              -antipsychotic agents: N/A 5. Neuropsych: This patient is capable of making decisions on her own behalf. 6. Skin/Wound Care: Routine skin checks 7. Fluids/Electrolytes/Nutrition: Routine in and out with follow-up chemistries 8.  Leukocytosis.  Empiric Rocephin.    5/25- U/A was large leuks and (+) nitrites- even though Urine Cx (-)- since had large leuks and nitrites, will con't Rocephin for 5 days total.   5/26- finishes IV ABX tomorrow 9.  Hypertension.  Norvasc 10 mg daily, Cozaar 100 mg daily.  Monitor with increased mobility  5/24- BP controlled- con't regimen 10.  Hypothyroidism.  Synthroid 11.  Obesity.  BMI 28.23.  Dietary follow-up 12. R Rotator cuff tear- non surgical - pt didn't want surgery right now- pain control for now 13. Constipation- will give Sorbitol now for BM tonight hopefully.    5/25- give MOM- see if can get results- if none by tomorrow, will check KUB  5/26- will order Sorbitol 60cc after therapy- but also ordered KUB to make sure no obstruction/ileus, since had surgery and no BM since surgery.     I spent a total of   35 minutes on total care today- >50% coordination of care- due to IPOC and ordering KUB- and dealing with significant constipation. Also d/w pt's OT as well as going over with neuropsych.    LOS: 3 days A FACE TO FACE EVALUATION WAS PERFORMED  Ying Rocks 02/07/2022, 8:52 AM

## 2022-02-07 NOTE — IPOC Note (Signed)
Overall Plan of Care [facility]) Patient Details Name: [name] MRN: [id number] DOB: Jan 27, 1941  Admitting Diagnosis: Closed subcapital fracture of neck of right femur [facility])  Hospital Problems: Principal Problem:   Closed subcapital fracture of neck of right femur ([place])     Functional Problem List: Nursing Bladder, Bowel, Edema, Endurance, Medication Management, Motor, Pain, Safety, Skin Integrity  PT Balance, Endurance, Motor, Pain, Safety  OT Endurance, Motor, Pain, Skin Integrity  SLP    TR         Basic ADL's: OT Bathing, Toileting, Dressing     Advanced  ADL's: OT       Transfers: PT Bed Mobility, Bed to Chair, Car  OT Toilet, Tub/Shower     Locomotion: PT Ambulation, Stairs, Wheelchair Mobility     Additional Impairments: OT    SLP        TR      Anticipated Outcomes Item Anticipated Outcome  Self Feeding    Swallowing      Basic self-care  min assist LB; supervision UB and transfers  Toileting  supervision   Bathroom Transfers supervision  Bowel/Bladder  supervision  Transfers  Supervision  Locomotion  Supervision  Communication     Cognition     Pain  < 3  Safety/Judgment  supervision   Therapy Plan: PT Intensity: Minimum of 1-2 x/day ,45 to 90 minutes PT Frequency: 5 out of 7 days PT Duration Estimated Length of Stay: 10-14 days OT Intensity: Minimum of 1-2 x/day, 45 to 90 minutes OT Frequency: 5 out of 7 days OT Duration/Estimated Length of Stay: 10-14 days     Team Interventions: Nursing Interventions Patient/Family Education, Bladder Management, Bowel Management, Disease Management/Prevention, Pain Management, Medication Management, Skin Care/Wound Management, Discharge Planning  PT interventions Ambulation/gait training, Discharge planning, Functional mobility training, Psychosocial support, Therapeutic Activities, Visual/perceptual remediation/compensation, Wheelchair propulsion/positioning, Therapeutic Exercise, Skin  care/wound management, Neuromuscular re-education, Disease management/prevention, Medical illustrator training, Cognitive remediation/compensation, DME/adaptive equipment instruction, Pain management, Splinting/orthotics, UE/LE Strength taining/ROM, UE/LE Coordination activities, Stair training, Patient/family education, Functional electrical stimulation, Community reintegration  OT Interventions Training and development officer, Discharge planning, Pain management, Self Care/advanced ADL retraining, Therapeutic Activities, UE/LE Coordination activities, Functional mobility training, Disease mangement/prevention, Patient/family education, Skin care/wound managment, Therapeutic Exercise, Community reintegration, Engineer, drilling, Neuromuscular re-education, Psychosocial support, UE/LE Strength taining/ROM  SLP Interventions    TR Interventions    SW/CM Interventions Discharge Planning, Psychosocial Support, Patient/Family Education   Barriers to Discharge MD  Medical stability, Home enviroment access/loayout, Wound care, Weight, and Weight bearing restrictions  Nursing Decreased caregiver support, Home environment access/layout, Incontinence, Wound Care, Lack of/limited family support, Weight bearing restrictions 1 level, 1 step, no rails. Min assist from spouse. PRN assist from son and DIL.  PT Home environment access/layout, Insurance for SNF coverage, Weight    OT Other (comments) pain and nausea limiting pts tolerance to therapies  SLP      SW Decreased caregiver support, Lack of/limited family support     Team Discharge Planning: Destination: PT-Home ,OT- Home , SLP-  Projected Follow-up: PT-24 hour supervision/assistance, OT-  24 hour supervision/assistance, Home health OT, SLP-  Projected Equipment Needs: PT-To be determined, OT- To be determined, SLP-  Equipment Details: PT- , OT-  Patient/family involved in discharge planning: PT- Patient,  OT-Patient, SLP-   MD ELOS:  10-14 days Medical Rehab Prognosis:  Good Assessment:  The patient has been admitted for CIR therapies with the diagnosis of R hip[ fracture and R RTC tear. The team will  be addressing functional mobility, strength, stamina, balance, safety, adaptive techniques and equipment, self-care, bowel and bladder mgt, patient and caregiver education, dealing with severe constipation. Goals have been set at Supervision to min A. Anticipated discharge destination is home.        See Team Conference Notes for weekly updates to the plan of care

## 2022-02-07 NOTE — Progress Notes (Addendum)
Daily Progress Note   Patient Name: [name]       Date: 02/07/2022 DOB: Aug 16, 1941  Age: 81 y.o. MRN#: [id number] Attending Physician: Courtney Heys, MD Primary Care Physician: Celene Squibb, MD Admit Date: 02/04/2022  Reason for Consultation/Follow-up: Establishing goals of care  Subjective: Medical records reviewed. Patient assessed at the bedside.  Reports feeling tired today.  [name], grandson, and grandson's girlfriend are at the bedside visiting.  Received call from [name] today that he was unable to locate paperwork on advanced directives.  Reviewed MOST form and HCPOA/living will, offering to arrange for document to be notarized during hospitalization.  Patient wishes to discuss further tonight with her family and then continue the conversation.  PMT will continue to support holistically.   Length of Stay: 3  Physical Exam Vitals and nursing note reviewed.  Constitutional:      General: She is not in acute distress. HENT:     Head: Normocephalic and atraumatic.  Cardiovascular:     Rate and Rhythm: Normal rate.  Pulmonary:     Effort: Pulmonary effort is normal. No respiratory distress.  Skin:    General: Skin is warm and dry.  Neurological:     Mental Status: She is alert.  Psychiatric:        Mood and Affect: Mood normal.        Behavior: Behavior is cooperative.            Vital Signs: BP (!) 164/89 (BP Location: Right Arm)   Pulse 85   Temp 97.8 F (36.6 C)   Resp 18   Ht '5\' 4"'$  (1.626 m)   Wt 75.8 kg   SpO2 94%   BMI 28.67 kg/m  SpO2: SpO2: 94 % O2 Device: O2 Device: Room Air O2 Flow Rate:        Palliative Assessment/Data:     Palliative Care Assessment & Plan   Patient Profile: 81 y.o. female  with past medical history of  hypertension, hypothyroidism, osteopenia, anxiety/depression, GERD admitted on 02/01/2022 with fall onto right side.    Patient is s/p right subcapital femoral fracture. PMT has been consulted to assist with goals of care conversation.    Assessment: right subcapital femoral fracture  Recommendations/Plan: DNR Ongoing discussions regarding goals of care, patient would like to review  MOST form and advanced directives further with her husband PMT will continue to follow   Prognosis:  Unable to determine  Discharge Planning: To Be Determined    Total time: I spent 35 minutes in the care of the patient today in the above activities and documenting the encounter.   Almer Littleton Johnnette Litter, PA-C  Palliative Medicine Team Team phone # [phone]  Thank you for allowing the Palliative Medicine Team to assist in the care of this patient. Please utilize secure chat with additional questions, if there is no response within 30 minutes please call the above phone number.  Palliative Medicine Team providers are available by phone from 7am to 7pm daily and can be reached through the team cell phone.  Should this patient require assistance outside of these hours, please call the patient's attending physician.

## 2022-02-07 NOTE — Progress Notes (Signed)
Physical Therapy Session Note  Patient Details  Name: [name] MRN: [id number] Date of Birth: 07/12/1941  Today's Date: 02/07/2022 PT Individual Time: 1000-1100 PT Individual Time Calculation (min): 60 min   Short Term Goals: Week 1:  PT Short Term Goal 1 (Week 1): Pt will complete bed mobility with minA PT Short Term Goal 2 (Week 1): Pt will complete bed<>chair transfers with minA and LRAD PT Short Term Goal 3 (Week 1): Pt will ambulate 14f with minA and LRAD PT Short Term Goal 4 (Week 1): Pt will initiate stair training  Skilled Therapeutic Interventions/Progress Updates:      Pt seen supine in bed, agreeable to PT tx. Denies pain but requests assistance to the bathroom. Supine<>sitting EOB, requiring minA for RLE and trunk support. Sit<>Stand to RW with minA for powering to rise - ambulating with CGA and RW to toilet and able to manage clothes without assistance. Continent of bladder (charted) but she did require assistance pulling pants up over hips in standing. Ambulated to the sink with CGA and RW and she was able to complete hand hygiene without UE support while standing with CGA for balance. Transported to main rehab gym for time. Instructed in gait training - ambulated 740f+ 7536fith CGA and RW - antalgic gait with step-to gait pattern, slightly forward flexed trunk - cues throughout for increasing stride length and gait speed to improve efficiency. Pt reporting 3/10 R hip pain so RN notified who provided pain medication during treatment session. Pt also having some nausea so an emesis bag was provided. RN notified at end of session of pt's nausea, believe it to be related to constipation. Initiate stair training using 6inch steps and 2 hand rails - only completing RLE toe taps 1x10 times - pt unable to bear enough weight through RLE to lift LLE onto step. She reports she's unsure if she has hand rails at home, will need to confirm with family. Returned to her room and she  remained seated in w/c with safety belt alarm on, call bell in lap, RN in room for handoff of care.   Therapy Documentation Precautions:  Precautions Precautions: Fall Precaution Comments: Hard of hearing Restrictions Weight Bearing Restrictions: Yes RLE Weight Bearing: Weight bearing as tolerated General:    Therapy/Group: Individual Therapy  ChrAlger Simons26/2023, 7:43 AM

## 2022-02-07 NOTE — Progress Notes (Signed)
Occupational Therapy Session Note  Patient Details  Name: [name] MRN: [id number] Date of Birth: 25-Nov-1940  Today's Date: 02/07/2022 OT Individual Time: 1345-1454 OT Individual Time Calculation (min): 69 min    Short Term Goals: Week 1:  OT Short Term Goal 1 (Week 1): Pt will complete sit<>stand transfer with CGA OT Short Term Goal 2 (Week 1): Pt will complete stand pivot w/c<>BSC with CGA OT Short Term Goal 3 (Week 1): Pt will donn/doff pants with mod assist using AE as needed  Skilled Therapeutic Interventions/Progress Updates:    Pt received supine with no c/o pain, agreeable to OT session. She came to EOB with min A. She reported nausea EOB but had no emesis. Mod A to stand from EOB with increased anxiety. Pt then completed an ambulatory transfer into the bathroom with the RW with CGA overall. Min cueing for sequencing transfer to the TTB in the walk in shower. She required mod A to remove LB clothing, as well as UB d/t LUE shoulder limitations. She completed UB  bathing seated with (S). She stood with min A with heavy reliance on the grab bars. She was able to wash peri areas with min A for standing balance. She was slightly impulsive with standing, requiring cueing for waiting for OT assist. She completed sit >stand with min A and transferred back to the w/c with min A using the RW. She donned a clean gown with min A. Seated level grooming tasks- drying hair with mod A. Bandage replaced on her R hip. Pt was left supine with all needs met, bed alarm set, and call bell within reach.     Therapy Documentation Precautions:  Precautions Precautions: Fall Precaution Comments: Hard of hearing Restrictions Weight Bearing Restrictions: Yes RLE Weight Bearing: Weight bearing as tolerated    Therapy/Group: Individual Therapy  Curtis Sites 02/07/2022, 2:10 PM

## 2022-02-07 NOTE — Progress Notes (Signed)
Occupational Therapy Session Note  Patient Details  Name: [name] MRN: [id number] Date of Birth: [birth year]  Today's Date: 02/07/2022 OT Individual Time: 07:25 -08:40       Short Term Goals: Week 1:  OT Short Term Goal 1 (Week 1): Pt will complete sit<>stand transfer with CGA OT Short Term Goal 2 (Week 1): Pt will complete stand pivot w/c<>BSC with CGA OT Short Term Goal 3 (Week 1): Pt will donn/doff pants with mod assist using AE as needed  Skilled Therapeutic Interventions/Progress Updates: Patient received eating breakfast set up in bed. Patient agreeable to OT treatment and BADL tasks. No report of pain. Patient assisted to EOB with mod assist from supine. Patient needed increased time and vc's for hand placement. Patient stood at sink for grooming with SBA/ set-up. Patient declined to get in shower, but took a sponge bath at the sink. Able to perform dressing and utilize reacher as needed. Required total assist to don TED hose. Patient assisted back to bed per patient request. Worked on improved independence with bed mobility. Patient states she gets in and out on the right side at home. Patient needing mod assist to get LE into the bed. Able to get in with bed flat and without use of the rails. Unable to lift/slide RLE to middle of bed with slipper socks on. Patient motivated to regain independence and return home. Patient in agreement that she may need to address the home exits, where she had her fall, as there is no rail. Continue with skilled OT POC.     Therapy Documentation Precautions:  Precautions Precautions: Fall Precaution Comments: Hard of hearing Restrictions Weight Bearing Restrictions: Yes RLE Weight Bearing: Weight bearing as tolerated General:   Vital Signs: Therapy Vitals Temp: 97.9 F (36.6 C) Pulse Rate: 82 Resp: 17 BP: (!) 147/78 Patient Position (if appropriate): Lying Oxygen Therapy SpO2: 99 % O2 Device: Room Air Pain:no c/o pain during  treatment         Other Treatments:  Patient educated on home modifications to improve safety exiting home incase of emergency. Will need follow up.   Therapy/Group: Individual Therapy  Hermina Barters 02/07/2022, 8:27 AM

## 2022-02-07 NOTE — Progress Notes (Signed)
Flushed pt's IV and the pt stated that it hurt.  Hooked up the IV fluids and the pt stated that it still hurt.  Put a consult in for the IV team to come and assess and see if they needed to start a new IV but pt stated that it didn't hurt anymore.  Restarted IV medication.

## 2022-02-08 MED ORDER — TRAZODONE HCL 50 MG PO TABS
25.0000 mg | ORAL_TABLET | Freq: Every evening | ORAL | Status: DC | PRN
  Administered 2022-02-08 – 2022-02-18 (×11): 50 mg via ORAL
  Filled 2022-02-08 (×11): qty 1

## 2022-02-08 MED ORDER — ALUM & MAG HYDROXIDE-SIMETH 200-200-20 MG/5ML PO SUSP
30.0000 mL | Freq: Four times a day (QID) | ORAL | Status: DC | PRN
  Administered 2022-02-08 – 2022-02-17 (×4): 30 mL via ORAL
  Filled 2022-02-08 (×4): qty 30

## 2022-02-08 MED ORDER — LACTULOSE 10 GM/15ML PO SOLN
30.0000 g | Freq: Once | ORAL | Status: AC
  Administered 2022-02-08: 30 g via ORAL
  Filled 2022-02-08: qty 45

## 2022-02-08 NOTE — Progress Notes (Signed)
PROGRESS NOTE   Subjective/Complaints:  Pt reports still hasn't had BM- since Saturday- had 3 smears last night, but nothing else documented, however, KUB unremarkable yesterday.    Also missed therapy x 1 session due to being too tired yesterday afternoon.   Slept, but doesn't feel rested- asked last night for sleeping medicine- doesn't have anything prn-will order trazodone.      ROS:  Pt denies SOB, abd pain, CP, N/V/(+) C/D, and vision changes   Objective:   DG Abd 1 View  Result Date: 02/07/2022 CLINICAL DATA:  Constipation EXAM: ABDOMEN - 1 VIEW COMPARISON:  None Available. FINDINGS: Nonobstructive bowel-gas pattern. No significantly abnormal amount of retained fecal material appreciated. There are a few faint calcific densities in the region of the kidneys likely representing nephrolithiasis, measuring up to approximately 4 mm. IMPRESSION: 1. Unremarkable bowel gas pattern. 2. Bilateral nephrolithiasis. Electronically Signed   By: Ofilia Neas M.D.   On: 02/07/2022 08:45    No results for input(s): WBC, HGB, HCT, PLT in the last 72 hours.  Recent Labs    02/06/22 0623  NA 138  K 4.0  CL 103  CO2 29  GLUCOSE 106*  BUN 7*  CREATININE 0.84  CALCIUM 9.0    Intake/Output Summary (Last 24 hours) at 02/08/2022 1208 Last data filed at 02/08/2022 1101 Gross per 24 hour  Intake 340 ml  Output 1 ml  Net 339 ml        Physical Exam: Vital Signs Blood pressure (!) 155/71, pulse 79, temperature 97.7 F (36.5 C), temperature source Oral, resp. rate 18, height '5\' 4"'$  (1.626 m), weight 75.8 kg, SpO2 92 %.      General: awake, alert, appropriate, laying in bed; supine; NAD HENT: conjugate gaze; oropharynx moist CV: regular rate; no JVD Pulmonary: CTA B/L; no W/R/R- good air movement GI: more firm; NT, slightly distended; hypoactive BS Psychiatric: appropriate- sleepy Neurological: Ox3- better in delayed  responses- sleepy Musculoskeletal:     Cervical back: Neck supple. No tenderness.     Comments: LUE 5/5 RUE- deltoid 2-/5- can only lift arm to 20-30 degrees abduction of flexion Otherwise 5-/5 in RUE- cannot participate in empty can test- obvious RTC tear LLE- 5/5 RLE- HF 2-/5; otherwise 5/5 in RLE  Skin: R hip has dressing in place- mod edema, no erythema- looks stable  Neurological:     General: No focal deficit present.     Mental Status: She is alert and oriented to person, place, and time.     Comments: Patient is alert.  No acute distress.  Oriented x3 and follows commands.  Assessment/Plan: 1. Functional deficits which require 3+ hours per day of interdisciplinary therapy in a comprehensive inpatient rehab setting. Physiatrist is providing close team supervision and 24 hour management of active medical problems listed below. Physiatrist and rehab team continue to assess barriers to discharge/monitor patient progress toward functional and medical goals  Care Tool:  Bathing    Body parts bathed by patient: Right arm, Left arm, Chest, Abdomen, Front perineal area, Buttocks, Right upper leg, Left upper leg, Face   Body parts bathed by helper: Right lower leg, Left lower leg  Bathing assist Assist Level: Moderate Assistance - Patient 50 - 74%     Upper Body Dressing/Undressing Upper body dressing   What is the patient wearing?: Pull over shirt    Upper body assist Assist Level: Set up assist    Lower Body Dressing/Undressing Lower body dressing      What is the patient wearing?: Pants     Lower body assist Assist for lower body dressing: Minimal Assistance - Patient > 75%     Toileting Toileting    Toileting assist Assist for toileting: Moderate Assistance - Patient 50 - 74%     Transfers Chair/bed transfer  Transfers assist  Chair/bed transfer activity did not occur: Safety/medical concerns  Chair/bed transfer assist level: Minimal Assistance -  Patient > 75%     Locomotion Ambulation   Ambulation assist      Assist level: Minimal Assistance - Patient > 75% Assistive device: Parallel bars Max distance: 62f   Walk 10 feet activity   Assist  Walk 10 feet activity did not occur: Safety/medical concerns        Walk 50 feet activity   Assist Walk 50 feet with 2 turns activity did not occur: Safety/medical concerns         Walk 150 feet activity   Assist Walk 150 feet activity did not occur: Safety/medical concerns         Walk 10 feet on uneven surface  activity   Assist Walk 10 feet on uneven surfaces activity did not occur: Safety/medical concerns         Wheelchair     Assist Is the patient using a wheelchair?: Yes Type of Wheelchair: Manual    Wheelchair assist level: Supervision/Verbal cueing Max wheelchair distance: 325f   Wheelchair 50 feet with 2 turns activity    Assist        Assist Level: Moderate Assistance - Patient 50 - 74% (per PT documentation of 35 feet supervision)   Wheelchair 150 feet activity     Assist      Assist Level: Total Assistance - Patient < 25% (per PT documentation of 35 feet supervision)   Blood pressure (!) 155/71, pulse 79, temperature 97.7 F (36.5 C), temperature source Oral, resp. rate 18, height '5\' 4"'$  (1.626 m), weight 75.8 kg, SpO2 92 %.  Medical Problem List and Plan: 1. Functional deficits secondary to impacted subcapital right hip fracture after fall.  Status post ORIF 02/02/2022.  Weightbearing as tolerated.             -patient may  shower- with Aquacel dressing in place             -ELOS/Goals: `1-14 days Supervision to mod I  Continue CIR- PT, OT  2.  Antithrombotics: -DVT/anticoagulation:  Pharmaceutical: Lovenox x6 weeks versus transition to Xarelto at discharge.  Check vascular study             -antiplatelet therapy: N/A 3. Pain Management: Hydrocodone as needed- encouraged pt to take for therapy.   5/27- pain  controlled- less pain with movement- con't regimen 4. Mood: Wellbutrin 300 mg daily, Adderall 30 mg twice daily, melatonin 3 mg nightly  5/25- pt asking to increase meds- will add Lexapro 5 mg daily instead of increasing wellbutrin since on Wellbutrin, Adderall and Rocephin, can lower seizure threshold. No SSRI's in allergy list.    5/26- went over changes with pt- also pt seeing neuropsych today- went over pt issues with them.              -  antipsychotic agents: N/A 5. Neuropsych: This patient is capable of making decisions on her own behalf. 6. Skin/Wound Care: Routine skin checks 7. Fluids/Electrolytes/Nutrition: Routine in and out with follow-up chemistries 8.  Leukocytosis.  Empiric Rocephin.    5/25- U/A was large leuks and (+) nitrites- even though Urine Cx (-)- since had large leuks and nitrites, will con't Rocephin for 5 days total.   5/27- last dose tonight 9.  Hypertension.  Norvasc 10 mg daily, Cozaar 100 mg daily.  Monitor with increased mobility  5/27- BP a little elevated 119E systolic this AM- con't to monitor trend and see if need to increase meds 10.  Hypothyroidism.  Synthroid 11.  Obesity.  BMI 28.23.  Dietary follow-up 12. R Rotator cuff tear- non surgical - pt didn't want surgery right now- pain control for now 13. Constipation- will give Sorbitol now for BM tonight hopefully.    5/25- give MOM- see if can get results- if none by tomorrow, will check KUB  5/26- will order Sorbitol 60cc after therapy- but also ordered KUB to make sure no obstruction/ileus, since had surgery and no BM since surgery.   5/27- KUB nonobstructive- will give lactulose 30G and follow with soap suds enema.  14. Insomnia  5/27- will order Trazodone 25-50 mg QHS prn for sleep    I spent a total of  37   minutes on total care today- >50% coordination of care- due to reviewing KUB independently; d/w nursing how to treat since no BM and reviewing chart about sleep.    LOS: 4 days A FACE TO FACE  EVALUATION WAS PERFORMED   [place] 02/08/2022, 12:08 PM

## 2022-02-08 NOTE — Consult Note (Signed)
Neuropsychological Consultation   Patient:   [name]   DOB:   [birth year]  MR Number:  [id number]  Location:  [facility] [id number] [place] [address] Dept: [place]: [phone]           Date of Service:   02/07/2022  Start Time:   9 AM End Time:   10 AM  Provider/Observer:  Ilean Skill, Psy.D.       Clinical Neuropsychologist       Billing Code/Service: [phone]  Chief Complaint:    [name] is an 81 year old female with a past medical history including hypertension, hypothyroidism, anxiety/depression, osteopenia.  Patient presented on 02/01/2022 after mechanical fall without loss of consciousness.  The patient was stepping into the garage and missed a step falling on her right side.  Patient had a mildly impacted subcapital right hip fracture.  Patient had orthopedic surgery performed on 02/02/2022.  Patient also was found to have a right rotator cuff tear but any orthopedic interventions are on hold currently.  Patient has been very frustrated with exacerbation of her depression and anxiety describing to providers as feeling "stupid" about her fall for simply not paying close enough attention.  Issues with her depression and anxiety have been addressed by her attending Dr. Dagoberto Ligas and the patient has been started on initial dose of 5 mg Lexapro with plan to titrate up.  Reason for Service:  Patient was referred for neuropsychological consultation due to depression and anxiety with exacerbation of her depression and frustration during her hospitalization.  Below is the HPI for the current admission.  HPI: [name] is an 81 year old right-handed female with history of hypertension, hypothyroidism, anxiety/depression, osteopenia.  Per chart review lives with spouse.  1 level home one-step to entry.  Independent prior to admission with occasional use of a  single-point cane.  Presented 02/01/2022 after mechanical fall without loss of consciousness.  The patient was stepping into the garage and missed a step falling on her right side.  X-rays and imaging revealed mildly impacted subcapital right hip fracture.  Underwent open treatment of right valgus impacted subcapital femoral neck fracture with internal fixation 02/02/2022 per Dr. Georgeanna Harrison.  Weightbearing as tolerated right lower extremity.  Patient also found to have R rotator cuff tear- non operative at this time- Placed on Lovenox for DVT prophylaxis x6 weeks versus transition to Xarelto at discharge.  Plan to keep Aquacel on and dry for 14 days for wound care.  Leukocytosis 15,700 placed on Rocephin empirically with urinalysis completed negative nitrite.  Therapy evaluations completed due to patient decreased functional mobility was admitted for a comprehensive rehab program.  Current Status:  Patient was awake and alert sitting upright in her bed as I entered the room.  She was oriented with good cognition throughout.  She acknowledged a worsening in her depression but reported that it has been getting better over the past day or 2.  We spent some time addressing the issues that it come up with regard to her recovery from her hip fracture and the expectation for her going forward.  The patient is progressing well and participating in therapeutic efforts.  Behavioral Observation: [name]  presents as a 81 y.o.-year-old Right handed Caucasian Female who appeared her stated age. her dress was Appropriate and she was Well Groomed and her manners were Appropriate to the situation.  her participation was indicative of  Appropriate behaviors.  There were physical disabilities noted.  she displayed an appropriate level of cooperation and motivation.     Interactions:    Active Appropriate  Attention:   within normal limits and attention span and concentration were age appropriate  Memory:   within  normal limits; recent and remote memory intact  Visuo-spatial:  not examined  Speech (Volume):  normal  Speech:   normal; normal  Thought Process:  Coherent and Relevant  Though Content:  WNL; not suicidal and not homicidal  Orientation:   person, place, time/date, and situation  Judgment:   Good  Planning:   Fair  Affect:    Appropriate  Mood:    Dysphoric  Insight:   Good  Intelligence:   normal  Medical History:   Past Medical History:  Diagnosis Date   ALLERGIC RHINITIS 11/30/2008   ALLERGIC RHINITIS 11/30/2008   Anxiety    ASTHMA UNSPECIFIED WITH EXACERBATION 02/17/2008   pt. states she does not and has never has asthma   CAROTID BRUIT, LEFT 04/19/2007   Closed fracture of lateral malleolus 06/04/2009   CLOSED FRACTURE OF METATARSAL BONE 06/04/2009   Depression    Esophageal reflux 11/08/2008   GANGLION CYST 04/19/2007   HYPERTENSION 04/07/2007   Hypothyroidism    INSOMNIA 08/07/2010   OSTEOARTHRITIS 04/07/2007   OSTEOPENIA 03/10/2008   PONV (postoperative nausea and vomiting)    UTI 07/20/2009         Patient Active Problem List   Diagnosis Date Noted   Closed subcapital fracture of neck of right femur ([facility]) 02/04/2022   Femoral fracture ([facility]) 02/01/2022   S/P reverse total shoulder arthroplasty, left 09/09/2018   Hypothyroidism, postop 11/19/2016   Cyst in hand 08/23/2014   Preventative health care 07/10/2014   Attention deficit disorder 07/10/2014   Obesity (BMI 30-39.9) 12/26/2013   GERD (gastroesophageal reflux disease) 11/17/2012   Depression with anxiety 10/28/2010   INSOMNIA 08/07/2010   ALLERGIC RHINITIS 11/30/2008   OSTEOPENIA 03/10/2008   Hyperlipidemia 04/19/2007   Left carotid bruit 04/19/2007   Essential hypertension 04/07/2007   Osteoarthritis 04/07/2007     Psychiatric History:  Patient does have a prior psychiatric history including issues with depression and anxiety in the past.  There have been acute exacerbations of her depression  recently with her hip fracture and extended hospital stay.  Family Med/Psych History:  Family History  Problem Relation Age of Onset   Heart disease Mother        Mother also has significant carotid artery stenosis   COPD Father    Alcoholism Father    Cancer Brother    Alzheimer's disease Sister    Thyroid disease Sister        Goiter   Breast cancer Neg Hx    Impression/DX:  [name] is an 81 year old female with a past medical history including hypertension, hypothyroidism, anxiety/depression, osteopenia.  Patient presented on 02/01/2022 after mechanical fall without loss of consciousness.  The patient was stepping into the garage and missed a step falling on her right side.  Patient had a mildly impacted subcapital right hip fracture.  Patient had orthopedic surgery performed on 02/02/2022.  Patient also was found to have a right rotator cuff tear but any orthopedic interventions are on hold currently.  Patient has been very frustrated with exacerbation of her depression and anxiety describing to providers as feeling "stupid" about her fall for simply not paying close enough attention.  Issues with her depression and anxiety have  been addressed by her attending Dr. Dagoberto Ligas and the patient has been started on initial dose of 5 mg Lexapro with plan to titrate up.  Patient was awake and alert sitting upright in her bed as I entered the room.  She was oriented with good cognition throughout.  She acknowledged a worsening in her depression but reported that it has been getting better over the past day or 2.  We spent some time addressing the issues that it come up with regard to her recovery from her hip fracture and the expectation for her going forward.  The patient is progressing well and participating in therapeutic efforts.  Disposition/Plan:  We worked on coping and adjustment issues around her frustrations associated with recent hip fracture with expected extended recovery  time.  Diagnosis:    Hip fracture with extended hospital stay.         Electronically Signed   _______________________ Ilean Skill, Psy.D. Clinical Neuropsychologist

## 2022-02-08 NOTE — Progress Notes (Signed)
Pt requesting medicine for sleep.

## 2022-02-09 NOTE — Progress Notes (Signed)
Physical Therapy Session Note  Patient Details  Name: [name] MRN: [id number] Date of Birth: 1941-06-17  Today's Date: 02/09/2022 PT Individual Time: 3149-7026 PT Individual Time Calculation (min): 30 min   Short Term Goals: Week 1:  PT Short Term Goal 1 (Week 1): Pt will complete bed mobility with minA PT Short Term Goal 2 (Week 1): Pt will complete bed<>chair transfers with minA and LRAD PT Short Term Goal 3 (Week 1): Pt will ambulate 20f with minA and LRAD PT Short Term Goal 4 (Week 1): Pt will initiate stair training  Skilled Therapeutic Interventions/Progress Updates:     Patient in w/c in the room upon PT arrival. Patient alert and agreeable to PT session. Patient reported 1/10 R hip pain during session, RN made aware. PT provided repositioning, rest breaks, and distraction as pain interventions throughout session.   Patient reports challenges with getting in/out of a flat bed in previous sessions. Focused session on problem solving and blocked practice of bed mobility in a flat bed without use of bed rails to simulate home set up.   Patient performed stand pivot to bed with CGA using RW with heavy reliance on upper extremities. Patient performed sit to/from supine on the R x3, as this is the side of the bed she gets out of at home, progressed from min A to CGA for trunk control. Attempted rolling through side-lying with poor tolerance from patient  due to R hip pain. Performed supine to sitting with sequencing legs off the bed then pushing up through her arms, elbows then hands, with some improvement, but increased effort x2. Performed sit to/from supine to the L x1 with supervision and increased time progressing through side-lying and use of a gait belt to lift her leg onto the bed. Patient returned to lying from sitting on the R with supervision without use of gait belt to lift her leg. Patient unsure if she will want to switch sides of the bed to the L at home. Recommend  continued trials to the R and L to assess tolerance and strategies for increased independence with bed mobility.   Patient in bed at end of session with breaks locked, bed alarm set, and all needs within reach.   Therapy Documentation Precautions:  Precautions Precautions: Fall Precaution Comments: Hard of hearing Restrictions Weight Bearing Restrictions: Yes RLE Weight Bearing: Weight bearing as tolerated    Therapy/Group: Individual Therapy  [name] PT, DPT  02/09/2022, 7:09 PM

## 2022-02-09 NOTE — Progress Notes (Signed)
PROGRESS NOTE   Subjective/Complaints:  Pt reports had bowel "explosion" yesterday x1- but abdomen feels much better- doesn't feel constipated anymore.  Slept better   ROS:  Pt denies SOB, abd pain, CP, N/V/C/D, and vision changes   Objective:   No results found.  No results for input(s): WBC, HGB, HCT, PLT in the last 72 hours.  No results for input(s): NA, K, CL, CO2, GLUCOSE, BUN, CREATININE, CALCIUM in the last 72 hours.   Intake/Output Summary (Last 24 hours) at 02/09/2022 1234 Last data filed at 02/09/2022 0738 Gross per 24 hour  Intake 300 ml  Output --  Net 300 ml        Physical Exam: Vital Signs Blood pressure (!) 153/80, pulse 78, temperature 98.3 F (36.8 C), temperature source Oral, resp. rate 18, height '5\' 4"'$  (1.626 m), weight 75.8 kg, SpO2 96 %.       General: awake, alert, appropriate, NAD HENT: conjugate gaze; oropharynx moist CV: regular rate; no JVD Pulmonary: CTA B/L; no W/R/R- good air movement GI: soft, NT, ND, (+)BS Psychiatric: appropriate Neurological: Ox3 Musculoskeletal:     Cervical back: Neck supple. No tenderness.     Comments: LUE 5/5 RUE- deltoid 2-/5- can only lift arm to 20-30 degrees abduction of flexion Otherwise 5-/5 in RUE- cannot participate in empty can test- obvious RTC tear LLE- 5/5 RLE- HF 2-/5; otherwise 5/5 in RLE  Skin: R hip has dressing in place- mod edema, no erythema- looks stable  Neurological:     General: No focal deficit present.     Mental Status: She is alert and oriented to person, place, and time.     Comments: Patient is alert.  No acute distress.  Oriented x3 and follows commands.  Assessment/Plan: 1. Functional deficits which require 3+ hours per day of interdisciplinary therapy in a comprehensive inpatient rehab setting. Physiatrist is providing close team supervision and 24 hour management of active medical problems listed  below. Physiatrist and rehab team continue to assess barriers to discharge/monitor patient progress toward functional and medical goals  Care Tool:  Bathing    Body parts bathed by patient: Right arm, Left arm, Chest, Abdomen, Front perineal area, Buttocks, Right upper leg, Left upper leg, Face   Body parts bathed by helper: Right lower leg, Left lower leg     Bathing assist Assist Level: Moderate Assistance - Patient 50 - 74%     Upper Body Dressing/Undressing Upper body dressing   What is the patient wearing?: Pull over shirt    Upper body assist Assist Level: Set up assist    Lower Body Dressing/Undressing Lower body dressing      What is the patient wearing?: Pants     Lower body assist Assist for lower body dressing: Minimal Assistance - Patient > 75%     Toileting Toileting    Toileting assist Assist for toileting: Moderate Assistance - Patient 50 - 74%     Transfers Chair/bed transfer  Transfers assist  Chair/bed transfer activity did not occur: Safety/medical concerns  Chair/bed transfer assist level: Minimal Assistance - Patient > 75%     Locomotion Ambulation   Ambulation assist  Assist level: Contact Guard/Touching assist Assistive device: Walker-rolling Max distance: 20   Walk 10 feet activity   Assist  Walk 10 feet activity did not occur: Safety/medical concerns  Assist level: Contact Guard/Touching assist Assistive device: Walker-rolling   Walk 50 feet activity   Assist Walk 50 feet with 2 turns activity did not occur: Safety/medical concerns         Walk 150 feet activity   Assist Walk 150 feet activity did not occur: Safety/medical concerns         Walk 10 feet on uneven surface  activity   Assist Walk 10 feet on uneven surfaces activity did not occur: Safety/medical concerns         Wheelchair     Assist Is the patient using a wheelchair?: Yes Type of Wheelchair: Manual    Wheelchair assist  level: Supervision/Verbal cueing Max wheelchair distance: 32f    Wheelchair 50 feet with 2 turns activity    Assist        Assist Level: Moderate Assistance - Patient 50 - 74% (per PT documentation of 35 feet supervision)   Wheelchair 150 feet activity     Assist      Assist Level: Total Assistance - Patient < 25% (per PT documentation of 35 feet supervision)   Blood pressure (!) 153/80, pulse 78, temperature 98.3 F (36.8 C), temperature source Oral, resp. rate 18, height '5\' 4"'$  (1.626 m), weight 75.8 kg, SpO2 96 %.  Medical Problem List and Plan: 1. Functional deficits secondary to impacted subcapital right hip fracture after fall.  Status post ORIF 02/02/2022.  Weightbearing as tolerated.             -patient may  shower- with Aquacel dressing in place             -ELOS/Goals: `1-14 days Supervision to mod I  Con't CIR- PT and OT 2.  Antithrombotics: -DVT/anticoagulation:  Pharmaceutical: Lovenox x6 weeks versus transition to Xarelto at discharge.  Check vascular study             -antiplatelet therapy: N/A 3. Pain Management: Hydrocodone as needed- encouraged pt to take for therapy.   5/28- pain controlled- con't regimen prn 4. Mood: Wellbutrin 300 mg daily, Adderall 30 mg twice daily, melatonin 3 mg nightly  5/25- pt asking to increase meds- will add Lexapro 5 mg daily instead of increasing wellbutrin since on Wellbutrin, Adderall and Rocephin, can lower seizure threshold. No SSRI's in allergy list.    5/26- went over changes with pt- also pt seeing neuropsych today- went over pt issues with them.              -antipsychotic agents: N/A 5. Neuropsych: This patient is capable of making decisions on her own behalf. 6. Skin/Wound Care: Routine skin checks 7. Fluids/Electrolytes/Nutrition: Routine in and out with follow-up chemistries 8.  Leukocytosis.  Empiric Rocephin.    5/25- U/A was large leuks and (+) nitrites- even though Urine Cx (-)- since had large leuks and  nitrites, will con't Rocephin for 5 days total.   5/28- IV ABX done- can remove IV 9.  Hypertension.  Norvasc 10 mg daily, Cozaar 100 mg daily.  Monitor with increased mobility  5/28- BP al ittle elevated, but hurting this AM some- waiting on meds- con't to monitor trend 10.  Hypothyroidism.  Synthroid 11.  Obesity.  BMI 28.23.  Dietary follow-up 12. R Rotator cuff tear- non surgical - pt didn't want surgery right now- pain control for  now 38. Constipation- will give Sorbitol now for BM tonight hopefully.    5/25- give MOM- see if can get results- if none by tomorrow, will check KUB  5/26- will order Sorbitol 60cc after therapy- but also ordered KUB to make sure no obstruction/ileus, since had surgery and no BM since surgery.   5/27- KUB nonobstructive- will give lactulose 30G and follow with soap suds enema. 5/28- very large BM with lactulose  14. Insomnia  5/27- will order Trazodone 25-50 mg QHS prn for sleep    LOS: 5 days A FACE TO FACE EVALUATION WAS PERFORMED  [name] 02/09/2022, 12:34 PM

## 2022-02-09 NOTE — Progress Notes (Addendum)
Physical Therapy Session Note  Patient Details  Name: [name] MRN: [id number] Date of Birth: [birth year]  Today's Date: 02/09/2022 PT Individual Time: 0902-1005; 1125-1210 PT Individual Time Calculation (min): 63 min ; 45 min  Short Term Goals: Week 1:  PT Short Term Goal 1 (Week 1): Pt will complete bed mobility with minA PT Short Term Goal 2 (Week 1): Pt will complete bed<>chair transfers with minA and LRAD PT Short Term Goal 3 (Week 1): Pt will ambulate 64f with minA and LRAD PT Short Term Goal 4 (Week 1): Pt will initiate stair training  Skilled Therapeutic Interventions/Progress Updates:  Tx 1:  Pt resting in bed.  She denied pain.  Pt inserted bil hearing aids with set up .  Therapeutic exercise performed with LE to increase strength for functional mobility.: in supine with head slightly elevated, 10 x 2 L straight leg raises, cervical flexion, bil bridging; 30 x 1 bil ankle pumps ; 10 x 1 R active assistive straight leg raise,  R quad sets, L scapular protraction punches (unable to perform with R due to RCT). Pt instructed to count aloud to avoid Valsalva maneuver.   Supine> sit to R with min assist; limited by pain R hip with movement.  Sit > stand to RW with CGA.  Gait in home setting of room x 20' x 2 to /from toilet, with mod cues for step-to technique and rolling RW far enough forward q step.  [facility], [facility] and min cues. Pt continent of urine; unable to have BM.Pt managed clothing with min cues.  Hand washing at sink in standing, min cues for locations of items.   Sit> supine with supervision.  Pt asked to to a nap.  Bed alarm set and needs left at hand.  Towel roll under R knee for comfort, in flat bed.    Tx 2:  Pt resting in bed.  She denied pain at rest.  In flat bed, not rails, pt bridged up and scooted body close to R side of bed, but still needed min assist.  Discussed possibility of sleeping on L side of bed at home.  She will  consider. PT donned pt's socks, and pt donned shoes with set up, sitting EOB.  Pt education regarding fitness for life; pt stated that there is a senior center near her that just opened, and she was open to the idea of joining.  Advanced gait training through obstacle course x 20' requiring multiple 90 degree turns, L/R sideways ambulation with RW, close supervision and cues for technique.  Up/down 5" high curb/step, using backwards technique, CGa and mod cues.  Pt stated that her 1 STE home is higher -than -average step.   At end of session, pt seated in wc with needs at hand.  Discussed safety and pt stated that she would not get out of chair without asking for staff.      Therapy Documentation Precautions:  Precautions Precautions: Fall Precaution Comments: Hard of hearing Restrictions Weight Bearing Restrictions: Yes RLE Weight Bearing: Weight bearing as tolerated      Therapy/Group: Individual Therapy  Kenrick Pore 02/09/2022, 10:12 AM

## 2022-02-09 NOTE — Progress Notes (Signed)
Occupational Therapy Session Note  Patient Details  Name: MAILE LINFORD MRN: [id number] Date of Birth: [birth year]  Today's Date: 02/09/2022 OT Individual Time: 1402-1500 OT Individual Time Calculation (min): 58 min    Short Term Goals: Week 1:  OT Short Term Goal 1 (Week 1): Pt will complete sit<>stand transfer with CGA OT Short Term Goal 2 (Week 1): Pt will complete stand pivot w/c<>BSC with CGA OT Short Term Goal 3 (Week 1): Pt will donn/doff pants with mod assist using AE as needed  Skilled Therapeutic Interventions/Progress Updates:    Patient received in reclined supine, anticipating therapist's arrival.  Patient needing mod assist to transition from supine to sit toward right side of bed.  Patient somewhat limited by right shoulder as well as report of pain at right incision site (lateral hip)  Seated at edge of bed - worked on doffing and donning shoes and socks using reacher, sock aide, and [place].  Patient transferred to wheelchair with min assist and increased time - cueing to stand with increased extension.  Walked to dayroom and addressed Rotator cuff injury to right shoulder - gentle table slides to preserve motion and promote active assisted range of motion.   Transported patient to ADL apartment to address tub/shower transfer with tub transfer bench.  Patient did well following demonstration, only needing assistance to manage RLE into and out of tub.   Back to room, safety belt in place and engaged, call bell and personal items within reach.    Therapy Documentation Precautions:  Precautions Precautions: Fall Precaution Comments: Hard of hearing Restrictions Weight Bearing Restrictions: Yes RLE Weight Bearing: Weight bearing as tolerated  Pain:  4/10 right hip, right shoulder    Therapy/Group: Individual Therapy  Mariah Milling 02/09/2022, 3:15 PM

## 2022-02-09 NOTE — Progress Notes (Signed)
Daily Progress Note   Patient Name: [name]       Date: 02/09/2022 DOB: 1941/07/02  Age: 81 y.o. MRN#: [id number] Attending Physician: Courtney Heys, MD Primary Care Physician: Celene Squibb, MD Admit Date: 02/04/2022  Reason for Consultation/Follow-up: Establishing goals of care  Subjective: Medical records reviewed including progress notes, labs. Patient assessed at the bedside. She is sleeping but arouses easily to voice. No concerns today, awaiting therapy.  Followed up on previous conversation regarding advanced directives and Tayelor tells me they have been unable to discuss her decisions yet.  She shares her concern that signing these forms will inadvertently leave the hospital as a beneficiary should anything happen to her, as she has heard this from friends.  Provided education on the purpose and legal implications of HCPOA designation, living will, MOST form and patient verbalizes her understanding.  Reassured her that if she is not ready to complete during her hospital stay, outpatient palliative care will continue to be appropriate for ongoing discussions at home.  We discussed her thoughts and feelings on her current illness and she continues to think about how she will do at home, wanting to stay as active as she previously was and worrying about what will happen if she falls into habits of moving around without thinking about her injury.  Emotional support and therapeutic listening was provided.  Questions and concerns addressed.  PMT will continue to support holistically.   Length of Stay: 5  Physical Exam Vitals and nursing note reviewed.  Constitutional:      General: She is not in acute distress. HENT:     Head: Normocephalic and atraumatic.  Cardiovascular:      Rate and Rhythm: Normal rate.  Pulmonary:     Effort: Pulmonary effort is normal. No respiratory distress.  Skin:    General: Skin is warm and dry.  Neurological:     Mental Status: She is alert.  Psychiatric:        Mood and Affect: Mood normal.        Behavior: Behavior is cooperative.            Vital Signs: BP (!) 153/80 (BP Location: Right Leg)   Pulse 78   Temp 98.3 F (36.8 C) (Oral)   Resp 18   Ht '5\' 4"'$  (1.626  m)   Wt 75.8 kg   SpO2 96%   BMI 28.67 kg/m  SpO2: SpO2: 96 % O2 Device: O2 Device: Room Air O2 Flow Rate:        Palliative Assessment/Data: 50%     Palliative Care Assessment & Plan   Patient Profile: 81 y.o. female  with past medical history of hypertension, hypothyroidism, osteopenia, anxiety/depression, GERD admitted on 02/01/2022 with fall onto right side.    Patient is s/p right subcapital femoral fracture. PMT has been consulted to assist with goals of care conversation.    Assessment: right subcapital femoral fracture Goals of care conversation  Recommendations/Plan: DNR gold form signed and placed on patient's hard chart. Will scan copy into [facility] Continues to express goal of returning home with active lifestyle as prior to admission Ongoing education on AD/Living Will, MOST form PMT will continue to follow incrementally. Family has contact information if ready to proceed with documentation or discuss GOC further.    Prognosis:  Unable to determine  Discharge Planning: To Be Determined    Total time: I spent 35 minutes in the care of the patient today in the above activities and documenting the encounter.   Tip Atienza Johnnette Litter, PA-C  Palliative Medicine Team Team phone # [phone]  Thank you for allowing the Palliative Medicine Team to assist in the care of this patient. Please utilize secure chat with additional questions, if there is no response within 30 minutes please call the above phone number.  Palliative Medicine  Team providers are available by phone from 7am to 7pm daily and can be reached through the team cell phone.  Should this patient require assistance outside of these hours, please call the patient's attending physician.

## 2022-02-09 NOTE — Progress Notes (Addendum)
Patient given PRN trazodone '50mg'$  last night at 2113. As of 0030 patient awake and reports still unable to sleep. Patient denies pain. May need medication adjustment. Continue to monitor.    0320 Poor sleep continues to be noted. Awakens easily. No other complaints.

## 2022-02-10 DIAGNOSIS — G4700 Insomnia, unspecified: Secondary | ICD-10-CM

## 2022-02-10 DIAGNOSIS — S72011D Unspecified intracapsular fracture of right femur, subsequent encounter for closed fracture with routine healing: Principal | ICD-10-CM

## 2022-02-10 DIAGNOSIS — K5900 Constipation, unspecified: Secondary | ICD-10-CM

## 2022-02-10 DIAGNOSIS — I10 Essential (primary) hypertension: Secondary | ICD-10-CM

## 2022-02-10 NOTE — Progress Notes (Signed)
Recreational Therapy Session Note  Patient Details  Name: [name] MRN: [id number] Date of Birth: [birth year] Today's Date: 02/10/2022  Pt participated in animal assisted activity bed level with supervision.  Pt stated she felt down today but easily smiled with pet partner team.  Pt shared she had 2 dogs at home that she missed.  Pt appreciative of this visit.  Per policy, hand hygiene performed prior to and after pet interaction.   [name] 02/10/2022, 1:57 PM

## 2022-02-10 NOTE — Progress Notes (Signed)
PROGRESS NOTE   Subjective/Complaints:  No new concerns or complaints this AM. Last BM yesterday.   ROS:  Pt denies Fever, Chills, SOB, abd pain, CP, N/V/C/D, and vision changes   Objective:   No results found.  No results for input(s): WBC, HGB, HCT, PLT in the last 72 hours.  No results for input(s): NA, K, CL, CO2, GLUCOSE, BUN, CREATININE, CALCIUM in the last 72 hours.   Intake/Output Summary (Last 24 hours) at 02/10/2022 1310 Last data filed at 02/10/2022 0854 Gross per 24 hour  Intake 600 ml  Output --  Net 600 ml         Physical Exam: Vital Signs Blood pressure (!) 152/86, pulse 79, temperature 98.1 F (36.7 C), temperature source Oral, resp. rate 18, height '5\' 4"'$  (1.626 m), weight 75.8 kg, SpO2 96 %.       General: awake, alert, appropriate, NAD HENT: conjugate gaze; oropharynx moist CV: regular rate; no JVD Pulmonary: CTAB, normal effort GI: soft, NT, ND, (+)BS Psychiatric: appropriate, pleasant Neurological: Ox3 Musculoskeletal:     Cervical back: Neck supple. No tenderness.     Comments: LUE 5/5 RUE- deltoid 2-/5- can only lift arm to 20-30 degrees abduction of flexion Otherwise 5-/5 in RUE- cannot participate in empty can test- obvious RTC tear LLE- 5/5 RLE- HF 2-/5; otherwise 5/5 in RLE  Skin: R hip has dressing in place- mod edema, no erythema- looks stable  Neurological:     General: No focal deficit present.     Mental Status: She is alert and oriented to person, place, and time.     Comments: Patient is alert.  No acute distress.   Assessment/Plan: 1. Functional deficits which require 3+ hours per day of interdisciplinary therapy in a comprehensive inpatient rehab setting. Physiatrist is providing close team supervision and 24 hour management of active medical problems listed below. Physiatrist and rehab team continue to assess barriers to discharge/monitor patient progress  toward functional and medical goals  Care Tool:  Bathing    Body parts bathed by patient: Right arm, Left arm, Chest, Abdomen, Front perineal area, Buttocks, Right upper leg, Left upper leg, Face   Body parts bathed by helper: Right lower leg, Left lower leg     Bathing assist Assist Level: Moderate Assistance - Patient 50 - 74%     Upper Body Dressing/Undressing Upper body dressing   What is the patient wearing?: Pull over shirt    Upper body assist Assist Level: Set up assist    Lower Body Dressing/Undressing Lower body dressing      What is the patient wearing?: Pants     Lower body assist Assist for lower body dressing: Minimal Assistance - Patient > 75%     Toileting Toileting    Toileting assist Assist for toileting: Moderate Assistance - Patient 50 - 74%     Transfers Chair/bed transfer  Transfers assist  Chair/bed transfer activity did not occur: Safety/medical concerns  Chair/bed transfer assist level: Minimal Assistance - Patient > 75%     Locomotion Ambulation   Ambulation assist      Assist level: Contact Guard/Touching assist Assistive device: Walker-rolling Max distance: 20  Walk 10 feet activity   Assist  Walk 10 feet activity did not occur: Safety/medical concerns  Assist level: Contact Guard/Touching assist Assistive device: Walker-rolling   Walk 50 feet activity   Assist Walk 50 feet with 2 turns activity did not occur: Safety/medical concerns         Walk 150 feet activity   Assist Walk 150 feet activity did not occur: Safety/medical concerns         Walk 10 feet on uneven surface  activity   Assist Walk 10 feet on uneven surfaces activity did not occur: Safety/medical concerns         Wheelchair     Assist Is the patient using a wheelchair?: Yes Type of Wheelchair: Manual    Wheelchair assist level: Supervision/Verbal cueing Max wheelchair distance: 44f    Wheelchair 50 feet with 2 turns  activity    Assist        Assist Level: Moderate Assistance - Patient 50 - 74% (per PT documentation of 35 feet supervision)   Wheelchair 150 feet activity     Assist      Assist Level: Total Assistance - Patient < 25% (per PT documentation of 35 feet supervision)   Blood pressure (!) 152/86, pulse 79, temperature 98.1 F (36.7 C), temperature source Oral, resp. rate 18, height '5\' 4"'$  (1.626 m), weight 75.8 kg, SpO2 96 %.  Medical Problem List and Plan: 1. Functional deficits secondary to impacted subcapital right hip fracture after fall.  Status post ORIF 02/02/2022.  Weightbearing as tolerated.             -patient may  shower- with Aquacel dressing in place             -ELOS/Goals: `1-14 days Supervision to mod I  Con't CIR- PT and OT  -Walked 120 ft today with RW 2.  Antithrombotics: -DVT/anticoagulation:  Pharmaceutical: Lovenox x6 weeks versus transition to Xarelto at discharge.  Check vascular study             -antiplatelet therapy: N/A 3. Pain Management: Hydrocodone as needed- encouraged pt to take for therapy.   5/28- pain controlled- con't regimen prn 4. Mood: Wellbutrin 300 mg daily, Adderall 30 mg twice daily, melatonin 3 mg nightly  5/25- pt asking to increase meds- will add Lexapro 5 mg daily instead of increasing wellbutrin since on Wellbutrin, Adderall and Rocephin, can lower seizure threshold. No SSRI's in allergy list.    5/26- went over changes with pt- also pt seeing neuropsych today- went over pt issues with them.              -antipsychotic agents: N/A 5. Neuropsych: This patient is capable of making decisions on her own behalf. 6. Skin/Wound Care: Routine skin checks 7. Fluids/Electrolytes/Nutrition: Routine in and out with follow-up chemistries 8.  Leukocytosis.  Empiric Rocephin.    5/25- U/A was large leuks and (+) nitrites- even though Urine Cx (-)- since had large leuks and nitrites, will con't Rocephin for 5 days total.   5/28- IV ABX done-  can remove IV 9.  Hypertension.  Norvasc 10 mg daily, Cozaar 100 mg daily.  Monitor with increased mobility  5/28- BP al ittle elevated, but hurting this AM some- waiting on meds- con't to monitor trend  5/29 BP normotensive last check, continue to monitor trend 10.  Hypothyroidism.  Synthroid 11.  Obesity.  BMI 28.23.  Dietary follow-up 12. R Rotator cuff tear- non surgical - pt didn't want surgery right  now- pain control for now 13. Constipation- will give Sorbitol now for BM tonight hopefully.    5/25- give MOM- see if can get results- if none by tomorrow, will check KUB  5/26- will order Sorbitol 60cc after therapy- but also ordered KUB to make sure no obstruction/ileus, since had surgery and no BM since surgery.   5/27- KUB nonobstructive- will give lactulose 30G and follow with soap suds enema. 5/28- very large BM with lactulose  5/29 Reports she feels better after BM yesterday 14. Insomnia  5/27- will order Trazodone 25-50 mg QHS prn for sleep  5/29 Pt using PRN Trazodone for sleep, continue current regimen     LOS: 6 days A FACE TO [place] 02/10/2022, 1:10 PM

## 2022-02-10 NOTE — Progress Notes (Signed)
Occupational Therapy Session Note  Patient Details  Name: [name] MRN: [id number] Date of Birth: Nov 12, 1940  Today's Date: 02/10/2022 OT Group Time: 8242-3536 OT Group Time Calculation (min): 60 min   Short Term Goals: Week 1:  OT Short Term Goal 1 (Week 1): Pt will complete sit<>stand transfer with CGA OT Short Term Goal 2 (Week 1): Pt will complete stand pivot w/c<>BSC with CGA OT Short Term Goal 3 (Week 1): Pt will donn/doff pants with mod assist using AE as needed  Skilled Therapeutic Interventions/Progress Updates:  Session focus on group education session to follow up on previous stress mgmt session. Reviewed previous topic of how to categorize stressors into "daily hassles," "major life stressors" and "life circumstances" in an effort to allow pts to chunk their stressors into groups and determine where to best put their efforts/time when dealing with stress. Additionally reviewed protective factors to manage our stressors such as "daily uplifts," "healthy coping skills," and "protective factors." Reviewed healthy vs unhealthy coping strategies. Practiced healthy coping strategy of deep breathing, education provided on resources that can be used to help facilitate improved deep breathing strategies.  Provided new information of how to use gratitude as a coping strategy for stress mgmt. education provided on how to start a gratitude journal during LOS in an effort to manage stressors and bring awareness to things that are going well in your life. Education provided on idea of completing a gratitude card/ letter to someone who you are currently grateful for.  Challenged all pts to share something they are currently grateful for with pt stating "this facility." Handouts provided to reinforce all education.  Pt transported back to room by RT.    Therapy Documentation Precautions:  Precautions Precautions: Fall Precaution Comments: Hard of hearing Restrictions Weight Bearing  Restrictions: Yes RLE Weight Bearing: Weight bearing as tolerated  Pain:no pain reported during session     Therapy/Group: Group Therapy  [name] 02/10/2022, 4:18 PM

## 2022-02-10 NOTE — Progress Notes (Signed)
Physical Therapy Session Note  Patient Details  Name: [name] MRN: [id number] Date of Birth: 08-12-1941  Today's Date: 02/10/2022 PT Individual Time: 0805-0900 PT Individual Time Calculation (min): 55 min   Short Term Goals: Week 1:  PT Short Term Goal 1 (Week 1): Pt will complete bed mobility with minA PT Short Term Goal 2 (Week 1): Pt will complete bed<>chair transfers with minA and LRAD PT Short Term Goal 3 (Week 1): Pt will ambulate 66f with minA and LRAD PT Short Term Goal 4 (Week 1): Pt will initiate stair training  Skilled Therapeutic Interventions/Progress Updates: PT presented in bed with MD and nsg present agreeable to therapy. Pt denies pain at rest some increase with mobility but did not rate. Pt performed ankle pumps, QS, heel slides, and hip abd/add x 10-15 prior to bed mobility. Required modA for supine to sit with use of bed features. PTA assisted in donning socks, thread pants, and donning shoes for time management. Donned top with set up. Performed Sit to stand initially with modA fading to minA to pull pants over hips and then to perform step pivot transfer to w/c. At w/c moved to sink to perform oral hygiene mod I. Pt then transported to rehab gym and performed Sit to stand from w/c x 10 for BLE strengthening. Performed teo taps to 4in step, x5 RLE, x5 LLE, then x 5 alternating. Pt required max cues for increased wt bearing on RLE as pt initially noted to only have forefoot on ground and heavy reliance on BUE support. Provided education to pt regarding WBAT and importance to increase wt bearing tolerance for healing. Pt verbalized understanding. Pt then ambulated ~1257fwith RW and CGA fading to close S. Pt able to demonstrate step through pattern but decreased wt bearing of RLE with step through of LLE. Participated in stair training performing step up x 3 with B rails. Pt able to perform with CGA however required max cues to step up with LLE as pt would repeatedly  attempt to place RLE on step. Pt transported back to room at end of session and agreeable to sit in recliner vs returning to bed. Performed ambulatory transfer to recliner with RW and CGA. Pt left in recliner at end of session with call bell within reach and needs met.      Therapy Documentation Precautions:  Precautions Precautions: Fall Precaution Comments: Hard of hearing Restrictions Weight Bearing Restrictions: Yes RLE Weight Bearing: Weight bearing as tolerated General:   Vital Signs:  Pain: Pain Assessment Pain Scale: 0-10 Pain Score: 3  Pain Type: Surgical pain Pain Location: Groin Pain Orientation: Right Pain Radiating Towards: and continues to thigh Pain Descriptors / Indicators: Dull Pain Frequency: Constant Pain Onset: On-going Pain Intervention(s): Medication (See eMAR) Mobility:   Locomotion :    Trunk/Postural Assessment :    Balance:   Exercises:   Other Treatments:      Therapy/Group: Individual Therapy  [name] 02/10/2022, 11:49 AM

## 2022-02-10 NOTE — Progress Notes (Signed)
Occupational Therapy Session Note  Patient Details  Name: [name] MRN: [id number] Date of Birth: 07/13/1941  Today's Date: 02/10/2022 OT Individual Time: 1055-1120 OT Individual Time Calculation (min): 25 min  and Today's Date: 02/10/2022 OT Missed Time: 50 Minutes Missed Time Reason: Patient unwilling/refused to participate without medical reason  Short Term Goals: Week 1:  OT Short Term Goal 1 (Week 1): Pt will complete sit<>stand transfer with CGA OT Short Term Goal 2 (Week 1): Pt will complete stand pivot w/c<>BSC with CGA OT Short Term Goal 3 (Week 1): Pt will donn/doff pants with mod assist using AE as needed  Skilled Therapeutic Interventions/Progress Updates:    Pt greeted semi-reclined in bed finishing up petting therapy dog. Pt reported feeling down today. She stated she did not feel joy and that she felt like her brain chemistry was off. Pt stated " I get this way sometimes." OT utilized therapeutic use of self to validate patients feelings and encourage her. Pt reported she just keeps thinking about all that is not getting done. OT encouraged her and tried to get her to focus on her healing. Pt reported she did not feel like getting out of bed right now. OT brought pt some coloring books and markers to try to brighten her mood and pt appreciative. Pt missed 50 minutes of OT treatment session due to refusal. Pt left semi-reclined in bed with needs met.  Therapy Documentation Precautions:  Precautions Precautions: Fall Precaution Comments: Hard of hearing Restrictions Weight Bearing Restrictions: Yes RLE Weight Bearing: Weight bearing as tolerated General: General OT Amount of Missed Time: 50 Minutes   Therapy/Group: Individual Therapy  Valma Cava 02/10/2022, 12:52 PM

## 2022-02-10 NOTE — Progress Notes (Signed)
Recreational Therapy Session Note  Patient Details  Name: [name] MRN: [id number] Date of Birth: 11/08/1940 Today's Date: 02/10/2022  Pain: no c/o Skilled Therapeutic Interventions/Progress Updates: Session was a follow up to last week's stress management/coping session per group requests.  Group members requested review of previously discussed stress exploration topics.  Reviewed and listed factors that contribute to stress and factors that protect against stress..  Emphasis this week on providing more in depth instructions/information on deep breathing, imagery, challenging irrational thoughts and gratitude.  Hand out provided.  Pt actively participated throughout group session sharing thoughts, feelings, experiences.  Pt express appreciation for another group session on this topic and with the same participants from last week.    [place] 02/10/2022, 4:26 PM

## 2022-02-11 MED ORDER — SENNA 8.6 MG PO TABS
2.0000 | ORAL_TABLET | Freq: Two times a day (BID) | ORAL | Status: DC
  Administered 2022-02-12 – 2022-02-19 (×15): 17.2 mg via ORAL
  Filled 2022-02-11 (×16): qty 2

## 2022-02-11 MED ORDER — SORBITOL 70 % SOLN
45.0000 mL | Freq: Once | Status: AC
  Administered 2022-02-11: 45 mL via ORAL
  Filled 2022-02-11: qty 60

## 2022-02-11 MED ORDER — ESCITALOPRAM OXALATE 10 MG PO TABS
10.0000 mg | ORAL_TABLET | Freq: Every day | ORAL | Status: DC
  Administered 2022-02-12 – 2022-02-19 (×8): 10 mg via ORAL
  Filled 2022-02-11 (×8): qty 1

## 2022-02-11 NOTE — Progress Notes (Signed)
[facility]) Hospital Liaison note:  This patient has been referred to [facility] Outpatient  Palliative Care services. Will continue to follow for disposition.  Please call with any outpatient palliative questions or concerns.  Thank you for the opportunity to participate in this patient's care.   Thank you, Lorelee Market, LPN [facility] Liaison [phone]

## 2022-02-11 NOTE — Plan of Care (Signed)
  Problem: Consults Goal: RH GENERAL PATIENT EDUCATION Description: See Patient Education module for education specifics. 02/11/2022 2022 by Ardelia Mems, RN Outcome: Progressing 02/11/2022 1657 by Ardelia Mems, RN Outcome: Progressing Goal: Skin Care Protocol Initiated - if Braden Score 18 or less Description: If consults are not indicated, leave blank or document N/A 02/11/2022 2022 by Ardelia Mems, RN Outcome: Progressing 02/11/2022 1657 by Ardelia Mems, RN Outcome: Progressing   Problem: RH BOWEL ELIMINATION Goal: RH STG MANAGE BOWEL WITH ASSISTANCE Description: STG Manage Bowel with Supervision Assistance. 02/11/2022 2022 by Ardelia Mems, RN Outcome: Progressing 02/11/2022 1657 by Ardelia Mems, RN Outcome: Progressing Goal: RH STG MANAGE BOWEL W/MEDICATION W/ASSISTANCE Description: STG Manage Bowel with Medication with Supervision Assistance. 02/11/2022 2022 by Ardelia Mems, RN Outcome: Progressing 02/11/2022 1657 by Ardelia Mems, RN Outcome: Progressing   Problem: RH BLADDER ELIMINATION Goal: RH STG MANAGE BLADDER WITH ASSISTANCE Description: STG Manage Bladder With Supervision Assistance 02/11/2022 2022 by Ardelia Mems, RN Outcome: Progressing 02/11/2022 1657 by Ardelia Mems, RN Outcome: Progressing Goal: RH STG MANAGE BLADDER WITH MEDICATION WITH ASSISTANCE Description: STG Manage Bladder With Medication With Supervision Assistance. 02/11/2022 2022 by Ardelia Mems, RN Outcome: Progressing 02/11/2022 1657 by Ardelia Mems, RN Outcome: Progressing   Problem: RH SKIN INTEGRITY Goal: RH STG MAINTAIN SKIN INTEGRITY WITH ASSISTANCE Description: STG Maintain Skin Integrity With Supervision Assistance. 02/11/2022 2022 by Ardelia Mems, RN Outcome: Progressing 02/11/2022 1657 by Ardelia Mems, RN Outcome: Progressing Goal: RH STG ABLE TO PERFORM INCISION/WOUND CARE  W/ASSISTANCE Description: STG Able To Perform Incision/Wound Care With Supervision Assistance. 02/11/2022 2022 by Ardelia Mems, RN Outcome: Progressing 02/11/2022 1657 by Ardelia Mems, RN Outcome: Progressing   Problem: RH SAFETY Goal: RH STG ADHERE TO SAFETY PRECAUTIONS W/ASSISTANCE/DEVICE Description: STG Adhere to Safety Precautions With Cues and Reminders. 02/11/2022 2022 by Ardelia Mems, RN Outcome: Progressing 02/11/2022 1657 by Ardelia Mems, RN Outcome: Progressing Goal: RH STG DECREASED RISK OF FALL WITH ASSISTANCE Description: STG Decreased Risk of Fall With Supervision Assistance. 02/11/2022 2022 by Ardelia Mems, RN Outcome: Progressing 02/11/2022 1657 by Ardelia Mems, RN Outcome: Progressing   Problem: RH PAIN MANAGEMENT Goal: RH STG PAIN MANAGED AT OR BELOW PT'S PAIN GOAL Description: < 3 on a 0-10 pain scale. 02/11/2022 2022 by Ardelia Mems, RN Outcome: Progressing 02/11/2022 1657 by Ardelia Mems, RN Outcome: Progressing   Problem: RH KNOWLEDGE DEFICIT GENERAL Goal: RH STG INCREASE KNOWLEDGE OF SELF CARE AFTER HOSPITALIZATION Description: Patient will demonstrate knowledge of self-care management, medication/pain management, skin/wound care, weight bearing precautions with educational materials and handouts provided by staff independently at discharge. 02/11/2022 2022 by Ardelia Mems, RN Outcome: Progressing 02/11/2022 1657 by Ardelia Mems, RN Outcome: Progressing

## 2022-02-11 NOTE — Progress Notes (Signed)
Occupational Therapy Session Note  Patient Details  Name: [name] MRN: [id number] Date of Birth: [birth year]  Today's Date: 02/11/2022 OT Individual Time: 2992-4268 OT Individual Time Calculation (min): 70 min    Short Term Goals: Week 1:  OT Short Term Goal 1 (Week 1): Pt will complete sit<>stand transfer with CGA OT Short Term Goal 2 (Week 1): Pt will complete stand pivot w/c<>BSC with CGA OT Short Term Goal 3 (Week 1): Pt will donn/doff pants with mod assist using AE as needed  Skilled Therapeutic Interventions/Progress Updates:    Patient received sitting up in wheelchair with chuck pad wrapped around shoulders.  When asked why - she indicated she was cold.  Patient given blanket.  Patient verbalizing anxiety about having therapy back to back - reviewed schedule and patient seemed to understand.  Patient moans when transitioning from sit to stand or stand to sit, but when asked to score pain she indicates 2.  With walking - patient indicates pain in right femur increases to 4.  Skilled OT intervention today focused on functional mobility, and problem solving challenges within her home environment.   Practiced stepping over small lip as needed to exit to deck from kitchen.  Patient enjoys taking meals out on deck when weather permits.   Practiced sitting in chair without arms - patient hesitant when unable to touch to arm rest, and needed assistance to stand from chair.   Practiced management of doors, walking on carpet, tile and transitions.  Worked on changing direction, backing up, etc within confines of functional activity.   Patient issued walker bag and practiced carrying small items in bag - to allow her to maintain two hands on walker.   Patient returned to her room, transferred back to bed per her request.  Bed alarm and call bell in place. Room phone unplugged per patient's request to allow her to rest.    Therapy Documentation Precautions:  Precautions Precautions:  Fall Precaution Comments: Hard of hearing Restrictions Weight Bearing Restrictions: Yes RLE Weight Bearing: Weight bearing as tolerated  Pain: Pain Assessment Pain Scale: 0-10 Pain Score: 2 Pain Type: Acute pain Pain Location: Hip Pain Orientation: Right Pain Radiating Towards: right leg Pain Descriptors / Indicators: Aching Pain Frequency: Intermittent Pain Onset: Gradual Patients Stated Pain Goal: 2 Pain Intervention(s): Medication (See eMAR) Multiple Pain Sites: No     Therapy/Group: Individual Therapy  Mariah Milling 02/11/2022, 12:13 PM

## 2022-02-11 NOTE — Plan of Care (Signed)
  Problem: Consults Goal: RH GENERAL PATIENT EDUCATION Description: See Patient Education module for education specifics. Outcome: Progressing Goal: Skin Care Protocol Initiated - if Braden Score 18 or less Description: If consults are not indicated, leave blank or document N/A Outcome: Progressing   Problem: RH BOWEL ELIMINATION Goal: RH STG MANAGE BOWEL WITH ASSISTANCE Description: STG Manage Bowel with Supervision Assistance. Outcome: Progressing Goal: RH STG MANAGE BOWEL W/MEDICATION W/ASSISTANCE Description: STG Manage Bowel with Medication with Supervision Assistance. Outcome: Progressing   Problem: RH BLADDER ELIMINATION Goal: RH STG MANAGE BLADDER WITH ASSISTANCE Description: STG Manage Bladder With Supervision Assistance Outcome: Progressing Goal: RH STG MANAGE BLADDER WITH MEDICATION WITH ASSISTANCE Description: STG Manage Bladder With Medication With Supervision Assistance. Outcome: Progressing   Problem: RH SKIN INTEGRITY Goal: RH STG MAINTAIN SKIN INTEGRITY WITH ASSISTANCE Description: STG Maintain Skin Integrity With Supervision Assistance. Outcome: Progressing Goal: RH STG ABLE TO PERFORM INCISION/WOUND CARE W/ASSISTANCE Description: STG Able To Perform Incision/Wound Care With Supervision Assistance. Outcome: Progressing   Problem: RH SAFETY Goal: RH STG ADHERE TO SAFETY PRECAUTIONS W/ASSISTANCE/DEVICE Description: STG Adhere to Safety Precautions With Cues and Reminders. Outcome: Progressing Goal: RH STG DECREASED RISK OF FALL WITH ASSISTANCE Description: STG Decreased Risk of Fall With Supervision Assistance. Outcome: Progressing   Problem: RH PAIN MANAGEMENT Goal: RH STG PAIN MANAGED AT OR BELOW PT'S PAIN GOAL Description: < 3 on a 0-10 pain scale. Outcome: Progressing   Problem: RH KNOWLEDGE DEFICIT GENERAL Goal: RH STG INCREASE KNOWLEDGE OF SELF CARE AFTER HOSPITALIZATION Description: Patient will demonstrate knowledge of self-care management,  medication/pain management, skin/wound care, weight bearing precautions with educational materials and handouts provided by staff independently at discharge. Outcome: Progressing

## 2022-02-11 NOTE — Progress Notes (Signed)
Occupational Therapy Session Note  Patient Details  Name: [name] MRN: [id number] Date of Birth: 05/09/1941  Today's Date: 02/11/2022 OT Individual Time: 1300-1400 OT Individual Time Calculation (min): 60 min    Short Term Goals: Week 1:  OT Short Term Goal 1 (Week 1): Pt will complete sit<>stand transfer with CGA OT Short Term Goal 2 (Week 1): Pt will complete stand pivot w/c<>BSC with CGA OT Short Term Goal 3 (Week 1): Pt will donn/doff pants with mod assist using AE as needed  Skilled Therapeutic Interventions/Progress Updates:    S: My right shoulder has been giving me some problems for a few years; even before I fell.    O: - Manual Therapy: Completed prior to exercises. Myofascial release completed to the right upper arm, upper trapezius, and scapularis region to decrease fascial restrictions and increase joint mobility in a pain free zone.  - P/ROM: shoulder, right, semi reclined position, flexion, horizontal abduction/adduction, IR/er, 10X - AA/ROM: shoulder, right, semi reclined position, 1# dowel, protraction, flexion (less than shoulder level), IR/er, horizontal abduction/adduction, 10X - Isometrics: shoulder, semi reclined, right, flexion, extension, IR, er, abduction, adduction, 3x5"     A: Patient in bed upon therapy arrival and agreeable to participate in therapy session. Myofascial release completed to address minimal fascial restrictions in the right upper arm and upper trapezius region. During passive ROM, crepitus heard and felt with shoulder instability present. Exercises modified to a lower range to increase comfort level. Patient was able to complete some AA/ROM exercises using 1# dowel rod and isometrics. VC for form and technique were provided during session.    P: Continue with gentle ROM exercises with the RUE (table slides, isometrics, etc)  Therapy Documentation Precautions:  Precautions Precautions: Fall Precaution Comments:  HOH Restrictions Weight Bearing Restrictions: No RLE Weight Bearing: Weight bearing as tolerated PAIN: Pain Assessment Pain Scale: 0-10 Pain Score: 4  (with movement) Pain Type: Acute pain Pain Location: Shoulder (and Hip) Pain Orientation: Right Pain Radiating Towards: right leg    Therapy/Group: Individual Therapy  Ailene Ravel, OTR/L,CBIS  Supplemental OT - [facility]  02/11/2022, 1:09 PM

## 2022-02-11 NOTE — Progress Notes (Signed)
Occupational Therapy Session Note  Patient Details  Name: [name] MRN: [id number] Date of Birth: [birth year]  Today's Date: 02/11/2022 OT Individual Time: 4132-4401 OT Individual Time Calculation (min): 75 min    Short Term Goals: Week 1:  OT Short Term Goal 1 (Week 1): Pt will complete sit<>stand transfer with CGA OT Short Term Goal 2 (Week 1): Pt will complete stand pivot w/c<>BSC with CGA OT Short Term Goal 3 (Week 1): Pt will donn/doff pants with mod assist using AE as needed  Skilled Therapeutic Interventions/Progress Updates:    Pt resting in bed upon arrival and agreeable to getting OOB for bathing/dressing. Supine>sit EOB with min A and HOB elevated. Sit>stand from EOB with min A. Pt amb with RW to bathroom to use toilet-CGA. Pt completed bathing with CGA/min A with sit<>stand from w/c at sink. Dressing with min A. Standing balance with CGA. Pt with limited RUE AROM 2/2 suspected RTC tear. Pt remained seated in w/c with all needs within reach. Belt alarm activated.   Therapy Documentation Precautions:  Precautions Precautions: Fall Precaution Comments: Hard of hearing Restrictions Weight Bearing Restrictions: Yes RLE Weight Bearing: Weight bearing as tolerated  Pain:  Pt reports "no pain" in RLE when at rest but 8/10 with WB; repositioned   Therapy/Group: Individual Therapy  Leroy Libman 02/11/2022, 10:31 AM

## 2022-02-11 NOTE — Progress Notes (Signed)
PROGRESS NOTE   Subjective/Complaints:  Pt reports LBM Saturday evening/overnight- Pain controlled with regimen No complaints ROS:    Pt denies SOB, abd pain, CP, N/V/ (+) C/D, and vision changes  Objective:   No results found.  No results for input(s): WBC, HGB, HCT, PLT in the last 72 hours.  No results for input(s): NA, K, CL, CO2, GLUCOSE, BUN, CREATININE, CALCIUM in the last 72 hours.   Intake/Output Summary (Last 24 hours) at 02/11/2022 0813 Last data filed at 02/10/2022 1840 Gross per 24 hour  Intake 840 ml  Output --  Net 840 ml        Physical Exam: Vital Signs Blood pressure (!) 160/68, pulse 70, temperature 98.2 F (36.8 C), temperature source Oral, resp. rate 18, height '5\' 4"'$  (1.626 m), weight 75.8 kg, SpO2 96 %.        General: awake, alert, appropriate, sitting up in bed eating breakfast;  NAD HENT: conjugate gaze; oropharynx moist CV: regular rate; no JVD Pulmonary: CTA B/L; no W/R/R- good air movement GI: soft, NT, ND, (+)BS but a little hypoactive Psychiatric: appropriate Neurological: Ox3 Musculoskeletal:     Cervical back: Neck supple. No tenderness.     Comments: LUE 5/5 RUE- deltoid 2-/5- can only lift arm to 20-30 degrees abduction of flexion Otherwise 5-/5 in RUE- cannot participate in empty can test- obvious RTC tear- no change LLE- 5/5 RLE- HF 2-/5; otherwise 5/5 in RLE  Skin: R hip has dressing in place- mod edema, no erythema- looks stable  Neurological:     General: No focal deficit present.     Mental Status: She is alert and oriented to person, place, and time.     Comments: Patient is alert.  No acute distress.   Assessment/Plan: 1. Functional deficits which require 3+ hours per day of interdisciplinary therapy in a comprehensive inpatient rehab setting. Physiatrist is providing close team supervision and 24 hour management of active medical problems listed  below. Physiatrist and rehab team continue to assess barriers to discharge/monitor patient progress toward functional and medical goals  Care Tool:  Bathing    Body parts bathed by patient: Right arm, Left arm, Chest, Abdomen, Front perineal area, Buttocks, Right upper leg, Left upper leg, Face   Body parts bathed by helper: Right lower leg, Left lower leg     Bathing assist Assist Level: Moderate Assistance - Patient 50 - 74%     Upper Body Dressing/Undressing Upper body dressing   What is the patient wearing?: Pull over shirt    Upper body assist Assist Level: Set up assist    Lower Body Dressing/Undressing Lower body dressing      What is the patient wearing?: Pants     Lower body assist Assist for lower body dressing: Minimal Assistance - Patient > 75%     Toileting Toileting    Toileting assist Assist for toileting: Moderate Assistance - Patient 50 - 74%     Transfers Chair/bed transfer  Transfers assist  Chair/bed transfer activity did not occur: Safety/medical concerns  Chair/bed transfer assist level: Minimal Assistance - Patient > 75%     Locomotion Ambulation   Ambulation assist  Assist level: Contact Guard/Touching assist Assistive device: Walker-rolling Max distance: 20   Walk 10 feet activity   Assist  Walk 10 feet activity did not occur: Safety/medical concerns  Assist level: Contact Guard/Touching assist Assistive device: Walker-rolling   Walk 50 feet activity   Assist Walk 50 feet with 2 turns activity did not occur: Safety/medical concerns         Walk 150 feet activity   Assist Walk 150 feet activity did not occur: Safety/medical concerns         Walk 10 feet on uneven surface  activity   Assist Walk 10 feet on uneven surfaces activity did not occur: Safety/medical concerns         Wheelchair     Assist Is the patient using a wheelchair?: Yes Type of Wheelchair: Manual    Wheelchair assist  level: Supervision/Verbal cueing Max wheelchair distance: 91f    Wheelchair 50 feet with 2 turns activity    Assist        Assist Level: Moderate Assistance - Patient 50 - 74% (per PT documentation of 35 feet supervision)   Wheelchair 150 feet activity     Assist      Assist Level: Total Assistance - Patient < 25% (per PT documentation of 35 feet supervision)   Blood pressure (!) 160/68, pulse 70, temperature 98.2 F (36.8 C), temperature source Oral, resp. rate 18, height '5\' 4"'$  (1.626 m), weight 75.8 kg, SpO2 96 %.  Medical Problem List and Plan: 1. Functional deficits secondary to impacted subcapital right hip fracture after fall.  Status post ORIF 02/02/2022.  Weightbearing as tolerated.             -patient may  shower- with Aquacel dressing in place             -ELOS/Goals: 10-14 days Supervision to mod I  Continue CIR- PT, OT   Team conference today to determine length of stay- first team conference 2.  Antithrombotics: -DVT/anticoagulation:  Pharmaceutical: Lovenox x6 weeks versus transition to Xarelto at discharge.  Check vascular study             -antiplatelet therapy: N/A 3. Pain Management: Hydrocodone as needed- encouraged pt to take for therapy.   5/28- pain controlled- con't regimen prn 4. Mood: Wellbutrin 300 mg daily, Adderall 30 mg twice daily, melatonin 3 mg nightly  5/25- pt asking to increase meds- will add Lexapro 5 mg daily instead of increasing wellbutrin since on Wellbutrin, Adderall and Rocephin, can lower seizure threshold. No SSRI's in allergy list.    5/26- went over changes with pt- also pt seeing neuropsych today- went over pt issues with them.   5/30- mood doing better- con't regimen             -antipsychotic agents: N/A 5. Neuropsych: This patient is capable of making decisions on her own behalf. 6. Skin/Wound Care: Routine skin checks 7. Fluids/Electrolytes/Nutrition: Routine in and out with follow-up chemistries 8.  Leukocytosis.   Empiric Rocephin.    5/25- U/A was large leuks and (+) nitrites- even though Urine Cx (-)- since had large leuks and nitrites, will con't Rocephin for 5 days total.   5/28- IV ABX done- can remove IV 9.  Hypertension.  Norvasc 10 mg daily, Cozaar 100 mg daily.  Monitor with increased mobility  5/28- BP al ittle elevated, but hurting this AM some- waiting on meds- con't to monitor trend  5/29 BP normotensive last check, continue to monitor trend  5/30-  BP 121 systolic this AM- usually controlled- con't to monitor for trend 10.  Hypothyroidism.  Synthroid 11.  Obesity.  BMI 28.23.  Dietary follow-up 12. R Rotator cuff tear- non surgical - pt didn't want surgery right now- pain control for now 13. Constipation- will give Sorbitol now for BM tonight hopefully.    5/25- give MOM- see if can get results- if none by tomorrow, will check KUB  5/26- will order Sorbitol 60cc after therapy- but also ordered KUB to make sure no obstruction/ileus, since had surgery and no BM since surgery.   5/27- KUB nonobstructive- will give lactulose 30G and follow with soap suds enema. 5/28- very large BM with lactulose  5/29 Reports she feels better after BM yesterda 5/30- LBM Saturday night- will give Sorbitol 45 cc after therapy and increased Senna to 2 tabs BID 14. Insomnia  5/27- will order Trazodone 25-50 mg QHS prn for sleep  5/29 Pt using PRN Trazodone for sleep, continue current regimen    I spent a total of  36   minutes on total care today- >50% coordination of care- due to team conference and d/w nursing about bowels     LOS: 7 days A FACE TO FACE EVALUATION WAS PERFORMED  [name] 02/11/2022, 8:13 AM

## 2022-02-11 NOTE — Patient Care Conference (Signed)
Inpatient RehabilitationTeam Conference and Plan of Care Update Date: 02/11/2022   Time: 11:09 AM    Patient Name: [name]      Medical Record Number: [id number]  Date of Birth: [birth year] Sex: Female         Room/Bed: 4W23C/4W23C-01 Payor Info: Payor: MEDICARE / Plan: MEDICARE PART A AND B / Product Type: *No Product type* /    Admit Date/Time:  02/04/2022 12:24 PM  Primary Diagnosis:  Closed subcapital fracture of neck of right femur [facility])  Hospital Problems: Principal Problem:   Closed subcapital fracture of neck of right femur [facility])    Expected Discharge Date: Expected Discharge Date: 02/19/22  Team Members Present: Physician leading conference: Dr. Courtney Heys Social Worker Present: Loralee Pacas, [place] Nurse Present: Dorthula Nettles, RN PT Present: Leavy Cella, PT OT Present: Roanna Epley, Nanci Pina, OT PPS Coordinator present : Gunnar Fusi, SLP     Current Status/Progress Goal Weekly Team Focus  Bowel/Bladder   Continent B/B with occassional incontinence LBM 5/27  Become fully continent  offer toileting assist PRN   Swallow/Nutrition/ Hydration             ADL's   bathing at shower level-min A; LB dressing with AE-mod A; UB bathing/dressing with supervision; functional transfers-CGA  supervision overall, min A LB dressing  activity tolerance, functional transers, BADL trasining, education, energy conservation   Mobility   minA bed mobility, minA to CGA sit to/from stand, CGA to close supervision gait up to 118f with RW, can be CGA to step up to 6in step with B rails - has initated education on stepping backwards with RW and CGA  Supervision overall  LE strengthening, endurance, gait, stair training, d/c planning   Communication             Safety/Cognition/ Behavioral Observations            Pain   Patient denies pain  <3 out of 10  Assess pain q shift and PRN   Skin   skin intact  skin to remain intact  Assess q shift and PRN      Discharge Planning:  pt will d/c to home with her husband to provide 24/7 care and PRN support from various family members.   Team Discussion: Still showing signs of depression, increased Lexapro, can't increase Wellbutrin. BP controlled. Ordered Sorbitol for constipation. Continent B/B, reports 3/10 pain, right thigh incision CDI. Has good family support at discharge.   Patient on target to meet rehab goals: yes, supervision goals. Currently mod assist lower body, using adaptive equipment. Shower with min assist, CGA transfers. Min assist to CGA with mobility. CGA 120 ft. with RW.  *See Care Plan and progress notes for long and short-term goals.   Revisions to Treatment Plan:  Adjusting medications.   Teaching Needs: Family education, medication/pain management, skin/wound care, transfer/gait training, etc.   Current Barriers to Discharge: Wound care and Weight bearing restrictions  Possible Resolutions to Barriers: Family education Follow-up therapy Order recommended DME     Medical Summary Current Status: continent- pain 3/10- controlled- R hip incision- BP 160s this AM- normally controlled  Barriers to Discharge: Home enviroment access/layout;Medical stability;Weight bearing restrictions;Weight;Wound care  Barriers to Discharge Comments: husband and children to help when goes home- no home barriers Possible Resolutions to Barriers/Weekly Focus: CGA to supervision usually- with ADLs- and min A with PT- pain still limiting and mood- increased lexapro and con't wellbutrin- d/c 6/7   Continued Need for Acute  Rehabilitation Level of Care: The patient requires daily medical management by a physician with specialized training in physical medicine and rehabilitation for the following reasons: Direction of a multidisciplinary physical rehabilitation program to maximize functional independence : Yes Medical management of patient stability for increased activity during participation  in an intensive rehabilitation regime.: Yes Analysis of laboratory values and/or radiology reports with any subsequent need for medication adjustment and/or medical intervention. : Yes   I attest that I was present, lead the team conference, and concur with the assessment and plan of the team.   Cristi Loron 02/11/2022, 4:02 PM

## 2022-02-11 NOTE — Progress Notes (Signed)
Patient ID: [name], female   DOB: 02-Aug-1941, 81 y.o.   MRN: [id number]  SW met with pt in room to provide update from team conference, and d/c date is 6/7. She is aware SW will follow-up with her husband to discuss above and schedule family edu.   SW left message for pt husband [name] to inform on above, and waiting on follow-up.   Loralee Pacas, MSW, [place] Office: [phone] Cell: [phone] Fax: [phone]

## 2022-02-12 LAB — BASIC METABOLIC PANEL
Anion gap: 9 (ref 5–15)
BUN: 8 mg/dL (ref 8–23)
CO2: 27 mmol/L (ref 22–32)
Calcium: 9.1 mg/dL (ref 8.9–10.3)
Chloride: 100 mmol/L (ref 98–111)
Creatinine, Ser: 0.96 mg/dL (ref 0.44–1.00)
GFR, Estimated: 59 mL/min — ABNORMAL LOW (ref >60)
Glucose, Bld: 101 mg/dL — ABNORMAL HIGH (ref 70–99)
Potassium: 3 mmol/L — ABNORMAL LOW (ref 3.5–5.1)
Sodium: 136 mmol/L (ref 135–145)

## 2022-02-12 LAB — CBC WITH DIFFERENTIAL/PLATELET
Abs Immature Granulocytes: 0.03 10*3/uL (ref 0.00–0.07)
Basophils Absolute: 0.1 10*3/uL (ref 0.0–0.1)
Basophils Relative: 1 %
Eosinophils Absolute: 0.5 10*3/uL (ref 0.0–0.5)
Eosinophils Relative: 5 %
HCT: 38.7 % (ref 36.0–46.0)
Hemoglobin: 12.3 g/dL (ref 12.0–15.0)
Immature Granulocytes: 0 %
Lymphocytes Relative: 25 %
Lymphs Abs: 2.3 10*3/uL (ref 0.7–4.0)
MCH: 27.5 pg (ref 26.0–34.0)
MCHC: 31.8 g/dL (ref 30.0–36.0)
MCV: 86.4 fL (ref 80.0–100.0)
Monocytes Absolute: 0.9 10*3/uL (ref 0.1–1.0)
Monocytes Relative: 10 %
Neutro Abs: 5.5 10*3/uL (ref 1.7–7.7)
Neutrophils Relative %: 59 %
Platelets: 421 10*3/uL — ABNORMAL HIGH (ref 150–400)
RBC: 4.48 MIL/uL (ref 3.87–5.11)
RDW: 15.7 % — ABNORMAL HIGH (ref 11.5–15.5)
WBC: 9.3 10*3/uL (ref 4.0–10.5)
nRBC: 0 % (ref 0.0–0.2)

## 2022-02-12 MED ORDER — MEGESTROL ACETATE 400 MG/10ML PO SUSP
800.0000 mg | Freq: Every day | ORAL | Status: DC
  Administered 2022-02-12 – 2022-02-19 (×8): 800 mg via ORAL
  Filled 2022-02-12 (×9): qty 20

## 2022-02-12 MED ORDER — POTASSIUM CHLORIDE CRYS ER 20 MEQ PO TBCR
40.0000 meq | EXTENDED_RELEASE_TABLET | Freq: Two times a day (BID) | ORAL | Status: AC
  Administered 2022-02-12 – 2022-02-13 (×3): 40 meq via ORAL
  Filled 2022-02-12 (×3): qty 2

## 2022-02-12 NOTE — Plan of Care (Signed)
  Problem: Consults Goal: RH GENERAL PATIENT EDUCATION Description: See Patient Education module for education specifics. 02/12/2022 1826 by Ardelia Mems, RN Outcome: Progressing 02/12/2022 1826 by Ardelia Mems, RN Outcome: Progressing 02/12/2022 1826 by Ardelia Mems, RN Outcome: Progressing Goal: Skin Care Protocol Initiated - if Braden Score 18 or less Description: If consults are not indicated, leave blank or document N/A 02/12/2022 1826 by Ardelia Mems, RN Outcome: Progressing 02/12/2022 1826 by Ardelia Mems, RN Outcome: Progressing 02/12/2022 1826 by Ardelia Mems, RN Outcome: Progressing   Problem: RH BOWEL ELIMINATION Goal: RH STG MANAGE BOWEL WITH ASSISTANCE Description: STG Manage Bowel with Supervision Assistance. 02/12/2022 1826 by Ardelia Mems, RN Outcome: Progressing 02/12/2022 1826 by Ardelia Mems, RN Outcome: Progressing 02/12/2022 1826 by Ardelia Mems, RN Outcome: Progressing Goal: RH STG MANAGE BOWEL W/MEDICATION W/ASSISTANCE Description: STG Manage Bowel with Medication with Supervision Assistance. 02/12/2022 1826 by Ardelia Mems, RN Outcome: Progressing 02/12/2022 1826 by Ardelia Mems, RN Outcome: Progressing 02/12/2022 1826 by Ardelia Mems, RN Outcome: Progressing   Problem: RH BLADDER ELIMINATION Goal: RH STG MANAGE BLADDER WITH ASSISTANCE Description: STG Manage Bladder With Supervision Assistance 02/12/2022 1826 by Ardelia Mems, RN Outcome: Progressing 02/12/2022 1826 by Ardelia Mems, RN Outcome: Progressing 02/12/2022 1826 by Ardelia Mems, RN Outcome: Progressing Goal: RH STG MANAGE BLADDER WITH MEDICATION WITH ASSISTANCE Description: STG Manage Bladder With Medication With Supervision Assistance. 02/12/2022 1826 by Ardelia Mems, RN Outcome: Progressing 02/12/2022 1826 by Ardelia Mems, RN Outcome: Progressing 02/12/2022 1826 by Ardelia Mems, RN Outcome: Progressing   Problem: RH SKIN INTEGRITY Goal: RH STG MAINTAIN SKIN INTEGRITY WITH ASSISTANCE Description: STG Maintain Skin Integrity With Supervision Assistance. 02/12/2022 1826 by Ardelia Mems, RN Outcome: Progressing 02/12/2022 1826 by Ardelia Mems, RN Outcome: Progressing 02/12/2022 1826 by Ardelia Mems, RN Outcome: Progressing Goal: RH STG ABLE TO PERFORM INCISION/WOUND CARE W/ASSISTANCE Description: STG Able To Perform Incision/Wound Care With Supervision Assistance. 02/12/2022 1826 by Ardelia Mems, RN Outcome: Progressing 02/12/2022 1826 by Ardelia Mems, RN Outcome: Progressing 02/12/2022 1826 by Ardelia Mems, RN Outcome: Progressing   Problem: RH SAFETY Goal: RH STG ADHERE TO SAFETY PRECAUTIONS W/ASSISTANCE/DEVICE Description: STG Adhere to Safety Precautions With Cues and Reminders. 02/12/2022 1826 by Ardelia Mems, RN Outcome: Progressing 02/12/2022 1826 by Ardelia Mems, RN Outcome: Progressing 02/12/2022 1826 by Ardelia Mems, RN Outcome: Progressing Goal: RH STG DECREASED RISK OF FALL WITH ASSISTANCE Description: STG Decreased Risk of Fall With Supervision Assistance. 02/12/2022 1826 by Ardelia Mems, RN Outcome: Progressing 02/12/2022 1826 by Ardelia Mems, RN Outcome: Progressing 02/12/2022 1826 by Ardelia Mems, RN Outcome: Progressing   Problem: RH PAIN MANAGEMENT Goal: RH STG PAIN MANAGED AT OR BELOW PT'S PAIN GOAL Description: < 3 on a 0-10 pain scale. 02/12/2022 1826 by Ardelia Mems, RN Outcome: Progressing 02/12/2022 1826 by Ardelia Mems, RN Outcome: Progressing 02/12/2022 1826 by Ardelia Mems, RN Outcome: Progressing   Problem: RH KNOWLEDGE DEFICIT GENERAL Goal: RH STG INCREASE KNOWLEDGE OF SELF CARE AFTER HOSPITALIZATION Description: Patient will demonstrate knowledge of self-care management, medication/pain management, skin/wound care,  weight bearing precautions with educational materials and handouts provided by staff independently at discharge. 02/12/2022 1826 by Ardelia Mems, RN Outcome: Progressing 02/12/2022 1826 by Ardelia Mems, RN Outcome: Progressing 02/12/2022 1826 by Ardelia Mems, RN Outcome: Progressing

## 2022-02-12 NOTE — Progress Notes (Signed)
Physical Therapy Weekly Progress Note  Patient Details  Name: [name] MRN: [id number] Date of Birth: 08/13/1941  Beginning of progress report period: Feb 05, 2022 End of progress report period: Feb 12, 2022  Today's Date: 02/13/2022  Patient has met 4 of 4 short term goals.  Pt is making steady gains towards goals. Pt requires minA for bed mobility with bed features and modA from flat bed. Pt is CGA for Sit to stand and CGA overall for transfers and ambulation >60f. Pt requires reinforcement of weight bearing through RLE as pt has increased use of walking on forefoot vs active heel strike. Education has been ongoing to increase weight bearing for optimal gait mechanics. Pt has initiated stair training and is CGA with B rails and CGA stepping backwards with RW without rail. Family ed will occur Friday 6/2 with husband.   Patient continues to demonstrate the following deficits muscle weakness and muscle joint tightness and therefore will continue to benefit from skilled PT intervention to increase functional independence with mobility.  Patient progressing toward long term goals..  Continue plan of care.  PT Short Term Goals Week 1:  PT Short Term Goal 1 (Week 1): Pt will complete bed mobility with minA PT Short Term Goal 1 - Progress (Week 1): Met PT Short Term Goal 2 (Week 1): Pt will complete bed<>chair transfers with minA and LRAD PT Short Term Goal 2 - Progress (Week 1): Met PT Short Term Goal 3 (Week 1): Pt will ambulate 542fwith minA and LRAD PT Short Term Goal 3 - Progress (Week 1): Met PT Short Term Goal 4 (Week 1): Pt will initiate stair training PT Short Term Goal 4 - Progress (Week 1): Met Week 2:  PT Short Term Goal 1 (Week 2): =LTG due to ELOS  Skilled Therapeutic Interventions/Progress Updates:  Ambulation/gait training;Discharge planning;Functional mobility training;Psychosocial support;Therapeutic Activities;Therapeutic Exercise;Skin care/wound  management;Neuromuscular re-education;Disease management/prevention;Balance/vestibular training;Cognitive remediation/compensation;DME/adaptive equipment instruction;Pain management;UE/LE Strength taining/ROM;UE/LE Coordination activities;Stair training;Patient/family education;Community reintegration   Therapy Documentation Precautions:  Precautions Precautions: Fall Precaution Comments: HOH Restrictions Weight Bearing Restrictions: No RLE Weight Bearing: Weight bearing as tolerated    Therapy/Group: Individual Therapy   TaExcell SeltzerPT, DPT, CSRS 02/13/2022, 3:53 PM

## 2022-02-12 NOTE — Progress Notes (Signed)
PROGRESS NOTE   Subjective/Complaints:  Pt reports asking for her "happy pill" - still having depression issues.  Last pain meds last night- but needs this AM for therapy.  Doesn't have appetite- not eating much, but breakfast the worst-  Ate ~ 25-30% of tray this AM.   ROS:  Pt denies SOB, abd pain, CP, N/V/C/D, and vision changes  Objective:   No results found.  Recent Labs    02/12/22 0656  WBC 9.3  HGB 12.3  HCT 38.7  PLT 421*    Recent Labs    02/12/22 0656  NA 136  K 3.0*  CL 100  CO2 27  GLUCOSE 101*  BUN 8  CREATININE 0.96  CALCIUM 9.1     Intake/Output Summary (Last 24 hours) at 02/12/2022 0806 Last data filed at 02/11/2022 1755 Gross per 24 hour  Intake 380 ml  Output --  Net 380 ml        Physical Exam: Vital Signs Blood pressure 139/77, pulse 76, temperature 98.3 F (36.8 C), temperature source Oral, resp. rate 18, height '5\' 4"'$  (1.626 m), weight 75.8 kg, SpO2 98 %.         General: awake, alert, appropriate, sitting up in bed picking at breakfast- ate 25-30%; NAD HENT: conjugate gaze; oropharynx moist CV: regular rate; no JVD Pulmonary: CTA B/L; no W/R/R- good air movement GI: soft, NT, ND, (+)BS Psychiatric: appropriate- still sad/depressed affect Neurological: Ox3 Musculoskeletal:     Cervical back: Neck supple. No tenderness.     Comments: LUE 5/5 RUE- deltoid 2-/5- can only lift arm to 20-30 degrees abduction of flexion Otherwise 5-/5 in RUE- cannot participate in empty can test- obvious RTC tear- no change LLE- 5/5 RLE- HF 2-/5; otherwise 5/5 in RLE  Skin: R hip has dressing in place- mod edema, no erythema; looks stable- less edema/swelling  Neurological:     General: No focal deficit present.     Mental Status: She is alert and oriented to person, place, and time.     Comments: Patient is alert.  No acute distress.   Assessment/Plan: 1. Functional deficits  which require 3+ hours per day of interdisciplinary therapy in a comprehensive inpatient rehab setting. Physiatrist is providing close team supervision and 24 hour management of active medical problems listed below. Physiatrist and rehab team continue to assess barriers to discharge/monitor patient progress toward functional and medical goals  Care Tool:  Bathing    Body parts bathed by patient: Right arm, Left arm, Chest, Abdomen, Front perineal area, Buttocks, Right upper leg, Left upper leg, Face   Body parts bathed by helper: Right lower leg, Left lower leg     Bathing assist Assist Level: Minimal Assistance - Patient > 75%     Upper Body Dressing/Undressing Upper body dressing   What is the patient wearing?: Pull over shirt    Upper body assist Assist Level: Set up assist    Lower Body Dressing/Undressing Lower body dressing      What is the patient wearing?: Pants     Lower body assist Assist for lower body dressing: Minimal Assistance - Patient > 75%     Toileting Toileting  Toileting assist Assist for toileting: Minimal Assistance - Patient > 75%     Transfers Chair/bed transfer  Transfers assist  Chair/bed transfer activity did not occur: Safety/medical concerns  Chair/bed transfer assist level: Minimal Assistance - Patient > 75%     Locomotion Ambulation   Ambulation assist      Assist level: Contact Guard/Touching assist Assistive device: Walker-rolling Max distance: 20   Walk 10 feet activity   Assist  Walk 10 feet activity did not occur: Safety/medical concerns  Assist level: Contact Guard/Touching assist Assistive device: Walker-rolling   Walk 50 feet activity   Assist Walk 50 feet with 2 turns activity did not occur: Safety/medical concerns         Walk 150 feet activity   Assist Walk 150 feet activity did not occur: Safety/medical concerns         Walk 10 feet on uneven surface  activity   Assist Walk 10 feet  on uneven surfaces activity did not occur: Safety/medical concerns         Wheelchair     Assist Is the patient using a wheelchair?: Yes Type of Wheelchair: Manual    Wheelchair assist level: Supervision/Verbal cueing Max wheelchair distance: 51f    Wheelchair 50 feet with 2 turns activity    Assist        Assist Level: Moderate Assistance - Patient 50 - 74% (per PT documentation of 35 feet supervision)   Wheelchair 150 feet activity     Assist      Assist Level: Total Assistance - Patient < 25% (per PT documentation of 35 feet supervision)   Blood pressure 139/77, pulse 76, temperature 98.3 F (36.8 C), temperature source Oral, resp. rate 18, height '5\' 4"'$  (1.626 m), weight 75.8 kg, SpO2 98 %.  Medical Problem List and Plan: 1. Functional deficits secondary to impacted subcapital right hip fracture after fall.  Status post ORIF 02/02/2022.  Weightbearing as tolerated.             -patient may  shower- with Aquacel dressing in place             -ELOS/Goals: 10-14 days Supervision to mod I  Continue CIR- PT, OT   D/c 6/7  Con't CIR- PT and OT 2.  Antithrombotics: -DVT/anticoagulation:  Pharmaceutical: Lovenox x6 weeks versus transition to Xarelto at discharge.  Check vascular study             -antiplatelet therapy: N/A 3. Pain Management: Hydrocodone as needed- encouraged pt to take for therapy.   5/31- taking fewer pain meds- con't regimen 4. Mood: Wellbutrin 300 mg daily, Adderall 30 mg twice daily, melatonin 3 mg nightly  5/25- pt asking to increase meds- will add Lexapro 5 mg daily instead of increasing wellbutrin since on Wellbutrin, Adderall and Rocephin, can lower seizure threshold. No SSRI's in allergy list.    5/26- went over changes with pt- also pt seeing neuropsych today- went over pt issues with them.   5/31- increased laxapro to 10 mg daily per d/w team- still depressed.              -antipsychotic agents: N/A 5. Neuropsych: This patient is  capable of making decisions on her own behalf. 6. Skin/Wound Care: Routine skin checks 7. Fluids/Electrolytes/Nutrition: Routine in and out with follow-up chemistries 8.  Leukocytosis.  Empiric Rocephin.    5/25- U/A was large leuks and (+) nitrites- even though Urine Cx (-)- since had large leuks and nitrites, will con't Rocephin  for 5 days total.   5/28- IV ABX done- can remove IV 9.  Hypertension.  Norvasc 10 mg daily, Cozaar 100 mg daily.  Monitor with increased mobility  5/31- BP controlled- con't regimen 10.  Hypothyroidism.  Synthroid 11.  Obesity.  BMI 28.23.  Dietary follow-up 12. R Rotator cuff tear- non surgical - pt didn't want surgery right now- pain control for now 13. Constipation- will give Sorbitol now for BM tonight hopefully.    5/25- give MOM- see if can get results- if none by tomorrow, will check KUB  5/26- will order Sorbitol 60cc after therapy- but also ordered KUB to make sure no obstruction/ileus, since had surgery and no BM since surgery.   5/27- KUB nonobstructive- will give lactulose 30G and follow with soap suds enema. 5/28- very large BM with lactulose  5/29 Reports she feels better after BM yesterda 5/30- LBM Saturday night- will give Sorbitol 45 cc after therapy and increased Senna to 2 tabs BID 5/31- Had large BM yesterday- was incontinent due to Sorbitol 14. Insomnia  5/27- will order Trazodone 25-50 mg QHS prn for sleep  5/29 Pt using PRN Trazodone for sleep, continue current regimen 15. Hypokalemia  5/31- K+ 3.0- will give 40 MEq x3 and recheck Thursday  16. Poor appetite- will start Megace to help- likely due to depressed mood.    LOS: 8 days A FACE TO FACE EVALUATION WAS PERFORMED  [name] 02/12/2022, 8:06 AM

## 2022-02-12 NOTE — Progress Notes (Signed)
Occupational Therapy Session Note  Patient Details  Name: [name] MRN: [id number] Date of Birth: Mar 28, 1941  Today's Date: 02/12/2022 OT Individual Time: 1100-1140 OT Individual Time Calculation (min): 40 min    Short Term Goals: Week 2:  OT Short Term Goal 1 (Week 2): STG=LTG 2/2 ELOS (continue wotking towards overall supervision LTGs)  Skilled Therapeutic Interventions/Progress Updates:   Care coordination with Herminio Heads prior to session for progress updates for weekly note. Pt in w/c seated and open to all activities presented this am. No hindrances with pain this session except with standing and R UE reach to past history of RTC issues. OT applied simple light ktape to rotator cuff region from middle deltoid insertion to allow for greater joint stability for ADL/IADL task performance with + results and report. Pt stood for 3  sets of 2 minutes with CGA with RW support while using reacher in L UE to retrieve light items from floor. Required multiple trials due to novel attempt and fear of LOB. Sister arrived and pt was reporting her progress and d/c date for next Wed. OT recommending LH sponge, LH shoe horn and reacher for home use and pt reports she does have a reacher at home. Pt then performed light 1 lb weight there ex in seated and standing intervals only for bicep curls x 10 reps x 2 sets. Pt requires frequent rests between activity due to diminished activity tolerance. OT educated in overall joint protection for UE/LE for ADL's and IADL's. Palliative RN arrived and OT handed pt off for discussion with chair alarm set and call bell and needs in place.   Therapy/Group: Individual Therapy  Barnabas Lister 02/12/2022, 12:15 PM

## 2022-02-12 NOTE — Progress Notes (Addendum)
Occupational Therapy Weekly Progress Note  Patient Details  Name: [name] MRN: [id number] Date of Birth: Feb 24, 1941  Beginning of progress report period: Feb 05, 2022 End of progress report period: Feb 12, 2022  Patient has met 3 of 3 short term goals.  Pt is making steady progress with BADLs and functional transfers. Sit<>stand and functional transfers with CGA using RW. Pt completes bathing tasks with min A at shower level. Pt requires mod A for LB dressing tasks and completes UB dressing tasks with supervision.   Patient continues to demonstrate the following deficits: muscle weakness, balance and activity tolerance deficits and therefore will continue to benefit from skilled OT intervention to enhance overall performance with BADL, iADL, and Reduce care partner burden.  Patient progressing toward long term goals..  Continue plan of care.  OT Short Term Goals Week 1:  OT Short Term Goal 1 (Week 1): Pt will complete sit<>stand transfer with CGA OT Short Term Goal 1 - Progress (Week 1): Met OT Short Term Goal 2 (Week 1): Pt will complete stand pivot w/c<>BSC with CGA OT Short Term Goal 2 - Progress (Week 1): Met OT Short Term Goal 3 (Week 1): Pt will donn/doff pants with mod assist using AE as needed OT Short Term Goal 3 - Progress (Week 1): Met Week 2:  OT Short Term Goal 1 (Week 2): STG=LTG 2/2 ELOS (continue wotking towards overall supervision LTGs)   Leroy Libman 02/12/2022, 6:47 AM

## 2022-02-12 NOTE — Progress Notes (Signed)
Physical Therapy Session Note  Patient Details  Name: [name] MRN: [id number] Date of Birth: July 19, 1941  Today's Date: 02/12/2022 PT Individual Time: 1000-1100 and 1420-1528 PT Individual Time Calculation (min): 60 min and 68 min  Short Term Goals: Week 1:  PT Short Term Goal 1 (Week 1): Pt will complete bed mobility with minA PT Short Term Goal 2 (Week 1): Pt will complete bed<>chair transfers with minA and LRAD PT Short Term Goal 3 (Week 1): Pt will ambulate 17f with minA and LRAD PT Short Term Goal 4 (Week 1): Pt will initiate stair training  Skilled Therapeutic Interventions/Progress Updates: Tx1: Pt presented in bed agreeable to therapy. Pt states increased pain after OT session this am, decreased with rest and currently 2/10. Initiated session with supine therex WLP. Pt particiapted in ankle pumps, QS, GS x 20 bilaterally. Pt also participated in AA heel slides (with use of gait belt), AA abd/add, 2 x 10, AA SLR, 2 x 5 due to increased RLE pain at hip and knee. Pt also participated in SAQ and PTA performed manual hamstring stretch 30 sec x 3 and L hip ER "fall outs" 2 x 10. PTA provided education on location of hardware (used imaging) and expectation of pain/discomfort with mobility barring sharp/shooting pain. Remaining session focused on sequencing of bed mobility to simulate home environment. Pt was unable to completely turn to R due to pain in R hip and due to R RTC pt was unable to use RUE to power up on L side. With modA and increased time pt was able to prop self up into long sit and rotate to R to get to EOB. Pt then performed stand pivot to w/c with CGA overall. Pt left in w/c at end of session with belt alarm on, call bell within reach and needs met.   Tx2: Pt presented in w/c agreeable to therapy. Pt states decreased pain from this am 1/10 at rest, did increase with mobility but pt did not rate and rest breaks provided as needed. Pt transported to day room for time  management and performed ambulatory transfer to NuStep. Participated in NuStep L2 x 6 min for increased ROM tolerance of RLE and general conditioning. Pt able to maintain average 40-50 SPM with activity. Pt then performed x 3 min with BLE only with emphasis on pushing through R heel vs forefoot. Pt returned to w/c in same manner as prior and participated in toe taps to 4in step x 10 bilaterally for forced wt bearing through RLE. Pt questioned on increase in pain with activity with pt stating increase to 3-4/10. Pt inquiring about OPPT vs HHPT, Advised that currently recommending HHPT however OPPT may be an option. Discussed that pt needs to be proficient in bed mobility and step to enter/exit house regularly. Pt then participated in shorter  bouts of ambulation ~40-50 ft with emphasis on improving heel strike and weight bearing as pt noted to transfer with heel off ground. Pt noted to have improved antalgic gait then when ambulated with this therapist previously with pt indicating some increase in pain (4/10) but resolved with rest. Discussed with pt strategy of taking PRN pain meds prior to therapy session to improve weight bearing tolerance with pt amicable to idea. PTA reinforced that pt needs to tell nurse and offered to place signage to help with pt appreciative of effort. Pt then transported back towards room and pt ambulating remaining ~578fto room/bed. Pt was able to perform sit to supine with  supervision and bed flat. Pt repositioned to comfort and left with bed alarm on, call bell within reach and needs met.      Therapy Documentation Precautions:  Precautions Precautions: Fall Precaution Comments: HOH Restrictions Weight Bearing Restrictions: No RLE Weight Bearing: Weight bearing as tolerated General:   Vital Signs:   Pain:   Mobility:   Locomotion :    Trunk/Postural Assessment :    Balance:   Exercises:   Other Treatments:      Therapy/Group: Individual Therapy  Aleeta Schmaltz 02/12/2022, 12:41 PM

## 2022-02-12 NOTE — Progress Notes (Addendum)
Occupational Therapy Session Note  Patient Details  Name: [name] MRN: [id number] Date of Birth: 1941/07/30  Today's Date: 02/12/2022 OT Individual Time: 2683-4196 OT Individual Time Calculation (min): 70 min    Short Term Goals: Week 2:  OT Short Term Goal 1 (Week 2): STG=LTG 2/2 ELOS (continue wotking towards overall supervision LTGs)  Skilled Therapeutic Interventions/Progress Updates:    Pt resting in bed upon arrival. Pt stated, jokingly, that she would "just like to stay in bed." OT intervention with focus on bed mobility, sit<>stand, standing balance, funcitonal amb with RW, toileting, bathing/dressing with sit<>stand from w/c at sink. Min A for supine>sit EOB with HOB elevated. Sit>stand from EOB with CGA. CGA for amb with RW to bathroom. CGa for toileting. Pt completed bathing/dressing tasks with sit<>stand from w/c at sink-CGA for standing balance and assistance with donning socks. Pt transitioned to day room and engaged in standing activity folding wash cloths at table. Pt required verbal cue to shift weight onto RLE. Pt reports increased pain in RLE with weightbearing. Pt also states she hasn't received pain meds yet. Pt returned to room and remained in w/c with belt alarm activated. All needs within reach.   Therapy Documentation Precautions:  Precautions Precautions: Fall Precaution Comments: HOH Restrictions Weight Bearing Restrictions: No RLE Weight Bearing: Weight bearing as tolerated   Pain:  Pt reports 3/10 RLE pain with weightbearing; 0/10 at rest; has not received meds per pt report; repositioned  Therapy/Group: Individual Therapy  Leroy Libman 02/12/2022, 9:32 AM

## 2022-02-12 NOTE — Progress Notes (Signed)
Patient ID: [name], female   DOB: 01/12/1941, 81 y.o.   MRN: [id number]  SW made contact with pt husband to provide updates from team conference, and d/c date 6/7. Family edu scheduled for Friday (6/2) 1pm until completed. Confirms she will have 24/7 care at discharge.   Loralee Pacas, MSW, [place] Office: [phone] Cell: [phone] Fax: [phone]

## 2022-02-13 LAB — BASIC METABOLIC PANEL
Anion gap: 7 (ref 5–15)
BUN: 10 mg/dL (ref 8–23)
CO2: 25 mmol/L (ref 22–32)
Calcium: 9.4 mg/dL (ref 8.9–10.3)
Chloride: 107 mmol/L (ref 98–111)
Creatinine, Ser: 1.01 mg/dL — ABNORMAL HIGH (ref 0.44–1.00)
GFR, Estimated: 56 mL/min — ABNORMAL LOW (ref >60)
Glucose, Bld: 93 mg/dL (ref 70–99)
Potassium: 3.3 mmol/L — ABNORMAL LOW (ref 3.5–5.1)
Sodium: 139 mmol/L (ref 135–145)

## 2022-02-13 MED ORDER — POTASSIUM CHLORIDE CRYS ER 20 MEQ PO TBCR
40.0000 meq | EXTENDED_RELEASE_TABLET | Freq: Once | ORAL | Status: AC
Start: 2022-02-13 — End: 2022-02-13
  Administered 2022-02-13: 40 meq via ORAL
  Filled 2022-02-13: qty 2

## 2022-02-13 NOTE — Progress Notes (Signed)
Physical Therapy Session Note  Patient Details  Name: [name] MRN: [id number] Date of Birth: 01-05-1941  Today's Date: 02/13/2022 PT Individual Time: 1300-1415 PT Individual Time Calculation (min): 75 min   Short Term Goals: Week 2:  PT Short Term Goal 1 (Week 2): =LTG due to ELOS  Skilled Therapeutic Interventions/Progress Updates:  Pt received seated in bed, agreeable to PT session. No complaints of pain at rest, does have onset of R hip pain with mobility. Pt requesting pain medication at end of session, nursing notified. Supine to sit with min A needed for RLE management. Assisted pt with donning shoes while seated EOB. Sit to stand with min A to RW, stand pivot transfer to w/c with RW and min A. Pt exhibits fair tolerance for WB on RLE in stance, needs cues to keep heel down in standing. Pt reports urge to urinate. Ambulatory transfer into bathroom with RW and min A for balance. Pt is able to complete 3/3 toileting steps with CGA. Demonstrated how to safely ascend/descend curb step backwards with RW. Pt able to perform return demonstration x 4 reps with RW and CGA for balance. Pt unable to ascend step forwards due to pain in R hip in stance. Ambulation 2 x 90 ft with RW and CGA for balance, cues for R heel strike, upright head and trunk during gait. Pt exhibits antalgic gait pattern with decreased tolerance for stance on RLE. Pt reports BUE fatigue following gait training due to heavy UE reliance on RW. Nustep level 1 x 5 min with use of BLE only for increasing WB tolerance and ROM, one seated rest break. Pt requests to return to bed at end of session, min A needed for RLE management. Pt left seated in bed with needs in reach, bed alarm in place.   Therapy Documentation Precautions:  Precautions Precautions: Fall Precaution Comments: HOH Restrictions Weight Bearing Restrictions: No RLE Weight Bearing: Weight bearing as tolerated       Therapy/Group: Individual  Therapy   Excell Seltzer, PT, DPT, CSRS 02/13/2022, 3:55 PM

## 2022-02-13 NOTE — Progress Notes (Signed)
PROGRESS NOTE   Subjective/Complaints:  Pt reports ate oatmeal this Am and bites off pancakes  No other concerns.  Wants pain meds before therapy.    ROS:  Pt denies SOB, abd pain, CP, N/V/C/D, and vision changes  Objective:   No results found.  Recent Labs    02/12/22 0656  WBC 9.3  HGB 12.3  HCT 38.7  PLT 421*    Recent Labs    02/12/22 0656 02/13/22 0537  NA 136 139  K 3.0* 3.3*  CL 100 107  CO2 27 25  GLUCOSE 101* 93  BUN 8 10  CREATININE 0.96 1.01*  CALCIUM 9.1 9.4     Intake/Output Summary (Last 24 hours) at 02/13/2022 0834 Last data filed at 02/12/2022 1900 Gross per 24 hour  Intake 440 ml  Output --  Net 440 ml        Physical Exam: Vital Signs Blood pressure (!) 141/57, pulse 71, temperature 98.4 F (36.9 C), temperature source Oral, resp. rate 17, height '5\' 4"'$  (1.626 m), weight 75.8 kg, SpO2 93 %.          General: awake, alert, appropriate, sitting u in bed; ate 30% of so of tray; NAD HENT: conjugate gaze; oropharynx moist CV: regular rate; no JVD Pulmonary: CTA B/L; no W/R/R- good air movement GI: soft, NT, ND, (+)BS Psychiatric: appropriate but still depressed affect Neurological: Ox3  Musculoskeletal:     Cervical back: Neck supple. No tenderness.     Comments: LUE 5/5 RUE- deltoid 2-/5- can only lift arm to 20-30 degrees abduction of flexion Otherwise 5-/5 in RUE- cannot participate in empty can test- obvious RTC tear- no change LLE- 5/5 RLE- HF 2-/5; otherwise 5/5 in RLE  Skin: R hip has dressing in place- mod edema, no erythema; looks stable- less edema/swelling- looks good  Neurological:     General: No focal deficit present.     Mental Status: She is alert and oriented to person, place, and time.     Comments: Patient is alert.  No acute distress.   Assessment/Plan: 1. Functional deficits which require 3+ hours per day of interdisciplinary therapy in a  comprehensive inpatient rehab setting. Physiatrist is providing close team supervision and 24 hour management of active medical problems listed below. Physiatrist and rehab team continue to assess barriers to discharge/monitor patient progress toward functional and medical goals  Care Tool:  Bathing    Body parts bathed by patient: Right arm, Left arm, Chest, Abdomen, Front perineal area, Buttocks, Right upper leg, Left upper leg, Face   Body parts bathed by helper: Right lower leg, Left lower leg     Bathing assist Assist Level: Minimal Assistance - Patient > 75%     Upper Body Dressing/Undressing Upper body dressing   What is the patient wearing?: Pull over shirt    Upper body assist Assist Level: Set up assist    Lower Body Dressing/Undressing Lower body dressing      What is the patient wearing?: Pants     Lower body assist Assist for lower body dressing: Contact Guard/Touching assist     Toileting Toileting    Toileting assist Assist for toileting: Contact  Guard/Touching assist     Transfers Chair/bed transfer  Transfers assist  Chair/bed transfer activity did not occur: Safety/medical concerns  Chair/bed transfer assist level: Minimal Assistance - Patient > 75%     Locomotion Ambulation   Ambulation assist      Assist level: Contact Guard/Touching assist Assistive device: Walker-rolling Max distance: 20   Walk 10 feet activity   Assist  Walk 10 feet activity did not occur: Safety/medical concerns  Assist level: Contact Guard/Touching assist Assistive device: Walker-rolling   Walk 50 feet activity   Assist Walk 50 feet with 2 turns activity did not occur: Safety/medical concerns         Walk 150 feet activity   Assist Walk 150 feet activity did not occur: Safety/medical concerns         Walk 10 feet on uneven surface  activity   Assist Walk 10 feet on uneven surfaces activity did not occur: Safety/medical concerns          Wheelchair     Assist Is the patient using a wheelchair?: Yes Type of Wheelchair: Manual    Wheelchair assist level: Supervision/Verbal cueing Max wheelchair distance: 15f    Wheelchair 50 feet with 2 turns activity    Assist        Assist Level: Moderate Assistance - Patient 50 - 74% (per PT documentation of 35 feet supervision)   Wheelchair 150 feet activity     Assist      Assist Level: Total Assistance - Patient < 25% (per PT documentation of 35 feet supervision)   Blood pressure (!) 141/57, pulse 71, temperature 98.4 F (36.9 C), temperature source Oral, resp. rate 17, height '5\' 4"'$  (1.626 m), weight 75.8 kg, SpO2 93 %.  Medical Problem List and Plan: 1. Functional deficits secondary to impacted subcapital right hip fracture after fall.  Status post ORIF 02/02/2022.  Weightbearing as tolerated.             -patient may  shower- with Aquacel dressing in place             -ELOS/Goals: 10-14 days Supervision to mod I  D/c 6/7  Con't CIR- PT and OT-  2.  Antithrombotics: -DVT/anticoagulation:  Pharmaceutical: Lovenox x6 weeks versus transition to Xarelto at discharge.  Check vascular study             -antiplatelet therapy: N/A 3. Pain Management: Hydrocodone as needed- encouraged pt to take for therapy.   6/1- taking ~ 1x/day Norco- con't regimen-  4. Mood: Wellbutrin 300 mg daily, Adderall 30 mg twice daily, melatonin 3 mg nightly  5/25- pt asking to increase meds- will add Lexapro 5 mg daily instead of increasing wellbutrin since on Wellbutrin, Adderall and Rocephin, can lower seizure threshold. No SSRI's in allergy list.    5/26- went over changes with pt- also pt seeing neuropsych today- went over pt issues with them.   5/31- increased lexapro to 10 mg daily per d/w team- still depressed.              -antipsychotic agents: N/A 5. Neuropsych: This patient is capable of making decisions on her own behalf. 6. Skin/Wound Care: Routine skin checks 7.  Fluids/Electrolytes/Nutrition: Routine in and out with follow-up chemistries 8.  Leukocytosis.  Empiric Rocephin.    5/25- U/A was large leuks and (+) nitrites- even though Urine Cx (-)- since had large leuks and nitrites, will con't Rocephin for 5 days total.   5/28- IV ABX done- can remove  IV 9.  Hypertension.  Norvasc 10 mg daily, Cozaar 100 mg daily.  Monitor with increased mobility  6/1- BP very slightly elevated- monitor for trend.  10.  Hypothyroidism.  Synthroid 11.  Obesity.  BMI 28.23.  Dietary follow-up 12. R Rotator cuff tear- non surgical - pt didn't want surgery right now- pain control for now 13. Constipation- will give Sorbitol now for BM tonight hopefully.    5/25- give MOM- see if can get results- if none by tomorrow, will check KUB  5/26- will order Sorbitol 60cc after therapy- but also ordered KUB to make sure no obstruction/ileus, since had surgery and no BM since surgery.   5/27- KUB nonobstructive- will give lactulose 30G and follow with soap suds enema. 5/28- very large BM with lactulose  5/29 Reports she feels better after BM yesterda 5/30- LBM Saturday night- will give Sorbitol 45 cc after therapy and increased Senna to 2 tabs BID 5/31- Had large BM yesterday- was incontinent due to Sorbitol  6/1- LBM 2 days ago- doesn't feel constipated-wait til tomorrow until intervening.  14. Insomnia  5/27- will order Trazodone 25-50 mg QHS prn for sleep  5/29 Pt using PRN Trazodone for sleep, continue current regimen 15. Hypokalemia  5/31- K+ 3.0- will give 40 MEq x3 and recheck Thursday  6/1- K+ up to 3.3 with 2 doses- will give 1 more dose and already had last dose scheduled/given this Am after labs- K+ up to 3.3- will recheck in AM  16. Poor appetite- will start Megace to help- likely due to depressed mood.     LOS: 9 days A FACE TO FACE EVALUATION WAS PERFORMED  Shayda Kalka 02/13/2022, 8:34 AM

## 2022-02-13 NOTE — Progress Notes (Signed)
Occupational Therapy Session Note  Patient Details  Name: KAILI CASTILLE MRN: [id number] Date of Birth: 1941-03-15  Today's Date: 02/13/2022 OT Individual Time: 7622-6333 OT Individual Time Calculation (min): 72 min    Short Term Goals: Week 2:  OT Short Term Goal 1 (Week 2): STG=LTG 2/2 ELOS (continue wotking towards overall supervision LTGs)  Skilled Therapeutic Interventions/Progress Updates:    Pt resting in bed upon arrival and agreeable to getting OOB for bathing/dressing and therapy. Supine>sit EOB with supervision (HOB elevated and pt using bed rails.) Pt reports she does not have rails at home. Pt also reports that her bed has 2 mattresses and surface of bed is hip tall. Pt completed bathing/dressing tasks at sink with CGA using AE for LB clothing mgmt. Pt transitioned to gym and engaged in standing tasks at [employer]. Reaction time with sequencing 1.2 to 1.3 secs using LUE and crossing midline. Pt challenged with sequencing tasks and required max verbal cues for numers/letter sequencing. Pt reports the task was extremely frustrating. Pt requested use of toilet and returned to room. Amb with RW into bathroom with CGA. CGA for clothing mgmt. Pt remained on toilet and LPN Erica notified.   Therapy Documentation Precautions:  Precautions Precautions: Fall Precaution Comments: HOH Restrictions Weight Bearing Restrictions: No RLE Weight Bearing: Weight bearing as tolerated Pain:  Pt reports 2/10 pain with weight bearing; repositioned and rest   Therapy/Group: Individual Therapy  Leroy Libman 02/13/2022, 9:34 AM

## 2022-02-13 NOTE — Progress Notes (Signed)
Physical Therapy Session Note  Patient Details  Name: [name] MRN: [id number] Date of Birth: 1941/03/23  Today's Date: 02/13/2022 PT Individual Time: 1010-1055 PT Individual Time Calculation (min): 45 min   Short Term Goals: Week 1:  PT Short Term Goal 1 (Week 1): Pt will complete bed mobility with minA PT Short Term Goal 1 - Progress (Week 1): Met PT Short Term Goal 2 (Week 1): Pt will complete bed<>chair transfers with minA and LRAD PT Short Term Goal 2 - Progress (Week 1): Met PT Short Term Goal 3 (Week 1): Pt will ambulate 31f with minA and LRAD PT Short Term Goal 3 - Progress (Week 1): Met PT Short Term Goal 4 (Week 1): Pt will initiate stair training PT Short Term Goal 4 - Progress (Week 1): Met Week 2:  PT Short Term Goal 1 (Week 2): =LTG due to ELOS  Skilled Therapeutic Interventions/Progress Updates: Pt presented in bed agreeable to therapy. Pt denies pain at rest, increases with mobility but no pain intervention requested and states that pain decreased with rest. LLattie Haw RT present throughout session. Session focused on community integration and general conditioning.  Performed supine to sit from flat bed without bed rail requiring minA for truncal support as pt transitioned to long sit then rotated BLE off bed. Pt then performed Sit to stand with CGA and increased time and ambulated ~760fwith RW and CGA. Pt encouraged to increase heel strike and to decrease BUE support as pt c/o increased BUE pain towards end of ambulation. Pt then transported remaining distance to rehab gym and practiced stepping up onto 5in curb step backwards with RW to simulate home entry. Pt required verbal cues however was able to perform with CGA. Pt then transported to gift shop and ambulated around gift shop for community integration and general endurance. Pt was able to navigate small spaces and perform sidestepping with verbal cues and CGA. PTA observed as pt became more fatigued pt would stand with  heel lifted off ground and decreased heel strike noted with ambulation. Pt took x 2 seated rest breaks in gift shop however ambulated distances of >10036fOnce outside of gift shop after therapeutic rest with RT pt propelled w/c using BUE to elevators ~100f83ft transported remaining distance to unit and back to room. Performed ambulatory transfer to bed with RW and CGA and pt was able to transfer to supine with supervision. Pt repositioned to comfort and left with bed alarm on, call bell within reach and needs met.  Ambulation/gait training;Discharge planning;Functional mobility training;Psychosocial support;Therapeutic Activities;Therapeutic Exercise;Skin care/wound management;Neuromuscular re-education;Disease management/prevention;Balance/vestibular training;Cognitive remediation/compensation;DME/adaptive equipment instruction;Pain management;UE/LE Strength taining/ROM;UE/LE Coordination activities;Stair training;Patient/family education;Community reintegration   Therapy Documentation Precautions:  Precautions Precautions: Fall Precaution Comments: HOH Restrictions Weight Bearing Restrictions: No RLE Weight Bearing: Weight bearing as tolerated General:   Vital Signs: Therapy Vitals Temp: 98 F (36.7 C) Pulse Rate: 83 Resp: 17 BP: (!) 115/53 Patient Position (if appropriate): Lying Oxygen Therapy SpO2: 100 % O2 Device: Room Air Pain: Pain Assessment Pain Score: 0-No pain Mobility:   Locomotion :    Trunk/Postural Assessment :    Balance:   Exercises:   Other Treatments:      Therapy/Group: Individual Therapy  Himani Corona 02/13/2022, 4:11 PM

## 2022-02-13 NOTE — Progress Notes (Signed)
Recreational Therapy Session Note  Patient Details  Name: KAYLIAH TINDOL MRN: [id number] Date of Birth: 05-29-1941 Today's Date: 02/13/2022  Pain: c/o soreness with mobility Skilled Therapeutic Interventions/Progress Updates:   Goals:  MET Pt will ambulate in simulated community setting with RW with Contact guard assist.  Pt will navigated around community obstacles ambulatory level with RW min cues/ CGA.    Session focused on activity tolerance and community mobility during co-treat with PT.  Pt ambulated throughout the hospital gift shop with RW and contact guard assist. Pt required 1 seated rest break due to fatigue.  Pt with c/o B shoulder pain.  Discussed pts compensation of putting weight through [place] in an attempt to reduce pain and WBing on RLE.  Pt stated understanding/agreement and attempted to modify positioning when walking but required verbal cues to do so.  .   Therapy/Group: Co-Treatment   Elizah Mierzwa 02/13/2022, 11:45 AM

## 2022-02-14 LAB — BASIC METABOLIC PANEL
Anion gap: 7 (ref 5–15)
BUN: 9 mg/dL (ref 8–23)
CO2: 26 mmol/L (ref 22–32)
Calcium: 9.6 mg/dL (ref 8.9–10.3)
Chloride: 105 mmol/L (ref 98–111)
Creatinine, Ser: 0.98 mg/dL (ref 0.44–1.00)
GFR, Estimated: 58 mL/min — ABNORMAL LOW (ref >60)
Glucose, Bld: 95 mg/dL (ref 70–99)
Potassium: 4.1 mmol/L (ref 3.5–5.1)
Sodium: 138 mmol/L (ref 135–145)

## 2022-02-14 MED ORDER — SORBITOL 70 % SOLN
30.0000 mL | Freq: Once | Status: AC
  Administered 2022-02-14: 30 mL via ORAL
  Filled 2022-02-14: qty 30

## 2022-02-14 MED ORDER — POLYETHYLENE GLYCOL 3350 17 G PO PACK
17.0000 g | PACK | Freq: Every day | ORAL | Status: DC
  Administered 2022-02-14 – 2022-02-19 (×6): 17 g via ORAL
  Filled 2022-02-14 (×6): qty 1

## 2022-02-14 NOTE — Progress Notes (Signed)
PROGRESS NOTE   Subjective/Complaints:  Pt hasn't had BM in 3 days.  Slept OK Still depressed and poor appetite- started Megace but hasn't helped so far.    ROS:  Pt denies SOB, abd pain, CP, N/V/C/D, and vision changes  Objective:   No results found.  Recent Labs    02/12/22 0656  WBC 9.3  HGB 12.3  HCT 38.7  PLT 421*    Recent Labs    02/13/22 0537 02/14/22 0548  NA 139 138  K 3.3* 4.1  CL 107 105  CO2 25 26  GLUCOSE 93 95  BUN 10 9  CREATININE 1.01* 0.98  CALCIUM 9.4 9.6     Intake/Output Summary (Last 24 hours) at 02/14/2022 0831 Last data filed at 02/13/2022 2127 Gross per 24 hour  Intake 1230 ml  Output --  Net 1230 ml        Physical Exam: Vital Signs Blood pressure 132/72, pulse 72, temperature 98.9 F (37.2 C), resp. rate 14, height '5\' 4"'$  (1.626 m), weight 75.8 kg, SpO2 99 %.           General: awake, alert, appropriate, sitting up in bed; ate <25% tray; NAD HENT: conjugate gaze; oropharynx moist CV: regular rate; no JVD Pulmonary: CTA B/L; no W/R/R- good air movement GI: soft, NT, slightly distended, hypoactive (+)BS Psychiatric: appropriate but still depressed affect- open about mood Neurological: Ox3 Musculoskeletal:     Cervical back: Neck supple. No tenderness.     Comments: LUE 5/5 RUE- deltoid 2-/5- can only lift arm to 20-30 degrees abduction of flexion Otherwise 5-/5 in RUE- cannot participate in empty can test- obvious RTC tear- no change LLE- 5/5 RLE- HF 2-/5; otherwise 5/5 in RLE  Skin: R hip has dressing in place- mod edema, no erythema; looks stable- less edema/swelling- looks good  Neurological:     General: No focal deficit present.     Mental Status: She is alert and oriented to person, place, and time.     Comments: Patient is alert.  No acute distress.   Assessment/Plan: 1. Functional deficits which require 3+ hours per day of interdisciplinary  therapy in a comprehensive inpatient rehab setting. Physiatrist is providing close team supervision and 24 hour management of active medical problems listed below. Physiatrist and rehab team continue to assess barriers to discharge/monitor patient progress toward functional and medical goals  Care Tool:  Bathing    Body parts bathed by patient: Right arm, Left arm, Chest, Abdomen, Front perineal area, Buttocks, Right upper leg, Face   Body parts bathed by helper: Right lower leg, Left lower leg     Bathing assist Assist Level: Minimal Assistance - Patient > 75%     Upper Body Dressing/Undressing Upper body dressing   What is the patient wearing?: Pull over shirt    Upper body assist Assist Level: Set up assist    Lower Body Dressing/Undressing Lower body dressing      What is the patient wearing?: Pants     Lower body assist Assist for lower body dressing: Contact Guard/Touching assist     Toileting Toileting    Toileting assist Assist for toileting: Contact Guard/Touching assist  Transfers Chair/bed transfer  Transfers assist  Chair/bed transfer activity did not occur: Safety/medical concerns  Chair/bed transfer assist level: Contact Guard/Touching assist     Locomotion Ambulation   Ambulation assist      Assist level: Contact Guard/Touching assist Assistive device: Walker-rolling Max distance: 90'   Walk 10 feet activity   Assist  Walk 10 feet activity did not occur: Safety/medical concerns  Assist level: Contact Guard/Touching assist Assistive device: Walker-rolling   Walk 50 feet activity   Assist Walk 50 feet with 2 turns activity did not occur: Safety/medical concerns  Assist level: Contact Guard/Touching assist Assistive device: Walker-rolling    Walk 150 feet activity   Assist Walk 150 feet activity did not occur: Safety/medical concerns         Walk 10 feet on uneven surface  activity   Assist Walk 10 feet on uneven  surfaces activity did not occur: Safety/medical concerns         Wheelchair     Assist Is the patient using a wheelchair?: Yes Type of Wheelchair: Manual    Wheelchair assist level: Supervision/Verbal cueing Max wheelchair distance: 4f    Wheelchair 50 feet with 2 turns activity    Assist        Assist Level: Moderate Assistance - Patient 50 - 74% (per PT documentation of 35 feet supervision)   Wheelchair 150 feet activity     Assist      Assist Level: Total Assistance - Patient < 25% (per PT documentation of 35 feet supervision)   Blood pressure 132/72, pulse 72, temperature 98.9 F (37.2 C), resp. rate 14, height '5\' 4"'$  (1.626 m), weight 75.8 kg, SpO2 99 %.  Medical Problem List and Plan: 1. Functional deficits secondary to impacted subcapital right hip fracture after fall.  Status post ORIF 02/02/2022.  Weightbearing as tolerated.             -patient may  shower- with Aquacel dressing in place             -ELOS/Goals: 10-14 days Supervision to mod I  D/c 6/7  Con't CIR- PT and OT 2.  Antithrombotics: -DVT/anticoagulation:  Pharmaceutical: Lovenox x6 weeks versus transition to Xarelto at discharge.  Check vascular study             -antiplatelet therapy: N/A 3. Pain Management: Hydrocodone as needed- encouraged pt to take for therapy.   6/1- taking ~ 1x/day Norco- con't regimen-  4. Mood: Wellbutrin 300 mg daily, Adderall 30 mg twice daily, melatonin 3 mg nightly  5/25- pt asking to increase meds- will add Lexapro 5 mg daily instead of increasing wellbutrin since on Wellbutrin, Adderall and Rocephin, can lower seizure threshold. No SSRI's in allergy list.    5/26- went over changes with pt- also pt seeing neuropsych today- went over pt issues with them.   5/31- increased lexapro to 10 mg daily per d/w team- still depressed.   6/2- will see if Neruopsych can see again=- pt still depressed- explained it takes a few days for meds to hopefully take effect.               -antipsychotic agents: N/A 5. Neuropsych: This patient is capable of making decisions on her own behalf. 6. Skin/Wound Care: Routine skin checks 7. Fluids/Electrolytes/Nutrition: Routine in and out with follow-up chemistries 8.  Leukocytosis.  Empiric Rocephin.    5/25- U/A was large leuks and (+) nitrites- even though Urine Cx (-)- since had large leuks and nitrites,  will con't Rocephin for 5 days total.   5/28- IV ABX done- can remove IV 9.  Hypertension.  Norvasc 10 mg daily, Cozaar 100 mg daily.  Monitor with increased mobility  6/1- BP very slightly elevated- monitor for trend.   6/2- BP controlled- con't regimen 10.  Hypothyroidism.  Synthroid 11.  Obesity.  BMI 28.23.  Dietary follow-up 12. R Rotator cuff tear- non surgical - pt didn't want surgery right now- pain control for now 13. Constipation- will give Sorbitol now for BM tonight hopefully.    5/25- give MOM- see if can get results- if none by tomorrow, will check KUB  5/26- will order Sorbitol 60cc after therapy- but also ordered KUB to make sure no obstruction/ileus, since had surgery and no BM since surgery.   5/27- KUB nonobstructive- will give lactulose 30G and follow with soap suds enema. 5/28- very large BM with lactulose  5/29 Reports she feels better after BM yesterda 5/30- LBM Saturday night- will give Sorbitol 45 cc after therapy and increased Senna to 2 tabs BID 5/31- Had large BM yesterday- was incontinent due to Sorbitol  6/1- LBM 2 days ago- doesn't feel constipated-wait til tomorrow until intervening.  6/2- LBM 3 days ago- will give Sorbitol and increase bowel meds- on Senna 2 tabs BID and colace- will add Miralax.  14. Insomnia  5/27- will order Trazodone 25-50 mg QHS prn for sleep  5/29 Pt using PRN Trazodone for sleep, continue current regimen 15. Hypokalemia  5/31- K+ 3.0- will give 40 MEq x3 and recheck Thursday  6/1- K+ up to 3.3 with 2 doses- will give 1 more dose and already had last dose  scheduled/given this Am after labs- K+ up to 3.3- will recheck in AM  6/2- K+ up to 4.1- con't to monitor- recheck Monday   16. Poor appetite- will start Megace to help- likely due to depressed mood.   I spent a total of 37  minutes on total care today- >50% coordination of care- due to d/w pt prolonged about mood and appetite and how we are treating   LOS: 10 days A FACE TO FACE EVALUATION WAS PERFORMED  [name] 02/14/2022, 8:31 AM

## 2022-02-14 NOTE — Progress Notes (Signed)
Physical Therapy Session Note  Patient Details  Name: [name] MRN: [id number] Date of Birth: [birth year]  Today's Date: 02/14/2022 PT Individual Time: 1405-1500 PT Individual Time Calculation (min): 55 min   Short Term Goals: Week 2:  PT Short Term Goal 1 (Week 2): =LTG due to ELOS  Skilled Therapeutic Interventions/Progress Updates: Pt presented in w/c handoff from OT agreeable to therapy. Session focused on hands on family education. Discussed with husband [name] pt's current mobility status and on track to d/c at supervision level. Discussed with pt and husband also OPPT vs HHPT as pt expressed desire to do OOPT earlier in week. Explained differences and that pt is appropriate for either one. Husband and wife confirmed that he would rather her start of HHPT vs OPPT (updated sticky note). Pt then transported to ortho gym and pt was able to demonstrate car transfer to sedan height vehicle with CGA. [name] was able to demonstrate appropriate cues as well as stabilizing RW when exiting vehicle. Pt then transported to rehab gym and performed block practice step up to 5in step to simulate home entry. Pt was able to perform with overall CGA however did require verbal cues for sequencing which [name] was able to provide. PTA provided education to [facility] regarding pt's gait and difficulty maintaining weight bearing on RLE. PTA discussed to continue to encourage pt to increase weight bearing to maintain gait mechanics. Pt then ambulated CGA to 4W nsg station with PTA and husband observing then after seated rest pt ambulated remaining distance to room with husband providing CGA. In room pt transferred to bed and performed sit to supine with supervision, use of bed features and increased effort. Pt left in bed at end of session with bed alarm on, call bell within reach and needs met.      Therapy Documentation Precautions:  Precautions Precautions: Fall Precaution Comments: HOH Restrictions Weight  Bearing Restrictions: No RLE Weight Bearing: Weight bearing as tolerated General:   Vital Signs: Therapy Vitals Temp: 98 F (36.7 C) Temp Source: Oral Pulse Rate: 70 Resp: 18 BP: (!) 120/51 Patient Position (if appropriate): Lying Oxygen Therapy SpO2: 99 % O2 Device: Room Air Pain:   Mobility:   Locomotion :    Trunk/Postural Assessment :    Balance:   Exercises:   Other Treatments:      Therapy/Group: Individual Therapy  [name] 02/14/2022, 4:20 PM

## 2022-02-14 NOTE — Progress Notes (Signed)
Occupational Therapy Session Note  Patient Details  Name: [name] MRN: [id number] Date of Birth: Dec 21, 1940  Today's Date: 02/14/2022 OT Individual Time: 1300-1359 OT Individual Time Calculation (min): 59 min    Short Term Goals: Week 2:  OT Short Term Goal 1 (Week 2): STG=LTG 2/2 ELOS (continue wotking towards overall supervision LTGs)  Skilled Therapeutic Interventions/Progress Updates:    Pt resting in w/c upon arrival with husband present for education. OT intervention with focus on TTB/tub seat recommendations, bed mobility, and safety awareness. Pt attempted to step into simulated walk-in shower but was unable to bear weight through RLE adequately to lift LLE into shower. Recommended TTB and use of tub. Pt practiced with min A. Info provided to husband. Recommended use of BSC at home and CSW notified. Pt practiced sit<>supine on therapy mat with supervision. Pt's husband states he will have top mattress removed from bed at home to lower height of bed and facilitate ease of use on discharge. Pt retuned to room and reamined in w/c awaiting PT. Husband present.   Therapy Documentation Precautions:  Precautions Precautions: Fall Precaution Comments: HOH Restrictions Weight Bearing Restrictions: No RLE Weight Bearing: Weight bearing as tolerated    Pain:  Pt grimaces with weight bearing; rest   Therapy/Group: Individual Therapy  Leroy Libman 02/14/2022, 2:39 PM

## 2022-02-14 NOTE — Progress Notes (Signed)
Occupational Therapy Session Note  Patient Details  Name: [name] MRN: [id number] Date of Birth: [birth year]  Today's Date: 02/14/2022 OT Individual Time: 1010-1100 OT Individual Time Calculation (min): 50 min    Short Term Goals: Week 1:  OT Short Term Goal 1 (Week 1): Pt will complete sit<>stand transfer with CGA OT Short Term Goal 1 - Progress (Week 1): Met OT Short Term Goal 2 (Week 1): Pt will complete stand pivot w/c<>BSC with CGA OT Short Term Goal 2 - Progress (Week 1): Met OT Short Term Goal 3 (Week 1): Pt will donn/doff pants with mod assist using AE as needed OT Short Term Goal 3 - Progress (Week 1): Met  Skilled Therapeutic Interventions/Progress Updates:    Pt received supine in bed and agreeable to OT tx. C/o of being tired and in pain in RUE during bed mobility/transfer. Appeared lethargic and not wanting to do much but agreeable to take a shower. Required Min A supine > sit to pull up EOB. Utilized bed rails and HOB was elevated. Pt stood EOB CGA and completed functional mobility to shower chair with RW. Showered with (S) and only requested assistance for drying her legs. Donned clothing seated and completed oral care with (S) in her w/c at the sink. Pt left seated in w/c with chair alarm on, call bell in reach, and all needs met.  Therapy Documentation Precautions:  Precautions Precautions: Fall Precaution Comments: HOH Restrictions Weight Bearing Restrictions: No RLE Weight Bearing: Weight bearing as tolerated   Therapy/Group: Individual Therapy  Catalina Lunger 02/14/2022, 11:19 AM

## 2022-02-14 NOTE — Progress Notes (Signed)
Occupational Therapy Session Note  Patient Details  Name: [name] MRN: [id number] Date of Birth: 06/05/1941  Today's Date: 02/14/2022 OT Individual Time: 4782-9562 OT Individual Time Calculation (min): 70 min    Short Term Goals: Week 2:  OT Short Term Goal 1 (Week 2): STG=LTG 2/2 ELOS (continue wotking towards overall supervision LTGs)  Skilled Therapeutic Interventions/Progress Updates:    Pt resting in bed upon arrival with LPN Erica administering medications. Pt reminded pt that she should have her pain meds prior to therapy. Pt scheduled for additional OT session later in the morning and stated she would take a shower at that time. Pt requested to used toilet. Supine>sit EOB with HOB elevated and use of bed rails-CGA. Sit>stand and amb with RW to bathroom with CGA. Clothing mgmt and hygiene following BM with CGA. Pt returned to room and sat in w/c at sink to complete grooming tasks. Pt requires assistance with applying hearing aids and after checking them, decided to place them on charge again. Pt requested to return to bed to rest. Amb with RW at [facility]. Sit>supine in bed with use of bed rails and bed flat-CGA. Pt does not have bed rails at home. Pt remained in bed with all needs within reach and bed alarm activated.   Therapy Documentation Precautions:  Precautions Precautions: Fall Precaution Comments: HOH Restrictions Weight Bearing Restrictions: No RLE Weight Bearing: Weight bearing as tolerated   Pain: Pt reports 0/10 pain at rest, increasing to 4/10 pain with activity; rest and repositioned   Therapy/Group: Individual Therapy  Leroy Libman 02/14/2022, 9:26 AM

## 2022-02-15 DIAGNOSIS — R52 Pain, unspecified: Secondary | ICD-10-CM

## 2022-02-15 DIAGNOSIS — F320 Major depressive disorder, single episode, mild: Secondary | ICD-10-CM

## 2022-02-15 DIAGNOSIS — R630 Anorexia: Secondary | ICD-10-CM

## 2022-02-15 NOTE — Progress Notes (Signed)
PROGRESS NOTE   Subjective/Complaints: Eating lunch Expresses no complaints Did well with therapy today ambulating almost 300 feet   ROS:  Pt  denies SOB, abd pain, CP, N/V/C/D, and vision changes  Objective:   No results found.  No results for input(s): WBC, HGB, HCT, PLT in the last 72 hours.   Recent Labs    02/13/22 0537 02/14/22 0548  NA 139 138  K 3.3* 4.1  CL 107 105  CO2 25 26  GLUCOSE 93 95  BUN 10 9  CREATININE 1.01* 0.98  CALCIUM 9.4 9.6     Intake/Output Summary (Last 24 hours) at 02/15/2022 1501 Last data filed at 02/15/2022 1338 Gross per 24 hour  Intake 498 ml  Output --  Net 498 ml        Physical Exam: Vital Signs Blood pressure (!) 115/52, pulse 86, temperature 98.3 F (36.8 C), temperature source Oral, resp. rate 18, height '5\' 4"'$  (1.626 m), weight 75.8 kg, SpO2 96 %. Gen: no distress, normal appearing HEENT: oral mucosa pink and moist, NCAT Cardio: Reg rate Chest: normal effort, normal rate of breathing Abd: soft, non-distended Ext: no edema Psychiatric: appropriate but still depressed affect- open about mood Neurological: Ox3 Musculoskeletal:     Cervical back: Neck supple. No tenderness.     Comments: LUE 5/5 RUE- deltoid 2-/5- can only lift arm to 20-30 degrees abduction of flexion Otherwise 5-/5 in RUE- cannot participate in empty can test- obvious RTC tear- no change LLE- 5/5 RLE- HF 2-/5; otherwise 5/5 in RLE  Skin: R hip has dressing in place- mod edema, no erythema; looks stable- less edema/swelling- looks good  Neurological:     General: No focal deficit present.     Mental Status: She is alert and oriented to person, place, and time.     Comments: Patient is alert.  No acute distress.   Assessment/Plan: 1. Functional deficits which require 3+ hours per day of interdisciplinary therapy in a comprehensive inpatient rehab setting. Physiatrist is providing close team  supervision and 24 hour management of active medical problems listed below. Physiatrist and rehab team continue to assess barriers to discharge/monitor patient progress toward functional and medical goals  Care Tool:  Bathing    Body parts bathed by patient: Right arm, Left arm, Chest, Abdomen, Front perineal area, Buttocks, Right upper leg, Face   Body parts bathed by helper: Right lower leg, Left lower leg     Bathing assist Assist Level: Minimal Assistance - Patient > 75%     Upper Body Dressing/Undressing Upper body dressing   What is the patient wearing?: Pull over shirt    Upper body assist Assist Level: Set up assist    Lower Body Dressing/Undressing Lower body dressing      What is the patient wearing?: Pants     Lower body assist Assist for lower body dressing: Contact Guard/Touching assist     Toileting Toileting    Toileting assist Assist for toileting: Contact Guard/Touching assist     Transfers Chair/bed transfer  Transfers assist  Chair/bed transfer activity did not occur: Safety/medical concerns  Chair/bed transfer assist level: Contact Guard/Touching assist     Locomotion  Ambulation   Ambulation assist      Assist level: Contact Guard/Touching assist Assistive device: Walker-rolling Max distance: 90'   Walk 10 feet activity   Assist  Walk 10 feet activity did not occur: Safety/medical concerns  Assist level: Contact Guard/Touching assist Assistive device: Walker-rolling   Walk 50 feet activity   Assist Walk 50 feet with 2 turns activity did not occur: Safety/medical concerns  Assist level: Contact Guard/Touching assist Assistive device: Walker-rolling    Walk 150 feet activity   Assist Walk 150 feet activity did not occur: Safety/medical concerns         Walk 10 feet on uneven surface  activity   Assist Walk 10 feet on uneven surfaces activity did not occur: Safety/medical concerns          Wheelchair     Assist Is the patient using a wheelchair?: Yes Type of Wheelchair: Manual    Wheelchair assist level: Supervision/Verbal cueing Max wheelchair distance: 26f    Wheelchair 50 feet with 2 turns activity    Assist        Assist Level: Moderate Assistance - Patient 50 - 74% (per PT documentation of 35 feet supervision)   Wheelchair 150 feet activity     Assist      Assist Level: Total Assistance - Patient < 25% (per PT documentation of 35 feet supervision)   Blood pressure (!) 115/52, pulse 86, temperature 98.3 F (36.8 C), temperature source Oral, resp. rate 18, height '5\' 4"'$  (1.626 m), weight 75.8 kg, SpO2 96 %.  Medical Problem List and Plan: 1. Functional deficits secondary to impacted subcapital right hip fracture after fall.  Status post ORIF 02/02/2022.  Weightbearing as tolerated.             -patient may  shower- with Aquacel dressing in place             -ELOS/Goals: 10-14 days Supervision to mod I  D/c 6/7  Continue CIR- PT and OT 2.  Antithrombotics: -DVT/anticoagulation:  Pharmaceutical: Lovenox x6 weeks versus transition to Xarelto at discharge.  [place]oreareviewed and shows no clots.              -antiplatelet therapy: N/A 3. Pain Management: Hydrocodone as needed- encouraged pt to take for therapy.   6/1- taking ~ 1x/day Norco- con't regimen-  4. Depression: continue Wellbutrin 300 mg daily, Adderall 30 mg twice daily, melatonin 3 mg nightly  5/25- pt asking to increase meds- will add Lexapro 5 mg daily instead of increasing wellbutrin since on Wellbutrin, Adderall and Rocephin, can lower seizure threshold. No SSRI's in allergy list.    5/26- went over changes with pt- also pt seeing neuropsych today- went over pt issues with them.   5/31- increased lexapro to 10 mg daily per d/w team- still depressed.   6/2- will see if Neruopsych can see again=- pt still depressed- explained it takes a few days for meds to hopefully take effect.               -antipsychotic agents: N/A 5. Neuropsych: This patient is capable of making decisions on her own behalf. 6. Skin/Wound Care: Routine skin checks 7. Fluids/Electrolytes/Nutrition: Routine in and out with follow-up chemistries 8.  Leukocytosis.  Empiric Rocephin.    5/25- U/A was large leuks and (+) nitrites- even though Urine Cx (-)- since had large leuks and nitrites, will con't Rocephin for 5 days total.   5/28- IV ABX done- can remove IV 9.  Hypertension.  Norvasc 10 mg daily, Cozaar 100 mg daily.  Monitor with increased mobility  6/1- BP very slightly elevated- monitor for trend.   6/2- BP controlled- con't regimen 10.  Hypothyroidism.  Synthroid 11.  Obesity.  BMI 28.23.  Dietary follow-up 12. R Rotator cuff tear- non surgical - pt didn't want surgery right now- pain control for now 13. Constipation- will give Sorbitol now for BM tonight hopefully.    5/25- give MOM- see if can get results- if none by tomorrow, will check KUB  5/26- will order Sorbitol 60cc after therapy- but also ordered KUB to make sure no obstruction/ileus, since had surgery and no BM since surgery.   5/27- KUB nonobstructive- will give lactulose 30G and follow with soap suds enema. 5/28- very large BM with lactulose  5/29 Reports she feels better after BM yesterda 5/30- LBM Saturday night- will give Sorbitol 45 cc after therapy and increased Senna to 2 tabs BID 5/31- Had large BM yesterday- was incontinent due to Sorbitol  6/1- LBM 2 days ago- doesn't feel constipated-wait til tomorrow until intervening.  6/2- LBM 3 days ago- will give Sorbitol and increase bowel meds- on Senna 2 tabs BID and colace- will add Miralax.  14. Insomnia  5/27- will order Trazodone 25-50 mg QHS prn for sleep  5/29 Pt using PRN Trazodone for sleep, continue current regimen 15. Hypokalemia  5/31- K+ 3.0- will give 40 MEq x3 and recheck Thursday  6/1- K+ up to 3.3 with 2 doses- will give 1 more dose and already had last dose  scheduled/given this Am after labs- K+ up to 3.3- will recheck in AM  6/2- K+ up to 4.1- con't to monitor- recheck Monday   16. Poor appetite- continue Megace to help- likely due to depressed mood.     LOS: 11 days A FACE TO FACE EVALUATION WAS PERFORMED  [name] 02/15/2022, 3:01 PM

## 2022-02-15 NOTE — Progress Notes (Signed)
Physical Therapy Session Note  Patient Details  Name: [name] MRN: [id number] Date of Birth: [birth year]  Today's Date: 02/15/2022 PT Individual Time: 1003-1102 PT Individual Time Calculation (min): 59 min   Short Term Goals: Week 1:  PT Short Term Goal 1 (Week 1): Pt will complete bed mobility with minA PT Short Term Goal 1 - Progress (Week 1): Met PT Short Term Goal 2 (Week 1): Pt will complete bed<>chair transfers with minA and LRAD PT Short Term Goal 2 - Progress (Week 1): Met PT Short Term Goal 3 (Week 1): Pt will ambulate 44f with minA and LRAD PT Short Term Goal 3 - Progress (Week 1): Met PT Short Term Goal 4 (Week 1): Pt will initiate stair training PT Short Term Goal 4 - Progress (Week 1): Met Week 2:  PT Short Term Goal 1 (Week 2): =LTG due to ELOS  Skilled Therapeutic Interventions/Progress Updates:  Patient supine in bed on entrance to room. Patient alert and agreeable to PT session.   Patient with mild pain complaint throughout session to RLE.  Therapeutic Activity: Bed Mobility: Patient performed supine <> sit with supervision and extra time to move BLE from bed surface and then onto bed surface again at end of session to return to supine. VC/ tc required for mild technique in rise to seated position on EOB. Transfers: Patient performed sit<>stand and stand pivot transfers throughout session with CGA/ close supervision. Toilet transfer performed with close supervision and intermittent CGA with reach for pants and for forward lean in wipe for pericare. Provided verbal cues for maintaining balance.  Gait Training:  Patient ambulated 85 ft down hallway using RW with CGA and close w/c follow for fatigue. Then pt taken outside for outdoor ambulation and for attempt to improve pt's mood. Pt relates that she is feeling rather depressed this day. Outdoor ambulation over uneven and non level surfaces using RW. Pt is able to manage walker over outdoor, paved terrain with CGA.  Reaching 120' x1/ 95' x1 in and out of direct sun.   Neuromuscular Re-ed: NMR facilitated during session with focus on increasing WB to RLE. Pt guided in sit<>stands to no AD. Pt chosen balance over LLE and guided in weight shift toward RLE. Guided in minisquats x10 with focus on midline weight bearing. Descent to sit with cues to maintain balance throughout and thereby reducing need for BUE as pt c/o continued shoulder pain with WB. NMR performed for improvements in motor control and coordination, balance, sequencing, judgement, and self confidence/ efficacy in performing all aspects of mobility at highest level of independence.   Patient supine  in bed at end of session with brakes locked, bed alarm set, and all needs within reach.   Therapy Documentation Precautions:  Precautions Precautions: Fall Precaution Comments: HOH Restrictions Weight Bearing Restrictions: No RLE Weight Bearing: Weight bearing as tolerated General:   Vital Signs:  Pain: Pain Assessment Pain Scale: 0-10 Pain Score: 0-No pain   Therapy/Group: Individual Therapy  JAlger SimonsPT, DPT 02/15/2022, 1:22 PM

## 2022-02-16 MED ORDER — RIVAROXABAN 10 MG PO TABS
10.0000 mg | ORAL_TABLET | Freq: Every day | ORAL | Status: DC
Start: 2022-02-17 — End: 2022-02-19
  Administered 2022-02-17 – 2022-02-19 (×3): 10 mg via ORAL
  Filled 2022-02-16 (×3): qty 1

## 2022-02-16 NOTE — Progress Notes (Signed)
PROGRESS NOTE   Subjective/Complaints: Sleepy this morning Has no complaints   ROS:  Pt denies SOB, abd pain, CP, N/V/C/D, and vision changes  Objective:   No results found.  No results for input(s): WBC, HGB, HCT, PLT in the last 72 hours.   Recent Labs    02/14/22 0548  NA 138  K 4.1  CL 105  CO2 26  GLUCOSE 95  BUN 9  CREATININE 0.98  CALCIUM 9.6     Intake/Output Summary (Last 24 hours) at 02/16/2022 1817 Last data filed at 02/16/2022 1321 Gross per 24 hour  Intake 897 ml  Output --  Net 897 ml        Physical Exam: Vital Signs Blood pressure 134/78, pulse 79, temperature 98.2 F (36.8 C), resp. rate 16, height '5\' 4"'$  (1.626 m), weight 75.8 kg, SpO2 97 %. Gen: no distress, normal appearing, somnolent HEENT: oral mucosa pink and moist, NCAT Cardio: Reg rate Chest: normal effort, normal rate of breathing Abd: soft, non-distended Ext: no edema Psychiatric: appropriate but still depressed affect- open about mood Neurological: Ox3 Musculoskeletal:     Cervical back: Neck supple. No tenderness.     Comments: LUE 5/5 RUE- deltoid 2-/5- can only lift arm to 20-30 degrees abduction of flexion Otherwise 5-/5 in RUE- cannot participate in empty can test- obvious RTC tear- no change LLE- 5/5 RLE- HF 2-/5; otherwise 5/5 in RLE  Skin: R hip has dressing in place- mod edema, no erythema; looks stable- less edema/swelling- looks good  Neurological:     General: No focal deficit present.     Mental Status: She is alert and oriented to person, place, and time.     Comments: Patient is alert.  No acute distress.   Assessment/Plan: 1. Functional deficits which require 3+ hours per day of interdisciplinary therapy in a comprehensive inpatient rehab setting. Physiatrist is providing close team supervision and 24 hour management of active medical problems listed below. Physiatrist and rehab team continue to  assess barriers to discharge/monitor patient progress toward functional and medical goals  Care Tool:  Bathing    Body parts bathed by patient: Right arm, Left arm, Chest, Abdomen, Front perineal area, Buttocks, Right upper leg, Face   Body parts bathed by helper: Right lower leg, Left lower leg     Bathing assist Assist Level: Minimal Assistance - Patient > 75%     Upper Body Dressing/Undressing Upper body dressing   What is the patient wearing?: Pull over shirt    Upper body assist Assist Level: Set up assist    Lower Body Dressing/Undressing Lower body dressing      What is the patient wearing?: Pants     Lower body assist Assist for lower body dressing: Contact Guard/Touching assist     Toileting Toileting    Toileting assist Assist for toileting: Contact Guard/Touching assist     Transfers Chair/bed transfer  Transfers assist  Chair/bed transfer activity did not occur: Safety/medical concerns  Chair/bed transfer assist level: Contact Guard/Touching assist     Locomotion Ambulation   Ambulation assist      Assist level: Contact Guard/Touching assist Assistive device: Walker-rolling Max distance: 150'  Walk 10 feet activity   Assist  Walk 10 feet activity did not occur: Safety/medical concerns  Assist level: Contact Guard/Touching assist Assistive device: Walker-rolling   Walk 50 feet activity   Assist Walk 50 feet with 2 turns activity did not occur: Safety/medical concerns  Assist level: Contact Guard/Touching assist Assistive device: Walker-rolling    Walk 150 feet activity   Assist Walk 150 feet activity did not occur: Safety/medical concerns  Assist level: Contact Guard/Touching assist Assistive device: Walker-rolling    Walk 10 feet on uneven surface  activity   Assist Walk 10 feet on uneven surfaces activity did not occur: Safety/medical concerns         Wheelchair     Assist Is the patient using a  wheelchair?: Yes Type of Wheelchair: Manual    Wheelchair assist level: Supervision/Verbal cueing Max wheelchair distance: 44f    Wheelchair 50 feet with 2 turns activity    Assist        Assist Level: Moderate Assistance - Patient 50 - 74% (per PT documentation of 35 feet supervision)   Wheelchair 150 feet activity     Assist      Assist Level: Total Assistance - Patient < 25% (per PT documentation of 35 feet supervision)   Blood pressure 134/78, pulse 79, temperature 98.2 F (36.8 C), resp. rate 16, height '5\' 4"'$  (1.626 m), weight 75.8 kg, SpO2 97 %.  Medical Problem List and Plan: 1. Functional deficits secondary to impacted subcapital right hip fracture after fall.  Status post ORIF 02/02/2022.  Weightbearing as tolerated.             -patient may  shower- with Aquacel dressing in place             -ELOS/Goals: 10-14 days Supervision to mod I  D/c 6/7  Continue CIR- PT and OT 2.  Antithrombotics: -DVT/anticoagulation:  Pharmaceutical: Lovenox x6 weeks versus transition to Xarelto at discharge.  UKoreareviewed and shows no clots.              -antiplatelet therapy: N/A 3. Pain: continue Hydrocodone as needed- encouraged pt to take for therapy.   6/1- taking ~ 1x/day Norco- con't regimen-  4. Depression: continue Wellbutrin 300 mg daily, Adderall 30 mg twice daily, melatonin 3 mg nightly  5/25- pt asking to increase meds- will add Lexapro 5 mg daily instead of increasing wellbutrin since on Wellbutrin, Adderall and Rocephin, can lower seizure threshold. No SSRI's in allergy list.    5/26- went over changes with pt- also pt seeing neuropsych today- went over pt issues with them.   5/31- increased lexapro to 10 mg daily per d/w team- still depressed.   6/2- will see if Neruopsych can see again=- pt still depressed- explained it takes a few days for meds to hopefully take effect.              -antipsychotic agents: N/A 5. Neuropsych: This patient is capable of making  decisions on her own behalf. 6. Skin/Wound Care: Routine skin checks 7. Fluids/Electrolytes/Nutrition: Routine in and out with follow-up chemistries 8.  Leukocytosis.  Empiric Rocephin.    5/25- U/A was large leuks and (+) nitrites- even though Urine Cx (-)- since had large leuks and nitrites, will con't Rocephin for 5 days total.   5/28- IV ABX done- can remove IV 9.  Hypertension.  continue Norvasc 10 mg daily, Cozaar 100 mg daily.  Monitor with increased mobility  6/1- BP very slightly elevated- monitor for  trend.   6/2- BP controlled- con't regimen 10.  Hypothyroidism.  Synthroid 11.  Obesity.  BMI 28.23.  Dietary follow-up 12. R Rotator cuff tear- non surgical - pt didn't want surgery right now- pain control for now 13. Constipation- will give Sorbitol now for BM tonight hopefully.    5/25- give MOM- see if can get results- if none by tomorrow, will check KUB  5/26- will order Sorbitol 60cc after therapy- but also ordered KUB to make sure no obstruction/ileus, since had surgery and no BM since surgery.   5/27- KUB nonobstructive- will give lactulose 30G and follow with soap suds enema. 5/28- very large BM with lactulose  5/29 Reports she feels better after BM yesterda 5/30- LBM Saturday night- will give Sorbitol 45 cc after therapy and increased Senna to 2 tabs BID 5/31- Had large BM yesterday- was incontinent due to Sorbitol  6/1- LBM 2 days ago- doesn't feel constipated-wait til tomorrow until intervening.  6/2- LBM 3 days ago- will give Sorbitol and increase bowel meds- on Senna 2 tabs BID and colace- will add Miralax.  14. Insomnia  5/27- will order Trazodone 25-50 mg QHS prn for sleep  5/29 Pt using PRN Trazodone for sleep, continue current regimen 15. Hypokalemia  5/31- K+ 3.0- will give 40 MEq x3 and recheck Thursday  6/1- K+ up to 3.3 with 2 doses- will give 1 more dose and already had last dose scheduled/given this Am after labs- K+ up to 3.3- will recheck in AM  6/2- K+ up  to 4.1- con't to monitor- recheck Monday   16. Poor appetite- continue Megace to help- likely due to depressed mood.     LOS: 12 days A FACE TO FACE EVALUATION WAS PERFORMED  Izora Ribas 02/16/2022, 6:17 PM

## 2022-02-16 NOTE — Progress Notes (Signed)
ANTICOAGULATION CONSULT NOTE - Initial Consult  Pharmacy Consult for rivaroxaban Indication: VTE prophylaxis  No Known Allergies  Patient Measurements: Height: '5\' 4"'$  (162.6 cm) Weight: 75.8 kg (167 lb) IBW/kg (Calculated) : 54.7  Vital Signs: Temp: 98.8 F (37.1 C) (06/04 0335) Temp Source: Oral (06/04 0335) BP: 132/64 (06/04 0335) Pulse Rate: 74 (06/04 0335)  Labs: Recent Labs    02/14/22 0548  CREATININE 0.98    Estimated Creatinine Clearance: 44.1 mL/min (by C-G formula based on SCr of 0.98 mg/dL).   Medical History: Past Medical History:  Diagnosis Date   ALLERGIC RHINITIS 11/30/2008   ALLERGIC RHINITIS 11/30/2008   Anxiety    ASTHMA UNSPECIFIED WITH EXACERBATION 02/17/2008   pt. states she does not and has never has asthma   CAROTID BRUIT, LEFT 04/19/2007   Closed fracture of lateral malleolus 06/04/2009   CLOSED FRACTURE OF METATARSAL BONE 06/04/2009   Depression    Esophageal reflux 11/08/2008   GANGLION CYST 04/19/2007   HYPERTENSION 04/07/2007   Hypothyroidism    INSOMNIA 08/07/2010   OSTEOARTHRITIS 04/07/2007   OSTEOPENIA 03/10/2008   PONV (postoperative nausea and vomiting)    UTI 07/20/2009   Assessment: 81 YOF admitted to CIR after fall. Patient underwent internal fixation on 5/21. Patient was started on lovenox 40 mg SQ q 24 hours for prophylaxis. Pharmacy consulted to transition to rivaroxaban until 7/5.   CBC stable on 5/31. Renal function is at baseline.   Plan:  Stop enoxaparin 40 mg SQ q 24h Start rivaroxaban 10 mg PO daily  until 03/19/2022 Monitor for signs and symptoms of bleeding Pharmacy to monitor peripherally  Thank you for involving pharmacy in this patient's care.  Elita Quick, PharmD PGY1 Ambulatory Care Pharmacy Resident 02/16/2022 1:44 PM  **Pharmacist phone directory can be found on [email or web address] listed under [facility]**

## 2022-02-16 NOTE — Progress Notes (Signed)
Physical Therapy Session Note  Patient Details  Name: [name] MRN: [id number] Date of Birth: Apr 16, 1941  Today's Date: 02/16/2022 PT Individual Time: 1100-1155 PT Individual Time Calculation (min): 55 min   Short Term Goals: Week 2:  PT Short Term Goal 1 (Week 2): =LTG due to ELOS  Skilled Therapeutic Interventions/Progress Updates:    Pt received seated in bed, agreeable to makeup therapy session. Pt reports pain at rest in her R hip, not rated and reports being premedicated prior to session for pain. Utilized rest breaks and repositioning as needed for pain management during session. Seated in bed to sitting EOB with mod A for trunk elevation and RLE management. Assisted pt with donning shoes while seated EOB. Sit to stand with min A to RW during session with cues for anterior weight shift. Ambulatory transfer into bathroom with RW and CGA for balance. Toilet transfer with RW and min A. Pt requires some assist for clothing management, setup A for pericare. Ambulatory transfer back to w/c with RW and CGA. Dependent transport via w/c to therapy gym for time and energy conservation. Ambulation 2 x 150 ft with RW and CGA for balance, antalgic gait pattern with decreased tolerance for stance on RLE, flexed trunk posture with heavy UE reliance. Pt does exhibit improved endurance for gait training this date. Nustep level 3 x 7 min with use of BLE only for global endurance training and RLE ROM stretching. Pt returned to room and left seated in w/c in room with needs in reach at end of session.  Therapy Documentation Precautions:  Precautions Precautions: Fall Precaution Comments: HOH Restrictions Weight Bearing Restrictions: No RLE Weight Bearing: Weight bearing as tolerated      Therapy/Group: Individual Therapy   Excell Seltzer, PT, DPT, CSRS 02/16/2022, 12:07 PM

## 2022-02-16 NOTE — Discharge Summary (Signed)
Physician Discharge Summary  Patient ID: [name] MRN: [id number] DOB/AGE: December 19, 1940 81 y.o.  Admit date: 02/04/2022 Discharge date: 02/19/2022  Discharge Diagnoses:  Principal Problem:   Closed subcapital fracture of neck of right femur [facility]) DVT prophylaxis Mood stabilization Hypothyroidism Obesity Right rotator cuff tear Insomnia  Discharged Condition: Stable  Significant Diagnostic Studies: DG Shoulder Right  Result Date: 02/01/2022 CLINICAL DATA:  Pain after fall EXAM: RIGHT SHOULDER - 2+ VIEW COMPARISON:  Chest x-ray September 13, 2020 FINDINGS: The right humerus is high riding consistent with rotator cuff pathology/tear. Irregularity of the radial head is favored to be chronic. No convincing evidence of acute fracture. Glenohumeral degenerative changes are identified. AC joint degenerative changes are identified. No other acute abnormalities. No dislocation. IMPRESSION: 1. High riding right humerus consistent with rotator cuff pathology/tear. 2. Irregularity and flattening of the humeral head is favored to be nonacute. No convincing evidence of acute fracture. No dislocation. 3. Severe glenohumeral degenerative changes with significant loss of joint space. Electronically Signed   By: Dorise Bullion III M.D.   On: 02/01/2022 16:31   DG Elbow Complete Right  Result Date: 02/01/2022 CLINICAL DATA:  Pain after fall EXAM: RIGHT ELBOW - COMPLETE 3+ VIEW COMPARISON:  None Available. FINDINGS: There is no evidence of fracture, dislocation, or joint effusion. There is no evidence of arthropathy or other focal bone abnormality. Soft tissues are unremarkable. IMPRESSION: Negative. Electronically Signed   By: Dorise Bullion III M.D.   On: 02/01/2022 16:34   DG Abd 1 View  Result Date: 02/07/2022 CLINICAL DATA:  Constipation EXAM: ABDOMEN - 1 VIEW COMPARISON:  None Available. FINDINGS: Nonobstructive bowel-gas pattern. No significantly abnormal amount of retained fecal material  appreciated. There are a few faint calcific densities in the region of the kidneys likely representing nephrolithiasis, measuring up to approximately 4 mm. IMPRESSION: 1. Unremarkable bowel gas pattern. 2. Bilateral nephrolithiasis. Electronically Signed   By: Ofilia Neas M.D.   On: 02/07/2022 08:45   DG CHEST PORT 1 VIEW  Result Date: 02/04/2022 CLINICAL DATA:  [id number]; wheezing EXAM: PORTABLE CHEST 1 VIEW COMPARISON:  Chest radiograph dated September 13, 2020 FINDINGS: The cardiomediastinal silhouette is unchanged in contour.RIGHT-sided calcified granuloma. Mild peribronchial cuffing. No pleural effusion. No pneumothorax. Visualized abdomen is unremarkable. Advanced degenerative changes of the RIGHT shoulder. Status post LEFT shoulder arthroplasty. IMPRESSION: Peribronchial cuffing as can be seen in small airways disease or bronchitis. Electronically Signed   By: Valentino Saxon M.D.   On: 02/04/2022 10:50   DG C-Arm 1-60 Min-No Report  Result Date: 02/02/2022 Fluoroscopy was utilized by the requesting physician.  No radiographic interpretation.   DG HIP UNILAT WITH PELVIS 2-3 VIEWS RIGHT  Result Date: 02/02/2022 CLINICAL DATA:  ORIF RIGHT hip fracture. EXAM: DG HIP (WITH OR WITHOUT PELVIS) 3V RIGHT COMPARISON:  02/01/2022 radiographs FINDINGS: Intraoperative spot views of the RIGHT hip are submitted postoperatively for interpretation. 3 surgical screws are noted traversing the RIGHT femoral neck fracture which appears in near anatomic alignment and position. No gross complicating features are noted. IMPRESSION: Internal fixation of RIGHT femoral neck fracture. Electronically Signed   By: Margarette Canada M.D.   On: 02/02/2022 10:17   DG Hip Unilat With Pelvis 2-3 Views Right  Result Date: 02/01/2022 CLINICAL DATA:  Pain after fall EXAM: DG HIP (WITH OR WITHOUT PELVIS) 2-3V RIGHT COMPARISON:  None Available. FINDINGS: The patient is status post left hip replacement. Visualized portions of the left  hip hardware are in good position. The  pelvic bones are intact. There is a mildly impacted subcapital fracture in the right hip. No dislocation. No other acute abnormalities. IMPRESSION: Mildly impacted subcapital right hip fracture. Electronically Signed   By: Dorise Bullion III M.D.   On: 02/01/2022 16:32   VAS [place] LOWER EXTREMITY VENOUS (DVT)  Result Date: 02/04/2022  Lower Venous DVT Study Patient Name:  [name]  Date of Exam:   02/04/2022 Medical Rec #: [id number]         Accession #:    [id number] Date of Birth: 11/12/1940         Patient Gender: F Patient Age:   39 years Exam Location:  [facility] Procedure:      VAS [place] LOWER EXTREMITY VENOUS (DVT) Referring Phys: Lauraine Rinne --------------------------------------------------------------------------------  Indications: Swelling, s/p fall and surgery.  Risk Factors: Surgery 02-02-2022 Cannulated right hip pinning. Limitations: Patient pain. Comparison Study: No prior studies. Performing Technologist: Darlin Coco RDMS, RVT  Examination Guidelines: A complete evaluation includes B-mode imaging, spectral Doppler, color Doppler, and power Doppler as needed of all accessible portions of each vessel. Bilateral testing is considered an integral part of a complete examination. Limited examinations for reoccurring indications may be performed as noted. The reflux portion of the exam is performed with the patient in reverse Trendelenburg.  +---------+---------------+---------+-----------+----------+--------------+ RIGHT    CompressibilityPhasicitySpontaneityPropertiesThrombus Aging +---------+---------------+---------+-----------+----------+--------------+ CFV      Full           Yes      Yes                                 +---------+---------------+---------+-----------+----------+--------------+ SFJ      Full                                                         +---------+---------------+---------+-----------+----------+--------------+ FV Prox  Full                                                        +---------+---------------+---------+-----------+----------+--------------+ FV Mid   Full                                                        +---------+---------------+---------+-----------+----------+--------------+ FV DistalFull                                                        +---------+---------------+---------+-----------+----------+--------------+ PFV      Full                                                        +---------+---------------+---------+-----------+----------+--------------+ POP  Full           Yes      Yes                                 +---------+---------------+---------+-----------+----------+--------------+ PTV      Full                                                        +---------+---------------+---------+-----------+----------+--------------+ PERO     Full                                                        +---------+---------------+---------+-----------+----------+--------------+ Gastroc  Full                                                        +---------+---------------+---------+-----------+----------+--------------+   +---------+---------------+---------+-----------+----------+--------------+ LEFT     CompressibilityPhasicitySpontaneityPropertiesThrombus Aging +---------+---------------+---------+-----------+----------+--------------+ CFV      Full           Yes      Yes                                 +---------+---------------+---------+-----------+----------+--------------+ SFJ      Full                                                        +---------+---------------+---------+-----------+----------+--------------+ FV Prox  Full                                                         +---------+---------------+---------+-----------+----------+--------------+ FV Mid   Full                                                        +---------+---------------+---------+-----------+----------+--------------+ FV DistalFull                                                        +---------+---------------+---------+-----------+----------+--------------+ PFV      Full                                                        +---------+---------------+---------+-----------+----------+--------------+  POP      Full           Yes      Yes                                 +---------+---------------+---------+-----------+----------+--------------+ PTV      Full                                                        +---------+---------------+---------+-----------+----------+--------------+ PERO     Full                                                        +---------+---------------+---------+-----------+----------+--------------+ Gastroc  Full                                                        +---------+---------------+---------+-----------+----------+--------------+     Summary: RIGHT: - There is no evidence of deep vein thrombosis in the lower extremity.  - No cystic structure found in the popliteal fossa.  LEFT: - There is no evidence of deep vein thrombosis in the lower extremity.  - No cystic structure found in the popliteal fossa.  *See table(s) above for measurements and observations. Electronically signed by Servando Snare MD on 02/04/2022 at 5:29:14 PM.    Final    DG Knee AP/LAT W/Sunrise Left  Result Date: 02/02/2022 CLINICAL DATA:  Pain during hip procedure. EXAM: LEFT KNEE 3 VIEWS COMPARISON:  None Available. FINDINGS: Degenerative changes in the lateral compartment with mild loss of joint space. No fractures, dislocations, or effusions, or other cause for pain identified. IMPRESSION: Lateral compartment degenerative changes. Electronically Signed    By: Dorise Bullion III M.D.   On: 02/02/2022 13:19    Labs:  Basic Metabolic Panel: Recent Labs  Lab 02/12/22 0656 02/13/22 0537 02/14/22 0548 02/18/22 0536  NA 136 139 138 139  K 3.0* 3.3* 4.1 3.9  CL 100 107 105 108  CO2 '27 25 26 26  '$ GLUCOSE 101* 93 95 107*  BUN '8 10 9 14  '$ CREATININE 0.96 1.01* 0.98 1.12*  CALCIUM 9.1 9.4 9.6 9.7    CBC: Recent Labs  Lab 02/12/22 0656 02/18/22 0536  WBC 9.3 8.6  NEUTROABS 5.5 4.3  HGB 12.3 11.5*  HCT 38.7 36.0  MCV 86.4 88.0  PLT 421* 429*    CBG: No results for input(s): GLUCAP in the last 168 hours.  Family history.  Mother with CAD.  Father with COPD.  Denies any colon cancer esophageal cancer or breast cancer  Brief HPI:   [name] is a 81 y.o. right-handed female with history of hypertension hypothyroidism, anxiety/depression osteopenia.  Per chart review lives with spouse.  Independent prior to admission occasional use of a cane.  Presented 02/01/2022 after mechanical fall without loss of consciousness.  Patient was stepping into the garage and missed a step falling on her right side.  X-rays and imaging revealed mildly  impacted subcapital right hip fracture.  Underwent open treatment of right valgus impacted subcapital femoral neck fracture with internal fixation 02/02/2022 per Dr. Georgeanna Harrison.  Weightbearing as tolerated right lower extremity.  Patient also found to have right rotator cuff tear nonoperative at this time.  Placed on Lovenox for DVT prophylaxis transitioning to Xarelto at discharge.  Leukocytosis 15,700 placed on Rocephin empirically urinalysis completed negative nitrite.  Therapy evaluations completed due to patient decreased functional mobility was admitted for comprehensive rehab program.   Hospital Course: SAMARA STANKOWSKI was admitted to rehab 02/04/2022 for inpatient therapies to consist of PT, ST and OT at least three hours five days a week. Past admission physiatrist, therapy team and rehab RN have  worked together to provide customized collaborative inpatient rehab.  Pertaining to patient's impacted subcapital right hip fracture after a fall status post ORIF 02/02/2022.  Weightbearing as tolerated.  Patient would follow-up orthopedic services.  Maintain on Lovenox for DVT prophylaxis venous Doppler studies negative transition to Xarelto at discharge to complete DVT prophylaxis course.  Anxiety/depression Wellbutrin Adderall melatonin as advised Lexapro was added instead of increasing Wellbutrin to help maintain mood status.  Follow-up per neuropsychology.  Blood pressure controlled on Norvasc as well as Cozaar and her HCTZ had been resumed Synthroid ongoing for hypothyroidism.  Noted right rotator cuff tear nonsurgical patient did not want surgery at this time follow-up outpatient.  Bouts of constipation resolved with laxative assistance.   Blood pressures were monitored on TID basis and controlled     Rehab course: During patient's stay in rehab weekly team conferences were held to monitor patient's progress, set goals and discuss barriers to discharge. At admission, patient required moderate assist sit to supine moderate assist supine to sit  Physical exam.  Blood pressure 147/72 pulse 95 temperature 98.4 respirations 18 oxygen saturation is 91% room air Constitutional.  No acute distress HEENT Head.  Normocephalic and atraumatic Eyes.  Pupils round and reactive to light no discharge nystagmus Neck.  Supple nontender no JVD without thyromegaly Cardiac regular rate and rhythm without any extra sounds or murmur heard Respiratory effort normal no respiratory distress without wheeze Abdomen.  Soft nontender positive bowel sounds without rebound Musculoskeletal. Comments.  Left upper extremity 5/5 Right upper extremity deltoids 2 -/5 but can only lift arm to 20-30 degrees abduction of flexion Otherwise 5 -/5 right upper extremity cannot participate in empty can test obvious right rotator cuff  tear Left lower extremity 5/5 Right lower extremity hip flexion to minus/5 otherwise 5/5 Skin.  Right hip incision with Aquacel dressing in place Neurologic.  Alert oriented x3 follows commands  He/She  has had improvement in activity tolerance, balance, postural control as well as ability to compensate for deficits. He/She has had improvement in functional use RUE/LUE  and RLE/LLE as well as improvement in awareness.  Assisted patient with donning shoes while seated edge of bed.  Sit to stand minimal assist rolling walker during sessions with cues ambulating 150 feet contact-guard/min assist.  Patient does exhibit improved endurance for gait training.  Practiced sit to supine therapy mat with supervision for ADLs.  Clothing management and hygiene contact-guard.  Supine to sit edge of bed with head of bed elevated for ADLs.  Full family teaching completed plan discharge to home       Disposition: Discharge to home    Diet: Regular  Special Instructions: No driving smoking or alcohol  Weightbearing as tolerated  Medications at discharge. 1.  Xarelto daily through  03/19/2022 and stop and stop 2.  Tylenol as needed 3.  Norvasc 10 mg p.o. daily 4.  Adderall 30 mg p.o. twice daily 5.  Wellbutrin 300 mg p.o. daily 6.  Colace 100 mg p.o. twice daily 7.  Lexapro 10 mg p.o. daily 8.  Hydrocodone 1 to 2 tablets every 4 hours as needed pain 9.  Synthroid 50 mcg p.o. daily 10.  Cozaar 100 mg p.o. daily 11.  MiraLAX daily hold for loose stools 12.  Melatonin 3 mg nightly 13.  HCTZ 12.5 mg daily   30-35 minutes were spent completing discharge summary and discharge planning     Follow-up Information     Lovorn, Jinny Blossom, MD Follow up.   Specialty: Physical Medicine and Rehabilitation Why: No formal follow-up needed Contact information: [address] [place] [address] [phone]         Georgeanna Harrison, MD Follow up.   Specialty: Orthopedic Surgery Why: Call for  appointment Contact information: [place] [address] [phone]                 Signed: Cathlyn Parsons 02/19/2022, 5:26 AM

## 2022-02-17 MED ORDER — LACTULOSE 10 GM/15ML PO SOLN
20.0000 g | Freq: Once | ORAL | Status: AC
  Administered 2022-02-17: 20 g via ORAL
  Filled 2022-02-17: qty 30

## 2022-02-17 NOTE — Progress Notes (Signed)
Physical Therapy Session Note  Patient Details  Name: [name] MRN: [id number] Date of Birth: 07/04/1941  Today's Date: 02/17/2022 PT Individual Time: 2500-3704; 1352-1450 PT Individual Time Calculation (min): 45 min and 58 min  Short Term Goals: Week 2:  PT Short Term Goal 1 (Week 2): =LTG due to ELOS  Skilled Therapeutic Interventions/Progress Updates:    Session 1: Pt received seated in w/c in room, agreeable to PT session. Pt with some complaints of R hip pain during session, requesting pain medication at end of session. Sit to stand and transfers with RW and CGA during session. Ambulation x 90 ft, x 150 ft with RW and CGA for balance, antalgic gait pattern with cues for upright head and trunk and increased R heel strike during gait. Pt reports BUE soreness with gait due to heavy UE reliance on RW during gait. Static standing balance with no UE support playing horseshoes with CGA for balance, 3 x 15 reps. Toilet transfer with RW and CGA. Pt requires some assist for clothing management, independent for pericare. Pt requests to return to bed at end of session. Sit to supine Supervision. Pt left seated in bed with needs in reach, bed alarm in place.  Session 2: Cotreatment session with LRT. Pt received seated in bed, agreeable to PT session. Pt reports she is having some heartburn, nursing notified and pt able to receive medication for heartburn and to assist with constipation at beginning of therapy session. Supine to sit with Supervision with increased time needed to complete. Sit to stand and transfers with RW and CGA throughout session. Ambulation x 150 ft with RW and CGA for balance, cues for R heel strike and upright trunk during gait. Toilet transfer with RW and CGA, some assist needed for clothing management. Static standing balance with no UE support performing ball toss, progression to ball bounce, and progression to narrower BOS with CGA. Pt left seated in w/c in room with needs in  reach, quick release belt and chair alarm in place at end of session, family present.  Therapy Documentation Precautions:  Precautions Precautions: Fall Precaution Comments: HOH Restrictions Weight Bearing Restrictions: Yes RLE Weight Bearing: Weight bearing as tolerated       Therapy/Group: Individual Therapy   Excell Seltzer, PT, DPT, CSRS 02/17/2022, 12:37 PM

## 2022-02-17 NOTE — Progress Notes (Signed)
PROGRESS NOTE   Subjective/Complaints: Pt reports LBM 4+ days ago- per chart 4 days ago (pt didn't remember BM last week).  Michela Pitcher is trying to have a better attitude.  ROS:  Pt denies SOB, abd pain, CP, N/V/C/D, and vision changes  Objective:   No results found.  No results for input(s): WBC, HGB, HCT, PLT in the last 72 hours.   No results for input(s): NA, K, CL, CO2, GLUCOSE, BUN, CREATININE, CALCIUM in the last 72 hours.    Intake/Output Summary (Last 24 hours) at 02/17/2022 0821 Last data filed at 02/16/2022 1321 Gross per 24 hour  Intake 240 ml  Output --  Net 240 ml        Physical Exam: Vital Signs Blood pressure 132/65, pulse 65, temperature 98.4 F (36.9 C), temperature source Oral, resp. rate 16, height '5\' 4"'$  (1.626 m), weight 75.8 kg, SpO2 97 %.    General: awake, alert, appropriate,  laying supine in bed; NAD HENT: conjugate gaze; oropharynx moist CV: regular rate; no JVD Pulmonary: CTA B/L; no W/R/R- good air movement GI: somewhat firm- a little distended; NT; hypoactive BS Psychiatric: appropriate- less depressed affect Neurological: Ox3- but memory fair  Musculoskeletal:     Cervical back: Neck supple. No tenderness.     Comments: LUE 5/5 RUE- deltoid 2-/5- can only lift arm to 20-30 degrees abduction of flexion Otherwise 5-/5 in RUE- cannot participate in empty can test- obvious RTC tear- no change LLE- 5/5 RLE- HF 2-/5; otherwise 5/5 in RLE  Skin: R hip has dressing in place- mod edema, no erythema; looks stable- less edema/swelling- looks good  Neurological:     General: No focal deficit present.     Mental Status: She is alert and oriented to person, place, and time.     Comments: Patient is alert.  No acute distress.   Assessment/Plan: 1. Functional deficits which require 3+ hours per day of interdisciplinary therapy in a comprehensive inpatient rehab setting. Physiatrist is  providing close team supervision and 24 hour management of active medical problems listed below. Physiatrist and rehab team continue to assess barriers to discharge/monitor patient progress toward functional and medical goals  Care Tool:  Bathing    Body parts bathed by patient: Right arm, Left arm, Chest, Abdomen, Front perineal area, Buttocks, Right upper leg, Face   Body parts bathed by helper: Right lower leg, Left lower leg     Bathing assist Assist Level: Minimal Assistance - Patient > 75%     Upper Body Dressing/Undressing Upper body dressing   What is the patient wearing?: Pull over shirt    Upper body assist Assist Level: Set up assist    Lower Body Dressing/Undressing Lower body dressing      What is the patient wearing?: Pants     Lower body assist Assist for lower body dressing: Contact Guard/Touching assist     Toileting Toileting    Toileting assist Assist for toileting: Contact Guard/Touching assist     Transfers Chair/bed transfer  Transfers assist  Chair/bed transfer activity did not occur: Safety/medical concerns  Chair/bed transfer assist level: Contact Guard/Touching assist     Locomotion Ambulation   Ambulation  assist      Assist level: Contact Guard/Touching assist Assistive device: Walker-rolling Max distance: 150'   Walk 10 feet activity   Assist  Walk 10 feet activity did not occur: Safety/medical concerns  Assist level: Contact Guard/Touching assist Assistive device: Walker-rolling   Walk 50 feet activity   Assist Walk 50 feet with 2 turns activity did not occur: Safety/medical concerns  Assist level: Contact Guard/Touching assist Assistive device: Walker-rolling    Walk 150 feet activity   Assist Walk 150 feet activity did not occur: Safety/medical concerns  Assist level: Contact Guard/Touching assist Assistive device: Walker-rolling    Walk 10 feet on uneven surface  activity   Assist Walk 10 feet on  uneven surfaces activity did not occur: Safety/medical concerns         Wheelchair     Assist Is the patient using a wheelchair?: Yes Type of Wheelchair: Manual    Wheelchair assist level: Supervision/Verbal cueing Max wheelchair distance: 21f    Wheelchair 50 feet with 2 turns activity    Assist        Assist Level: Moderate Assistance - Patient 50 - 74% (per PT documentation of 35 feet supervision)   Wheelchair 150 feet activity     Assist      Assist Level: Total Assistance - Patient < 25% (per PT documentation of 35 feet supervision)   Blood pressure 132/65, pulse 65, temperature 98.4 F (36.9 C), temperature source Oral, resp. rate 16, height '5\' 4"'$  (1.626 m), weight 75.8 kg, SpO2 97 %.  Medical Problem List and Plan: 1. Functional deficits secondary to impacted subcapital right hip fracture after fall.  Status post ORIF 02/02/2022.  Weightbearing as tolerated.             -patient may  shower- with Aquacel dressing in place             -ELOS/Goals: 10-14 days Supervision to mod I  D/c 6/7  Con't CIR- PT and OT 2.  Antithrombotics: -DVT/anticoagulation:  Pharmaceutical: Lovenox x6 weeks versus transition to Xarelto at discharge.  UKoreareviewed and shows no clots.              -antiplatelet therapy: N/A 3. Pain: continue Hydrocodone as needed- encouraged pt to take for therapy.   6/1- taking ~ 1x/day Norco- con't regimen- 6/5- taking Norco occ- ~1x/day- con't regimen  4. Depression: continue Wellbutrin 300 mg daily, Adderall 30 mg twice daily, melatonin 3 mg nightly  5/25- pt asking to increase meds- will add Lexapro 5 mg daily instead of increasing wellbutrin since on Wellbutrin, Adderall and Rocephin, can lower seizure threshold. No SSRI's in allergy list.    5/26- went over changes with pt- also pt seeing neuropsych today- went over pt issues with them.   5/31- increased lexapro to 10 mg daily per d/w team- still depressed.   6/2- will see if Neruopsych  can see again=- pt still depressed- explained it takes a few days for meds to hopefully take effect.   6/5- says feeling brighter this AM- con't regimen             -antipsychotic agents: N/A 5. Neuropsych: This patient is capable of making decisions on her own behalf. 6. Skin/Wound Care: Routine skin checks 7. Fluids/Electrolytes/Nutrition: Routine in and out with follow-up chemistries 8.  Leukocytosis.  Empiric Rocephin.    5/25- U/A was large leuks and (+) nitrites- even though Urine Cx (-)- since had large leuks and nitrites, will con't Rocephin for  5 days total.   5/28- IV ABX done- can remove IV 9.  Hypertension.  continue Norvasc 10 mg daily, Cozaar 100 mg daily.  Monitor with increased mobility  6/5- BP controlled- con't reigmen 10.  Hypothyroidism.  Synthroid 11.  Obesity.  BMI 28.23.  Dietary follow-up 12. R Rotator cuff tear- non surgical - pt didn't want surgery right now- pain control for now 13. Constipation- will give Sorbitol now for BM tonight hopefully.    5/25- give MOM- see if can get results- if none by tomorrow, will check KUB  5/26- will order Sorbitol 60cc after therapy- but also ordered KUB to make sure no obstruction/ileus, since had surgery and no BM since surgery.   5/27- KUB nonobstructive- will give lactulose 30G and follow with soap suds enema. 5/28- very large BM with lactulose  5/29 Reports she feels better after BM yesterda 5/30- LBM Saturday night- will give Sorbitol 45 cc after therapy and increased Senna to 2 tabs BID 6/5- Will give Lactulose 20G after therapy since doesn't respond as well to Sorbitol-- suggested to nurse to use anti-nausea medicine to get her to keep it down- had issues with that as well 14. Insomnia  5/27- will order Trazodone 25-50 mg QHS prn for sleep  5/29 Pt using PRN Trazodone for sleep, continue current regimen 15. Hypokalemia  5/31- K+ 3.0- will give 40 MEq x3 and recheck Thursday  6/1- K+ up to 3.3 with 2 doses- will give 1  more dose and already had last dose scheduled/given this Am after labs- K+ up to 3.3- will recheck in AM  6/5- will recheck in AM   16. Poor appetite- continue Megace to help- likely due to depressed mood.   6/5- no change, but just started Megace last week    LOS: 13 days A FACE TO FACE EVALUATION WAS PERFORMED  Izack Hoogland 02/17/2022, 8:21 AM

## 2022-02-17 NOTE — Progress Notes (Signed)
Recreational Therapy Session Note  Patient Details  Name: [name] MRN: [id number] Date of Birth: 02-06-1941 Today's Date: 02/17/2022  Pain: c/o pain/fatigue with activity, rest breaks provided relief Skilled Therapeutic Interventions/Progress Updates: Pt in the bathroom with PT upon arrival.  Pt with complaints of indigestion, nursing made aware and medication provided for indigestion as well as constipation.  Pt agreeable to therapy.  Session focused on activity tolerance, safety awareness and dynamic standing balance during co-treat with PT.  Pt stood without UE support for ball toss/bounce activity.  Pt required contact guard assist.  Pt required rest breaks due to fatigue and shoulder and hip pain- relieved with rest.  Pt ambulated back to her room from the dayroom at the end of the session with RW and contact guard assist.  Pts son and grand daughter present in the room.  Answers/resources provided for their questions.  Therapy/Group: Co-Treatment   Robben Jagiello 02/17/2022, 3:31 PM

## 2022-02-17 NOTE — Progress Notes (Signed)
Occupational Therapy Session Note  Patient Details  Name: [name] MRN: [id number] Date of Birth: [birth year]  Today's Date: 02/17/2022 OT Individual Time: [phone] OT Individual Time Calculation (min): 75 min    Short Term Goals: Week 2:  OT Short Term Goal 1 (Week 2): STG=LTG 2/2 ELOS (continue wotking towards overall supervision LTGs)  Skilled Therapeutic Interventions/Progress Updates:    OT intervention with focus on LB dressing, functional amb with RW, functional transfers, standing balance, and safety awareness. Pt completed LB dressing with supervision using AE appropriately. All transfers with CGA. Pt completed grooming tasks with supervision seated at sink. Standing activity including corn hole and standing to clean all bean bags. Pt returned to room and remained in w/c with all needs within reach.   Therapy Documentation Precautions:  Precautions Precautions: Fall Precaution Comments: HOH Restrictions Weight Bearing Restrictions: Yes RLE Weight Bearing: Weight bearing as tolerated Pain: Pt reports 0/10 pain at rest and 3/10 RLE pain with weight bearing; rest and repositioned   Therapy/Group: Individual Therapy  Leroy Libman 02/17/2022, 10:14 AM

## 2022-02-17 NOTE — Progress Notes (Signed)
Occupational Therapy Session Note  Patient Details  Name: [name] MRN: [id number] Date of Birth: Mar 23, 1941  Today's Date: 02/17/2022 OT Individual Time: 1130-1202 OT Individual Time Calculation (min): 32 min    Short Term Goals: Week 2:  OT Short Term Goal 1 (Week 2): STG=LTG 2/2 ELOS (continue wotking towards overall supervision LTGs)  Skilled Therapeutic Interventions/Progress Updates:    OT intervention with focus on table task with moderately difficult colored peg task. Pt required min verbal cues for problem solving but completed task without assistance. Standing task at table to clean all pegs. Standing balance with CGA. Pt returned to room and remained in w/c awaiting lunch. All needs within reach.   Therapy Documentation Precautions:  Precautions Precautions: Fall Precaution Comments: HOH Restrictions Weight Bearing Restrictions: Yes RLE Weight Bearing: Weight bearing as tolerated Pain:  Pt reports 5/10 RLE pain with weight bearing; rest   Therapy/Group: Individual Therapy  Leroy Libman 02/17/2022, 12:27 PM

## 2022-02-17 NOTE — Progress Notes (Signed)
Patient ID: [name], female   DOB: [birth year], 81 y.o.   MRN: [id number]  SW met with pt in room to discuss recommendations HH vs OPT. Reports she was not sure, but for SW to speak with her husband. SW left HHA list in room in the event it is decided on [facility].  1314- SW spoke with [name] to discuss above. Reprots they spoke with someone this weekend and decided on [facility]. SW informed him on HHA list left in room, and if they could both review and follow-up with this SW later to make sure she has therapies at time of d/c.  Loralee Pacas, MSW, [place] Office: [phone] Cell: [phone] Fax: [phone]

## 2022-02-17 NOTE — Progress Notes (Signed)
[facility] [facility]) Hospital Liaison note:  This patient has been referred to [facility] Outpatient Palliative Care services. Will continue to follow for disposition.  Please call with any outpatient palliative questions or concerns.  Thank you for the opportunity to participate in this patient's care.   Thank you, Lorelee Market, LPN [facility] Liaison [phone]

## 2022-02-18 ENCOUNTER — Other Ambulatory Visit (HOSPITAL_COMMUNITY): Payer: Self-pay

## 2022-02-18 LAB — COMPREHENSIVE METABOLIC PANEL
ALT: 13 U/L (ref 0–44)
AST: 14 U/L — ABNORMAL LOW (ref 15–41)
Albumin: 3 g/dL — ABNORMAL LOW (ref 3.5–5.0)
Alkaline Phosphatase: 94 U/L (ref 38–126)
Anion gap: 5 (ref 5–15)
BUN: 14 mg/dL (ref 8–23)
CO2: 26 mmol/L (ref 22–32)
Calcium: 9.7 mg/dL (ref 8.9–10.3)
Chloride: 108 mmol/L (ref 98–111)
Creatinine, Ser: 1.12 mg/dL — ABNORMAL HIGH (ref 0.44–1.00)
GFR, Estimated: 49 mL/min — ABNORMAL LOW (ref >60)
Glucose, Bld: 107 mg/dL — ABNORMAL HIGH (ref 70–99)
Potassium: 3.9 mmol/L (ref 3.5–5.1)
Sodium: 139 mmol/L (ref 135–145)
Total Bilirubin: 0.5 mg/dL (ref 0.3–1.2)
Total Protein: 6.3 g/dL — ABNORMAL LOW (ref 6.5–8.1)

## 2022-02-18 LAB — CBC WITH DIFFERENTIAL/PLATELET
Abs Immature Granulocytes: 0.03 10*3/uL (ref 0.00–0.07)
Basophils Absolute: 0.1 10*3/uL (ref 0.0–0.1)
Basophils Relative: 1 %
Eosinophils Absolute: 0.2 10*3/uL (ref 0.0–0.5)
Eosinophils Relative: 2 %
HCT: 36 % (ref 36.0–46.0)
Hemoglobin: 11.5 g/dL — ABNORMAL LOW (ref 12.0–15.0)
Immature Granulocytes: 0 %
Lymphocytes Relative: 38 %
Lymphs Abs: 3.2 10*3/uL (ref 0.7–4.0)
MCH: 28.1 pg (ref 26.0–34.0)
MCHC: 31.9 g/dL (ref 30.0–36.0)
MCV: 88 fL (ref 80.0–100.0)
Monocytes Absolute: 0.8 10*3/uL (ref 0.1–1.0)
Monocytes Relative: 9 %
Neutro Abs: 4.3 10*3/uL (ref 1.7–7.7)
Neutrophils Relative %: 50 %
Platelets: 429 10*3/uL — ABNORMAL HIGH (ref 150–400)
RBC: 4.09 MIL/uL (ref 3.87–5.11)
RDW: 16 % — ABNORMAL HIGH (ref 11.5–15.5)
WBC: 8.6 10*3/uL (ref 4.0–10.5)
nRBC: 0 % (ref 0.0–0.2)

## 2022-02-18 MED ORDER — MELATONIN 3 MG PO TABS: 3.0000 mg | ORAL_TABLET | Freq: Every day | ORAL | 0 refills | Status: DC

## 2022-02-18 MED ORDER — LOSARTAN POTASSIUM 100 MG PO TABS: 100.0000 mg | ORAL_TABLET | Freq: Every day | ORAL | 0 refills | Status: DC

## 2022-02-18 MED ORDER — AMPHETAMINE-DEXTROAMPHETAMINE 30 MG PO TABS: 30.0000 mg | ORAL_TABLET | Freq: Two times a day (BID) | ORAL | 0 refills | Status: DC

## 2022-02-18 MED ORDER — HYDROCHLOROTHIAZIDE 12.5 MG PO TABS: 12.5000 mg | ORAL_TABLET | Freq: Every day | ORAL | 0 refills | Status: DC

## 2022-02-18 MED ORDER — AMLODIPINE BESYLATE 10 MG PO TABS: 10.0000 mg | ORAL_TABLET | Freq: Every day | ORAL | 0 refills | Status: DC

## 2022-02-18 MED ORDER — LEVOTHYROXINE SODIUM 50 MCG PO TABS: 50.0000 ug | ORAL_TABLET | Freq: Every day | ORAL | 0 refills | Status: DC

## 2022-02-18 MED ORDER — POLYETHYLENE GLYCOL 3350 17 G PO PACK: 17.0000 g | PACK | Freq: Every day | ORAL | 0 refills | Status: DC

## 2022-02-18 MED ORDER — RIVAROXABAN 10 MG PO TABS: ORAL_TABLET | ORAL | 0 refills | Status: DC

## 2022-02-18 MED ORDER — BUPROPION HCL ER (XL) 300 MG PO TB24: 300.0000 mg | ORAL_TABLET | Freq: Every day | ORAL | 1 refills | Status: DC

## 2022-02-18 MED ORDER — ESCITALOPRAM OXALATE 10 MG PO TABS: 10.0000 mg | ORAL_TABLET | Freq: Every day | ORAL | 0 refills | Status: DC

## 2022-02-18 MED ORDER — HYDROCODONE-ACETAMINOPHEN 5-325 MG PO TABS: 1.0000 | ORAL_TABLET | ORAL | 0 refills | Status: DC | PRN

## 2022-02-18 NOTE — Plan of Care (Signed)
  Problem: Consults Goal: RH GENERAL PATIENT EDUCATION Description: See Patient Education module for education specifics. Outcome: Progressing Goal: Skin Care Protocol Initiated - if Braden Score 18 or less Description: If consults are not indicated, leave blank or document N/A Outcome: Progressing   Problem: RH BOWEL ELIMINATION Goal: RH STG MANAGE BOWEL WITH ASSISTANCE Description: STG Manage Bowel with Supervision Assistance. Outcome: Progressing Goal: RH STG MANAGE BOWEL W/MEDICATION W/ASSISTANCE Description: STG Manage Bowel with Medication with Supervision Assistance. Outcome: Progressing   Problem: RH BLADDER ELIMINATION Goal: RH STG MANAGE BLADDER WITH ASSISTANCE Description: STG Manage Bladder With Supervision Assistance Outcome: Progressing Goal: RH STG MANAGE BLADDER WITH MEDICATION WITH ASSISTANCE Description: STG Manage Bladder With Medication With Supervision Assistance. Outcome: Progressing   Problem: RH SKIN INTEGRITY Goal: RH STG MAINTAIN SKIN INTEGRITY WITH ASSISTANCE Description: STG Maintain Skin Integrity With Supervision Assistance. Outcome: Progressing Goal: RH STG ABLE TO PERFORM INCISION/WOUND CARE W/ASSISTANCE Description: STG Able To Perform Incision/Wound Care With Supervision Assistance. Outcome: Progressing   Problem: RH SAFETY Goal: RH STG ADHERE TO SAFETY PRECAUTIONS W/ASSISTANCE/DEVICE Description: STG Adhere to Safety Precautions With Cues and Reminders. Outcome: Progressing Goal: RH STG DECREASED RISK OF FALL WITH ASSISTANCE Description: STG Decreased Risk of Fall With Supervision Assistance. Outcome: Progressing   Problem: RH PAIN MANAGEMENT Goal: RH STG PAIN MANAGED AT OR BELOW PT'S PAIN GOAL Description: < 3 on a 0-10 pain scale. Outcome: Progressing   Problem: RH KNOWLEDGE DEFICIT GENERAL Goal: RH STG INCREASE KNOWLEDGE OF SELF CARE AFTER HOSPITALIZATION Description: Patient will demonstrate knowledge of self-care management,  medication/pain management, skin/wound care, weight bearing precautions with educational materials and handouts provided by staff independently at discharge. Outcome: Progressing

## 2022-02-18 NOTE — Progress Notes (Signed)
Occupational Therapy Session Note  Patient Details  Name: [name] MRN: [id number] Date of Birth: 1941-02-17  Today's Date: 02/18/2022 OT Individual Time: 4097-3532 OT Individual Time Calculation (min): 56 min    Short Term Goals: Week 2:  OT Short Term Goal 1 (Week 2): STG=LTG 2/2 ELOS (continue wotking towards overall supervision LTGs)  Skilled Therapeutic Interventions/Progress Updates:    Pt resting in bed upon arrival. Pt already dressed. Pt donned shoes without assistance this morning. Grooming at sink. Hearing aids already in ears. OT intervention with focus on table tasks and standing balance. Pt completed two (2) Lego block structures (moderately difficult) without verbal cues this morning. Standing balance with supervision while pt cleaned all blocks and placed them in container. Continued discharge planning in preparation for discharge home tomorrow. Pt voiced concerns about her memory. Pt returned to room and remained in w/c with all needs within reach. Belt alarm activated.   Therapy Documentation Precautions:  Precautions Precautions: Fall Precaution Comments: HOH Restrictions Weight Bearing Restrictions: Yes RLE Weight Bearing: Weight bearing as tolerated   Pain: Pt denies pain this morning   Therapy/Group: Individual Therapy  Leroy Libman 02/18/2022, 9:01 AM

## 2022-02-18 NOTE — Progress Notes (Signed)
Patient ID: [name], female   DOB: 06-19-1941, 81 y.o.   MRN: [id number]  SW met with pt and called pt husband [name] while in room to provide updates from team conference, d/c date remains 6/7, and discuss discharge/discharge process. SW went through [facility] list with both of them. Preferred HHA is [facility] or [facility]. Would like SW to order DME: RW and 3in1 BSC. HH referral accepted by Cheryl/[facility] for HHPT/OT. SW ordered DME with [facility] via parachute.    Loralee Pacas, MSW, [place] Office: [phone] Cell: [phone] Fax: [phone]

## 2022-02-18 NOTE — Progress Notes (Signed)
Occupational Therapy Discharge Summary  Patient Details  Name: [name] MRN: [id number] Date of Birth: [birth year]  Patient has met 6 of 6 long term goals due to improved activity tolerance, improved balance, postural control, ability to compensate for deficits, improved attention, improved awareness, and improved coordination. Pt made steady progress with BADLs and functional tranfsers. Pt requires min A for LB dressing tasks but completes all other BADLs with supervision. Pt's husband has participated in education sessions and provides the appropriate level of assistance/supervision. Pt and husband verbalized understanding of recommendation for 24 hour supervision.  Patient to discharge at overall Supervision level.  Patient's care partner is independent to provide the necessary physical and cognitive assistance at discharge.    Reasons goals not met: n/a  Recommendation:  Patient will benefit from ongoing skilled OT services in home health setting to continue to advance functional skills in the area of BADL and iADL.  Equipment: No equipment provided  Reasons for discharge: treatment goals met and discharge from hospital  Patient/family agrees with progress made and goals achieved: Yes  OT Discharge ADL ADL Equipment Provided: Reacher, Sock aid, Long-handled sponge Eating: Independent Where Assessed-Eating: Wheelchair Grooming: Independent Where Assessed-Grooming: Sitting at sink Upper Body Bathing: Supervision/safety Where Assessed-Upper Body Bathing: Sitting at sink Lower Body Bathing: Supervision/safety Where Assessed-Lower Body Bathing: Sitting at sink Upper Body Dressing: Supervision/safety Where Assessed-Upper Body Dressing: Sitting at sink Lower Body Dressing: Supervision/safety Where Assessed-Lower Body Dressing: Sitting at sink Toileting: Supervision/safety Where Assessed-Toileting: Bedside Commode, Toilet Toilet Transfer: Close supervision Toilet Transfer  Method: Counselling psychologist: Grab bars, Bedside commode Tub/Shower Transfer: Close supervison Clinical cytogeneticist Method: Librarian, academic: Gaffer Baseline Vision/History: 1 Wears glasses Patient Visual Report: No change from baseline Vision Assessment?: No apparent visual deficits Perception  Perception: Within Functional Limits Praxis Praxis: Intact Cognition Cognition Overall Cognitive Status: Impaired/Different from baseline Arousal/Alertness: Awake/alert Orientation Level: Place;Situation;Person Person: Oriented Place: Oriented Situation: Oriented Memory: Impaired Memory Impairment: Decreased recall of new information Attention: Sustained Sustained Attention: Appears intact Awareness: Appears intact Problem Solving: Appears intact Safety/Judgment: Appears intact Brief Interview for Mental Status (BIMS) Repetition of Three Words (First Attempt): 3 Temporal Orientation: Year: Correct Temporal Orientation: Month: Accurate within 5 days Temporal Orientation: Day: Incorrect Recall: "Sock": Yes, no cue required Recall: "Blue": Yes, no cue required Recall: "Bed": Yes, no cue required BIMS Summary Score: 14 Sensation Sensation Light Touch: Appears Intact Hot/Cold: Appears Intact Proprioception: Appears Intact Stereognosis: Appears Intact Motor  Motor Motor - Skilled Clinical Observations: Generalized weakness and deconditioning 2/2 hip fx Trunk/Postural Assessment  Cervical Assessment Cervical Assessment: Exceptions to [facility] (forward head) Thoracic Assessment Thoracic Assessment: Exceptions to [facility] (rounded shoulders) Lumbar Assessment Lumbar Assessment: Exceptions to [facility] (posterior pelvic tilt) Postural Control Postural Control: Deficits on evaluation Righting Reactions: delayed  Balance Static Sitting Balance Static Sitting - Level of Assistance: 6: Modified independent (Device/Increase time) Dynamic Sitting  Balance Dynamic Sitting - Balance Support: During functional activity Dynamic Sitting - Level of Assistance: 5: Stand by assistance Extremity/Trunk Assessment RUE Assessment RUE Assessment: Exceptions to [facility] Passive Range of Motion (PROM) Comments: WNL Active Range of Motion (AROM) Comments: ~90* shoulder flexion and abduction 2/2 possible RTC tear General Strength Comments: WNL LUE Assessment LUE Assessment: Within Functional Limits   Leroy Libman 02/18/2022, 8:36 AM

## 2022-02-18 NOTE — Patient Care Conference (Signed)
Inpatient RehabilitationTeam Conference and Plan of Care Update Date: 02/18/2022   Time: 11:12 AM    Patient Name: [name]      Medical Record Number: [id number]  Date of Birth: August 21, 1941 Sex: Female         Room/Bed: 4W23C/4W23C-01 Payor Info: Payor: MEDICARE / Plan: MEDICARE PART A AND B / Product Type: *No Product type* /    Admit Date/Time:  02/04/2022 12:24 PM  Primary Diagnosis:  Closed subcapital fracture of neck of right femur [facility])  Hospital Problems: Principal Problem:   Closed subcapital fracture of neck of right femur [facility])    Expected Discharge Date: Expected Discharge Date: 02/19/22  Team Members Present: Physician leading conference: Dr. Courtney Heys Social Worker Present: Loralee Pacas, [place] Nurse Present: Dorthula Nettles, RN PT Present: Excell Seltzer, PT OT Present: Willeen Cass, OT PPS Coordinator present : Ileana Ladd, PT     Current Status/Progress Goal Weekly Team Focus  Bowel/Bladder   continent b/b  remain continent  toilet as needed   Swallow/Nutrition/ Hydration             ADL's   bathing-CGA; LB dressing-min A; transfers-CGA; toileting-CGA  supervision overall, min A LB dressing  safety awareness, functional tranfsers, BADL retraining, education   Mobility   Supervision bed mobility, CGA transfers with RW, CGA gait with RW 150 ft, one curb step with RW min A  Supervision overall, CGA car transfer and stairs  d/c planning   Communication             Safety/Cognition/ Behavioral Observations            Pain   reports pain controlled  < 3  assess pain q 4hr and prn   Skin   right hip incision covered with foam  new breakdown  assess skin q shift and prn     Discharge Planning:  pt will d/c to home with her husband to provide 24/7 care and PRN support from various family members. HHA pending prefernece.   Team Discussion: Memory poor. K+ improved. Megace added last week, appetite improved. Continent B/B, pain is  controlled, foam to hip incision. HH in place, will order equipment. Family education complete.  Patient on target to meet rehab goals: yes, supervision goals. At goal level.  *See Care Plan and progress notes for long and short-term goals.   Revisions to Treatment Plan:  Finalizing discharge plans   Teaching Needs: Family education complete   Current Barriers to Discharge: No current barriers  Possible Resolutions to Barriers: All barriers addressed     Medical Summary Current Status: LBM yesterday with lactulose- pain controlled- with 1x/day pain meds- skin OK- memory deficits- chronic  Barriers to Discharge: Home enviroment access/layout  Barriers to Discharge Comments: needs RW and BSC for d/c tomorrow Possible Resolutions to [employer] Focus: at goal level with therapies- Supervision- d/c tomorrow- family educatoin done and ready for d/c.   Continued Need for Acute Rehabilitation Level of Care: The patient requires daily medical management by a physician with specialized training in physical medicine and rehabilitation for the following reasons: Direction of a multidisciplinary physical rehabilitation program to maximize functional independence : Yes Medical management of patient stability for increased activity during participation in an intensive rehabilitation regime.: Yes Analysis of laboratory values and/or radiology reports with any subsequent need for medication adjustment and/or medical intervention. : Yes   I attest that I was present, lead the team conference, and concur with the assessment and plan  of the team.   Cristi Loron 02/18/2022, 4:33 PM

## 2022-02-18 NOTE — Progress Notes (Signed)
PROGRESS NOTE   Subjective/Complaints:  Had BM yesterday- pt didn't remember that she did- No issues otherwise.    ROS:  Pt denies SOB, abd pain, CP, N/V/C/D, and vision changes  Objective:   No results found.  Recent Labs    02/18/22 0536  WBC 8.6  HGB 11.5*  HCT 36.0  PLT 429*     Recent Labs    02/18/22 0536  NA 139  K 3.9  CL 108  CO2 26  GLUCOSE 107*  BUN 14  CREATININE 1.12*  CALCIUM 9.7      Intake/Output Summary (Last 24 hours) at 02/18/2022 0853 Last data filed at 02/18/2022 0725 Gross per 24 hour  Intake 889 ml  Output --  Net 889 ml        Physical Exam: Vital Signs Blood pressure 123/63, pulse 72, temperature 98.5 F (36.9 C), resp. rate 14, height '5\' 4"'$  (1.626 m), weight 75.8 kg, SpO2 94 %.     General: awake, alert, appropriate,  sitting up in bed; NT taking to bathroom; NAD HENT: conjugate gaze; oropharynx moist CV: regular rate; no JVD Pulmonary: CTA B/L; no W/R/R- good air movement GI: soft, NT, ND, (+)BS Psychiatric: appropriate Neurological: poor memory this AM  Musculoskeletal:     Cervical back: Neck supple. No tenderness.     Comments: LUE 5/5 RUE- deltoid 2-/5- can only lift arm to 20-30 degrees abduction of flexion Otherwise 5-/5 in RUE- cannot participate in empty can test- obvious RTC tear- no change LLE- 5/5 RLE- HF 2-/5; otherwise 5/5 in RLE  Skin: R hip has dressing in place-mild edema now- no erythema- less TTP  Neurological:     General: No focal deficit present.     Mental Status: She is alert and oriented to person, place, and time.     Comments: Patient is alert.  No acute distress.   Assessment/Plan: 1. Functional deficits which require 3+ hours per day of interdisciplinary therapy in a comprehensive inpatient rehab setting. Physiatrist is providing close team supervision and 24 hour management of active medical problems listed  below. Physiatrist and rehab team continue to assess barriers to discharge/monitor patient progress toward functional and medical goals  Care Tool:  Bathing    Body parts bathed by patient: Right arm, Left arm, Chest, Abdomen, Front perineal area, Buttocks, Right upper leg, Face, Left upper leg, Left lower leg, Right lower leg   Body parts bathed by helper: Right lower leg, Left lower leg     Bathing assist Assist Level: Supervision/Verbal cueing     Upper Body Dressing/Undressing Upper body dressing   What is the patient wearing?: Pull over shirt    Upper body assist Assist Level: Set up assist    Lower Body Dressing/Undressing Lower body dressing      What is the patient wearing?: Pants     Lower body assist Assist for lower body dressing: Supervision/Verbal cueing     Toileting Toileting    Toileting assist Assist for toileting: Supervision/Verbal cueing     Transfers Chair/bed transfer  Transfers assist  Chair/bed transfer activity did not occur: Safety/medical concerns  Chair/bed transfer assist level: Contact Guard/Touching assist  Locomotion Ambulation   Ambulation assist      Assist level: Contact Guard/Touching assist Assistive device: Walker-rolling Max distance: 150'   Walk 10 feet activity   Assist  Walk 10 feet activity did not occur: Safety/medical concerns  Assist level: Contact Guard/Touching assist Assistive device: Walker-rolling   Walk 50 feet activity   Assist Walk 50 feet with 2 turns activity did not occur: Safety/medical concerns  Assist level: Contact Guard/Touching assist Assistive device: Walker-rolling    Walk 150 feet activity   Assist Walk 150 feet activity did not occur: Safety/medical concerns  Assist level: Contact Guard/Touching assist Assistive device: Walker-rolling    Walk 10 feet on uneven surface  activity   Assist Walk 10 feet on uneven surfaces activity did not occur: Safety/medical  concerns         Wheelchair     Assist Is the patient using a wheelchair?: Yes Type of Wheelchair: Manual    Wheelchair assist level: Supervision/Verbal cueing Max wheelchair distance: 7f    Wheelchair 50 feet with 2 turns activity    Assist        Assist Level: Moderate Assistance - Patient 50 - 74% (per PT documentation of 35 feet supervision)   Wheelchair 150 feet activity     Assist      Assist Level: Total Assistance - Patient < 25% (per PT documentation of 35 feet supervision)   Blood pressure 123/63, pulse 72, temperature 98.5 F (36.9 C), resp. rate 14, height '5\' 4"'$  (1.626 m), weight 75.8 kg, SpO2 94 %.  Medical Problem List and Plan: 1. Functional deficits secondary to impacted subcapital right hip fracture after fall.  Status post ORIF 02/02/2022.  Weightbearing as tolerated.             -patient may  shower- with Aquacel dressing in place             -ELOS/Goals: 10-14 days Supervision to mod I  D/c 6/7  Con't CIR- PT and OT- team conference today to finalize d/c.  2.  Antithrombotics: -DVT/anticoagulation:  Pharmaceutical: Lovenox x6 weeks versus transition to Xarelto at discharge.  [place]oreareviewed and shows no clots.              -antiplatelet therapy: N/A 3. Pain: continue Hydrocodone as needed- encouraged pt to take for therapy.   6/1- taking ~ 1x/day Norco- con't regimen- 6/5- taking Norco occ- ~1x/day- con't regimen  4. Depression: continue Wellbutrin 300 mg daily, Adderall 30 mg twice daily, melatonin 3 mg nightly  5/25- pt asking to increase meds- will add Lexapro 5 mg daily instead of increasing wellbutrin since on Wellbutrin, Adderall and Rocephin, can lower seizure threshold. No SSRI's in allergy list.    5/26- went over changes with pt- also pt seeing neuropsych today- went over pt issues with them.   5/31- increased lexapro to 10 mg daily per d/w team- still depressed.   6/2- will see if Neruopsych can see again=- pt still depressed-  explained it takes a few days for meds to hopefully take effect.   6/5- says feeling brighter this AM- con't regimen             -antipsychotic agents: N/A 5. Neuropsych: This patient is capable of making decisions on her own behalf. 6. Skin/Wound Care: Routine skin checks 7. Fluids/Electrolytes/Nutrition: Routine in and out with follow-up chemistries 8.  Leukocytosis.  Empiric Rocephin.    5/25- U/A was large leuks and (+) nitrites- even though Urine Cx (-)- since  had large leuks and nitrites, will con't Rocephin for 5 days total.   5/28- IV ABX done- can remove IV 9.  Hypertension.  continue Norvasc 10 mg daily, Cozaar 100 mg daily.  Monitor with increased mobility  6/6- BP controlled- con't regimen 10.  Hypothyroidism.  Synthroid 11.  Obesity.  BMI 28.23.  Dietary follow-up 12. R Rotator cuff tear- non surgical - pt didn't want surgery right now- pain control for now 13. Constipation- will give Sorbitol now for BM tonight hopefully.    5/25- give MOM- see if can get results- if none by tomorrow, will check KUB  5/26- will order Sorbitol 60cc after therapy- but also ordered KUB to make sure no obstruction/ileus, since had surgery and no BM since surgery.   5/27- KUB nonobstructive- will give lactulose 30G and follow with soap suds enema. 5/28- very large BM with lactulose  5/29 Reports she feels better after BM yesterda 5/30- LBM Saturday night- will give Sorbitol 45 cc after therapy and increased Senna to 2 tabs BID 6/5- Will give Lactulose 20G after therapy since doesn't respond as well to Sorbitol-- suggested to nurse to use anti-nausea medicine to get her to keep it down- had issues with that as well  6/6- large continent BM yesterday with lactulose- will send home with Rx for lactulose prn  14. Insomnia  5/27- will order Trazodone 25-50 mg QHS prn for sleep  5/29 Pt using PRN Trazodone for sleep, continue current regimen 15. Hypokalemia  5/31- K+ 3.0- will give 40 MEq x3 and recheck  Thursday  6/1- K+ up to 3.3 with 2 doses- will give 1 more dose and already had last dose scheduled/given this Am after labs- K+ up to 3.3- will recheck in AM  6/5- will recheck in AM   16. Poor appetite- continue Megace to help- likely due to depressed mood.   6/5- no change, but just started Megace last week 17. Poor memory  6/6- have seen intermittently- but will double check with staff and see what they've seen   I spent a total of 36   minutes on total care today- >50% coordination of care- due to team conference to finalize d/c and d/w team about pt's memory   LOS: 14 days A FACE TO FACE EVALUATION WAS PERFORMED  Trevaris Pennella 02/18/2022, 8:53 AM

## 2022-02-18 NOTE — Progress Notes (Signed)
Occupational Therapy Session Note  Patient Details  Name: Samirah J Melikian MRN: [id number] Date of Birth: 07/24/1941  Today's Date: 02/18/2022 OT Individual Time: 1400-1500 OT Individual Time Calculation (min): 60 min    Short Term Goals: Week 2:  OT Short Term Goal 1 (Week 2): STG=LTG 2/2 ELOS (continue wotking towards overall supervision LTGs)  Skilled Therapeutic Interventions/Progress Updates:    Pt received supine in bed with no reports of pain and agreeable to OT tx. Pt completed bed mobility (S) with some complaints of discomfort. Completed sit > stand with (S) and cueing for hand placement. Completed functional mobility with rw to shower (S). Asked for assistance to doff tennis shoes. Education provided on technique for pulling shirt overhead first to decrease pain from rotator cuff injury. Pt able to return demonstrate technique. Pt completed shower seated on tub bench with (S). Donned clothing seated on tub bench to challenge balance. Pt able to don UB/LB clothing (S) overall. However, requested assistance to don socks. Pt visibly and verbally expressing discomfort when lifting RLE into figure 4 position. Pt completed oral/hair hygiene seated at the sink counter with set-up and assistance to blow dry. Pt left supine in bed with bed alarm on, call bell in reach, and all needs met.  Therapy Documentation Precautions:  Precautions Precautions: Fall Precaution Comments: HOH Restrictions Weight Bearing Restrictions: Yes RLE Weight Bearing: Weight bearing as tolerated  Therapy/Group: Individual Therapy    02/18/2022, 12:34 PM 

## 2022-02-18 NOTE — Progress Notes (Incomplete)
Inpatient Rehabilitation Discharge Medication Review by a Pharmacist  A complete drug regimen review was completed for this patient to identify any potential clinically significant medication issues.  High Risk Drug Classes Is patient taking? Indication by Medication  Antipsychotic {Receiving?:26196}   Anticoagulant {Receiving?:26196} Rivaroxaban: VTE ppx x6 weeks  Antibiotic {Receiving?:26196}   Opioid {Receiving?:26196}   Antiplatelet {Receiving?:26196}   Hypoglycemics/insulin {Yes or No?:26198}   Vasoactive Medication {Receiving?:26196} Amlodipine, losartan: hypertension   Chemotherapy {Receiving Chemo?:26197}   Other {Yes or No?:26198} Adderall: attention Bupropion: mood Diclofenac: topical pain relief Docustate, Senna, Miralax: constipation Escitalopram: mood Levothyroxine: hypothyroid tx Melatonin: PRN sleep Megestrol: appetite stimulant     Type of Medication Issue Identified Description of Issue Recommendation(s)  Drug Interaction(s) (clinically significant)     Duplicate Therapy     Allergy     No Medication Administration End Date     Incorrect Dose     Additional Drug Therapy Needed     Significant med changes from prior encounter (inform family/care partners about these prior to discharge). Discontinued medication: - hydrochlorothiazide, excedrin OTC   Other       Clinically significant medication issues were identified that warrant physician communication and completion of prescribed/recommended actions by midnight of the next day:  {Yes or No?:26198}  Name of provider notified for urgent issues identified: ***   Provider Method of Notification: ***    Pharmacist comments: ***   Time spent performing this drug regimen review (minutes): 20   Thank you for allowing pharmacy to be a part of this patient's care.  Ardyth Harps, PharmD Clinical Pharmacist

## 2022-02-18 NOTE — Progress Notes (Signed)
Physical Therapy Discharge Summary  Patient Details  Name: [name] MRN: [id number] Date of Birth: [birth year]  Today's Date: 02/18/2022 PT Individual Time: 0950-1100 PT Minutes: 70 min     Patient has met 8 of 9 long term goals due to improved activity tolerance, improved balance, improved postural control, increased strength, decreased pain, and ability to compensate for deficits.  Patient to discharge at an ambulatory level Supervision.   Patient's care partner is independent to provide the necessary physical and cognitive assistance at discharge. Pt's husband [name] has completed hands-on family education and is safe to assist her upon d/c home.  Reasons goals not met: Pt did not meet stair goal of 4 stairs at [facility] level as per her home setup she has one curb step therefore therapy sessions focused on practicing pt's actual home setup and functional environment. Pt is at [facility] level to navigate her curb step at home with RW.  Recommendation:  Patient will benefit from ongoing skilled PT services in home health setting to continue to advance safe functional mobility, address ongoing impairments in endurance, strength, safety, balance, independence with functional mobility, and minimize fall risk.  Equipment: RW  Reasons for discharge: treatment goals met and discharge from hospital  Patient/family agrees with progress made and goals achieved: Yes  Skilled Intervention: Pt received seated in w/c in room, agreeable to PT session. No complaints of pain. Sit to stand with Supervision and RW throughout session. Ambulation up to 150 ft with RW at Supervision level. Car transfer with RW and CGA for balance, cues for safe transfer technique. Ambulation up/down ramp and across uneven surface with RW and CGA for balance. Ascend/descend 4 x 6" stairs with 1-2 handrails with forward ascent and lateral descent with mod A needed due to LE weakness and fear of falling. Ascend/descend one curb step  with RW backwards with CGA to simulate home setup. Pt able to retrieve object from the floor with use of RW and reacher at Supervision level. Nustep level 3 x 6 min with use of BLE only for global strengthening. Pt returned to bed at end of session at mod I level. Pt left seated in bed with needs in reach, bed alarm in place.  PT Discharge Precautions/Restrictions Precautions Precautions: Fall Precaution Comments: HOH Restrictions Weight Bearing Restrictions: Yes RLE Weight Bearing: Weight bearing as tolerated Pain Interference Pain Interference Pain Effect on Sleep: 1. Rarely or not at all Pain Interference with Therapy Activities: 2. Occasionally Pain Interference with Day-to-Day Activities: 2. Occasionally Vision/Perception  Perception Perception: Within Functional Limits Praxis Praxis: Intact  Cognition Overall Cognitive Status: Impaired/Different from baseline Arousal/Alertness: Awake/alert Orientation Level: Oriented X4 Year: 2023 Attention: Focused;Sustained Focused Attention: Appears intact Sustained Attention: Appears intact Memory: Impaired Memory Impairment: Decreased recall of new information Awareness: Appears intact Problem Solving: Appears intact Safety/Judgment: Appears intact Sensation Sensation Light Touch: Appears Intact Hot/Cold: Appears Intact Proprioception: Appears Intact Coordination Gross Motor Movements are Fluid and Coordinated: No Fine Motor Movements are Fluid and Coordinated: Yes Coordination and Movement Description: limited by ongoing pain Heel Shin Test: not tested 2/2 hip pain Motor  Motor Motor: Other (comment) Motor - Discharge Observations: generalized weakness and deconditioning 2/2 hip fx  Mobility Bed Mobility Bed Mobility: Rolling Right;Rolling Left;Supine to Sit;Sit to Supine Rolling Right: Independent Rolling Left: Independent Supine to Sit: Independent with assistive device Sit to Supine: Independent with assistive  device Transfers Transfers: Sit to Stand;Stand Pivot Transfers Sit to Stand: Supervision/Verbal cueing Stand to Sit: Supervision/Verbal cueing Stand  Pivot Transfers: Supervision/Verbal cueing Stand Pivot Transfer Details: Verbal cues for sequencing;Verbal cues for technique;Verbal cues for precautions/safety;Verbal cues for safe use of DME/AE Transfer (Assistive device): Rolling walker Locomotion  Gait Ambulation: Yes Gait Assistance: Supervision/Verbal cueing Gait Distance (Feet): 150 Feet Assistive device: Rolling walker Gait Assistance Details: Verbal cues for sequencing;Verbal cues for technique;Verbal cues for precautions/safety;Verbal cues for gait pattern;Verbal cues for safe use of DME/AE Gait Gait: Yes Gait Pattern: Impaired Gait Pattern: Step-to pattern;Antalgic;Trunk flexed;Lateral trunk lean to right Gait velocity: decreased Stairs / Additional Locomotion Stairs: Yes Stairs Assistance: Moderate Assistance - Patient 50 - 74% Stair Management Technique: Two rails;Step to pattern;Sideways;Forwards Number of Stairs: 4 Height of Stairs: 6 Ramp: Contact Guard/touching assist Curb: Contact Guard/Touching assist Wheelchair Mobility Wheelchair Mobility: No  Trunk/Postural Assessment  Cervical Assessment Cervical Assessment: Exceptions to [name] (forward head) Thoracic Assessment Thoracic Assessment: Exceptions to [facility] (rounded shoulders) Lumbar Assessment Lumbar Assessment: Exceptions to [facility] (posterior pelvic tilt) Postural Control Postural Control: Deficits on evaluation Righting Reactions: delayed  Balance Balance Balance Assessed: Yes Static Sitting Balance Static Sitting - Balance Support: Feet supported;No upper extremity supported Static Sitting - Level of Assistance: 6: Modified independent (Device/Increase time) Dynamic Sitting Balance Dynamic Sitting - Balance Support: No upper extremity supported;Feet supported;During functional activity Dynamic Sitting -  Level of Assistance: 5: Stand by assistance Static Standing Balance Static Standing - Balance Support: Bilateral upper extremity supported;During functional activity Static Standing - Level of Assistance: 5: Stand by assistance Dynamic Standing Balance Dynamic Standing - Balance Support: Bilateral upper extremity supported;During functional activity Dynamic Standing - Level of Assistance: 5: Stand by assistance Extremity Assessment   RLE Assessment RLE Assessment: Exceptions to [facility] General Strength Comments: limited due to hip pain RLE Strength Right Hip Flexion: 3-/5 Right Knee Flexion: 4/5 Right Knee Extension: 4/5 Right Ankle Dorsiflexion: 4/5 LLE Assessment LLE Assessment: Within Functional Limits General Strength Comments: 5/5 grossly     Excell Seltzer, PT, DPT, CSRS 02/18/2022, 11:28 AM

## 2022-02-19 NOTE — Progress Notes (Signed)
Inpatient Rehabilitation Discharge Medication Review by a Pharmacist  A complete drug regimen review was completed for this patient to identify any potential clinically significant medication issues.  High Risk Drug Classes Is patient taking? Indication by Medication  Antipsychotic No   Anticoagulant Yes Rivaroxaban: VTE ppx x6 weeks  Antibiotic No   Opioid Yes Norco: PRN pain  Antiplatelet No   Hypoglycemics/insulin No   Vasoactive Medication Yes Amlodipine, losartan, hydrochlorothiazide: hypertension   Chemotherapy No   Other Yes Adderall: attention/alertness Bupropion: mood Diclofenac: topical pain relief Docustate, Senna: constipation Escitalopram: mood Levothyroxine: hypothyroid tx Melatonin: PRN sleep Megestrol: appetite stimulant     Type of Medication Issue Identified Description of Issue Recommendation(s)  Drug Interaction(s) (clinically significant)     Duplicate Therapy     Allergy     No Medication Administration End Date     Incorrect Dose     Additional Drug Therapy Needed     Significant med changes from prior encounter (inform family/care partners about these prior to discharge). Discontinued medication: - hydrochlorothiazide, excedrin OTC   Other       Clinically significant medication issues were identified that warrant physician communication and completion of prescribed/recommended actions by midnight of the next day:  No   Pharmacist comments: n/a   Time spent performing this drug regimen review (minutes): 20   Thank you for allowing pharmacy to be a part of this patient's care.  Ardyth Harps, PharmD Clinical Pharmacist

## 2022-02-19 NOTE — Progress Notes (Signed)
Patient discharged to home, acompanied by her spouse.

## 2022-02-19 NOTE — Progress Notes (Signed)
Inpatient Rehabilitation Care Coordinator Discharge Note   Patient Details  Name: [name] MRN: [id number] Date of Birth: 02-12-1941   Discharge location: D/c to home with support from husband  Length of Stay: 14 days  Discharge activity level: Supervision  Home/community participation: Limited  Patient response JT:TSVXBL Literacy - How often do you need to have someone help you when you read instructions, pamphlets, or other written material from your doctor or pharmacy?: Never  Patient response TJ:[id number] Isolation - How often do you feel lonely or isolated from those around you?: Never  Services provided included: MD, RD, PT, OT, SLP, RN, CM, TR, Pharmacy, Neuropsych, SW  Financial Services:  Charity fundraiser Utilized: Medicare    Choices offered to/list presented to: Yes  Follow-up services arranged:  Home Health, DME Home Health Agency: [facility] for HHPT/OT    DME : [place] for RW and [facility]in[facility] BSC    Patient response to transportation need: Is the patient able to respond to transportation needs?: Yes In the past 12 months, has lack of transportation kept you from medical appointments or from getting medications?: No In the past 12 months, has lack of transportation kept you from meetings, work, or from getting things needed for daily living?: No  Comments (or additional information):  Patient/Family verbalized understanding of follow-up arrangements:  Yes  Individual responsible for coordination of the follow-up plan: contact pt husband [name]  Confirmed correct DME delivered: Rana Snare 02/19/2022    Rana Snare

## 2022-02-20 DIAGNOSIS — M75101 Unspecified rotator cuff tear or rupture of right shoulder, not specified as traumatic: Secondary | ICD-10-CM | POA: Diagnosis not present

## 2022-02-20 DIAGNOSIS — Z7951 Long term (current) use of inhaled steroids: Secondary | ICD-10-CM | POA: Diagnosis not present

## 2022-02-20 DIAGNOSIS — E039 Hypothyroidism, unspecified: Secondary | ICD-10-CM | POA: Diagnosis not present

## 2022-02-20 DIAGNOSIS — S72011D Unspecified intracapsular fracture of right femur, subsequent encounter for closed fracture with routine healing: Secondary | ICD-10-CM | POA: Diagnosis not present

## 2022-02-20 DIAGNOSIS — Z96612 Presence of left artificial shoulder joint: Secondary | ICD-10-CM | POA: Diagnosis not present

## 2022-02-20 DIAGNOSIS — K5900 Constipation, unspecified: Secondary | ICD-10-CM | POA: Diagnosis not present

## 2022-02-20 DIAGNOSIS — Z9181 History of falling: Secondary | ICD-10-CM | POA: Diagnosis not present

## 2022-02-20 DIAGNOSIS — J45909 Unspecified asthma, uncomplicated: Secondary | ICD-10-CM | POA: Diagnosis not present

## 2022-02-20 DIAGNOSIS — Z9842 Cataract extraction status, left eye: Secondary | ICD-10-CM | POA: Diagnosis not present

## 2022-02-20 DIAGNOSIS — Z6828 Body mass index (BMI) 28.0-28.9, adult: Secondary | ICD-10-CM | POA: Diagnosis not present

## 2022-02-20 DIAGNOSIS — F32A Depression, unspecified: Secondary | ICD-10-CM | POA: Diagnosis not present

## 2022-02-20 DIAGNOSIS — I10 Essential (primary) hypertension: Secondary | ICD-10-CM | POA: Diagnosis not present

## 2022-02-20 DIAGNOSIS — M8580 Other specified disorders of bone density and structure, unspecified site: Secondary | ICD-10-CM | POA: Diagnosis not present

## 2022-02-20 DIAGNOSIS — J309 Allergic rhinitis, unspecified: Secondary | ICD-10-CM | POA: Diagnosis not present

## 2022-02-20 DIAGNOSIS — E669 Obesity, unspecified: Secondary | ICD-10-CM | POA: Diagnosis not present

## 2022-02-20 DIAGNOSIS — Z87440 Personal history of urinary (tract) infections: Secondary | ICD-10-CM | POA: Diagnosis not present

## 2022-02-20 DIAGNOSIS — M19011 Primary osteoarthritis, right shoulder: Secondary | ICD-10-CM | POA: Diagnosis not present

## 2022-02-20 DIAGNOSIS — Z9841 Cataract extraction status, right eye: Secondary | ICD-10-CM | POA: Diagnosis not present

## 2022-02-20 DIAGNOSIS — F419 Anxiety disorder, unspecified: Secondary | ICD-10-CM | POA: Diagnosis not present

## 2022-02-20 DIAGNOSIS — Z87891 Personal history of nicotine dependence: Secondary | ICD-10-CM | POA: Diagnosis not present

## 2022-02-20 DIAGNOSIS — K219 Gastro-esophageal reflux disease without esophagitis: Secondary | ICD-10-CM | POA: Diagnosis not present

## 2022-02-20 DIAGNOSIS — E785 Hyperlipidemia, unspecified: Secondary | ICD-10-CM | POA: Diagnosis not present

## 2022-02-21 ENCOUNTER — Telehealth: Payer: Self-pay | Admitting: Nurse Practitioner

## 2022-02-21 NOTE — Telephone Encounter (Signed)
Attempted to contact patient to schedule Palliative Consult with no answer - left message with reason for call along with my name and call back number.    I then called and spoke with patient's husband [name] and after discussing Palliative services with him he was in agreement with scheduling but he was in the middle of something and requested that I call him back on Monday to schedule the visit.  Husband requested to be primary contact because patient doesn't always answer her calls.

## 2022-02-24 ENCOUNTER — Telehealth: Payer: Self-pay | Admitting: Nurse Practitioner

## 2022-02-24 NOTE — Telephone Encounter (Signed)
Spoke with patient's husband [name] regarding the Palliative referral/services and all questions were answered and he was in agreement with beginning services with [place].  I have scheduled a Telehealth Palliative Consult forl 02/27/22 @ 2 PM

## 2022-02-25 DIAGNOSIS — F32A Depression, unspecified: Secondary | ICD-10-CM | POA: Diagnosis not present

## 2022-02-25 DIAGNOSIS — S72011D Unspecified intracapsular fracture of right femur, subsequent encounter for closed fracture with routine healing: Secondary | ICD-10-CM | POA: Diagnosis not present

## 2022-02-25 DIAGNOSIS — M19011 Primary osteoarthritis, right shoulder: Secondary | ICD-10-CM | POA: Diagnosis not present

## 2022-02-25 DIAGNOSIS — F419 Anxiety disorder, unspecified: Secondary | ICD-10-CM | POA: Diagnosis not present

## 2022-02-25 DIAGNOSIS — I10 Essential (primary) hypertension: Secondary | ICD-10-CM | POA: Diagnosis not present

## 2022-02-25 DIAGNOSIS — J45909 Unspecified asthma, uncomplicated: Secondary | ICD-10-CM | POA: Diagnosis not present

## 2022-02-26 DIAGNOSIS — S72011D Unspecified intracapsular fracture of right femur, subsequent encounter for closed fracture with routine healing: Secondary | ICD-10-CM | POA: Diagnosis not present

## 2022-02-26 DIAGNOSIS — J45909 Unspecified asthma, uncomplicated: Secondary | ICD-10-CM | POA: Diagnosis not present

## 2022-02-26 DIAGNOSIS — F419 Anxiety disorder, unspecified: Secondary | ICD-10-CM | POA: Diagnosis not present

## 2022-02-26 DIAGNOSIS — F32A Depression, unspecified: Secondary | ICD-10-CM | POA: Diagnosis not present

## 2022-02-26 DIAGNOSIS — I10 Essential (primary) hypertension: Secondary | ICD-10-CM | POA: Diagnosis not present

## 2022-02-26 DIAGNOSIS — M19011 Primary osteoarthritis, right shoulder: Secondary | ICD-10-CM | POA: Diagnosis not present

## 2022-02-26 DIAGNOSIS — Z9889 Other specified postprocedural states: Secondary | ICD-10-CM | POA: Diagnosis not present

## 2022-02-27 ENCOUNTER — Encounter: Payer: Self-pay | Admitting: Nurse Practitioner

## 2022-02-27 ENCOUNTER — Other Ambulatory Visit: Payer: Medicare Other | Admitting: Nurse Practitioner

## 2022-02-27 DIAGNOSIS — R52 Pain, unspecified: Secondary | ICD-10-CM | POA: Diagnosis not present

## 2022-02-27 DIAGNOSIS — Z515 Encounter for palliative care: Secondary | ICD-10-CM

## 2022-02-27 NOTE — Progress Notes (Signed)
[facility] Consult Note Telephone: [phone]  Fax: [phone]   Date of encounter: 02/27/22 4:19 PM PATIENT NAME: [name] [address] [place] [address]   [phone] (home)  DOB: [birth year] MRN: [id number] PRIMARY CARE PROVIDER:    Celene Squibb, MD,  [place] [address] [phone]  REFERRING PROVIDER:   Celene Squibb, MD [place],  [place] [address] [phone]  RESPONSIBLE PARTY:    Contact Information     Name Relation Home Work Mobile   [place] Spouse   [phone]   [place], [place]   [phone]      Due to the COVID-19 crisis, this visit was done via telemedicine from my office and it was initiated and consent by this patient and or family.  I connected with [name] with [name] on 02/27/22 by telephone as video not available enabled telemedicine application and verified that I am speaking with the correct person using two identifiers.   I discussed the limitations of evaluation and management by telemedicine. The patient expressed understanding and agreed to proceed. Palliative Care was asked to follow this patient by consultation request of  Pam Squibb, MD to address advance care planning and complex medical decision making. This is the initial visit.                       ASSESSMENT AND PLAN / RECOMMENDATIONS:  Symptom Management/Plan: 1. Advance Care Planning;  DNR  2. Goals of Care: Goals include to maximize quality of life and symptom management. Our advance care planning conversation included a discussion about:    The value and importance of advance care planning  Exploration of personal, cultural or spiritual beliefs that might influence medical decisions  Exploration of goals of care in the event of a sudden injury or illness  Identification and preparation of a healthcare agent  Review and updating or creation of  an advance directive document.  3. Pain with ambulation following right hip fracture. [name] endorses no pain except when ambulating. PT just started, discussed will continue with PT, continue to use hydrocodone 5/325 mg as needed for moderate to severe pain. [name] has not required to use hydrocodone only minimally. Monitor efficacy vs adverse side effects  4. Palliative care encounter; Palliative care encounter; Palliative medicine team will continue to support patient, patient's family, and medical team. Visit consisted of counseling and education dealing with the complex and emotionally intense issues of symptom management and palliative care in the setting of serious and potentially life-threatening illness Follow up Palliative Care Visit: Palliative care will continue to follow for complex medical decision making, advance care planning, and clarification of goals. Return 8 weeks or prn.  I spent 34 minutes providing this consultation. More than 50% of the time in this consultation was spent in counseling and care coordination. PPS: 50% Chief Complaint: Initial palliative consult for complex medical decision making, address goals, manage ongoing symptoms  HISTORY OF PRESENT ILLNESS:  [name] is a 81 y.o. year old female  with multiple medical problems including hypothyroidism, HTN, GERD, osteopenia, anxiety, depression. Hospitalized 02/01/2022 to 02/04/2022 for right subcapital femoral fracture s/p cannulated hip pinning by orthopedics on 02/02/2022. D/C home where she resides with her husband. I conference called Pam and Mrs Taylor by telephone for initial telemedicine visit as video is not available for initial PC  consult. We talked about [name] being independent prior to her fall with recent hospitalization with goal to return to being independent. We talked about past medical history, hospitalization, ros, symptoms of pain worse with ambulation. We talked about pain management,  working with PT which just started. We talked about [name] is independent with adl's, ambulatory, mobility. Appetite is fair. We talked about medical goals of care. We talked about role pc in poc. Discussed and scheduled f/u pc visit telemedicine telephonic, will do in person if needed. Therapeutic listening, emotional support provided. Questions answered.   History obtained from review of EMR, Pam and Ms. Taylor.  I reviewed available labs, medications, imaging, studies and related documents from the EMR.  Records reviewed and summarized above.   ROS 10 point systems reviewed with [name] all negative except HPI, pain with ambulation  Physical Exam: deferred CURRENT PROBLEM LIST:  Patient Active Problem List   Diagnosis Date Noted   Closed subcapital fracture of neck of right femur ([name]) 02/04/2022   Femoral fracture ([name]) 02/01/2022   S/P reverse total shoulder arthroplasty, left 09/09/2018   Hypothyroidism, postop 11/19/2016   Cyst in hand 08/23/2014   Preventative health care 07/10/2014   Attention deficit disorder 07/10/2014   Obesity (BMI 30-39.9) 12/26/2013   GERD (gastroesophageal reflux disease) 11/17/2012   Depression with anxiety 10/28/2010   INSOMNIA 08/07/2010   ALLERGIC RHINITIS 11/30/2008   OSTEOPENIA 03/10/2008   Hyperlipidemia 04/19/2007   Left carotid bruit 04/19/2007   Essential hypertension 04/07/2007   Osteoarthritis 04/07/2007   PAST MEDICAL HISTORY:  Active Ambulatory Problems    Diagnosis Date Noted   Hyperlipidemia 04/19/2007   Essential hypertension 04/07/2007   ALLERGIC RHINITIS 11/30/2008   Osteoarthritis 04/07/2007   OSTEOPENIA 03/10/2008   INSOMNIA 08/07/2010   Left carotid bruit 04/19/2007   Depression with anxiety 10/28/2010   GERD (gastroesophageal reflux disease) 11/17/2012   Obesity (BMI 30-39.9) 12/26/2013   Preventative health care 07/10/2014   Attention deficit disorder 07/10/2014   Cyst in hand 08/23/2014    Hypothyroidism, postop 11/19/2016   S/P reverse total shoulder arthroplasty, left 09/09/2018   Femoral fracture ([name]) 02/01/2022   Closed subcapital fracture of neck of right femur ([name]) 02/04/2022   Resolved Ambulatory Problems    Diagnosis Date Noted   URI 09/04/2009   ASTHMA UNSPECIFIED WITH EXACERBATION 02/17/2008   UTI 07/20/2009   GANGLION CYST 04/19/2007   Closed fracture of lateral malleolus 06/04/2009   CLOSED FRACTURE OF METATARSAL BONE 06/04/2009   Abnormal TSH 07/10/2014   Toxic nodular goiter 08/18/2014   Laceration of finger of left hand 12/15/2014   Past Medical History:  Diagnosis Date   Anxiety    CAROTID BRUIT, LEFT 04/19/2007   Depression    Esophageal reflux 11/08/2008   HYPERTENSION 04/07/2007   Hypothyroidism    OSTEOARTHRITIS 04/07/2007   PONV (postoperative nausea and vomiting)    SOCIAL HX:  Social History   Tobacco Use   Smoking status: Former    Packs/day: 1.50    Years: 20.00    Total pack years: 30.00    Types: Cigarettes    Quit date: 09/15/1968    Years since quitting: 53.4   Smokeless tobacco: Never   Tobacco comments:    smoked 1.5 for  20 years  Substance Use Topics   Alcohol use: No   FAMILY HX:  Family History  Problem Relation Age of Onset   Heart disease Mother  Mother also has significant carotid artery stenosis   COPD Father    Alcoholism Father    Cancer Brother    Alzheimer's disease Sister    Thyroid disease Sister        Goiter   Breast cancer Neg Hx       ALLERGIES: No Known Allergies   PERTINENT MEDICATIONS:  Outpatient Encounter Medications as of 02/27/2022  Medication Sig   amLODipine (NORVASC) 10 MG tablet Take 1 tablet (10 mg total) by mouth daily.   amphetamine-dextroamphetamine (ADDERALL) 30 MG tablet Take 1 tablet by mouth 2 (two) times daily. Fill in 2 months   buPROPion (WELLBUTRIN XL) 300 MG 24 hr tablet Take 1 tablet (300 mg total) by mouth daily.   docusate sodium (COLACE) 100 MG capsule Take 1  capsule (100 mg total) by mouth 2 (two) times daily.   escitalopram (LEXAPRO) 10 MG tablet Take 1 tablet (10 mg total) by mouth daily.   hydrochlorothiazide (HYDRODIURIL) 12.5 MG tablet Take 1 tablet (12.5 mg total) by mouth daily.   HYDROcodone-acetaminophen (NORCO/VICODIN) 5-325 MG tablet Take 1-2 tablets by mouth every 4 (four) hours as needed for moderate pain or severe pain.   levothyroxine (SYNTHROID) 50 MCG tablet Take 1 tablet (50 mcg total) by mouth daily.   losartan (COZAAR) 100 MG tablet Take 1 tablet (100 mg total) by mouth daily. TAKE 1 TABLET BY MOUTH ONCE DAILY.   melatonin 3 MG TABS tablet Take 1 tablet (3 mg total) by mouth at bedtime.   Multiple Vitamin (MULTIVITAMIN) capsule Take 1 capsule by mouth daily. Centrum Silver   Polyethyl Glycol-Propyl Glycol 0.4-0.3 % SOLN Place 2 drops into both eyes 2 (two) times daily.   polyethylene glycol (MIRALAX / GLYCOLAX) 17 g packet Take 17 g by mouth daily.   rivaroxaban (XARELTO) 10 MG TABS tablet 1 tablet daily through 03/19/2022 and stop   senna (SENOKOT) 8.6 MG TABS tablet Take 1 tablet (8.6 mg total) by mouth 2 (two) times daily.   No facility-administered encounter medications on file as of 02/27/2022.   Thank you for the opportunity to participate in the care of Ms. Secrest.  The palliative care team will continue to follow. Please call our office at [phone] if we can be of additional assistance.   Alvaretta Eisenberger Z Elaria Osias, NP ,

## 2022-03-05 DIAGNOSIS — F32A Depression, unspecified: Secondary | ICD-10-CM | POA: Diagnosis not present

## 2022-03-05 DIAGNOSIS — I10 Essential (primary) hypertension: Secondary | ICD-10-CM | POA: Diagnosis not present

## 2022-03-05 DIAGNOSIS — F419 Anxiety disorder, unspecified: Secondary | ICD-10-CM | POA: Diagnosis not present

## 2022-03-05 DIAGNOSIS — J45909 Unspecified asthma, uncomplicated: Secondary | ICD-10-CM | POA: Diagnosis not present

## 2022-03-05 DIAGNOSIS — S72011D Unspecified intracapsular fracture of right femur, subsequent encounter for closed fracture with routine healing: Secondary | ICD-10-CM | POA: Diagnosis not present

## 2022-03-05 DIAGNOSIS — M19011 Primary osteoarthritis, right shoulder: Secondary | ICD-10-CM | POA: Diagnosis not present

## 2022-03-06 DIAGNOSIS — S72011D Unspecified intracapsular fracture of right femur, subsequent encounter for closed fracture with routine healing: Secondary | ICD-10-CM | POA: Diagnosis not present

## 2022-03-06 DIAGNOSIS — M19011 Primary osteoarthritis, right shoulder: Secondary | ICD-10-CM | POA: Diagnosis not present

## 2022-03-06 DIAGNOSIS — F32A Depression, unspecified: Secondary | ICD-10-CM | POA: Diagnosis not present

## 2022-03-06 DIAGNOSIS — I10 Essential (primary) hypertension: Secondary | ICD-10-CM | POA: Diagnosis not present

## 2022-03-06 DIAGNOSIS — F419 Anxiety disorder, unspecified: Secondary | ICD-10-CM | POA: Diagnosis not present

## 2022-03-06 DIAGNOSIS — J45909 Unspecified asthma, uncomplicated: Secondary | ICD-10-CM | POA: Diagnosis not present

## 2022-03-13 DIAGNOSIS — S72011D Unspecified intracapsular fracture of right femur, subsequent encounter for closed fracture with routine healing: Secondary | ICD-10-CM | POA: Diagnosis not present

## 2022-03-13 DIAGNOSIS — M19011 Primary osteoarthritis, right shoulder: Secondary | ICD-10-CM | POA: Diagnosis not present

## 2022-03-13 DIAGNOSIS — F419 Anxiety disorder, unspecified: Secondary | ICD-10-CM | POA: Diagnosis not present

## 2022-03-13 DIAGNOSIS — I10 Essential (primary) hypertension: Secondary | ICD-10-CM | POA: Diagnosis not present

## 2022-03-13 DIAGNOSIS — F32A Depression, unspecified: Secondary | ICD-10-CM | POA: Diagnosis not present

## 2022-03-13 DIAGNOSIS — J45909 Unspecified asthma, uncomplicated: Secondary | ICD-10-CM | POA: Diagnosis not present

## 2022-03-19 DIAGNOSIS — J45909 Unspecified asthma, uncomplicated: Secondary | ICD-10-CM | POA: Diagnosis not present

## 2022-03-19 DIAGNOSIS — I10 Essential (primary) hypertension: Secondary | ICD-10-CM | POA: Diagnosis not present

## 2022-03-19 DIAGNOSIS — F419 Anxiety disorder, unspecified: Secondary | ICD-10-CM | POA: Diagnosis not present

## 2022-03-19 DIAGNOSIS — M19011 Primary osteoarthritis, right shoulder: Secondary | ICD-10-CM | POA: Diagnosis not present

## 2022-03-19 DIAGNOSIS — F32A Depression, unspecified: Secondary | ICD-10-CM | POA: Diagnosis not present

## 2022-03-19 DIAGNOSIS — S72011D Unspecified intracapsular fracture of right femur, subsequent encounter for closed fracture with routine healing: Secondary | ICD-10-CM | POA: Diagnosis not present

## 2022-03-20 ENCOUNTER — Encounter: Payer: Self-pay | Admitting: Nurse Practitioner

## 2022-03-20 ENCOUNTER — Other Ambulatory Visit: Payer: Medicare Other | Admitting: Nurse Practitioner

## 2022-03-20 DIAGNOSIS — Z515 Encounter for palliative care: Secondary | ICD-10-CM

## 2022-03-20 DIAGNOSIS — R52 Pain, unspecified: Secondary | ICD-10-CM | POA: Diagnosis not present

## 2022-03-20 NOTE — Progress Notes (Signed)
[facility] Consult Note Telephone: [phone]  Fax: [phone]    Date of encounter: 03/20/22 4:04 PM PATIENT NAME: [name] [address] [place] [address]   [phone] (home)  DOB: [birth year] MRN: [id number] PRIMARY CARE PROVIDER:    Celene Squibb, MD,  [place] [address] [phone]  REFERRING PROVIDER:   Celene Squibb, MD [place],  [place] [address] [phone]  RESPONSIBLE PARTY:    Contact Information     Name Relation Home Work Mobile   [place] Spouse   [phone]   [place], [place]   [phone]      Due to the COVID-19 crisis, this visit was done via telemedicine from my office and it was initiated and consent by this patient and or family.   I connected with Pam Tine with [name] [facility] on 02/27/22 by telephone as video not available enabled telemedicine application and verified that I am speaking with the correct person using two identifiers.   I discussed the limitations of evaluation and management by telemedicine. The patient expressed understanding and agreed to proceed. Palliative Care was asked to follow this patient by consultation request of  Celene Squibb, MD to address advance care planning and complex medical decision making. This is the initial visit.                       ASSESSMENT AND PLAN / RECOMMENDATIONS:  Symptom Management/Plan: 1. Advance Care Planning;  DNR   2. Goals of Care: Goals include to maximize quality of life and symptom management. Our advance care planning conversation included a discussion about:    The value and importance of advance care planning  Exploration of personal, cultural or spiritual beliefs that might influence medical decisions  Exploration of goals of care in the event of a sudden injury or illness  Identification and preparation of a healthcare agent  Review and updating or creation  of an advance directive document.   3. Pain with ambulation following right hip fracture. Improved, discussed and continue therapy, no longer requiring hydrocodone, just tylenol wen needed. Monitor efficacy vs adverse side effects   4. Palliative care encounter; Palliative care encounter; Palliative medicine team will continue to support patient, patient's family, and medical team. Visit consisted of counseling and education dealing with the complex and emotionally intense issues of symptom management and palliative care in the setting of serious and potentially life-threatening illness Follow up Palliative Care Visit: Palliative care will continue to follow for complex medical decision making, advance care planning, and clarification of goals. Return 8 weeks or prn.   I spent 31 minutes providing this consultation. More than 50% of the time in this consultation was spent in counseling and care coordination. PPS: 50% Chief Complaint: Initial palliative consult for complex medical decision making, address goals, manage ongoing symptoms   HISTORY OF PRESENT ILLNESS:  [name] is a 81 y.o. year old female  with multiple medical problems including hypothyroidism, HTN, GERD, osteopenia, anxiety, depression. Hospitalized 02/01/2022 to 02/04/2022 for right subcapital femoral fracture s/p cannulated hip pinning by orthopedics on 02/02/2022. D/C home where she resides with her husband. I conference called [name] by telephone for follow up telemedicine visit as video is not available for initial [facility] consult. We talked about how [name] has been feeling. We talked about ros, symptoms of pain worse  with ambulation. We talked about [name] is independent with adl's, ambulatory, mobility. We talked about fall precautions, no recent falls. Appetite has improved, no noted weight loss.  We talked about medical goals of care. We talked about role pc in poc. Discussed and scheduled f/u pc visit  telemedicine telephonic, will do in person if needed. Therapeutic listening, emotional support provided. Questions answered.    History obtained from review of EMR, [name].  I reviewed available labs, medications, imaging, studies and related documents from the EMR.  Records reviewed and summarized above.    ROS 10 point systems reviewed with [name] all negative except HPI   Physical Exam: deferred   Thank you for the opportunity to participate in the care of [name].  The palliative care team will continue to follow. Please call our office at [phone] if we can be of additional assistance.   Bradon Fester Ihor Gully, NP

## 2022-03-22 DIAGNOSIS — E669 Obesity, unspecified: Secondary | ICD-10-CM | POA: Diagnosis not present

## 2022-03-22 DIAGNOSIS — Z96612 Presence of left artificial shoulder joint: Secondary | ICD-10-CM | POA: Diagnosis not present

## 2022-03-22 DIAGNOSIS — E785 Hyperlipidemia, unspecified: Secondary | ICD-10-CM | POA: Diagnosis not present

## 2022-03-22 DIAGNOSIS — K5900 Constipation, unspecified: Secondary | ICD-10-CM | POA: Diagnosis not present

## 2022-03-22 DIAGNOSIS — Z87440 Personal history of urinary (tract) infections: Secondary | ICD-10-CM | POA: Diagnosis not present

## 2022-03-22 DIAGNOSIS — J309 Allergic rhinitis, unspecified: Secondary | ICD-10-CM | POA: Diagnosis not present

## 2022-03-22 DIAGNOSIS — S72011D Unspecified intracapsular fracture of right femur, subsequent encounter for closed fracture with routine healing: Secondary | ICD-10-CM | POA: Diagnosis not present

## 2022-03-22 DIAGNOSIS — M75101 Unspecified rotator cuff tear or rupture of right shoulder, not specified as traumatic: Secondary | ICD-10-CM | POA: Diagnosis not present

## 2022-03-22 DIAGNOSIS — E039 Hypothyroidism, unspecified: Secondary | ICD-10-CM | POA: Diagnosis not present

## 2022-03-22 DIAGNOSIS — I10 Essential (primary) hypertension: Secondary | ICD-10-CM | POA: Diagnosis not present

## 2022-03-22 DIAGNOSIS — Z6828 Body mass index (BMI) 28.0-28.9, adult: Secondary | ICD-10-CM | POA: Diagnosis not present

## 2022-03-22 DIAGNOSIS — Z9181 History of falling: Secondary | ICD-10-CM | POA: Diagnosis not present

## 2022-03-22 DIAGNOSIS — F32A Depression, unspecified: Secondary | ICD-10-CM | POA: Diagnosis not present

## 2022-03-22 DIAGNOSIS — K219 Gastro-esophageal reflux disease without esophagitis: Secondary | ICD-10-CM | POA: Diagnosis not present

## 2022-03-22 DIAGNOSIS — J45909 Unspecified asthma, uncomplicated: Secondary | ICD-10-CM | POA: Diagnosis not present

## 2022-03-22 DIAGNOSIS — Z9841 Cataract extraction status, right eye: Secondary | ICD-10-CM | POA: Diagnosis not present

## 2022-03-22 DIAGNOSIS — M8580 Other specified disorders of bone density and structure, unspecified site: Secondary | ICD-10-CM | POA: Diagnosis not present

## 2022-03-22 DIAGNOSIS — Z87891 Personal history of nicotine dependence: Secondary | ICD-10-CM | POA: Diagnosis not present

## 2022-03-22 DIAGNOSIS — Z9842 Cataract extraction status, left eye: Secondary | ICD-10-CM | POA: Diagnosis not present

## 2022-03-22 DIAGNOSIS — F419 Anxiety disorder, unspecified: Secondary | ICD-10-CM | POA: Diagnosis not present

## 2022-03-22 DIAGNOSIS — Z7951 Long term (current) use of inhaled steroids: Secondary | ICD-10-CM | POA: Diagnosis not present

## 2022-03-22 DIAGNOSIS — M19011 Primary osteoarthritis, right shoulder: Secondary | ICD-10-CM | POA: Diagnosis not present

## 2022-03-26 DIAGNOSIS — I10 Essential (primary) hypertension: Secondary | ICD-10-CM | POA: Diagnosis not present

## 2022-03-26 DIAGNOSIS — F419 Anxiety disorder, unspecified: Secondary | ICD-10-CM | POA: Diagnosis not present

## 2022-03-26 DIAGNOSIS — M19011 Primary osteoarthritis, right shoulder: Secondary | ICD-10-CM | POA: Diagnosis not present

## 2022-03-26 DIAGNOSIS — S72011D Unspecified intracapsular fracture of right femur, subsequent encounter for closed fracture with routine healing: Secondary | ICD-10-CM | POA: Diagnosis not present

## 2022-03-26 DIAGNOSIS — F32A Depression, unspecified: Secondary | ICD-10-CM | POA: Diagnosis not present

## 2022-03-26 DIAGNOSIS — J45909 Unspecified asthma, uncomplicated: Secondary | ICD-10-CM | POA: Diagnosis not present

## 2022-03-27 DIAGNOSIS — F419 Anxiety disorder, unspecified: Secondary | ICD-10-CM | POA: Diagnosis not present

## 2022-03-27 DIAGNOSIS — J45909 Unspecified asthma, uncomplicated: Secondary | ICD-10-CM | POA: Diagnosis not present

## 2022-03-27 DIAGNOSIS — M19011 Primary osteoarthritis, right shoulder: Secondary | ICD-10-CM | POA: Diagnosis not present

## 2022-03-27 DIAGNOSIS — I10 Essential (primary) hypertension: Secondary | ICD-10-CM | POA: Diagnosis not present

## 2022-03-27 DIAGNOSIS — S72011D Unspecified intracapsular fracture of right femur, subsequent encounter for closed fracture with routine healing: Secondary | ICD-10-CM | POA: Diagnosis not present

## 2022-03-27 DIAGNOSIS — F32A Depression, unspecified: Secondary | ICD-10-CM | POA: Diagnosis not present

## 2022-03-28 DIAGNOSIS — F331 Major depressive disorder, recurrent, moderate: Secondary | ICD-10-CM | POA: Diagnosis not present

## 2022-03-28 DIAGNOSIS — E669 Obesity, unspecified: Secondary | ICD-10-CM | POA: Diagnosis not present

## 2022-03-28 DIAGNOSIS — F411 Generalized anxiety disorder: Secondary | ICD-10-CM | POA: Diagnosis not present

## 2022-03-28 DIAGNOSIS — Z6830 Body mass index (BMI) 30.0-30.9, adult: Secondary | ICD-10-CM | POA: Diagnosis not present

## 2022-03-28 DIAGNOSIS — S72011D Unspecified intracapsular fracture of right femur, subsequent encounter for closed fracture with routine healing: Secondary | ICD-10-CM | POA: Diagnosis not present

## 2022-03-28 DIAGNOSIS — E039 Hypothyroidism, unspecified: Secondary | ICD-10-CM | POA: Diagnosis not present

## 2022-03-28 DIAGNOSIS — I10 Essential (primary) hypertension: Secondary | ICD-10-CM | POA: Diagnosis not present

## 2022-03-28 DIAGNOSIS — R944 Abnormal results of kidney function studies: Secondary | ICD-10-CM | POA: Diagnosis not present

## 2022-03-28 DIAGNOSIS — F900 Attention-deficit hyperactivity disorder, predominantly inattentive type: Secondary | ICD-10-CM | POA: Diagnosis not present

## 2022-03-31 DIAGNOSIS — F32A Depression, unspecified: Secondary | ICD-10-CM | POA: Diagnosis not present

## 2022-03-31 DIAGNOSIS — F419 Anxiety disorder, unspecified: Secondary | ICD-10-CM | POA: Diagnosis not present

## 2022-03-31 DIAGNOSIS — M19011 Primary osteoarthritis, right shoulder: Secondary | ICD-10-CM | POA: Diagnosis not present

## 2022-03-31 DIAGNOSIS — S72011D Unspecified intracapsular fracture of right femur, subsequent encounter for closed fracture with routine healing: Secondary | ICD-10-CM | POA: Diagnosis not present

## 2022-03-31 DIAGNOSIS — J45909 Unspecified asthma, uncomplicated: Secondary | ICD-10-CM | POA: Diagnosis not present

## 2022-03-31 DIAGNOSIS — I10 Essential (primary) hypertension: Secondary | ICD-10-CM | POA: Diagnosis not present

## 2022-04-08 DIAGNOSIS — F419 Anxiety disorder, unspecified: Secondary | ICD-10-CM | POA: Diagnosis not present

## 2022-04-08 DIAGNOSIS — I10 Essential (primary) hypertension: Secondary | ICD-10-CM | POA: Diagnosis not present

## 2022-04-08 DIAGNOSIS — F32A Depression, unspecified: Secondary | ICD-10-CM | POA: Diagnosis not present

## 2022-04-08 DIAGNOSIS — J45909 Unspecified asthma, uncomplicated: Secondary | ICD-10-CM | POA: Diagnosis not present

## 2022-04-08 DIAGNOSIS — S72011D Unspecified intracapsular fracture of right femur, subsequent encounter for closed fracture with routine healing: Secondary | ICD-10-CM | POA: Diagnosis not present

## 2022-04-08 DIAGNOSIS — M19011 Primary osteoarthritis, right shoulder: Secondary | ICD-10-CM | POA: Diagnosis not present

## 2022-04-14 DIAGNOSIS — F32A Depression, unspecified: Secondary | ICD-10-CM | POA: Diagnosis not present

## 2022-04-14 DIAGNOSIS — M19011 Primary osteoarthritis, right shoulder: Secondary | ICD-10-CM | POA: Diagnosis not present

## 2022-04-14 DIAGNOSIS — I10 Essential (primary) hypertension: Secondary | ICD-10-CM | POA: Diagnosis not present

## 2022-04-14 DIAGNOSIS — F419 Anxiety disorder, unspecified: Secondary | ICD-10-CM | POA: Diagnosis not present

## 2022-04-14 DIAGNOSIS — S72011D Unspecified intracapsular fracture of right femur, subsequent encounter for closed fracture with routine healing: Secondary | ICD-10-CM | POA: Diagnosis not present

## 2022-04-14 DIAGNOSIS — J45909 Unspecified asthma, uncomplicated: Secondary | ICD-10-CM | POA: Diagnosis not present

## 2022-04-24 ENCOUNTER — Ambulatory Visit: Payer: Medicare Other | Admitting: Family Medicine

## 2022-05-07 DIAGNOSIS — F411 Generalized anxiety disorder: Secondary | ICD-10-CM | POA: Diagnosis not present

## 2022-05-07 DIAGNOSIS — Z6835 Body mass index (BMI) 35.0-35.9, adult: Secondary | ICD-10-CM | POA: Diagnosis not present

## 2022-05-07 DIAGNOSIS — I10 Essential (primary) hypertension: Secondary | ICD-10-CM | POA: Diagnosis not present

## 2022-05-07 DIAGNOSIS — F331 Major depressive disorder, recurrent, moderate: Secondary | ICD-10-CM | POA: Diagnosis not present

## 2022-05-07 DIAGNOSIS — E039 Hypothyroidism, unspecified: Secondary | ICD-10-CM | POA: Diagnosis not present

## 2022-05-07 DIAGNOSIS — F900 Attention-deficit hyperactivity disorder, predominantly inattentive type: Secondary | ICD-10-CM | POA: Diagnosis not present

## 2022-05-13 ENCOUNTER — Ambulatory Visit (INDEPENDENT_AMBULATORY_CARE_PROVIDER_SITE_OTHER): Payer: Medicare Other | Admitting: Family Medicine

## 2022-05-13 ENCOUNTER — Encounter: Payer: Self-pay | Admitting: Family Medicine

## 2022-05-13 ENCOUNTER — Encounter: Payer: Medicare Other | Admitting: Nurse Practitioner

## 2022-05-13 VITALS — BP 122/60 | HR 93 | Temp 98.0°F | Ht 64.0 in | Wt 163.4 lb

## 2022-05-13 DIAGNOSIS — I10 Essential (primary) hypertension: Secondary | ICD-10-CM | POA: Diagnosis not present

## 2022-05-13 DIAGNOSIS — E890 Postprocedural hypothyroidism: Secondary | ICD-10-CM

## 2022-05-13 DIAGNOSIS — E7800 Pure hypercholesterolemia, unspecified: Secondary | ICD-10-CM

## 2022-05-13 DIAGNOSIS — F324 Major depressive disorder, single episode, in partial remission: Secondary | ICD-10-CM

## 2022-05-13 DIAGNOSIS — M85861 Other specified disorders of bone density and structure, right lower leg: Secondary | ICD-10-CM

## 2022-05-13 DIAGNOSIS — Q019 Encephalocele, unspecified: Secondary | ICD-10-CM | POA: Diagnosis not present

## 2022-05-13 DIAGNOSIS — R413 Other amnesia: Secondary | ICD-10-CM | POA: Diagnosis not present

## 2022-05-13 MED ORDER — DESVENLAFAXINE SUCCINATE ER 25 MG PO TB24: 25.0000 mg | ORAL_TABLET | Freq: Every day | ORAL | 5 refills | Status: DC

## 2022-05-13 NOTE — Progress Notes (Signed)
This encounter was created in error - please disregard.

## 2022-05-13 NOTE — Progress Notes (Signed)
Phone: [phone]   Subjective:  Patient presents today to establish care.  Prior patient of Dr. Wende Neighbors. -Appears to have had visit 05/07/2022 with labs Chief Complaint  Patient presents with   Annual Exam    Medicare only-no CPE   Depression    Pt would like to elaborate on depression.   See problem oriented charting  The following were reviewed and entered/updated in epic: Past Medical History:  Diagnosis Date   ALLERGIC RHINITIS 11/30/2008   Anxiety    CAROTID BRUIT, LEFT 04/19/2007   ultrasound reassuring   Closed fracture of lateral malleolus 06/04/2009   CLOSED FRACTURE OF METATARSAL BONE 06/04/2009   Depression    Esophageal reflux 11/08/2008   GANGLION CYST 04/19/2007   HYPERTENSION 04/07/2007   Hypothyroidism    INSOMNIA 08/07/2010   OSTEOARTHRITIS 04/07/2007   OSTEOPENIA 03/10/2008   PONV (postoperative nausea and vomiting)    UTI 07/20/2009   Patient Active Problem List   Diagnosis Date Noted   Depression with anxiety 10/28/2010    Priority: High   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[place]) 05/13/2022    Priority: Medium    History of hip fracture 02/04/2022    Priority: Medium    Hypothyroidism, postop 11/19/2016    Priority: Medium    Attention deficit disorder 07/10/2014    Priority: Medium    OSTEOPENIA 03/10/2008    Priority: Medium    Hyperlipidemia 04/19/2007    Priority: Medium    Essential hypertension 04/07/2007    Priority: Medium    GERD (gastroesophageal reflux disease) 11/17/2012    Priority: Low   INSOMNIA 08/07/2010    Priority: Low   ALLERGIC RHINITIS 11/30/2008    Priority: Low   S/P reverse total shoulder arthroplasty, 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   Cyst in hand 08/23/2014    Priority: 1.   Osteoarthritis 04/07/2007    Priority: 1.   Past Surgical History:  Procedure Laterality Date   EYE SURGERY Bilateral    cataract removal   FUNCTIONAL ENDOSCOPIC SINUS SURGERY  01/2018   spinal fluid leaking behind right eye   HIP  PINNING,CANNULATED Right 02/02/2022   Procedure: CANNULATED HIP PINNING;  Surgeon: Georgeanna Harrison, MD;  Location: [facility];  Service: Orthopedics;  Laterality: Right;   JOINT REPLACEMENT     knuckle   REVERSE SHOULDER ARTHROPLASTY Left 09/09/2018   Procedure: REVERSE SHOULDER ARTHROPLASTY;  Surgeon: Justice Britain, MD;  Location: [facility];  Service: Orthopedics;  Laterality: Left;  134mn   TOTAL HIP ARTHROPLASTY Left    TUBAL LIGATION      Family History  Problem Relation Age of Onset   Heart disease Mother        Mother also has significant carotid artery stenosis   Dementia Mother    COPD Father    Alcoholism Father    Alzheimer's disease Sister    Thyroid disease Sister        Goiter   COPD Sister    Lung cancer Brother        small cell carcinoma that spread   Breast cancer Neg Hx     Medications- reviewed and updated Current Outpatient Medications  Medication Sig Dispense Refill   amLODipine (NORVASC) 10 MG tablet Take 1 tablet (10 mg total) by mouth daily. 30 tablet 0   amphetamine-dextroamphetamine (ADDERALL) 30 MG tablet Take 1 tablet by mouth 2 (two) times daily. Fill in 2 months 60 tablet 0   buPROPion (WELLBUTRIN XL) 300 MG 24 hr tablet Take 1  tablet (300 mg total) by mouth daily. 90 tablet 1   cholecalciferol (VITAMIN D3) 25 MCG (1000 UNIT) tablet Take 1,000 Units by mouth daily.     desvenlafaxine (PRISTIQ) 25 MG 24 hr tablet Take 1 tablet (25 mg total) by mouth daily. 30 tablet 5   hydrochlorothiazide (HYDRODIURIL) 12.5 MG tablet Take 1 tablet (12.5 mg total) by mouth daily. 30 tablet 0   levothyroxine (SYNTHROID) 50 MCG tablet Take 1 tablet (50 mcg total) by mouth daily. 30 tablet 0   losartan (COZAAR) 100 MG tablet Take 1 tablet (100 mg total) by mouth daily. TAKE 1 TABLET BY MOUTH ONCE DAILY. 30 tablet 0   Multiple Vitamin (MULTIVITAMIN) capsule Take 1 capsule by mouth daily. Centrum Silver     Polyethyl Glycol-Propyl Glycol 0.4-0.3 % SOLN Place 2 drops into both eyes  2 (two) times daily.     No current facility-administered medications for this visit.    Allergies-reviewed and updated No Known Allergies  Social History   Social History Narrative   Married for 50 years. 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 1 son. 2 grandkids, 1 step greatgrandkid.       Retired at [employer]- worked for 41 years there      Hobbies: reports no hobbies/interests in 2023- in the past had done outdoor work and [place]- hard with orthopedic issues    Objective  Objective:  BP 122/60   Pulse 93   Temp 98 F (36.7 C)   Ht '5\' 4"'$  (1.626 m)   Wt 163 lb 6.4 oz (74.1 kg)   SpO2 97%   BMI 28.05 kg/m  Gen: NAD, resting comfortably CV: RRR no murmurs rubs or gallops Lungs: CTAB no crackles, wheeze, rhonchi Abdomen: soft/nontender/nondistended/normal bowel sounds. No rebound or guarding.  Ext: no edema Skin: warm, dry    Assessment and Plan:   # Memory loss S:over a year of issues reported in august 2023- increased forgetfulness gradually worsening A/P: Has recently had labs with previous PCP-we will try to get a copy of these records-would like to see CBC, CMP, TSH, B12.  Long-term monogamous relationship-does not need HIV or syphilis testing.  With prior cephalocele we will also get MRI of the brain to make sure this is stable as well as look for any evidence of stroke or mass - not wearing hearing aids regularly- encouraged to use these and update with [employer] -will refer to neurology for their expert opinion -depression could contribute  #hypertension S: medication: Amlodipine 10 mg, hydrochlorothiazide 12.5 mg, losartan 100 mg BP Readings from Last 3 Encounters:  05/13/22 122/60  02/19/22 121/65  02/04/22 125/83  A/P: Controlled. Continue current medications.   #hyperlipidemia S: Medication:none- past age for primary prevention  Lab Results  Component  Value Date   CHOL 211 (A) 11/21/2019   HDL 98 (A) 11/21/2019   LDLCALC 101 11/21/2019   LDLDIRECT 114.2 12/26/2013   TRIG 66 11/21/2019   CHOLHDL 3 05/23/2016   A/P: unlikely to start meds- will try to get records of recent labs  #hypothyroidism- reports radiation for nodules in the past S: compliant On thyroid medication-levothyroxine 50 mcg Lab Results  Component Value Date   TSH 2.94 02/29/2020  A/P:hopefully stable- get copy of updated tsh. Continue current meds for now   # Depression- ongoing issues since menopause #GAD- per Dr. Glennon Hamilton S: Medication:Lexapro 10 mg, Wellbutrin  300 mg extended release. Has been on this medication for years. Has not tried other medicines -has been more challenging after hip fracture- doesn't get up and about much and more challenging for her -reports has had poor control even on meds for years but worse after hip fracture- took away ability to work in the garden which was big for her -some visits from family and a dear friend I -potentially interested in senior center - has never seen therapist    05/13/2022    9:53 AM 05/23/2016    9:37 AM 04/02/2016   10:29 AM  Depression screen PHQ 2/9  Decreased Interest 3  0  Down, Depressed, Hopeless 0 1 0  PHQ - 2 Score 3 1 0  Altered sleeping 0    Tired, decreased energy 3    Change in appetite 0    Feeling bad or failure about yourself  3    Trouble concentrating 3    Moving slowly or fidgety/restless 0    Suicidal thoughts 0    PHQ-9 Score 12    Difficult doing work/chores Somewhat difficult    A/P: Poor control on current meds. Continue wellbutrin but... " Tomorrow morning- DO NOT TAKE LEXAPRO/ESCITALOPRAM 10 mg -instead start desvenlafaxine  25 mg - see me back in 6 weeks to recheck this specifically  I also think counseling/therapy would help Please call [phone] to schedule a visit with [facility] - please tell the office you were directly referred by Dr. Allen Norris  sometimes can be backed up for up to 2 months so here are some other options to check in on: - [email or web address]  at  [phone] [facility] of life counseling [email or web address] at [phone]"  - no family history of bipolar or personal history of obvious mania or hypomania. Discussed thyroid issues could contirbute if thyroid off  # Low Bone density (formerly osteopenia) S: Last DEXA: worst t score -1.9  Calcium: '1200mg'$  (through diet ok) recommended - does not take Vitamin D: 1000 units a day recommended- does not take bug agrees to start A/P: hopefully stable- update dexa. Continue current meds for now other than start vitamin D   # attention/energy issues- ADD diagnosis by Dr. Nevada Crane S:control: reasonable medication: Adderall 30 mg 2 times daily- mainly takes once a day- provides energy Original diagnosis: Dr. Wende Neighbors within 10 years. Dr. Glennon Hamilton psychologist contradicts this- states depression  A/P: Reviewed diagnosis from Dr. Glennon Hamilton psychological evaluation from 2016 and no indication of ADD. 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 Right sphenoid sinus fluid level continues to suggest CSF leak." -reports had surgery to correct this with ENT [facility] A/P: as above- update MRI of brain    #History of surgery for right valgus impacted subcapital femoral neck fracture with interval fixation 02/02/2022 with Dr. Lillia Abed postsurgical rehab hospitalization. Has upcoming appt with DR. Mable Fill- still with some balance and strength issues. Still recovering from this- has caused  set backs   Recommended follow up: Return in about 6 weeks (around 06/24/2022) for followup or sooner if needed.Schedule b4 you leave. Future  Appointments  Date Time Provider [place]  05/13/2022  2:00 PM Gusler, Christin Z, NP [facility] None   Meds ordered this encounter  Medications   desvenlafaxine (PRISTIQ) 25 MG 24 hr tablet    Sig: Take 1 tablet (25 mg total) by mouth daily.    Dispense:  30 tablet    Refill:  5    Time Spent: 63 minutes of total time (10:06 AM- 11:09 AM) was spent on the date of the encounter performing the following actions: chart review prior to seeing the patient, obtaining history, performing a medically necessary exam, counseling on the treatment plan and needed adjustments, placing orders, and documenting in our EHR.   Return precautions advised. Garret Reddish, MD

## 2022-05-13 NOTE — Patient Instructions (Addendum)
Flu shot- we should have these available within a month or two but please let us know if you get at outside pharmacy (high dose)  Reviewed diagnosis from Dr. Glennon Hamilton psychological evaluation from 2016 and no indication of ADD. This is one of my requirements for long term prescriptions of stimulants as especially in your age group risks can outweigh benefits- would recommend using remaining 30 mg tablets- cut in half for 2 weeks and take half dose then try to stop completely   Sign release of information at the check out desk for records from ENT   Sign release of information at the check out desk for all immunizations from Dr. Nevada Crane and labs/records in last year  With memory loss- wear hearing aids consistently. I like your idea of updating at [employer]  We will call you within two weeks about your referral to MR of brain. If you do not hear within 2 weeks, give [facility] a call  For memory loss- We will call you within two weeks about your referral to [facility]. If you do not hear within 2 weeks, give them a call directly  We will call you within two weeks about your referral for bone density. If you do not hear within 2 weeks, give [facility] a call directly  Start vitamin D 1000 units  Encourage you to find at least one activity you can do with other people at least once a week  Tomorrow morning- DO NOT TAKE LEXAPRO/ESCITALOPRAM 10 mg -instead start desvenlafaxine  25 mg - see me back in 6 weeks to recheck this specifically  I also think counseling/therapy would help Please call [phone] to schedule a visit with [facility] - please tell the office you were directly referred by Dr. Allen Norris sometimes can be backed up for up to 2 months so here are some other options to check in on: - [email or web address]  at  [phone] [facility] of life counseling [email or web address]-[email or web address] at [phone]   Recommended follow up: Return in about 6 weeks (around 06/24/2022) for followup or sooner if needed.Schedule b4 you leave.

## 2022-05-16 DIAGNOSIS — S72011D Unspecified intracapsular fracture of right femur, subsequent encounter for closed fracture with routine healing: Secondary | ICD-10-CM | POA: Diagnosis not present

## 2022-06-06 ENCOUNTER — Inpatient Hospital Stay (HOSPITAL_COMMUNITY): Admission: RE | Admit: 2022-06-06 | Payer: Medicare Other | Source: Ambulatory Visit

## 2022-06-10 DIAGNOSIS — Z23 Encounter for immunization: Secondary | ICD-10-CM | POA: Diagnosis not present

## 2022-06-26 ENCOUNTER — Ambulatory Visit: Payer: Medicare Other | Admitting: Family Medicine

## 2022-07-02 DIAGNOSIS — S72011D Unspecified intracapsular fracture of right femur, subsequent encounter for closed fracture with routine healing: Secondary | ICD-10-CM | POA: Diagnosis not present

## 2022-08-14 ENCOUNTER — Encounter: Payer: Self-pay | Admitting: Family Medicine

## 2022-08-14 ENCOUNTER — Ambulatory Visit (INDEPENDENT_AMBULATORY_CARE_PROVIDER_SITE_OTHER): Payer: Medicare Other | Admitting: Family Medicine

## 2022-08-14 VITALS — BP 164/94 | HR 70 | Temp 96.5°F | Ht 64.0 in | Wt 173.8 lb

## 2022-08-14 DIAGNOSIS — F418 Other specified anxiety disorders: Secondary | ICD-10-CM | POA: Diagnosis not present

## 2022-08-14 DIAGNOSIS — I10 Essential (primary) hypertension: Secondary | ICD-10-CM

## 2022-08-14 DIAGNOSIS — E890 Postprocedural hypothyroidism: Secondary | ICD-10-CM | POA: Diagnosis not present

## 2022-08-14 MED ORDER — AMLODIPINE BESYLATE 10 MG PO TABS
10.0000 mg | ORAL_TABLET | Freq: Every day | ORAL | 3 refills | Status: DC
Start: 2022-08-14 — End: 2023-02-13

## 2022-08-14 MED ORDER — BUPROPION HCL ER (XL) 300 MG PO TB24: 300.0000 mg | ORAL_TABLET | Freq: Every day | ORAL | 3 refills | Status: DC

## 2022-08-14 MED ORDER — HYDROCHLOROTHIAZIDE 12.5 MG PO TABS
12.5000 mg | ORAL_TABLET | Freq: Every day | ORAL | 3 refills | Status: DC
Start: 2022-08-14 — End: 2023-11-02

## 2022-08-14 MED ORDER — LEVOTHYROXINE SODIUM 88 MCG PO TABS: 88.0000 ug | ORAL_TABLET | Freq: Every day | ORAL | 3 refills | Status: DC

## 2022-08-14 MED ORDER — LOSARTAN POTASSIUM 100 MG PO TABS
100.0000 mg | ORAL_TABLET | Freq: Every day | ORAL | 3 refills | Status: DC
Start: 2022-08-14 — End: 2023-11-23

## 2022-08-14 MED ORDER — DESVENLAFAXINE SUCCINATE ER 50 MG PO TB24
50.0000 mg | ORAL_TABLET | Freq: Every day | ORAL | 5 refills | Status: DC
Start: 2022-08-14 — End: 2022-09-26

## 2022-08-14 NOTE — Patient Instructions (Addendum)
You are eligible to schedule your annual wellness visit with our nurse specialist Otila Kluver.  Please consider scheduling this before you leave today  For the finger -they prefer to monitor at home and try icing and tylenol and seek care if not improving or worsens  STOP lexapro 10 mg Start desvenlafaxine 50 mg (increase from 25 mg)  poor control but has not had meds yet today- should go home and take half doses of meds today and then take full pill tomorrow morning- update me tomorrow with home blood pressure reading  Please call [facility] to schedule MRI of brain- Their phone number is [phone].  Can call today or tomorrow  Please call [facility] to schedule Phone: [phone]  Please stop by lab before you go If you have mychart- we will send your results within 3 business days of [place] receiving them.  If you do not have mychart- we will call you about results within 5 business days of [place] receiving them.  *please also note that you will see labs on mychart as soon as they post. I will later go in and write notes on them- will say "notes from Dr. Yong Channel"   Recommended follow up: Return in about 6 weeks (around 09/25/2022) for followup or sooner if needed.Schedule b4 you leave.  Taking the medicine as directed and not missing any doses is one of the best things you can do to treat your depression.  Here are some things to keep in mind:  Side effects (stomach upset, some increased anxiety) may happen before you notice a benefit.  These side effects typically go away over time. Changes to your dose of medicine or a change in medication all together is sometimes necessary Most people need to be on medication at least 6-12 months Many people will notice an improvement within two weeks but the full effect of the medication can take up to 4-6 weeks Stopping the medication when you start feeling better often results in a return of symptoms If you start having thoughts of hurting  yourself or others after starting this medicine, call our office immediately at [phone] or seek care through 911.

## 2022-08-14 NOTE — Progress Notes (Signed)
Phone [phone] In person visit   Subjective:   [name] is a 81 y.o. year old very pleasant female patient who presents for/with See problem oriented charting Chief Complaint  Patient presents with   Follow-up    Pt has a couple questions. 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   Cyst in hand 08/23/2014    Priority: 1.   Osteoarthritis 04/07/2007    Priority: 1.    Medications- reviewed and updated Current Outpatient Medications  Medication Sig Dispense Refill   cholecalciferol (VITAMIN D3) 25 MCG (1000 UNIT) tablet Take 1,000 Units by mouth daily.     desvenlafaxine (PRISTIQ) 50 MG 24 hr tablet Take 1 tablet (50 mg total) by mouth daily. 30 tablet 5   levothyroxine (SYNTHROID) 88 MCG tablet Take 1 tablet (88 mcg total) by mouth daily. 90 tablet 3   Multiple Vitamin (MULTIVITAMIN) capsule Take 1 capsule by mouth daily. Centrum Silver     Polyethyl Glycol-Propyl Glycol 0.4-0.3 % SOLN Place 2 drops into both eyes 2 (two) times daily.     amLODipine (NORVASC) 10 MG tablet Take 1 tablet (10 mg total) by mouth daily. 90 tablet 3   buPROPion (WELLBUTRIN XL) 300 MG 24 hr tablet Take 1 tablet (300 mg total)  by mouth daily. 90 tablet 3   hydrochlorothiazide (HYDRODIURIL) 12.5 MG tablet Take 1 tablet (12.5 mg total) by mouth daily. 90 tablet 3   losartan (COZAAR) 100 MG tablet Take 1 tablet (100 mg total) by mouth daily. TAKE 1 TABLET BY MOUTH ONCE DAILY. 90 tablet 3   No current facility-administered medications for this visit.     Objective:  BP (!) 164/94   Pulse 70   Temp (!) 96.5 F (35.8 C) (Temporal)   Ht '5\' 4"'$  (1.626 m)   Wt 173 lb 12.8 oz (78.8 kg)   SpO2 95%   BMI 29.83 kg/m  Gen: NAD, resting comfortably CV: RRR no murmurs rubs or gallops Lungs: some wheeze but seems to be transmitted upper repiratory Ext: no edema Skin: warm, dry    Assessment and Plan   #Fall- was going into garage and was using doorknob for support to step onto big step and slipped. Landed with some force on left arm- skid on left elbow- no significant pain. Left 4th finger landed almost direcctly on its tip- has bruised up pretty good. Has not tried medicine or icing yet - with how bruised this looked recommended x-ray but not convenient location -also offered sports med referral -they prefer to monitor at home and try icing and tylenol and seek  care if not improving or worsens  # Memory loss S:over a year of issues reported in august 2023- increased forgetfullness  A/P: see last note but main next steps were MRI and neuro referral- gave info to call to schedule MRI and to schedule neuro consult   #hypertension S: medication: Amlodipine 10 mg, hydrochlorothiazide 12.5 mg, losartan 100 mg BUT DID NOT TAKE MEDS TODAY BP Readings from Last 3 Encounters:  08/14/22 (!) 164/94  05/13/22 122/60  02/19/22 121/65  A/P: poor control but has not had meds yet today- should go home and take half doses of meds today and then take full pill tomorrow morning- update me tomorrow with home blood pressure reading  #hypothyroidism S: compliant On thyroid medication-levothyroxine 88 mcg Lab Results  Component  Value Date   TSH 2.94 02/29/2020  A/P:hopefully stable- update tsh today. Continue current meds for now   # Depression S: Medication:Lexapro 10 mg--> pristiq 25 mg, Wellbutrin 300 mg extended release    08/14/2022    2:55 PM 05/13/2022    9:53 AM 05/23/2016    9:37 AM  Depression screen PHQ 2/9  Decreased Interest 3 3   Down, Depressed, Hopeless 3 0 1  PHQ - 2 Score '6 3 1  '$ Altered sleeping 3 0   Tired, decreased energy 3 3   Change in appetite 0 0   Feeling bad or failure about yourself  3 3   Trouble concentrating 3 3   Moving slowly or fidgety/restless 0 0   Suicidal thoughts 0 0   PHQ-9 Score 18 12   Difficult doing work/chores Somewhat difficult Somewhat difficult   A/P: poor control- increase pristiq to 50 mg, stop lexapro (plan at last visit) -continue wellbutrin '300mg'$  but could cause poor sleep for now -advised against tylenol PM due to memory/age  -if not improving consider additing trazodone if mainly sleep or titrating pristiq further potentially -getting new hearing aids (not wearing today) - declines seeing therapist today  #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 Right sphenoid sinus fluid level continues to suggest CSF leak." -reports had surgery to correct this with ENT [facility] A/P: with memory loss trying to get MRI- to make sure no change   Recommended follow up: Return in about 6 weeks (around 09/25/2022) for followup or sooner if needed.Schedule b4 you leave.  Lab/Order associations:   ICD-10-CM   1. Hypothyroidism, postop  E89.0 TSH    2. Depression with anxiety  F41.8 buPROPion (WELLBUTRIN XL) 300 MG 24 hr tablet    3. Essential hypertension  I10 CBC with Differential/Platelet    Comprehensive metabolic panel      Meds ordered this encounter  Medications   levothyroxine (SYNTHROID) 88 MCG tablet    Sig: Take 1 tablet (88 mcg  total) by mouth daily.    Dispense:  90 tablet    Refill:  3   losartan (COZAAR) 100 MG tablet    Sig: Take 1 tablet (100 mg total) by mouth daily. TAKE 1 TABLET BY MOUTH ONCE DAILY.    Dispense:  90 tablet    Refill:  3   amLODipine (NORVASC) 10 MG tablet    Sig: Take 1 tablet (10 mg total) by mouth daily.    Dispense:  90 tablet    Refill:  3   hydrochlorothiazide (HYDRODIURIL) 12.5 MG tablet    Sig: Take 1 tablet (12.5 mg  total) by mouth daily.    Dispense:  90 tablet    Refill:  3   buPROPion (WELLBUTRIN XL) 300 MG 24 hr tablet    Sig: Take 1 tablet (300 mg total) by mouth daily.    Dispense:  90 tablet    Refill:  3   desvenlafaxine (PRISTIQ) 50 MG 24 hr tablet    Sig: Take 1 tablet (50 mg total) by mouth daily.    Dispense:  30 tablet    Refill:  5    Return precautions advised.  Garret Reddish, MD

## 2022-08-15 LAB — CBC WITH DIFFERENTIAL/PLATELET
Basophils Absolute: 0.1 10*3/uL (ref 0.0–0.1)
Basophils Relative: 0.6 % (ref 0.0–3.0)
Eosinophils Absolute: 0.2 10*3/uL (ref 0.0–0.7)
Eosinophils Relative: 1.6 % (ref 0.0–5.0)
HCT: 43.5 % (ref 36.0–46.0)
Hemoglobin: 14.3 g/dL (ref 12.0–15.0)
Lymphocytes Relative: 31.4 % (ref 12.0–46.0)
Lymphs Abs: 3.6 10*3/uL (ref 0.7–4.0)
MCHC: 32.8 g/dL (ref 30.0–36.0)
MCV: 92.5 fl (ref 78.0–100.0)
Monocytes Absolute: 0.8 10*3/uL (ref 0.1–1.0)
Monocytes Relative: 6.6 % (ref 3.0–12.0)
Neutro Abs: 6.9 10*3/uL (ref 1.4–7.7)
Neutrophils Relative %: 59.8 % (ref 43.0–77.0)
Platelets: 395 10*3/uL (ref 150.0–400.0)
RBC: 4.71 Mil/uL (ref 3.87–5.11)
RDW: 14.1 % (ref 11.5–15.5)
WBC: 11.5 10*3/uL — ABNORMAL HIGH (ref 4.0–10.5)

## 2022-08-15 LAB — COMPREHENSIVE METABOLIC PANEL
ALT: 19 U/L (ref 0–35)
AST: 20 U/L (ref 0–37)
Albumin: 4.2 g/dL (ref 3.5–5.2)
Alkaline Phosphatase: 106 U/L (ref 39–117)
BUN: 17 mg/dL (ref 6–23)
CO2: 30 mEq/L (ref 19–32)
Calcium: 9.7 mg/dL (ref 8.4–10.5)
Chloride: 104 mEq/L (ref 96–112)
Creatinine, Ser: 1.2 mg/dL (ref 0.40–1.20)
GFR: 42.12 mL/min — ABNORMAL LOW (ref >60.00)
Glucose, Bld: 103 mg/dL — ABNORMAL HIGH (ref 70–99)
Potassium: 4.3 mEq/L (ref 3.5–5.1)
Sodium: 142 mEq/L (ref 135–145)
Total Bilirubin: 0.3 mg/dL (ref 0.2–1.2)
Total Protein: 6.7 g/dL (ref 6.0–8.3)

## 2022-08-15 LAB — TSH: TSH: 8.9 u[IU]/mL — ABNORMAL HIGH (ref 0.35–5.50)

## 2022-09-25 ENCOUNTER — Ambulatory Visit: Payer: Medicare Other | Admitting: Family Medicine

## 2022-09-26 ENCOUNTER — Ambulatory Visit (INDEPENDENT_AMBULATORY_CARE_PROVIDER_SITE_OTHER): Payer: Medicare Other | Admitting: Family Medicine

## 2022-09-26 ENCOUNTER — Encounter: Payer: Self-pay | Admitting: Family Medicine

## 2022-09-26 VITALS — BP 117/73 | HR 75 | Temp 97.3°F | Ht 64.0 in | Wt 176.0 lb

## 2022-09-26 DIAGNOSIS — F418 Other specified anxiety disorders: Secondary | ICD-10-CM

## 2022-09-26 DIAGNOSIS — I10 Essential (primary) hypertension: Secondary | ICD-10-CM

## 2022-09-26 DIAGNOSIS — E890 Postprocedural hypothyroidism: Secondary | ICD-10-CM | POA: Diagnosis not present

## 2022-09-26 DIAGNOSIS — R413 Other amnesia: Secondary | ICD-10-CM | POA: Diagnosis not present

## 2022-09-26 MED ORDER — LEVOTHYROXINE SODIUM 100 MCG PO TABS: 100.0000 ug | ORAL_TABLET | Freq: Every day | ORAL | 5 refills | Status: DC

## 2022-09-26 MED ORDER — FLUOXETINE HCL 20 MG PO CAPS: 20.0000 mg | ORAL_CAPSULE | Freq: Every day | ORAL | 5 refills | Status: DC

## 2022-09-26 NOTE — Patient Instructions (Addendum)
Thyroid is under treated as of last visit- we were unable to connect with patient at that time- we will adjust today to 100 mcg (stop 88 mcg dose) and have her follow up in 6 weeks. We discussed this underactive thyroid could be causing depression.   -------------------------------------------------------------------------------------  Poor control of depression could be thyroid related but she would also prefer to change medicines - she will stop pristiq after dose this morning -tomorrow morning take prozac 20 mg instead  -may feel slgihtly off for a few days with the change ----------------------------------------------------------------------------------------  for memory loss- last visit plan was for her to call neurology and to schedule MRI- reminded again today and encouraged her to enlist the help of her husband   Please call [facility] to schedule MRI of brain- Their phone number is [phone].  Can call today or tomorrow  Please call [facility] to schedule Phone: [phone] --------------------------------------------------------------------------------------  Recommended follow up: Return in about 6 weeks (around 11/07/2022) for followup or sooner if needed.Schedule b4 you leave.

## 2022-09-26 NOTE — Progress Notes (Signed)
Phone [phone] In person visit   Subjective:   [name] is a 82 y.o. year old very pleasant female patient who presents for/with See problem oriented charting Chief Complaint  Patient presents with   Follow-up   Hypothyroidism    Pt wants to discuss an antidepressant, because the antidepressant she is on is not working.    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   Cyst in hand 08/23/2014    Priority: 1.   Osteoarthritis 04/07/2007    Priority: 1.    Medications- reviewed and updated Current Outpatient Medications  Medication Sig Dispense Refill   amLODipine (NORVASC) 10 MG tablet Take 1 tablet (10 mg total) by mouth daily. 90 tablet 3   buPROPion (WELLBUTRIN XL) 300 MG 24 hr tablet Take 1 tablet (300 mg total) by mouth daily. 90 tablet 3   cholecalciferol (VITAMIN D3) 25 MCG (1000 UNIT) tablet Take 1,000 Units by mouth daily.     FLUoxetine (PROZAC) 20 MG capsule Take 1 capsule (20 mg total) by mouth daily. 30 capsule 5   hydrochlorothiazide (HYDRODIURIL) 12.5 MG tablet Take 1 tablet (12.5 mg total) by mouth daily. 90 tablet 3   levothyroxine (SYNTHROID) 100 MCG tablet Take 1 tablet (100 mcg total) by mouth daily. 30  tablet 5   losartan (COZAAR) 100 MG tablet Take 1 tablet (100 mg total) by mouth daily. TAKE 1 TABLET BY MOUTH ONCE DAILY. 90 tablet 3   Multiple Vitamin (MULTIVITAMIN) capsule Take 1 capsule by mouth daily. Centrum Silver     Polyethyl Glycol-Propyl Glycol 0.4-0.3 % SOLN Place 2 drops into both eyes 2 (two) times daily.     No current facility-administered medications for this visit.     Objective:  BP 117/73 (BP Location: Right Arm, Patient Position: Sitting)   Pulse 75   Temp (!) 97.3 F (36.3 C) (Temporal)   Ht '5\' 4"'$  (1.626 m)   Wt 176 lb (79.8 kg)   SpO2 96%   BMI 30.21 kg/m  Gen: NAD, resting comfortably CV: RRR no murmurs rubs or gallops Lungs: CTAB no crackles, wheeze, rhonchi  Ext: no edema Skin: warm, dry    Assessment and Plan   # Memory loss S:over a year of issues reported in august 2023- increased forgetfullness  A/P: for memory loss- last visit plan was for her to call neurology and to schedule MRI- reminded again today and encouraged her to enlist the help of her husband   #hypertension S: medication: Amlodipine 10 mg, hydrochlorothiazide 12.5 mg, losartan 100 mg BP Readings from Last 3 Encounters:  09/26/22 117/73  08/14/22 (!) 164/94  05/13/22 122/60   A/P: stable- continue current medicines   #hypothyroidism S: compliant On thyroid medication-levothyroxine 100 mcg Lab Results  Component Value Date   TSH 8.90 (H) 08/14/2022  A/P:Thyroid is under treated as of last visit- we were unable to connect with patient at that time- we will adjust today to 100 mcg (stop 88 mcg dose) and have her follow up in 6 weeks. We discussed this underactive thyroid could be causing depression.     # Depression S: Medication:Lexapro 10 mg--> pristiq 25 mg--> '50mg'$ , Wellbutrin 300 mg extended release -she reflects back to her 52s and did well on prozac and wellbutrin- shed like to try this again    09/26/2022    1:09 PM 08/14/2022    2:55 PM 05/13/2022    9:53 AM   Depression screen PHQ 2/9  Decreased Interest '3 3 3  '$ Down, Depressed, Hopeless 3 3 0  PHQ - 2 Score '6 6 3  '$ Altered sleeping 3 3 0  Tired, decreased energy '3 3 3  '$ Change in appetite 1 0 0  Feeling bad or failure about yourself  '3 3 3  '$ Trouble concentrating '3 3 3  '$ Moving slowly or fidgety/restless 0 0 0  Suicidal thoughts 0 0 0  PHQ-9 Score '19 18 12  '$ Difficult doing work/chores Somewhat difficult Somewhat difficult Somewhat difficult  A/P: see note above about her preference for prozac. We will continue wellbutrin.  Poor control of depression could be thyroid related but she would also prefer to change medicines - she will stop pristiq after dose this morning -tomorrow morning take prozac 20 mg instead  -may feel slgihtly off for a few days with the change  Recommended follow up: Return in about 6 weeks (around 11/07/2022) for followup or sooner if needed.Schedule b4 you leave. Future Appointments  Date Time Provider [place]  09/26/2022  2:30 PM [facility]    Lab/Order associations:   ICD-10-CM   1. Memory loss  R41.3     2. Hypothyroidism, postop  E89.0     3. Essential hypertension  I10     4. Depression with anxiety  F41.8       Meds ordered this encounter  Medications   levothyroxine (SYNTHROID) 100 MCG tablet    Sig: Take 1 tablet (100 mcg total) by mouth daily.    Dispense:  30 tablet    Refill:  5   FLUoxetine (PROZAC) 20 MG capsule    Sig: Take 1 capsule (20 mg total) by mouth daily.    Dispense:  30 capsule    Refill:  5    Return precautions advised.  Garret Reddish, MD

## 2022-11-06 ENCOUNTER — Ambulatory Visit: Payer: Medicare Other | Admitting: Family Medicine

## 2022-11-28 ENCOUNTER — Ambulatory Visit: Payer: Medicare Other | Admitting: Family Medicine

## 2022-12-22 ENCOUNTER — Telehealth: Payer: Self-pay | Admitting: Family Medicine

## 2022-12-22 NOTE — Telephone Encounter (Signed)
Copied from CRM [id number]. Topic: Medicare AWV >> Dec 22, 2022 11:58 AM Gwenith Spitz wrote: Reason for CRM: Called patient to schedule Medicare Annual Wellness Visit (AWV). Left message for patient to call back and schedule Medicare Annual Wellness Visit (AWV).  Last date of AWV: N/A  Please schedule an appointment at any time with Inetta Fermo, [facility]. Please schedule AWVS with Inetta Fermo, [facility].  If any questions, please contact me at [phone].  Thank you ,  Gabriel Cirri [facility] AWV TEAM Direct Dial [phone]

## 2023-01-01 ENCOUNTER — Ambulatory Visit: Payer: Medicare Other | Admitting: Family Medicine

## 2023-01-22 ENCOUNTER — Telehealth: Payer: Self-pay

## 2023-01-22 NOTE — Telephone Encounter (Signed)
Received Rx request from [facility] for #90 of Desvenlafaxine ER Succinate 50mg . Medication not on current med list, ok to fill?

## 2023-01-25 NOTE — Telephone Encounter (Signed)
Please call and chat with her-at last visit we had tried to switch her to fluoxetine/Prozac as she felt like she did better on this in the past and with desvenlafaxine-if she is doing well on fluoxetine/Prozac we can discontinue the desvenlafaxine with the pharmacy (please call them to cancel the prescription of desvenlafaxine on file if she is doing well on fluoxetine)

## 2023-01-26 ENCOUNTER — Telehealth: Payer: Self-pay | Admitting: Family Medicine

## 2023-01-26 NOTE — Telephone Encounter (Signed)
Copied from CRM [id number]. Topic: Medicare AWV >> Jan 26, 2023  1:46 PM Gwenith Spitz wrote: Reason for CRM: Called patient to schedule Medicare Annual Wellness Visit (AWV). Left message for patient to call back and schedule Medicare Annual Wellness Visit (AWV).  Last date of AWV: 03/09/2019  Please schedule an appointment at any time with Inetta Fermo, [facility]. Please schedule AWVS with Inetta Fermo, [facility].  If any questions, please contact me at [phone].  Thank you ,  Gabriel Cirri [facility] AWV TEAM Direct Dial [phone]

## 2023-01-26 NOTE — Telephone Encounter (Signed)
Called and lm for pt tcb. 

## 2023-02-13 ENCOUNTER — Encounter: Payer: Self-pay | Admitting: Family Medicine

## 2023-02-13 ENCOUNTER — Ambulatory Visit (INDEPENDENT_AMBULATORY_CARE_PROVIDER_SITE_OTHER): Payer: Medicare Other | Admitting: Family Medicine

## 2023-02-13 VITALS — BP 148/92 | HR 75 | Temp 98.3°F | Resp 16 | Ht 64.5 in | Wt 187.2 lb

## 2023-02-13 DIAGNOSIS — E890 Postprocedural hypothyroidism: Secondary | ICD-10-CM | POA: Diagnosis not present

## 2023-02-13 DIAGNOSIS — F418 Other specified anxiety disorders: Secondary | ICD-10-CM

## 2023-02-13 DIAGNOSIS — R413 Other amnesia: Secondary | ICD-10-CM

## 2023-02-13 DIAGNOSIS — E7800 Pure hypercholesterolemia, unspecified: Secondary | ICD-10-CM

## 2023-02-13 DIAGNOSIS — I10 Essential (primary) hypertension: Secondary | ICD-10-CM | POA: Diagnosis not present

## 2023-02-13 LAB — COMPREHENSIVE METABOLIC PANEL
ALT: 11 U/L (ref 0–35)
AST: 19 U/L (ref 0–37)
Albumin: 4.2 g/dL (ref 3.5–5.2)
Alkaline Phosphatase: 90 U/L (ref 39–117)
BUN: 19 mg/dL (ref 6–23)
CO2: 30 mEq/L (ref 19–32)
Calcium: 10 mg/dL (ref 8.4–10.5)
Chloride: 100 mEq/L (ref 96–112)
Creatinine, Ser: 1.09 mg/dL (ref 0.40–1.20)
GFR: 47.11 mL/min — ABNORMAL LOW (ref >60.00)
Glucose, Bld: 109 mg/dL — ABNORMAL HIGH (ref 70–99)
Potassium: 4 mEq/L (ref 3.5–5.1)
Sodium: 138 mEq/L (ref 135–145)
Total Bilirubin: 0.5 mg/dL (ref 0.2–1.2)
Total Protein: 7.2 g/dL (ref 6.0–8.3)

## 2023-02-13 LAB — CBC WITH DIFFERENTIAL/PLATELET
Basophils Absolute: 0.1 10*3/uL (ref 0.0–0.1)
Basophils Relative: 0.8 % (ref 0.0–3.0)
Eosinophils Absolute: 0.2 10*3/uL (ref 0.0–0.7)
Eosinophils Relative: 1.9 % (ref 0.0–5.0)
HCT: 46.3 % — ABNORMAL HIGH (ref 36.0–46.0)
Hemoglobin: 15.1 g/dL — ABNORMAL HIGH (ref 12.0–15.0)
Lymphocytes Relative: 23.8 % (ref 12.0–46.0)
Lymphs Abs: 2.8 10*3/uL (ref 0.7–4.0)
MCHC: 32.7 g/dL (ref 30.0–36.0)
MCV: 91.6 fl (ref 78.0–100.0)
Monocytes Absolute: 1 10*3/uL (ref 0.1–1.0)
Monocytes Relative: 8.5 % (ref 3.0–12.0)
Neutro Abs: 7.6 10*3/uL (ref 1.4–7.7)
Neutrophils Relative %: 65 % (ref 43.0–77.0)
Platelets: 376 10*3/uL (ref 150.0–400.0)
RBC: 5.05 Mil/uL (ref 3.87–5.11)
RDW: 13.7 % (ref 11.5–15.5)
WBC: 11.7 10*3/uL — ABNORMAL HIGH (ref 4.0–10.5)

## 2023-02-13 LAB — TSH: TSH: 4.46 u[IU]/mL (ref 0.35–5.50)

## 2023-02-13 LAB — VITAMIN B12: Vitamin B-12: 218 pg/mL (ref 211–911)

## 2023-02-13 MED ORDER — AMLODIPINE BESYLATE 5 MG PO TABS
5.0000 mg | ORAL_TABLET | Freq: Every day | ORAL | 3 refills | Status: DC
Start: 2023-02-13 — End: 2023-05-26

## 2023-02-13 MED ORDER — BUPROPION HCL ER (XL) 150 MG PO TB24
150.0000 mg | ORAL_TABLET | Freq: Every day | ORAL | 3 refills | Status: DC
Start: 2023-02-13 — End: 2023-04-13

## 2023-02-13 NOTE — Addendum Note (Signed)
Addended by: Shelva Majestic on: 02/13/2023 02:50 PM   Modules accepted: Orders

## 2023-02-13 NOTE — Progress Notes (Signed)
Phone [phone] In person visit   Subjective:   [name] is a 82 y.o. year old very pleasant female patient who presents for/with See problem oriented charting Chief Complaint  Patient presents with   Hypothyroidism   Memory Loss    Past Medical History-  Patient Active Problem List   Diagnosis Date Noted   Memory loss 09/26/2022    Priority: High   Depression with anxiety 10/28/2010    Priority: High   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[facility]) 05/13/2022    Priority: Medium    History of hip fracture 02/04/2022    Priority: Medium    Hypothyroidism, postop 11/19/2016    Priority: Medium    Attention deficit disorder 07/10/2014    Priority: Medium    OSTEOPENIA 03/10/2008    Priority: Medium    Hyperlipidemia 04/19/2007    Priority: Medium    Essential hypertension 04/07/2007    Priority: Medium    GERD (gastroesophageal reflux disease) 11/17/2012    Priority: Low   INSOMNIA 08/07/2010    Priority: Low   ALLERGIC RHINITIS 11/30/2008    Priority: Low   S/P reverse total shoulder arthroplasty, 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   Cyst in hand 08/23/2014    Priority: 1.   Osteoarthritis 04/07/2007    Priority: 1.    Medications- reviewed and updated Current Outpatient Medications  Medication Sig Dispense Refill   amLODipine (NORVASC) 10 MG tablet Take 1 tablet (10 mg total) by mouth daily. 90 tablet 3   buPROPion (WELLBUTRIN XL) 300 MG 24 hr tablet Take 1 tablet (300 mg total) by mouth daily. 90 tablet 3   cholecalciferol (VITAMIN D3) 25 MCG (1000 UNIT) tablet Take 1,000 Units by mouth daily.     FLUoxetine (PROZAC) 20 MG capsule Take 1 capsule (20 mg total) by mouth daily. 30 capsule 5   hydrochlorothiazide (HYDRODIURIL) 12.5 MG tablet Take 1 tablet (12.5 mg total) by mouth daily. 90 tablet 3   levothyroxine (SYNTHROID) 100 MCG tablet Take 1 tablet (100 mcg total) by mouth daily. 30 tablet 5   losartan (COZAAR) 100 MG tablet Take 1 tablet (100 mg total) by mouth daily. TAKE 1  TABLET BY MOUTH ONCE DAILY. 90 tablet 3   Multiple Vitamin (MULTIVITAMIN) capsule Take 1 capsule by mouth daily. Centrum Silver     Polyethyl Glycol-Propyl Glycol 0.4-0.3 % SOLN Place 2 drops into both eyes 2 (two) times daily.     No current facility-administered medications for this visit.     Objective:  BP (!) 148/92   Pulse 75   Temp 98.3 F (36.8 C) (Temporal)   Resp 16   Ht 5' 4.5" (1.638 m)   Wt 187 lb 3.2 oz (84.9 kg)   SpO2 97%   BMI 31.64 kg/m  Gen: NAD, resting comfortably CV: RRR no murmurs rubs or gallops Lungs: CTAB no crackles, wheeze, rhonchi Ext: no edema Skin: warm, dry Neuro: hard of hearing    Assessment and Plan   # Memory loss S:over a year of issues reported in august 2023- increased forgetfullness  -At last 3 visits the plan has been to call neurology and schedule MRI-I reminded her again last visit but I do not see either scheduled yet -Under treated thyroid could contribute -dealing with fatigue, low motivation, joy is low A/P: ongoing issues- encouraged again neurology and MRI- husband came in today and will make sure this happens  -at this point with memory loss recommend that husband come with you to each  visit with me or any specialist  #hypertension S: medication:  hydrochlorothiazide 12.5 mg, losartan 100 mg -fell off amlodipine 10 mg BP Readings from Last 3 Encounters:  02/13/23 (!) 148/92  09/26/22 117/73  08/14/22 (!) 164/94  A/P: blood pressure above goal- restart amlodipine 5 mg and continue hydrochlorothiazide and losartan   #hypothyroidism S: compliant On thyroid medication-levothyroxine 100 mcg Lab Results  Component Value Date   TSH 8.90 (H) 08/14/2022  A/P:hopefully improved-update TSH with labs today but for now continue current medication   # Depression S: Medication: last visit we changed from Pristiq 50 mg to  fluoxetine 20 mg -unfortunately she stopped- Wellbutrin 300 mg extended release -reports poor control of  depression at this time. 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 Right sphenoid sinus fluid level continues to suggest CSF leak."  -reports had surgery to correct this with ENT [facility] A/P: as noted for memory loss- update MRI brain   # Hyperglycemia/insulin resistance/prediabetes- peak a1c 5.8 in 2021 S:  Medication: none  A/P: hopefully stable or improved- update a1c today. Continue without meds for now   #some wheezing- offered pulmonary referral but no shortness of breath and she wanted to hold off  Recommended follow up: Return in about 6 weeks (around 03/27/2023) for followup or sooner if needed.Schedule b4 you leave.  Lab/Order associations:   ICD-10-CM   1. Depression with anxiety  F41.8     2. Memory loss  R41.3     3. Essential hypertension  I10     4. Pure hypercholesterolemia  E78.00     5. Hypothyroidism, postop  E89.0      No orders of the defined types were placed in this encounter.  Return precautions advised.  Tana Conch, MD

## 2023-02-13 NOTE — Patient Instructions (Addendum)
blood pressure above goal- restart amlodipine 5 mg and continue hydrochlorothiazide and losartan  -------------------------------------------------------------------------------------  Poor control of depression  - she will stop Pristiq/desvenlafaxine after  after today- was supposed to stop at last visit -continue fluoxetine/ prozac 20 mg  -in 1 week restart Wellbutrin but at lower dose 150 mg XR ----------------------------------------------------------------------------------------  for memory loss- Please call [facility] to schedule MRI of brain- Their phone number is [phone].  Can call today or next week- if any issues/they need me to reenter order I can 2. Please call [place] [facility] to schedule Phone: [phone] 3. at this point with memory loss recommend that husband come with you to each visit with me or any specialist  Please stop by lab before you go If you have mychart- we will send your results within 3 business days of [place] receiving them.  If you do not have mychart- we will call you about results within 5 business days of [place] receiving them.  *please also note that you will see labs on mychart as soon as they post. I will later go in and write notes on them- will say "notes from Dr. Durene Cal"   Also make sure pharmacy discontinues amlodipine 10 mg and desvenlafaxine 50 mg   Recommended follow up: Return in about 6 weeks (around 03/27/2023) for followup or sooner if needed.Schedule b4 you leave. Or up to 8 weeks

## 2023-02-13 NOTE — Addendum Note (Signed)
Addended by: Sumner Boast on: 02/13/2023 04:01 PM   Modules accepted: Orders

## 2023-03-09 ENCOUNTER — Ambulatory Visit (INDEPENDENT_AMBULATORY_CARE_PROVIDER_SITE_OTHER): Payer: Medicare Other

## 2023-03-09 VITALS — BP 120/84 | HR 87 | Temp 98.0°F | Wt 185.6 lb

## 2023-03-09 DIAGNOSIS — Z0000 Encounter for general adult medical examination without abnormal findings: Secondary | ICD-10-CM | POA: Diagnosis not present

## 2023-03-09 NOTE — Patient Instructions (Signed)
[name] , Thank you for taking time to come for your Medicare Wellness Visit. I appreciate your ongoing commitment to your health goals. Please review the following plan we discussed and let me know if I can assist you in the future.   These are the goals we discussed:  Goals      Decrease soda or juice intake     Exercise 150 minutes per week (moderate activity)     Increase water intake        This is a list of the screening recommended for you and due dates:  Health Maintenance  Topic Date Due   Zoster (Shingles) Vaccine (1 of 2) Never done   COVID-19 Vaccine (6 - 2023-24 season) 08/02/2022   Flu Shot  04/16/2023   Medicare Annual Wellness Visit  03/08/2024   DTaP/Tdap/Td vaccine (5 - Td or Tdap) 06/29/2030   Pneumonia Vaccine  Completed   DEXA scan (bone density measurement)  Completed   HPV Vaccine  Aged Out    Advanced directives: copies in chart   Conditions/risks identified: lose weight   Next appointment: Follow up in one year for your annual wellness visit    Preventive Care 65 Years and Older, Female Preventive care refers to lifestyle choices and visits with your health care provider that can promote health and wellness. What does preventive care include? A yearly physical exam. This is also called an annual well check. Dental exams once or twice a year. Routine eye exams. Ask your health care provider how often you should have your eyes checked. Personal lifestyle choices, including: Daily care of your teeth and gums. Regular physical activity. Eating a healthy diet. Avoiding tobacco and drug use. Limiting alcohol use. Practicing safe sex. Taking low-dose aspirin every day. Taking vitamin and mineral supplements as recommended by your health care provider. What happens during an annual well check? The services and screenings done by your health care provider during your annual well check will depend on your age, overall health, lifestyle risk factors,  and family history of disease. Counseling  Your health care provider may ask you questions about your: Alcohol use. Tobacco use. Drug use. Emotional well-being. Home and relationship well-being. Sexual activity. Eating habits. History of falls. Memory and ability to understand (cognition). Work and work Astronomer. Reproductive health. Screening  You may have the following tests or measurements: Height, weight, and BMI. Blood pressure. Lipid and cholesterol levels. These may be checked every 5 years, or more frequently if you are over 29 years old. Skin check. Lung cancer screening. You may have this screening every year starting at age 10 if you have a 30-pack-year history of smoking and currently smoke or have quit within the past 15 years. Fecal occult blood test (FOBT) of the stool. You may have this test every year starting at age 72. Flexible sigmoidoscopy or colonoscopy. You may have a sigmoidoscopy every 5 years or a colonoscopy every 10 years starting at age 34. Hepatitis C blood test. Hepatitis B blood test. Sexually transmitted disease (STD) testing. Diabetes screening. This is done by checking your blood sugar (glucose) after you have not eaten for a while (fasting). You may have this done every 1-3 years. Bone density scan. This is done to screen for osteoporosis. You may have this done starting at age 8. Mammogram. This may be done every 1-2 years. Talk to your health care provider about how often you should have regular mammograms. Talk with your health care provider about your  test results, treatment options, and if necessary, the need for more tests. Vaccines  Your health care provider may recommend certain vaccines, such as: Influenza vaccine. This is recommended every year. Tetanus, diphtheria, and acellular pertussis (Tdap, Td) vaccine. You may need a Td booster every 10 years. Zoster vaccine. You may need this after age 83. Pneumococcal 13-valent conjugate  (PCV13) vaccine. One dose is recommended after age 16. Pneumococcal polysaccharide (PPSV23) vaccine. One dose is recommended after age 18. Talk to your health care provider about which screenings and vaccines you need and how often you need them. This information is not intended to replace advice given to you by your health care provider. Make sure you discuss any questions you have with your health care provider. Document Released: 09/28/2015 Document Revised: 05/21/2016 Document Reviewed: 07/03/2015 Elsevier Interactive Patient Education  2017 [place] Prevention in the Home Falls can cause injuries. They can happen to people of all ages. There are many things you can do to make your home safe and to help prevent falls. What can I do on the outside of my home? Regularly fix the edges of walkways and driveways and fix any cracks. Remove anything that might make you trip as you walk through a door, such as a raised step or threshold. Trim any bushes or trees on the path to your home. Use bright outdoor lighting. Clear any walking paths of anything that might make someone trip, such as rocks or tools. Regularly check to see if handrails are loose or broken. Make sure that both sides of any steps have handrails. Any raised decks and porches should have guardrails on the edges. Have any leaves, snow, or ice cleared regularly. Use sand or salt on walking paths during winter. Clean up any spills in your garage right away. This includes oil or grease spills. What can I do in the bathroom? Use night lights. Install grab bars by the toilet and in the tub and shower. Do not use towel bars as grab bars. Use non-skid mats or decals in the tub or shower. If you need to sit down in the shower, use a plastic, non-slip stool. Keep the floor dry. Clean up any water that spills on the floor as soon as it happens. Remove soap buildup in the tub or shower regularly. Attach bath mats securely with  double-sided non-slip rug tape. Do not have throw rugs and other things on the floor that can make you trip. What can I do in the bedroom? Use night lights. Make sure that you have a light by your bed that is easy to reach. Do not use any sheets or blankets that are too big for your bed. They should not hang down onto the floor. Have a firm chair that has side arms. You can use this for support while you get dressed. Do not have throw rugs and other things on the floor that can make you trip. What can I do in the kitchen? Clean up any spills right away. Avoid walking on wet floors. Keep items that you use a lot in easy-to-reach places. If you need to reach something above you, use a strong step stool that has a grab bar. Keep electrical cords out of the way. Do not use floor polish or wax that makes floors slippery. If you must use wax, use non-skid floor wax. Do not have throw rugs and other things on the floor that can make you trip. What can I do with my  stairs? Do not leave any items on the stairs. Make sure that there are handrails on both sides of the stairs and use them. Fix handrails that are broken or loose. Make sure that handrails are as long as the stairways. Check any carpeting to make sure that it is firmly attached to the stairs. Fix any carpet that is loose or worn. Avoid having throw rugs at the top or bottom of the stairs. If you do have throw rugs, attach them to the floor with carpet tape. Make sure that you have a light switch at the top of the stairs and the bottom of the stairs. If you do not have them, ask someone to add them for you. What else can I do to help prevent falls? Wear shoes that: Do not have high heels. Have rubber bottoms. Are comfortable and fit you well. Are closed at the toe. Do not wear sandals. If you use a stepladder: Make sure that it is fully opened. Do not climb a closed stepladder. Make sure that both sides of the stepladder are locked  into place. Ask someone to hold it for you, if possible. Clearly mark and make sure that you can see: Any grab bars or handrails. First and last steps. Where the edge of each step is. Use tools that help you move around (mobility aids) if they are needed. These include: Canes. Walkers. Scooters. Crutches. Turn on the lights when you go into a dark area. Replace any light bulbs as soon as they burn out. Set up your furniture so you have a clear path. Avoid moving your furniture around. If any of your floors are uneven, fix them. If there are any pets around you, be aware of where they are. Review your medicines with your doctor. Some medicines can make you feel dizzy. This can increase your chance of falling. Ask your doctor what other things that you can do to help prevent falls. This information is not intended to replace advice given to you by your health care provider. Make sure you discuss any questions you have with your health care provider. Document Released: 06/28/2009 Document Revised: 02/07/2016 Document Reviewed: 10/06/2014 Elsevier Interactive Patient Education  2017 [employer].

## 2023-03-09 NOTE — Progress Notes (Signed)
Subjective:   ARYIANNA EARWOOD is a 82 y.o. female who presents for Medicare Annual (Subsequent) preventive examination.  Visit Complete: In person  Review of Systems     Cardiac Risk Factors include: advanced age (>66men, >20 women);obesity (BMI >30kg/m2);dyslipidemia;hypertension     Objective:    Today's Vitals   03/09/23 1541 03/09/23 1555  BP: (!) 148/86 120/84  Pulse: 87   Temp: 98 F (36.7 C)   SpO2: 97%   Weight: 185 lb 9.6 oz (84.2 kg)    Body mass index is 31.37 kg/m.     03/09/2023    3:49 PM 02/04/2022    1:00 PM 02/02/2022    3:00 PM 02/01/2022    3:24 PM 06/29/2020    6:38 PM 10/26/2018   11:29 AM 09/09/2018    3:10 PM  Advanced Directives  Does Patient Have a Medical Advance Directive? Yes Yes Yes Yes Yes Yes Yes  Type of Estate agent of [place];Living will Living will Living will Living will Healthcare Power of [place];Living will Healthcare Power of [place];Living will Healthcare Power of [place];Living will  Does patient want to make changes to medical advance directive? No - Patient declined No - Patient declined No - Patient declined    No - Patient declined  Copy of Healthcare Power of Attorney in Chart? Yes - validated most recent copy scanned in chart (See row information)     No - copy requested No - copy requested    Current Medications (verified) Outpatient Encounter Medications as of 03/09/2023  Medication Sig   amLODipine (NORVASC) 5 MG tablet Take 1 tablet (5 mg total) by mouth daily.   buPROPion (WELLBUTRIN XL) 150 MG 24 hr tablet Take 1 tablet (150 mg total) by mouth daily.   cholecalciferol (VITAMIN D3) 25 MCG (1000 UNIT) tablet Take 1,000 Units by mouth daily.   FLUoxetine (PROZAC) 20 MG capsule Take 1 capsule (20 mg total) by mouth daily.   hydrochlorothiazide (HYDRODIURIL) 12.5 MG tablet Take 1 tablet (12.5 mg total) by mouth daily.   levothyroxine (SYNTHROID) 100 MCG tablet Take 1 tablet (100 mcg total) by  mouth daily.   losartan (COZAAR) 100 MG tablet Take 1 tablet (100 mg total) by mouth daily. TAKE 1 TABLET BY MOUTH ONCE DAILY.   Multiple Vitamin (MULTIVITAMIN) capsule Take 1 capsule by mouth daily. Centrum Silver   Polyethyl Glycol-Propyl Glycol 0.4-0.3 % SOLN Place 2 drops into both eyes 2 (two) times daily.   No facility-administered encounter medications on file as of 03/09/2023.    Allergies (verified) Patient has no known allergies.   History: Past Medical History:  Diagnosis Date   ALLERGIC RHINITIS 11/30/2008   Anxiety    CAROTID BRUIT, LEFT 04/19/2007   ultrasound reassuring   Closed fracture of lateral malleolus 06/04/2009   CLOSED FRACTURE OF METATARSAL BONE 06/04/2009   Depression    Esophageal reflux 11/08/2008   GANGLION CYST 04/19/2007   HYPERTENSION 04/07/2007   Hypothyroidism    INSOMNIA 08/07/2010   OSTEOARTHRITIS 04/07/2007   OSTEOPENIA 03/10/2008   PONV (postoperative nausea and vomiting)    UTI 07/20/2009   Past Surgical History:  Procedure Laterality Date   EYE SURGERY Bilateral    cataract removal   FUNCTIONAL ENDOSCOPIC SINUS SURGERY  01/2018   spinal fluid leaking behind right eye   HIP PINNING,CANNULATED Right 02/02/2022   Procedure: CANNULATED HIP PINNING;  Surgeon: Ernestina Columbia, MD;  Location: [facility] OR;  Service: Orthopedics;  Laterality: Right;   JOINT  REPLACEMENT     knuckle   REVERSE SHOULDER ARTHROPLASTY Left 09/09/2018   Procedure: REVERSE SHOULDER ARTHROPLASTY;  Surgeon: Francena Hanly, MD;  Location: [facility] OR;  Service: Orthopedics;  Laterality: Left;    TOTAL HIP ARTHROPLASTY Left    TUBAL LIGATION     Family History  Problem Relation Age of Onset   Heart disease Mother        Mother also has significant carotid artery stenosis   Dementia Mother    COPD Father    Alcoholism Father    Alzheimer's disease Sister    Thyroid disease Sister        Goiter   COPD Sister    Lung cancer Brother        small cell carcinoma that  spread   Breast cancer Neg Hx    Social History   Socioeconomic History   Marital status: Married    Spouse name: Not on file   Number of children: Not on file   Years of education: Not on file   Highest education level: Not on file  Occupational History   Occupation: retired    Associate Professor: RETIRED  Tobacco Use   Smoking status: Former    Packs/day: 1.50    Years: 20.00    Additional pack years: 0.00    Total pack years: 30.00    Types: Cigarettes    Quit date: 09/15/1968    Years since quitting: 54.5   Smokeless tobacco: Never   Tobacco comments:    smoked 1.5 for  20 years  Vaping Use   Vaping Use: Never used  Substance and Sexual Activity   Alcohol use: No   Drug use: No   Sexual activity: Yes  Other Topics Concern   Not on file  Social History Narrative   Married 57  years in July 2023. 1 son. 2 grandkids, 1 step greatgrandkid.       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- hard with orthopedic issues   Social Determinants of Health   Financial Resource Strain: Low Risk  (03/09/2023)   Overall Financial Resource Strain (CARDIA)    Difficulty of Paying Living Expenses: Not hard at all  Food Insecurity: No Food Insecurity (03/09/2023)   Hunger Vital Sign    Worried About Running Out of Food in the Last Year: Never true    Ran Out of Food in the Last Year: Never true  Transportation Needs: No Transportation Needs (03/09/2023)   PRAPARE - Administrator, Civil Service (Medical): No    Lack of Transportation (Non-Medical): No  Physical Activity: Inactive (03/09/2023)   Exercise Vital Sign    Days of Exercise per Week: 0 days    Minutes of Exercise per Session: 0 min  Stress: No Stress Concern Present (03/09/2023)   [employer] of Occupational Health - Occupational Stress Questionnaire    Feeling of Stress : Not at  all  Social Connections: Moderately Integrated (03/09/2023)   Social Connection and Isolation Panel [NHANES]    Frequency of Communication with Friends and Family: More than three times a week    Frequency of Social Gatherings with Friends and Family: More than three times a week    Attends Religious Services: More than 4 times per year    Active Member of Clubs or Organizations: No  Attends Banker Meetings: Never    Marital Status: Married    Tobacco Counseling Counseling given: Not Answered Tobacco comments: smoked 1.5 for  20 years   Clinical Intake:  Pre-visit preparation completed: Yes  Pain : No/denies pain     BMI - recorded: 31.37 Nutritional Status: BMI > 30  Obese Nutritional Risks: None Diabetes: No  How often do you need to have someone help you when you read instructions, pamphlets, or other written materials from your doctor or pharmacy?: 1 - Never  Interpreter Needed?: No  Information entered by :: Lanier Ensign, LPN   Activities of Daily Living    03/09/2023    3:51 PM  In your present state of health, do you have any difficulty performing the following activities:  Hearing? 1  Comment hearing aids  Vision? 0  Difficulty concentrating or making decisions? 0  Walking or climbing stairs? 0  Dressing or bathing? 0  Doing errands, shopping? 0  Preparing Food and eating ? N  Using the Toilet? N  In the past six months, have you accidently leaked urine? N  Do you have problems with loss of bowel control? N  Managing your Medications? N  Managing your Finances? N  Housekeeping or managing your Housekeeping? N    Patient Care Team: Shelva Majestic, MD as PCP - General (Family Medicine)  Indicate any recent Medical Services you may have received from other than [facility] providers in the past year (date may be approximate).     Assessment:   This is a routine wellness examination for [place].  Hearing/Vision screen Hearing  Screening - Comments:: Pt has hearing aids  Vision Screening - Comments:: Pt unsure of provider   Dietary issues and exercise activities discussed:     Goals Addressed             This Visit's Progress    Patient Stated       Lose weight        Depression Screen    03/09/2023    3:47 PM 09/26/2022    1:09 PM 08/14/2022    2:55 PM 05/13/2022    9:53 AM 05/23/2016    9:37 AM 04/02/2016   10:29 AM 03/09/2015    9:10 AM  PHQ 2/9 Scores  PHQ - 2 Score 6 6 6 3 1  0 6  PHQ- 9 Score 18 19 18 12   21     Fall Risk    03/09/2023    3:50 PM 08/14/2022    2:27 PM 05/13/2022    9:54 AM 04/19/2018    9:44 AM 11/19/2016   10:47 AM  Fall Risk   Falls in the past year? 1 1 1  No Yes  Comment    Emmi Telephone Survey: data to providers prior to load   Number falls in past yr: 1 1 1  1   Injury with Fall? 1 1 1   Yes  Comment right hip FX      Risk for fall due to : History of fall(s);Impaired vision;Impaired mobility History of fall(s) History of fall(s)    Follow up Falls prevention discussed Falls evaluation completed Falls evaluation completed  Education provided    MEDICARE RISK AT HOME:  Medicare Risk at Home - 03/09/23 1553     Any stairs in or around the home? Yes    If so, are there any without handrails? No    Home free of loose throw rugs in walkways, pet beds, electrical cords,  etc? Yes    Adequate lighting in your home to reduce risk of falls? Yes    Life alert? No    Use of a cane, walker or w/c? No    Grab bars in the bathroom? Yes    Shower chair or bench in shower? Yes    Elevated toilet seat or a handicapped toilet? Yes             TIMED UP AND GO:  Was the test performed?  Yes  Length of time to ambulate 10 feet: 10 sec Gait steady and fast without use of assistive device    Cognitive Function:        03/09/2023    3:51 PM  6CIT Screen  What Year? 0 points  What month? 0 points  What time? 0 points  Count back from 20 0 points  Months in reverse 0  points  Repeat phrase 0 points  Total Score 0 points    Immunizations Immunization History  Administered Date(s) Administered   Influenza Split 07/17/2011, 06/30/2012   Influenza Whole 07/20/2007, 06/09/2008, 07/20/2009, 08/22/2010   Influenza, High Dose Seasonal PF 07/25/2013, 07/17/2015, 08/01/2016, 07/02/2017, 06/22/2018, 06/04/2021   Influenza,inj,Quad PF,6+ Mos 07/10/2014   Influenza,inj,quad, With Preservative 06/24/2018   Moderna Covid-19 Vaccine Bivalent Booster 81yrs & up 06/07/2022   Moderna Sars-Covid-2 Vaccination 11/30/2019, 12/28/2019   Pfizer Covid-19 Vaccine Bivalent Booster 34yrs & up 09/05/2020, 02/28/2021   Pneumococcal Conjugate-13 07/10/2014   Pneumococcal Polysaccharide-23 09/16/2003, 10/16/2011   Td 09/15/2005   Td (Adult), 2 Lf Tetanus Toxid, Preservative Free 09/15/2005   Tdap 12/04/2014, 06/29/2020   Zoster, Live 05/21/2016    TDAP status: Up to date  Flu Vaccine status: Up to date  Pneumococcal vaccine status: Up to date  Covid-19 vaccine status: Completed vaccines  Qualifies for Shingles Vaccine? Yes   Zostavax completed No   Shingrix Completed?: No.    Education has been provided regarding the importance of this vaccine. Patient has been advised to call insurance company to determine out of pocket expense if they have not yet received this vaccine. Advised may also receive vaccine at local pharmacy or Health Dept. Verbalized acceptance and understanding.  Screening Tests Health Maintenance  Topic Date Due   Zoster Vaccines- Shingrix (1 of 2) Never done   COVID-19 Vaccine (6 - 2023-24 season) 08/02/2022   INFLUENZA VACCINE  04/16/2023   Medicare Annual Wellness (AWV)  03/08/2024   DTaP/Tdap/Td (5 - Td or Tdap) 06/29/2030   Pneumonia Vaccine 29+ Years old  Completed   DEXA SCAN  Completed   HPV VACCINES  Aged Out    Health Maintenance  Health Maintenance Due  Topic Date Due   Zoster Vaccines- Shingrix (1 of 2) Never done   COVID-19  Vaccine (6 - 2023-24 season) 08/02/2022     Colorectal cancer screening: No longer required.   Mammogram status: No longer required due to age .  Bone Density status: Completed 03/04/18. Results reflect: Bone density results: OSTEOPENIA. Repeat every 2 years.   Additional Screening:  Vision Screening: Recommended annual ophthalmology exams for early detection of glaucoma and other disorders of the eye. Is the patient up to date with their annual eye exam?  No  Who is the provider or what is the name of the office in which the patient attends annual eye exams? Unsure of provider  If pt is not established with a provider, would they like to be referred to a provider to establish care? No .  Dental Screening: Recommended annual dental exams for proper oral hygiene    Community Resource Referral / Chronic Care Management: CRR required this visit?  No   CCM required this visit?  No     Plan:     I have personally reviewed and noted the following in the patient's chart:   Medical and social history Use of alcohol, tobacco or illicit drugs  Current medications and supplements including opioid prescriptions. Patient is not currently taking opioid prescriptions. Functional ability and status Nutritional status Physical activity Advanced directives List of other physicians Hospitalizations, surgeries, and ER visits in previous 12 months Vitals Screenings to include cognitive, depression, and falls Referrals and appointments  In addition, I have reviewed and discussed with patient certain preventive protocols, quality metrics, and best practice recommendations. A written personalized care plan for preventive services as well as general preventive health recommendations were provided to patient.     Marzella Schlein, LPN   1/32/4401   After Visit Summary: (MyChart) Due to this being a telephonic visit, the after visit summary with patients personalized plan was offered to  patient via MyChart   Nurse Notes: none

## 2023-04-13 ENCOUNTER — Encounter: Payer: Self-pay | Admitting: Family Medicine

## 2023-04-13 ENCOUNTER — Ambulatory Visit: Payer: Medicare Other | Admitting: Family Medicine

## 2023-04-13 VITALS — BP 130/78 | HR 85 | Temp 97.7°F | Ht 64.5 in | Wt 188.0 lb

## 2023-04-13 DIAGNOSIS — G8929 Other chronic pain: Secondary | ICD-10-CM

## 2023-04-13 DIAGNOSIS — M545 Low back pain, unspecified: Secondary | ICD-10-CM | POA: Diagnosis not present

## 2023-04-13 DIAGNOSIS — E890 Postprocedural hypothyroidism: Secondary | ICD-10-CM | POA: Diagnosis not present

## 2023-04-13 DIAGNOSIS — I10 Essential (primary) hypertension: Secondary | ICD-10-CM | POA: Diagnosis not present

## 2023-04-13 DIAGNOSIS — F324 Major depressive disorder, single episode, in partial remission: Secondary | ICD-10-CM | POA: Diagnosis not present

## 2023-04-13 DIAGNOSIS — Q019 Encephalocele, unspecified: Secondary | ICD-10-CM | POA: Diagnosis not present

## 2023-04-13 DIAGNOSIS — R413 Other amnesia: Secondary | ICD-10-CM

## 2023-04-13 MED ORDER — FLUOXETINE HCL 20 MG PO CAPS: 20.0000 mg | ORAL_CAPSULE | Freq: Every day | ORAL | 3 refills | Status: DC

## 2023-04-13 MED ORDER — BUPROPION HCL ER (XL) 300 MG PO TB24: 300.0000 mg | ORAL_TABLET | Freq: Every day | ORAL | 3 refills | Status: DC

## 2023-04-13 NOTE — Progress Notes (Signed)
Phone [phone] In person visit   Subjective:   [name] is a 82 y.o. year old very pleasant female patient who presents for/with See problem oriented charting Chief Complaint  Patient presents with   Depression    Past Medical History-  Patient Active Problem List   Diagnosis Date Noted   Memory loss 09/26/2022    Priority: High   Depression with anxiety 10/28/2010    Priority: High   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HCC) 05/13/2022    Priority: Medium    History of hip fracture 02/04/2022    Priority: Medium    Hypothyroidism, postop 11/19/2016    Priority: Medium    Attention deficit disorder 07/10/2014    Priority: Medium    OSTEOPENIA 03/10/2008    Priority: Medium    Hyperlipidemia 04/19/2007    Priority: Medium    Essential hypertension 04/07/2007    Priority: Medium    GERD (gastroesophageal reflux disease) 11/17/2012    Priority: Low   INSOMNIA 08/07/2010    Priority: Low   ALLERGIC RHINITIS 11/30/2008    Priority: Low   S/P reverse total shoulder arthroplasty, 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   Cyst in hand 08/23/2014    Priority: 1.   Osteoarthritis 04/07/2007    Priority: 1.   Depression, major, single episode, in partial remission (HCC) 04/13/2023    Medications- reviewed and updated Current Outpatient Medications  Medication Sig Dispense Refill   amLODipine (NORVASC) 5 MG tablet Take 1 tablet (5 mg total) by mouth daily. 90 tablet 3   buPROPion (WELLBUTRIN XL) 150 MG 24 hr tablet Take 1 tablet (150 mg total) by mouth daily. 90 tablet 3   cholecalciferol (VITAMIN D3) 25 MCG (1000 UNIT) tablet Take 1,000 Units by mouth daily.     FLUoxetine (PROZAC) 20 MG capsule Take 1 capsule (20 mg total) by mouth daily. 30 capsule 5   hydrochlorothiazide (HYDRODIURIL) 12.5 MG tablet Take 1 tablet (12.5 mg total) by mouth daily. 90 tablet 3   levothyroxine (SYNTHROID) 100 MCG tablet Take 1 tablet (100 mcg total) by mouth daily. 30 tablet 5   losartan (COZAAR) 100 MG  tablet Take 1 tablet (100 mg total) by mouth daily. TAKE 1 TABLET BY MOUTH ONCE DAILY. 90 tablet 3   Multiple Vitamin (MULTIVITAMIN) capsule Take 1 capsule by mouth daily. Centrum Silver     Polyethyl Glycol-Propyl Glycol 0.4-0.3 % SOLN Place 2 drops into both eyes 2 (two) times daily.     No current facility-administered medications for this visit.     Objective:  BP 130/78   Pulse 85   Temp 97.7 F (36.5 C)   Ht 5' 4.5" (1.638 m)   Wt 188 lb (85.3 kg)   SpO2 96%   BMI 31.77 kg/m  Gen: NAD, resting comfortably CV: RRR no murmurs rubs or gallops Lungs: CTAB no crackles, wheeze, rhonchi Ext: no edema Skin: warm, dry    Assessment and Plan    # Memory loss S:over a year of issues reported in august 2023- increased forgetfullness  A/P: Ongoing issues-had planned on MRI of the brain but she reports has not completed this-reports has not heard about it.  I was going to have my team follow-up with her and also gave her the number to call x-ray imaging directly-also related to this concern for Relative B12-in the low 200s 02/13/2023-we recommended daily B12 for a month and then once a week 1000 mcg-she reports that unfortunately she never took this medication-encouraged again today  through after visit summary - She would prefer to be alone at visits but I noted the following "You are ultimately the boss but I would strongly prefer your husband in the visit to be backup for [place] in the conversation"-ultimately we have to respect her decisions\ -We also want MRI to follow-up on prior cephalocele that reportedly was surgically corrected  #hypertension S: medication: Amlodipine 5 mg- had been on 10 in past, hydrochlorothiazide 12.5 mg, losartan 100 mg BP Readings from Last 3 Encounters:  04/13/23 130/78  03/09/23 120/84  02/13/23 (!) 148/92  A/P: Controlled. Continue current medications.  #hypothyroidism S: compliant On thyroid medication-levothyroxine 100 mcg Lab Results  Component  Value Date   TSH 4.46 02/13/2023  A/P: Controlled. Continue current medications.   # Depression S: Medication:Lexapro 10 mg--> pristiq 25 mg--> fluoxetine 20 mg, Wellbutrin 300 mg extended release--> 150 mg    04/13/2023    2:54 PM 03/09/2023    3:47 PM 09/26/2022    1:09 PM  Depression screen PHQ 2/9  Decreased Interest 0 3 3  Down, Depressed, Hopeless 3 3 3   PHQ - 2 Score 3 6 6   Altered sleeping 2 3 3   Tired, decreased energy 0 3 3  Change in appetite 0 0 1  Feeling bad or failure about yourself  3 3 3   Trouble concentrating 3 3 3   Moving slowly or fidgety/restless 0 0 0  Suicidal thoughts 0 0 0  PHQ-9 Score 11 18 19   Difficult doing work/chores Somewhat difficult  Somewhat difficult  A/P: Appears to be improving but still poorly controlled-she believes she is on fluoxetine 20 mg and Wellbutrin 150 mg.  She had been off of Wellbutrin completely from prior 30 mg dose and we opted to work her back towards that last visit.  Since she is improving on fluoxetine 20 mg Wellbutrin 150 mg we increase to Wellbutrin 30 mg along with continued fluoxetine today at 20 mg.  Recommended follow up: Return in about 6 weeks (around 05/25/2023) for followup or sooner if needed.Schedule b4 you leave. Future Appointments  Date Time Provider Department Center  05/26/2023  3:00 PM Shelva Majestic, MD [facility]  03/14/2024  2:00 PM [facility] ANNUAL WELLNESS VISIT 1 [facility]   Lab/Order associations:   ICD-10-CM   1. Chronic bilateral low back pain without sciatica  M54.50 Ambulatory referral to Sports Medicine   G89.29     2. Memory loss  R41.3     3. Depression, major, single episode, in partial remission (HCC)  F32.4     4. 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HCC) Chronic Q01.9     5. Essential hypertension  I10     6. Hypothyroidism, postop  E89.0      Meds ordered this encounter  Medications   buPROPion (WELLBUTRIN XL) 300 MG 24 hr tablet    Sig: Take 1 tablet (300 mg total) by mouth daily.    Dispense:   90 tablet    Refill:  3   FLUoxetine (PROZAC) 20 MG capsule    Sig: Take 1 capsule (20 mg total) by mouth daily.    Dispense:  90 capsule    Refill:  3   Return precautions advised.  Tana Conch, MD

## 2023-04-13 NOTE — Patient Instructions (Addendum)
You are ultimately the boss but I would strongly prefer your husband in the visit to be backup for [place] in the conversation  You believed you were on the following prior to today's visit and agreed to confirm: Fluoxetine 20 mg (which is Prozac) Wellbutrin 150 mg  After today you should start  taking Fluoxetine 20 mg (which is Prozac)- resent today Wellbutrin 300 mg-resent today  We were also concerned your B12 was low normal and could contribute to memory issues I recommended the following and I will reocmmend again today B12 is low normal.  I prefer for this level to be over 400 if possible.  Reasonable to take 1000 mcg of B12/cyanocobalamin over-the-counter daily on an ongoing basis  We also ordered an MRI last visit of your brain for memory loss- our team is checking on status but probably the easiest thing is to just call [facility] directly [facility] contact Please call to schedule visit and/or procedure Address: [address] [place], [place], [place] [address] Phone: [phone]   #Right low back pain- occurred after a fall a year ago but continues to bother her- we opted to refer to sports medicine with long term pain We have placed a referral for you today to sports medicine . In some cases you will see # listed below- you can call this if you have not heard within a week. If you do not see # listed- you should receive a mychart message or phone call within a week with the # to call. Reach out to [place] if you ar enot scheduled within 2 weeks   Recommended follow up: Return in about 6 weeks (around 05/25/2023) for followup or sooner if needed.Schedule b4 you leave.

## 2023-04-21 ENCOUNTER — Telehealth: Payer: Self-pay | Admitting: Family Medicine

## 2023-04-21 ENCOUNTER — Ambulatory Visit (INDEPENDENT_AMBULATORY_CARE_PROVIDER_SITE_OTHER): Payer: Medicare Other | Admitting: Family Medicine

## 2023-04-21 ENCOUNTER — Ambulatory Visit (INDEPENDENT_AMBULATORY_CARE_PROVIDER_SITE_OTHER): Payer: Medicare Other

## 2023-04-21 ENCOUNTER — Encounter: Payer: Self-pay | Admitting: Family Medicine

## 2023-04-21 VITALS — BP 144/90 | HR 84 | Ht 64.5 in | Wt 186.4 lb

## 2023-04-21 DIAGNOSIS — S32010A Wedge compression fracture of first lumbar vertebra, initial encounter for closed fracture: Secondary | ICD-10-CM | POA: Diagnosis not present

## 2023-04-21 DIAGNOSIS — M545 Low back pain, unspecified: Secondary | ICD-10-CM

## 2023-04-21 DIAGNOSIS — M5441 Lumbago with sciatica, right side: Secondary | ICD-10-CM

## 2023-04-21 DIAGNOSIS — M47816 Spondylosis without myelopathy or radiculopathy, lumbar region: Secondary | ICD-10-CM | POA: Diagnosis not present

## 2023-04-21 DIAGNOSIS — M5442 Lumbago with sciatica, left side: Secondary | ICD-10-CM | POA: Diagnosis not present

## 2023-04-21 DIAGNOSIS — M438X6 Other specified deforming dorsopathies, lumbar region: Secondary | ICD-10-CM | POA: Diagnosis not present

## 2023-04-21 DIAGNOSIS — G8929 Other chronic pain: Secondary | ICD-10-CM

## 2023-04-21 DIAGNOSIS — M4856XA Collapsed vertebra, not elsewhere classified, lumbar region, initial encounter for fracture: Secondary | ICD-10-CM | POA: Diagnosis not present

## 2023-04-21 NOTE — Progress Notes (Signed)
   I, Stevenson Clinch, CMA acting as a scribe for [name], MD.  [name] is a 82 y.o. female who presents to [facility] at [facility] today for LBP x 1 year, started after a fall. She suffered a fall about a year ago. Pt locates pain to midline and bilateral lower back pain. Pain worse when standing, improves with sitting or laying down. Occasional hip pain and leg pain. Denies sharp shooting pain, more soreness in the legs.   Radiating pain: hips LE numbness/tingling: no LE weakness: yes Aggravates: standing Treatments tried: Advil  Pertinent review of systems: No fevers or chills  Relevant historical information: Hypertension.  Hard of hearing   Exam:  BP (!) 144/90   Pulse 84   Ht 5' 4.5" (1.638 m)   Wt 186 lb 6.4 oz (84.6 kg)   SpO2 96%   BMI 31.50 kg/m  General: Well Developed, well nourished, and in no acute distress.   MSK: L-spine: Normal appearing Nontender palpation spinal midline. Normal lumbar motion. Lower extremity strength and reflexes are intact distally.    Lab and Radiology Results  X-ray images lumbar spine obtained today personally and independently interpreted DDD throughout lumbar spine worse at L5-S1 and L4-L5. L1 vertebrae is wedge-shaped concerning for compression fracture. Await formal radiology review    Assessment and Plan: 82 y.o. female with chronic low back pain.  Pain is predominantly located in the lower portion lumbar spine.  I think this is primarily due to degenerative changes and muscle dysfunction.  She does have what looks to be a compression fracture at L1 which I think is going to be chronic.  Plan for physical therapy.  However since I do see what looks like a compression fracture we will go ahead and order a MRI lumbar spine to better characterize the acuity of the compression fracture.   PDMP not reviewed this encounter. Orders Placed This Encounter  Procedures   DG Lumbar Spine 2-3 Views     Standing Status:   Future    Number of Occurrences:   1    Standing Expiration Date:   05/22/2023    Order Specific Question:   Reason for Exam (SYMPTOM  OR DIAGNOSIS REQUIRED)    Answer:   low back pain    Order Specific Question:   Preferred imaging location?    Answer:   [facility]   Ambulatory referral to Physical Therapy    Referral Priority:   Routine    Referral Type:   Physical Medicine    Referral Reason:   Specialty Services Required    Requested Specialty:   Physical Therapy    Number of Visits Requested:   1   No orders of the defined types were placed in this encounter.    Discussed warning signs or symptoms. Please see discharge instructions. Patient expresses understanding.   The above documentation has been reviewed and is accurate and complete [name], M.D.

## 2023-04-21 NOTE — Telephone Encounter (Signed)
Please inform [name] that I am worried about a compression fracture in her lumbar spine.  This is at L1 which is higher than where her pain is coming from and I think it is probably old.  However I do think getting her an MRI to better evaluate this compression fracture is a good idea.  I have ordered an MRI.  Okay to start going to physical therapy before we get the MRI results back.

## 2023-04-21 NOTE — Telephone Encounter (Signed)
Called both phone numbers again and no answer.

## 2023-04-21 NOTE — Telephone Encounter (Signed)
Tried to call both numbers listed within pt's chart x 4 w/ no answer. Left a VM asking that she call us back. Will cont to try to reach her.

## 2023-04-21 NOTE — Patient Instructions (Addendum)
Thank you for coming in today.   Please get an Xray today before you leave   I've referred you to Physical Therapy.  Let us know if you don't hear from them in one week.   Check back in 6 weeks 

## 2023-04-29 ENCOUNTER — Other Ambulatory Visit: Payer: Medicare Other

## 2023-04-29 ENCOUNTER — Ambulatory Visit: Admission: RE | Admit: 2023-04-29 | Payer: Medicare Other | Source: Ambulatory Visit

## 2023-04-29 DIAGNOSIS — M545 Low back pain, unspecified: Secondary | ICD-10-CM

## 2023-04-29 DIAGNOSIS — M47816 Spondylosis without myelopathy or radiculopathy, lumbar region: Secondary | ICD-10-CM | POA: Diagnosis not present

## 2023-04-29 DIAGNOSIS — M47817 Spondylosis without myelopathy or radiculopathy, lumbosacral region: Secondary | ICD-10-CM | POA: Diagnosis not present

## 2023-04-30 ENCOUNTER — Encounter (INDEPENDENT_AMBULATORY_CARE_PROVIDER_SITE_OTHER): Payer: Self-pay

## 2023-05-04 ENCOUNTER — Ambulatory Visit (HOSPITAL_COMMUNITY)
Admission: RE | Admit: 2023-05-04 | Discharge: 2023-05-04 | Disposition: A | Discharge status: Home / Self Care | Payer: Medicare Other | Source: Ambulatory Visit | Attending: Family Medicine | Admitting: Family Medicine

## 2023-05-04 DIAGNOSIS — R413 Other amnesia: Secondary | ICD-10-CM | POA: Insufficient documentation

## 2023-05-04 DIAGNOSIS — R9089 Other abnormal findings on diagnostic imaging of central nervous system: Secondary | ICD-10-CM | POA: Diagnosis not present

## 2023-05-04 DIAGNOSIS — G9389 Other specified disorders of brain: Secondary | ICD-10-CM | POA: Diagnosis not present

## 2023-05-04 DIAGNOSIS — I6782 Cerebral ischemia: Secondary | ICD-10-CM | POA: Diagnosis not present

## 2023-05-04 DIAGNOSIS — Q019 Encephalocele, unspecified: Secondary | ICD-10-CM | POA: Diagnosis not present

## 2023-05-04 NOTE — Progress Notes (Signed)
Lumbar spine x-ray shows likely a old compression fracture at L1. You had your MRI but has not been read yet.  That will help [place] know how old the compression fracture is.  In the meantime please proceed to physical therapy.

## 2023-05-12 ENCOUNTER — Other Ambulatory Visit: Payer: Self-pay | Admitting: Family Medicine

## 2023-05-12 NOTE — Progress Notes (Signed)
Lumbar spine MRI does not show a compression fracture.  MRI is more accurate than x-ray.  We do see arthritis and areas where nerves could potentially be pinched.  At your last visit I recommended that you started physical therapy.  Have you scheduled physical therapy yet?  Additionally your brain MRI that was done just recently has not been read yet.  Please let me know when you get the report back so I can go over the results with you.

## 2023-05-15 ENCOUNTER — Other Ambulatory Visit: Payer: Self-pay

## 2023-05-15 MED ORDER — LEVOTHYROXINE SODIUM 100 MCG PO TABS: 100.0000 ug | ORAL_TABLET | Freq: Every day | ORAL | 5 refills | Status: DC

## 2023-05-26 ENCOUNTER — Encounter: Payer: Self-pay | Admitting: Family Medicine

## 2023-05-26 ENCOUNTER — Ambulatory Visit (INDEPENDENT_AMBULATORY_CARE_PROVIDER_SITE_OTHER): Payer: Medicare Other | Admitting: Family Medicine

## 2023-05-26 VITALS — BP 160/88 | HR 84 | Temp 97.6°F | Ht 64.5 in | Wt 187.6 lb

## 2023-05-26 DIAGNOSIS — R413 Other amnesia: Secondary | ICD-10-CM | POA: Diagnosis not present

## 2023-05-26 DIAGNOSIS — F418 Other specified anxiety disorders: Secondary | ICD-10-CM | POA: Diagnosis not present

## 2023-05-26 DIAGNOSIS — Z23 Encounter for immunization: Secondary | ICD-10-CM | POA: Diagnosis not present

## 2023-05-26 DIAGNOSIS — I10 Essential (primary) hypertension: Secondary | ICD-10-CM | POA: Diagnosis not present

## 2023-05-26 MED ORDER — AMLODIPINE BESYLATE 10 MG PO TABS: 10.0000 mg | ORAL_TABLET | Freq: Every day | ORAL | 3 refills | Status: DC

## 2023-05-26 NOTE — Progress Notes (Signed)
Phone [phone] In person visit   Subjective:   [name] is a 82 y.o. year old very pleasant female patient who presents for/with See problem oriented charting Chief Complaint  Patient presents with   Depression   Memory Loss   Past Medical History-  Patient Active Problem List   Diagnosis Date Noted   Memory loss 09/26/2022    Priority: High   Depression with anxiety 10/28/2010    Priority: High   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HCC) 05/13/2022    Priority: Medium    History of hip fracture 02/04/2022    Priority: Medium    Hypothyroidism, postop 11/19/2016    Priority: Medium    Attention deficit disorder 07/10/2014    Priority: Medium    OSTEOPENIA 03/10/2008    Priority: Medium    Hyperlipidemia 04/19/2007    Priority: Medium    Essential hypertension 04/07/2007    Priority: Medium    GERD (gastroesophageal reflux disease) 11/17/2012    Priority: Low   INSOMNIA 08/07/2010    Priority: Low   ALLERGIC RHINITIS 11/30/2008    Priority: Low   S/P reverse total shoulder arthroplasty, 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   Cyst in hand 08/23/2014    Priority: 1.   Osteoarthritis 04/07/2007    Priority: 1.   Depression, major, single episode, in partial remission (HCC) 04/13/2023    Medications- reviewed and updated Current Outpatient Medications  Medication Sig Dispense Refill   buPROPion (WELLBUTRIN XL) 300 MG 24 hr tablet Take 1 tablet (300 mg total) by mouth daily. 90 tablet 3   cholecalciferol (VITAMIN D3) 25 MCG (1000 UNIT) tablet Take 1,000 Units by mouth daily.     cyanocobalamin 1000 MCG tablet Take 1,000 mcg by mouth daily. New start 04/13/23- to help with memory     FLUoxetine (PROZAC) 20 MG capsule Take 1 capsule (20 mg total) by mouth daily. 90 capsule 3   hydrochlorothiazide (HYDRODIURIL) 12.5 MG tablet Take 1 tablet (12.5 mg total) by mouth daily. 90 tablet 3   levothyroxine (SYNTHROID) 100 MCG tablet Take 1 tablet (100 mcg total) by mouth daily. 30 tablet 5    losartan (COZAAR) 100 MG tablet Take 1 tablet (100 mg total) by mouth daily. TAKE 1 TABLET BY MOUTH ONCE DAILY. 90 tablet 3   Multiple Vitamin (MULTIVITAMIN) capsule Take 1 capsule by mouth daily. Centrum Silver     Polyethyl Glycol-Propyl Glycol 0.4-0.3 % SOLN Place 2 drops into both eyes 2 (two) times daily.     amLODipine (NORVASC) 10 MG tablet Take 1 tablet (10 mg total) by mouth daily. 90 tablet 3   No current facility-administered medications for this visit.     Objective:  BP (!) 160/88   Pulse 84   Temp 97.6 F (36.4 C)   Ht 5' 4.5" (1.638 m)   Wt 187 lb 9.6 oz (85.1 kg)   SpO2 98%   BMI 31.70 kg/m  Gen: NAD, resting comfortably CV: RRR no murmurs rubs or gallops Lungs: CTAB no crackles, wheeze, rhonchi Abdomen: soft/nontender/nondistended/normal bowel sounds. No rebound or guarding.  Ext: no edema Skin: warm, dry     Assessment and Plan   # Right low back pain-occurred after a fall over a year ago but continued to bother her at last visit and we refer to sports medicine-they noted a compression fracture at L1 likely chronic-referred to physical therapy-did an MRI of the lumbar spine to better characterize the compression fracture-thankfully MRI did not show a compression fracture  but did show some arthritic changes and some disc bulges -has upcoming appointment on the 17th for first physical therapy session at [facility]   # Memory loss S:over a year of issues reported in august 2023- increased forgetfullness  -MRI reassuring 05/04/23 including stability of prior cephalocele -Did have a notably low B12 in the low 200s 02/13/2023 we recommended daily B12 for a month and then once a week once again at 04/13/2023 visit  -Have encouraged patient's husband to come to visits if patient is agreeable  A/P: ongoing memory loss issues- MRI reassuring. Taking B12 now- refer to neurology for their expert opinoin   #hypertension S: medication: Amlodipine 5 mg- had been on 10 in past,  hydrochlorothiazide 12.5 mg, losartan 100 mg Home readings #s: has cuff but no recent checks BP Readings from Last 3 Encounters:  05/26/23 (!) 160/88  04/21/23 (!) 144/90  04/13/23 130/78  A/P: blood pressure again above goal today- lets go back to amlodipine 10 mg and update me in 10 days with home readings. Continue hydrochlorothiazide 12.5 mg and losartan 100 mg   # Depression S: Medication:Lexapro 10 mg--> pristiq 25 mg--> fluoxetine 20 mg, Wellbutrin 300 mg extended release    05/26/2023    2:36 PM 05/26/2023    2:31 PM 04/13/2023    2:54 PM  Depression screen PHQ 2/9  Decreased Interest 3 0 0  Down, Depressed, Hopeless 1 0 3  PHQ - 2 Score 4 0 3  Altered sleeping 1 0 2  Tired, decreased energy 3 0 0  Change in appetite 0 0 0  Feeling bad or failure about yourself  3 0 3  Trouble concentrating 1 0 3  Moving slowly or fidgety/restless 0 0 0  Suicidal thoughts 0 0 0  PHQ-9 Score 12 0 11  Difficult doing work/chores Very difficult Not difficult at all Somewhat difficult   A/P: patient reports a lot of stress worried about MRI of brain and back- she is reassured by both findings- wants to hold off on further medicine change as she feels she will likely do better soon. I asked them to update me with how she is doing in 10 days as well for depression when they call about blood pressure    Recommended follow up: Return in about 2 months (around 07/26/2023) for followup or sooner if needed.Schedule b4 you leave. Future Appointments  Date Time Provider Department Center  05/28/2023  2:30 PM Benjie Karvonen April Ma L, PT [facility] None  06/02/2023  3:00 PM Rodolph Bong, MD [facility] None  03/14/2024  2:00 PM [facility] ANNUAL WELLNESS VISIT 1 [facility]   Lab/Order associations:   ICD-10-CM   1. Memory loss  R41.3 Ambulatory referral to Neurology    2. Encounter for immunization  Z23 Flu Vaccine Trivalent High Dose (Fluad)    3. Depression with anxiety  F41.8     4. Essential  hypertension  I10       Meds ordered this encounter  Medications   amLODipine (NORVASC) 10 MG tablet    Sig: Take 1 tablet (10 mg total) by mouth daily.    Dispense:  90 tablet    Refill:  3    Discontinue 5 mg script    Return precautions advised.  Tana Conch, MD

## 2023-05-26 NOTE — Patient Instructions (Addendum)
blood pressure again above goal today- lets go back to amlodipine 10 mg and update me in 10 days with home readings. Continue hydrochlorothiazide 12.5 mg and losartan 100 mg  I asked them to update me with how she is doing in 10 days as well for depression when they call about blood pressure    We have placed a referral for you today to neurology. In some cases you will see # listed below- you can call this if you have not heard within a week. If you do not see # listed- you should receive a mychart message or phone call within a week with the # to call directly- call that as soon as you get it. If you are having issues getting scheduled reach out to [place] again.    Recommended follow up: Return in about 2 months (around 07/26/2023) for followup or sooner if needed.Schedule b4 you leave. To recheck blood pressure and hopefully you have seen neurology by then

## 2023-05-27 ENCOUNTER — Encounter: Payer: Self-pay | Admitting: Physician Assistant

## 2023-05-28 ENCOUNTER — Other Ambulatory Visit: Payer: Self-pay

## 2023-05-28 ENCOUNTER — Ambulatory Visit (HOSPITAL_COMMUNITY): Payer: Medicare Other | Attending: Family Medicine | Admitting: Physical Therapy

## 2023-05-28 DIAGNOSIS — R262 Difficulty in walking, not elsewhere classified: Secondary | ICD-10-CM | POA: Diagnosis not present

## 2023-05-28 DIAGNOSIS — G8929 Other chronic pain: Secondary | ICD-10-CM | POA: Diagnosis not present

## 2023-05-28 DIAGNOSIS — M6281 Muscle weakness (generalized): Secondary | ICD-10-CM | POA: Diagnosis not present

## 2023-05-28 DIAGNOSIS — M545 Low back pain, unspecified: Secondary | ICD-10-CM | POA: Insufficient documentation

## 2023-05-28 DIAGNOSIS — R293 Abnormal posture: Secondary | ICD-10-CM | POA: Insufficient documentation

## 2023-05-28 NOTE — Therapy (Signed)
OUTPATIENT PHYSICAL THERAPY THORACOLUMBAR EVALUATION   Patient Name: [name] MRN: [id number] DOB:10-19-1940, 82 y.o., female Today's Date: 05/28/2023  END OF SESSION:  PT End of Session - 05/28/23 1445     Visit Number 1    Number of Visits 12    Date for PT Re-Evaluation 07/09/23    Authorization Type Medicare    Progress Note Due on Visit 10    PT Start Time 1445    PT Stop Time 1530    PT Time Calculation (min) 45 min             Past Medical History:  Diagnosis Date   ALLERGIC RHINITIS 11/30/2008   Anxiety    CAROTID BRUIT, LEFT 04/19/2007   ultrasound reassuring   Closed fracture of lateral malleolus 06/04/2009   CLOSED FRACTURE OF METATARSAL BONE 06/04/2009   Depression    Esophageal reflux 11/08/2008   GANGLION CYST 04/19/2007   HYPERTENSION 04/07/2007   Hypothyroidism    INSOMNIA 08/07/2010   OSTEOARTHRITIS 04/07/2007   OSTEOPENIA 03/10/2008   PONV (postoperative nausea and vomiting)    UTI 07/20/2009   Past Surgical History:  Procedure Laterality Date   EYE SURGERY Bilateral    cataract removal   FUNCTIONAL ENDOSCOPIC SINUS SURGERY  01/2018   spinal fluid leaking behind right eye   HIP PINNING,CANNULATED Right 02/02/2022   Procedure: CANNULATED HIP PINNING;  Surgeon: Ernestina Columbia, MD;  Location: [facility] OR;  Service: Orthopedics;  Laterality: Right;   JOINT REPLACEMENT     knuckle   REVERSE SHOULDER ARTHROPLASTY Left 09/09/2018   Procedure: REVERSE SHOULDER ARTHROPLASTY;  Surgeon: Francena Hanly, MD;  Location: [facility] OR;  Service: Orthopedics;  Laterality: Left;    TOTAL HIP ARTHROPLASTY Left    TUBAL LIGATION     Patient Active Problem List   Diagnosis Date Noted   Depression, major, single episode, in partial remission (HCC) 04/13/2023   Memory loss 09/26/2022   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HCC) 05/13/2022   History of hip fracture 02/04/2022   S/P reverse total shoulder arthroplasty, left 09/09/2018   Hypothyroidism, postop 11/19/2016   Cyst in  hand 08/23/2014   Attention deficit disorder 07/10/2014   GERD (gastroesophageal reflux disease) 11/17/2012   Depression with anxiety 10/28/2010   INSOMNIA 08/07/2010   ALLERGIC RHINITIS 11/30/2008   OSTEOPENIA 03/10/2008   Hyperlipidemia 04/19/2007   Essential hypertension 04/07/2007   Osteoarthritis 04/07/2007    PCP: Shelva Majestic, MD  REFERRING PROVIDER: Rodolph Bong, MD  REFERRING DIAG: M54.42,M54.41,G89.29 (ICD-10-CM) - Chronic bilateral low back pain with bilateral sciatica  Rationale for Evaluation and Treatment: Rehabilitation  THERAPY DIAG:  Muscle weakness (generalized)  Abnormal posture  Difficulty in walking, not elsewhere classified  ONSET DATE: ~1 year ago  SUBJECTIVE:  SUBJECTIVE STATEMENT: Pt states she fell ~1 year ago and hurt right below her waist in the center. It doesn't hurt while sitting. It hurts while standing. Can only walk ~10 min before she has to sit down. Can't finish washing dishes before having to sit.   PERTINENT HISTORY:  L THA, "brain leak" and had surgery to close it up  PAIN:  Are you having pain? Yes: NPRS scale: 8 while standing ; 0 while sitting/10 Pain location: center right below waist line Pain description: achy Aggravating factors: Prolonged standing activities/walking Relieving factors: sitting  PRECAUTIONS: None  RED FLAGS: None   WEIGHT BEARING RESTRICTIONS: No  FALLS:  Has patient fallen in last 6 months? No  LIVING ENVIRONMENT: Lives with: lives with their spouse Lives in: House/apartment Stairs: Yes: External: 1 steps; none Has following equipment at home: None  OCCUPATION: Retired; play cards and visit friends  PLOF: Independent  PATIENT GOALS: Be able to stand with less pain for a period of time (~30 min)  NEXT  MD VISIT: n/a  OBJECTIVE:   DIAGNOSTIC FINDINGS:  IMPRESSION: 1. Moderate compression fracture of L1 of unclear chronicity. This may have occurred 1 year ago consistent with the history of a fall. 2. No other fractures. 3. Degenerative changes as detailed.     Electronically Signed   By: Amie Portland M.D.   On: 04/27/2023 16:11  PATIENT SURVEYS:  FOTO 40; predicted 52  SCREENING FOR RED FLAGS: Bowel or bladder incontinence: No Spinal tumors: No Cauda equina syndrome: No Compression fracture: No Abdominal aneurysm: No  COGNITION: Overall cognitive status: Within functional limits for tasks assessed     SENSATION: WFL  MUSCLE LENGTH: Hamstrings: Right 80 deg; Left 80 deg Thomas test: Right 0 deg; Left 0 deg  POSTURE: anterior pelvic tilt  PALPATION: Hypomobile with SI PA mobs and lumbosacral PA mobs No other gross muscular tightness or tenderness noted  LUMBAR ROM:   AROM eval  Flexion 2" above floor  Extension 100%  Right lateral flexion 100%  Left lateral flexion 100%  Right rotation 100%  Left rotation 100%   (Blank rows = not tested)  LOWER EXTREMITY ROM:     Active  Right eval Left eval  Hip flexion    Hip extension 5 5  Hip abduction    Hip adduction    Hip internal rotation    Hip external rotation    Knee flexion    Knee extension    Ankle dorsiflexion    Ankle plantarflexion    Ankle inversion    Ankle eversion     (Blank rows = not tested)  LOWER EXTREMITY MMT:    MMT Right eval Left eval  Hip flexion 5 5  Hip extension 3+ 3+  Hip abduction 3+ 3+  Hip adduction    Hip internal rotation    Hip external rotation    Knee flexion 5 5  Knee extension 5 5  Ankle dorsiflexion    Ankle plantarflexion    Ankle inversion    Ankle eversion     (Blank rows = not tested)  LUMBAR SPECIAL TESTS:  Straight leg raise test: Negative  FUNCTIONAL TESTS:  5 times sit to stand: 33.25 sec  GAIT: Distance walked: Into  clinic Assistive device utilized: None Level of assistance: Complete Independence Comments: Forward flexed posture, anterior pelvic tilt noted, diminished hip extension bilat  TODAY'S TREATMENT:  DATE:  05/28/23 See HEP below   PATIENT EDUCATION:  Education details: Exam findings, POC, initial HEP Person educated: Patient Education method: Explanation, Demonstration, and Handouts Education comprehension: verbalized understanding, returned demonstration, and needs further education  HOME EXERCISE PROGRAM: Access Code: [email or web address] URL: [email or web address] Date: 05/28/2023 Prepared by: Vernon Prey April Kirstie Peri  Exercises - Sidelying Hip Abduction  - 1 x daily - 7 x weekly - 2 sets - 10 reps - Supine Bridge  - 1 x daily - 7 x weekly - 2 sets - 10 reps - 5 sec hold - Supine Posterior Pelvic Tilt  - 1 x daily - 7 x weekly - 2 sets - 10 reps - Standing Gluteal Sets  - 1 x daily - 7 x weekly - 2 sets - 10 reps - 3 sec hold  ASSESSMENT:  CLINICAL IMPRESSION: Patient is an 82 y.o. F who was seen today for physical therapy evaluation and treatment for low back pain. Assessment significant for gross glute weakness and lumbo/sacropelvic hypomobility leading to forward flexed posture, decreased stability and increased pain with standing and walking activities limiting home and community tasks. Pt will highly benefit from PT to address these deficits for improved level of function.   OBJECTIVE IMPAIRMENTS: Abnormal gait, decreased activity tolerance, decreased balance, decreased endurance, decreased mobility, difficulty walking, decreased ROM, decreased strength, hypomobility, impaired flexibility, postural dysfunction, and pain.   ACTIVITY LIMITATIONS: bending, standing, transfers, and locomotion level  PARTICIPATION LIMITATIONS: meal prep, cleaning,  laundry, shopping, community activity, and yard work  PERSONAL FACTORS: Age, Fitness, Past/current experiences, and Time since onset of injury/illness/exacerbation are also affecting patient's functional outcome.   REHAB POTENTIAL: Good  CLINICAL DECISION MAKING: Stable/uncomplicated  EVALUATION COMPLEXITY: Low   GOALS: Goals reviewed with patient? Yes  SHORT TERM GOALS: Target date: 06/18/2023   Pt will be ind with initial HEP Baseline: Goal status: INITIAL  2.  Pt will be able to be able to maintain posterior pelvic tilt in standing for improved postural stability to tolerate standing x15 min Baseline:  Goal status: INITIAL   LONG TERM GOALS: Target date: 07/09/2023   Pt will be ind with maintaining HEP and walking program Baseline:  Goal status: INITIAL  2.  Pt will be able to stand x 30 min per her personal goals for improved home and community tasks Baseline:  Goal status: INITIAL  3.  Pt will be able to walk at least 20 min without needing to sit and rest Baseline:  Goal status: INITIAL  4.  Pt will be able to improve 5x STS to </=20 sec to demo increased functional LE strength Baseline:  Goal status: INITIAL  5.  Pt will have improved FOTO score to >/=52 Baseline:  Goal status: INITIAL  PLAN:  PT FREQUENCY: 2x/week  PT DURATION: 6 weeks  PLANNED INTERVENTIONS: Therapeutic exercises, Therapeutic activity, Neuromuscular re-education, Balance training, Gait training, Patient/Family education, Self Care, Joint mobilization, Stair training, Dry Needling, Electrical stimulation, Spinal mobilization, Cryotherapy, Moist heat, Taping, Ionotophoresis 4mg /ml Dexamethasone, Manual therapy, and Re-evaluation.  PLAN FOR NEXT SESSION: Assess response to HEP. Work on pelvic mobility. Continue to progress glute and core strengthening (was challenged with posterior pelvic tilting without using her legs to push).    Laymon Stockert April Ma L Faiza Bansal, PT 05/28/2023, 3:44 PM

## 2023-06-02 ENCOUNTER — Ambulatory Visit (INDEPENDENT_AMBULATORY_CARE_PROVIDER_SITE_OTHER): Payer: Medicare Other | Admitting: Family Medicine

## 2023-06-02 ENCOUNTER — Encounter: Payer: Self-pay | Admitting: Family Medicine

## 2023-06-02 VITALS — BP 142/84 | HR 80 | Ht 64.5 in | Wt 185.0 lb

## 2023-06-02 DIAGNOSIS — G8929 Other chronic pain: Secondary | ICD-10-CM

## 2023-06-02 DIAGNOSIS — M545 Low back pain, unspecified: Secondary | ICD-10-CM | POA: Diagnosis not present

## 2023-06-02 NOTE — Progress Notes (Signed)
Rubin Payor, PhD, LAT, ATC acting as a scribe for [name], MD.  [name] is a 82 y.o. female who presents to [facility] at [facility] today for 6-wk f/u LBP w/ MRI review. Pt was last seen by Dr. Denyse Amass on 04/21/23 and a L-spine MRI was ordered. She was also referred to PT, completing 1 visit.  Today, pt reports continued back pain. Pt locates pain to lower back. MRI review today.  She does not have pain radiating down her legs.  She is just getting started with physical therapy at [facility].  Dx imaging: 04/29/23 L-spine MRI  04/21/23 L-spine XR  Pertinent review of systems: No fevers or chills  Relevant historical information: Hard of hearing and memory loss   Exam:  BP (!) 142/84   Pulse 80   Ht 5' 4.5" (1.638 m)   Wt 185 lb (83.9 kg)   SpO2 97%   BMI 31.26 kg/m  General: Well Developed, well nourished, and in no acute distress.   MSK: Normal lumbar motion pain with extension.  Normal gait.    Lab and Radiology Results   EXAM: MRI LUMBAR SPINE WITHOUT CONTRAST   TECHNIQUE: Multiplanar, multisequence MR imaging of the lumbar spine was performed. No intravenous contrast was administered.   COMPARISON:  06/19/2004   FINDINGS: Segmentation:  Standard.   Alignment:  Physiologic.   Vertebrae: No acute fracture, evidence of discitis, or aggressive bone lesion. Chronic L1 vertebral body compression fracture with 50% anterior height loss.   Conus medullaris and cauda equina: Conus extends to the L1 level. Conus and cauda equina appear normal.   Paraspinal and other soft tissues: No acute paraspinal abnormality.   Disc levels:   Disc spaces: Degenerative disease with disc height loss at L2-3, L3-4, L4-5 and L5-S1. Disc desiccation throughout the lumbar spine.   T11-12: Mild broad-based disc bulge. No foraminal or central canal stenosis.   T12-L1: Broad-based disc bulge. Mild bilateral facet arthropathy. No foraminal or  central canal stenosis.   L1-L2: Mild broad-based disc bulge. Moderate bilateral facet arthropathy. Mild bilateral foraminal stenosis. Mild spinal stenosis.   L2-L3: Broad-based disc bulge with a left subarticular disc extrusion and mass effect on the left L3 nerve root. Moderate spinal stenosis. No foraminal stenosis.   L3-L4: Mild broad-based disc bulge. Moderate bilateral facet arthropathy. Moderate spinal stenosis. Moderate left and mild right foraminal stenosis.   L4-L5: Broad-based disc osteophyte complex. Moderate bilateral facet arthropathy. Bilateral lateral recess stenosis. Moderate right and no left foraminal stenosis.   L5-S1: Mild broad-based disc bulge. Moderate bilateral foraminal stenosis. Mild bilateral facet arthropathy. No spinal stenosis.   IMPRESSION: 1. Diffuse lumbar spine spondylosis as described above. 2. No acute osseous injury of the lumbar spine.     Electronically Signed   By: Elige Ko M.D.   On: 05/11/2023 10:09 I, [name], personally (independently) visualized and performed the interpretation of the images attached in this note.     Assessment and Plan: 82 y.o. female with chronic back pain.  X-ray was concerning for compression fracture so MRI was obtained which fortunately did not show compression fracture.  She has multiple areas that could be pain generators for her back pain.  Fortunately she does not have pain radiating down her legs.  She just getting started with physical therapy.  I think giving PT a good chance makes sense.  Plan to reassess in about 6 weeks when she should be completing physical therapy.  Return  sooner if needed.  Next step if not better could be various injections including epidural steroid injections or facet injections.   PDMP not reviewed this encounter. No orders of the defined types were placed in this encounter.  No orders of the defined types were placed in this encounter.    Discussed warning signs  or symptoms. Please see discharge instructions. Patient expresses understanding.   The above documentation has been reviewed and is accurate and complete [name], M.D.

## 2023-06-02 NOTE — Patient Instructions (Signed)
Thank you for coming in today.   Recheck at the end of October or early November.   Continue PT.   Let me know if this is not working.

## 2023-06-03 ENCOUNTER — Encounter (HOSPITAL_COMMUNITY): Payer: Medicare Other

## 2023-06-05 ENCOUNTER — Encounter (HOSPITAL_COMMUNITY): Payer: Self-pay | Admitting: Physical Therapy

## 2023-06-05 ENCOUNTER — Ambulatory Visit (HOSPITAL_COMMUNITY): Payer: Medicare Other | Admitting: Physical Therapy

## 2023-06-05 DIAGNOSIS — M6281 Muscle weakness (generalized): Secondary | ICD-10-CM

## 2023-06-05 DIAGNOSIS — R262 Difficulty in walking, not elsewhere classified: Secondary | ICD-10-CM | POA: Diagnosis not present

## 2023-06-05 DIAGNOSIS — R293 Abnormal posture: Secondary | ICD-10-CM | POA: Diagnosis not present

## 2023-06-05 DIAGNOSIS — G8929 Other chronic pain: Secondary | ICD-10-CM | POA: Diagnosis not present

## 2023-06-05 DIAGNOSIS — M545 Low back pain, unspecified: Secondary | ICD-10-CM | POA: Diagnosis not present

## 2023-06-05 NOTE — Therapy (Signed)
OUTPATIENT PHYSICAL THERAPY THORACOLUMBAR TREATMENT   Patient Name: [name] MRN: [id number] DOB:11/15/1940, 82 y.o., female Today's Date: 06/05/2023  END OF SESSION:  PT End of Session - 06/05/23 1347     Visit Number 2    Number of Visits 12    Date for PT Re-Evaluation 07/09/23    Authorization Type Medicare    Progress Note Due on Visit 10    PT Start Time 1347    PT Stop Time 1425    PT Time Calculation (min) 38 min              Past Medical History:  Diagnosis Date   ALLERGIC RHINITIS 11/30/2008   Anxiety    CAROTID BRUIT, LEFT 04/19/2007   ultrasound reassuring   Closed fracture of lateral malleolus 06/04/2009   CLOSED FRACTURE OF METATARSAL BONE 06/04/2009   Depression    Esophageal reflux 11/08/2008   GANGLION CYST 04/19/2007   HYPERTENSION 04/07/2007   Hypothyroidism    INSOMNIA 08/07/2010   OSTEOARTHRITIS 04/07/2007   OSTEOPENIA 03/10/2008   PONV (postoperative nausea and vomiting)    UTI 07/20/2009   Past Surgical History:  Procedure Laterality Date   EYE SURGERY Bilateral    cataract removal   FUNCTIONAL ENDOSCOPIC SINUS SURGERY  01/2018   spinal fluid leaking behind right eye   HIP PINNING,CANNULATED Right 02/02/2022   Procedure: CANNULATED HIP PINNING;  Surgeon: Ernestina Columbia, MD;  Location: [facility];  Service: Orthopedics;  Laterality: Right;   JOINT REPLACEMENT     knuckle   REVERSE SHOULDER ARTHROPLASTY Left 09/09/2018   Procedure: REVERSE SHOULDER ARTHROPLASTY;  Surgeon: Francena Hanly, MD;  Location: [facility] OR;  Service: Orthopedics;  Laterality: Left;    TOTAL HIP ARTHROPLASTY Left    TUBAL LIGATION     Patient Active Problem List   Diagnosis Date Noted   Depression, major, single episode, in partial remission (HCC) 04/13/2023   Memory loss 09/26/2022   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HCC) 05/13/2022   History of hip fracture 02/04/2022   S/P reverse total shoulder arthroplasty, left 09/09/2018   Hypothyroidism, postop 11/19/2016   Cyst in  hand 08/23/2014   Attention deficit disorder 07/10/2014   GERD (gastroesophageal reflux disease) 11/17/2012   Depression with anxiety 10/28/2010   INSOMNIA 08/07/2010   ALLERGIC RHINITIS 11/30/2008   OSTEOPENIA 03/10/2008   Hyperlipidemia 04/19/2007   Essential hypertension 04/07/2007   Osteoarthritis 04/07/2007    PCP: Shelva Majestic, MD  REFERRING PROVIDER: Rodolph Bong, MD  REFERRING DIAG: M54.42,M54.41,G89.29 (ICD-10-CM) - Chronic bilateral low back pain with bilateral sciatica  Rationale for Evaluation and Treatment: Rehabilitation  THERAPY DIAG:  Muscle weakness (generalized)  Abnormal posture  Difficulty in walking, not elsewhere classified  ONSET DATE: ~1 year ago  SUBJECTIVE:  SUBJECTIVE STATEMENT: Pt states she is a little groggy this afternoon (she just got up from a nap). Reports she has not been able to do her exercises.   From eval: Pt states she fell ~1 year ago and hurt right below her waist in the center. It doesn't hurt while sitting. It hurts while standing. Can only walk ~10 min before she has to sit down. Can't finish washing dishes before having to sit.   PERTINENT HISTORY:  L THA, "brain leak" and had surgery to close it up  PAIN:  Are you having pain? Yes: NPRS scale: 8 while standing ; 0 while sitting/10 Pain location: center right below waist line Pain description: achy Aggravating factors: Prolonged standing activities/walking Relieving factors: sitting  PRECAUTIONS: None  RED FLAGS: None   WEIGHT BEARING RESTRICTIONS: No  FALLS:  Has patient fallen in last 6 months? No  LIVING ENVIRONMENT: Lives with: lives with their spouse Lives in: House/apartment Stairs: Yes: External: 1 steps; none Has following equipment at home: None  OCCUPATION:  Retired; play cards and visit friends  PLOF: Independent  PATIENT GOALS: Be able to stand with less pain for a period of time (~30 min)  NEXT MD VISIT: n/a  OBJECTIVE:   DIAGNOSTIC FINDINGS:  IMPRESSION: 1. Moderate compression fracture of L1 of unclear chronicity. This may have occurred 1 year ago consistent with the history of a fall. 2. No other fractures. 3. Degenerative changes as detailed.     Electronically Signed   By: Amie Portland M.D.   On: 04/27/2023 16:11  PATIENT SURVEYS:  FOTO 40; predicted 52  MUSCLE LENGTH: Hamstrings: Right 80 deg; Left 80 deg Thomas test: Right 0 deg; Left 0 deg  POSTURE: anterior pelvic tilt  PALPATION: Hypomobile with SI PA mobs and lumbosacral PA mobs No other gross muscular tightness or tenderness noted  LUMBAR ROM:   AROM eval  Flexion 2" above floor  Extension 100%  Right lateral flexion 100%  Left lateral flexion 100%  Right rotation 100%  Left rotation 100%   (Blank rows = not tested)  LOWER EXTREMITY ROM:     Active  Right eval Left eval  Hip flexion    Hip extension 5 5  Hip abduction    Hip adduction    Hip internal rotation    Hip external rotation    Knee flexion    Knee extension    Ankle dorsiflexion    Ankle plantarflexion    Ankle inversion    Ankle eversion     (Blank rows = not tested)  LOWER EXTREMITY MMT:    MMT Right eval Left eval  Hip flexion 5 5  Hip extension 3+ 3+  Hip abduction 3+ 3+  Hip adduction    Hip internal rotation    Hip external rotation    Knee flexion 5 5  Knee extension 5 5  Ankle dorsiflexion    Ankle plantarflexion    Ankle inversion    Ankle eversion     (Blank rows = not tested)  LUMBAR SPECIAL TESTS:  Straight leg raise test: Negative  FUNCTIONAL TESTS:  5 times sit to stand: 33.25 sec  GAIT: Distance walked: Into clinic Assistive device utilized: None Level of assistance: Complete Independence Comments: Forward flexed posture, anterior pelvic  tilt noted, diminished hip extension bilat  TODAY'S TREATMENT:  DATE:  06/05/23 Nustep L1 x 5 min UEs/LEs (reports some mild pain on lateral R LE) Standing lumbar extension against counter x10 Sitting figure 4 stretch x30 sec R&L Sitting hip flexor stretch x60 sec R&L Supine LTR x30 sec R&L Supine bridge 2x10 Supine PPT 2x10 Sidelying hip abd 2x10 Standing mini set 2x10 Standing glute set x10 Standing counter press down for ab set x10  05/28/23 See HEP below   PATIENT EDUCATION:  Education details: Exam findings, POC, initial HEP Person educated: Patient Education method: Explanation, Demonstration, and Handouts Education comprehension: verbalized understanding, returned demonstration, and needs further education  HOME EXERCISE PROGRAM: Access Code: [email or web address] URL: [email or web address] Date: 05/28/2023 Prepared by: Vernon Prey April Kirstie Peri  Exercises - Sidelying Hip Abduction  - 1 x daily - 7 x weekly - 2 sets - 10 reps - Supine Bridge  - 1 x daily - 7 x weekly - 2 sets - 10 reps - 5 sec hold - Supine Posterior Pelvic Tilt  - 1 x daily - 7 x weekly - 2 sets - 10 reps - Standing Gluteal Sets  - 1 x daily - 7 x weekly - 2 sets - 10 reps - 3 sec hold  ASSESSMENT:  CLINICAL IMPRESSION: Reviewed HEP with pt as she couldn't remember them. Improved ability to perform posterior pelvic tilts today. Primary focus on improving lumbar mobility and increasing hip and core strength. Pt tolerated session well. Increased difficulty with mini squats.   From eval: Patient is an 82 y.o. F who was seen today for physical therapy evaluation and treatment for low back pain. Assessment significant for gross glute weakness and lumbo/sacropelvic hypomobility leading to forward flexed posture, decreased stability and increased pain with standing and walking  activities limiting home and community tasks. Pt will highly benefit from PT to address these deficits for improved level of function.   OBJECTIVE IMPAIRMENTS: Abnormal gait, decreased activity tolerance, decreased balance, decreased endurance, decreased mobility, difficulty walking, decreased ROM, decreased strength, hypomobility, impaired flexibility, postural dysfunction, and pain.   ACTIVITY LIMITATIONS: bending, standing, transfers, and locomotion level  PARTICIPATION LIMITATIONS: meal prep, cleaning, laundry, shopping, community activity, and yard work  PERSONAL FACTORS: Age, Fitness, Past/current experiences, and Time since onset of injury/illness/exacerbation are also affecting patient's functional outcome.   REHAB POTENTIAL: Good  CLINICAL DECISION MAKING: Stable/uncomplicated  EVALUATION COMPLEXITY: Low   GOALS: Goals reviewed with patient? Yes  SHORT TERM GOALS: Target date: 06/18/2023   Pt will be ind with initial HEP Baseline: Goal status: INITIAL  2.  Pt will be able to be able to maintain posterior pelvic tilt in standing for improved postural stability to tolerate standing x15 min Baseline:  Goal status: INITIAL   LONG TERM GOALS: Target date: 07/09/2023   Pt will be ind with maintaining HEP and walking program Baseline:  Goal status: INITIAL  2.  Pt will be able to stand x 30 min per her personal goals for improved home and community tasks Baseline:  Goal status: INITIAL  3.  Pt will be able to walk at least 20 min without needing to sit and rest Baseline:  Goal status: INITIAL  4.  Pt will be able to improve 5x STS to </=20 sec to demo increased functional LE strength Baseline:  Goal status: INITIAL  5.  Pt will have improved FOTO score to >/=52 Baseline:  Goal status: INITIAL  PLAN:  PT FREQUENCY: 2x/week  PT DURATION: 6 weeks  PLANNED INTERVENTIONS: Therapeutic exercises, Therapeutic activity, Neuromuscular re-education, Balance  training, Gait training, Patient/Family education, Self Care, Joint mobilization, Stair training, Dry Needling, Electrical stimulation, Spinal mobilization, Cryotherapy, Moist heat, Taping, Ionotophoresis 4mg /ml Dexamethasone, Manual therapy, and Re-evaluation.  PLAN FOR NEXT SESSION: Assess response to HEP. Work on pelvic mobility. Continue to progress glute and core strengthening. Work on [employer] April Ma L [place], PT 06/05/2023, 1:50 PM

## 2023-06-09 ENCOUNTER — Encounter (HOSPITAL_COMMUNITY): Payer: Medicare Other

## 2023-06-11 ENCOUNTER — Encounter (HOSPITAL_COMMUNITY): Payer: Medicare Other

## 2023-06-15 ENCOUNTER — Encounter (HOSPITAL_COMMUNITY): Payer: Medicare Other

## 2023-06-22 ENCOUNTER — Telehealth: Payer: Self-pay | Admitting: Family Medicine

## 2023-06-22 MED ORDER — LEVOTHYROXINE SODIUM 100 MCG PO TABS: 100.0000 ug | ORAL_TABLET | Freq: Every day | ORAL | 0 refills | Status: DC

## 2023-06-22 MED ORDER — FLUOXETINE HCL 20 MG PO CAPS: 20.0000 mg | ORAL_CAPSULE | Freq: Every day | ORAL | 0 refills | Status: DC

## 2023-06-22 MED ORDER — AMLODIPINE BESYLATE 10 MG PO TABS: 10.0000 mg | ORAL_TABLET | Freq: Every day | ORAL | 0 refills | Status: DC

## 2023-06-22 MED ORDER — BUPROPION HCL ER (XL) 300 MG PO TB24: 300.0000 mg | ORAL_TABLET | Freq: Every day | ORAL | 0 refills | Status: DC

## 2023-06-22 NOTE — Telephone Encounter (Signed)
Prescription Request  06/22/2023  LOV: 05/26/2023  PATIENT IS ON VACATION IN [place] FOR 4 DAYS BUT FORGOT MEDICATION AT HOME. CAN WE SEND IN SMALL SUPPLY TO PHARMACY BELOW?  What is the name of the medication or equipment? AMLODIPINE (NORVASC) 10 MG TABLET   LEVOTHYROXINE (SYNTHROID) 100 MCG TABLET   BUPROPION (WELLBUTRIN XL) 300 MG 24 HR TABLET   FLUOXETINE (PROZAC) 20 MG CAPSULE   Have you contacted your pharmacy to request a refill? Yes   Which pharmacy would you like this sent to?  [facility]-- [address], [place], [place] [address] Phone: [phone]  Patient notified that their request is being sent to the clinical staff for review and that they should receive a response within 2 business days.   Please advise at Mobile [phone] (mobile)

## 2023-06-22 NOTE — Telephone Encounter (Signed)
Medications #4 sent to requested pharmacy.

## 2023-06-29 ENCOUNTER — Encounter (HOSPITAL_COMMUNITY): Payer: Medicare Other

## 2023-06-30 ENCOUNTER — Ambulatory Visit: Payer: Medicare Other

## 2023-06-30 ENCOUNTER — Encounter: Payer: Self-pay | Admitting: Physician Assistant

## 2023-06-30 ENCOUNTER — Ambulatory Visit: Payer: Medicare Other | Admitting: Physician Assistant

## 2023-06-30 DIAGNOSIS — Z029 Encounter for administrative examinations, unspecified: Secondary | ICD-10-CM

## 2023-07-01 ENCOUNTER — Ambulatory Visit (HOSPITAL_COMMUNITY): Payer: Medicare Other | Attending: Family Medicine

## 2023-07-01 DIAGNOSIS — R262 Difficulty in walking, not elsewhere classified: Secondary | ICD-10-CM | POA: Insufficient documentation

## 2023-07-01 DIAGNOSIS — M6281 Muscle weakness (generalized): Secondary | ICD-10-CM | POA: Insufficient documentation

## 2023-07-01 DIAGNOSIS — R293 Abnormal posture: Secondary | ICD-10-CM | POA: Diagnosis not present

## 2023-07-01 NOTE — Therapy (Signed)
OUTPATIENT PHYSICAL THERAPY THORACOLUMBAR TREATMENT   Patient Name: [name] MRN: [id number] DOB:28-Nov-1940, 82 y.o., female Today's Date: 07/01/2023  END OF SESSION:  PT End of Session - 07/01/23 1617     Visit Number 3    Number of Visits 12    Date for PT Re-Evaluation 07/09/23    Authorization Type Medicare    Progress Note Due on Visit 10    PT Start Time 0100    PT Stop Time 0140    PT Time Calculation (min) 40 min    Activity Tolerance Patient limited by pain    Behavior During Therapy [facility] for tasks assessed/performed               Past Medical History:  Diagnosis Date   ALLERGIC RHINITIS 11/30/2008   Anxiety    CAROTID BRUIT, LEFT 04/19/2007   ultrasound reassuring   Closed fracture of lateral malleolus 06/04/2009   CLOSED FRACTURE OF METATARSAL BONE 06/04/2009   Depression    Esophageal reflux 11/08/2008   GANGLION CYST 04/19/2007   HYPERTENSION 04/07/2007   Hypothyroidism    INSOMNIA 08/07/2010   OSTEOARTHRITIS 04/07/2007   OSTEOPENIA 03/10/2008   PONV (postoperative nausea and vomiting)    UTI 07/20/2009   Past Surgical History:  Procedure Laterality Date   EYE SURGERY Bilateral    cataract removal   FUNCTIONAL ENDOSCOPIC SINUS SURGERY  01/2018   spinal fluid leaking behind right eye   HIP PINNING,CANNULATED Right 02/02/2022   Procedure: CANNULATED HIP PINNING;  Surgeon: Ernestina Columbia, MD;  Location: [facility];  Service: Orthopedics;  Laterality: Right;   JOINT REPLACEMENT     knuckle   REVERSE SHOULDER ARTHROPLASTY Left 09/09/2018   Procedure: REVERSE SHOULDER ARTHROPLASTY;  Surgeon: Francena Hanly, MD;  Location: [facility];  Service: Orthopedics;  Laterality: Left;    TOTAL HIP ARTHROPLASTY Left    TUBAL LIGATION     Patient Active Problem List   Diagnosis Date Noted   Depression, major, single episode, in partial remission (HCC) 04/13/2023   Memory loss 09/26/2022   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HCC) 05/13/2022   History of hip fracture  02/04/2022   S/P reverse total shoulder arthroplasty, left 09/09/2018   Hypothyroidism, postop 11/19/2016   Cyst in hand 08/23/2014   Attention deficit disorder 07/10/2014   GERD (gastroesophageal reflux disease) 11/17/2012   Depression with anxiety 10/28/2010   INSOMNIA 08/07/2010   ALLERGIC RHINITIS 11/30/2008   OSTEOPENIA 03/10/2008   Hyperlipidemia 04/19/2007   Essential hypertension 04/07/2007   Osteoarthritis 04/07/2007    PCP: Shelva Majestic, MD  REFERRING PROVIDER: Rodolph Bong, MD  REFERRING DIAG: M54.42,M54.41,G89.29 (ICD-10-CM) - Chronic bilateral low back pain with bilateral sciatica  Rationale for Evaluation and Treatment: Rehabilitation  THERAPY DIAG:  Muscle weakness (generalized)  Abnormal posture  Difficulty in walking, not elsewhere classified  ONSET DATE: ~1 year ago  SUBJECTIVE:  SUBJECTIVE STATEMENT: Patient with impaired memory; relies on husband for memory. Pt states she can stand </=5 min until pain in right lower extremity.    From eval: Pt states she fell ~1 year ago and hurt right below her waist in the center. It doesn't hurt while sitting. It hurts while standing. Can only walk ~10 min before she has to sit down. Can't finish washing dishes before having to sit.   PERTINENT HISTORY:  L THA, "brain leak" and had surgery to close it up  PAIN:  Are you having pain? Yes: NPRS scale: 8 while standing ; 0 while sitting/10 Pain location: center right below waist line Pain description: achy Aggravating factors: Prolonged standing activities/walking Relieving factors: sitting  PRECAUTIONS: None  RED FLAGS: None   WEIGHT BEARING RESTRICTIONS: No  FALLS:  Has patient fallen in last 6 months? No  LIVING ENVIRONMENT: Lives with: lives with their  spouse Lives in: House/apartment Stairs: Yes: External: 1 steps; none Has following equipment at home: None  OCCUPATION: Retired; play cards and visit friends  PLOF: Independent  PATIENT GOALS: Be able to stand with less pain for a period of time (~30 min)  NEXT MD VISIT: n/a  OBJECTIVE:   DIAGNOSTIC FINDINGS:  IMPRESSION: 1. Moderate compression fracture of L1 of unclear chronicity. This may have occurred 1 year ago consistent with the history of a fall. 2. No other fractures. 3. Degenerative changes as detailed.     Electronically Signed   By: Amie Portland M.D.   On: 04/27/2023 16:11  PATIENT SURVEYS:  FOTO 40; predicted 52  MUSCLE LENGTH: Hamstrings: Right 80 deg; Left 80 deg Thomas test: Right 0 deg; Left 0 deg  POSTURE: anterior pelvic tilt  PALPATION: Hypomobile with SI PA mobs and lumbosacral PA mobs No other gross muscular tightness or tenderness noted  LUMBAR ROM:   AROM eval  Flexion 2" above floor  Extension 100%  Right lateral flexion 100%  Left lateral flexion 100%  Right rotation 100%  Left rotation 100%   (Blank rows = not tested)  LOWER EXTREMITY ROM:     Active  Right eval Left eval  Hip flexion    Hip extension 5 5  Hip abduction    Hip adduction    Hip internal rotation    Hip external rotation    Knee flexion    Knee extension    Ankle dorsiflexion    Ankle plantarflexion    Ankle inversion    Ankle eversion     (Blank rows = not tested)  LOWER EXTREMITY MMT:    MMT Right eval Left eval  Hip flexion 5 5  Hip extension 3+ 3+  Hip abduction 3+ 3+  Hip adduction    Hip internal rotation    Hip external rotation    Knee flexion 5 5  Knee extension 5 5  Ankle dorsiflexion    Ankle plantarflexion    Ankle inversion    Ankle eversion     (Blank rows = not tested)  LUMBAR SPECIAL TESTS:  Straight leg raise test: Negative  FUNCTIONAL TESTS:  5 times sit to stand: 33.25 sec  GAIT: Distance walked: Into  clinic Assistive device utilized: None Level of assistance: Complete Independence Comments: Forward flexed posture, anterior pelvic tilt noted, diminished hip extension bilat  TODAY'S TREATMENT:  DATE:   07/01/23 Nustep L1 x 5 min Seated  Forward Ball stretch Supine  Instrument-assisted soft tissue mobilization to right TFL, ITB   Clamshells with G theraband   Hip marches    06/05/23 Nustep L1 x 5 min UEs/LEs (reports some mild pain on lateral R LE) Standing lumbar extension against counter x10 Sitting figure 4 stretch x30 sec R&L Sitting hip flexor stretch x60 sec R&L Supine LTR x30 sec R&L Supine bridge 2x10 Supine PPT 2x10 Sidelying hip abd 2x10 Standing mini set 2x10 Standing glute set x10 Standing counter press down for ab set x10  05/28/23 See HEP below   PATIENT EDUCATION:  Education details: Exam findings, POC, initial HEP Person educated: Patient Education method: Explanation, Demonstration, and Handouts Education comprehension: verbalized understanding, returned demonstration, and needs further education  HOME EXERCISE PROGRAM: Access Code: [email or web address] URL: [email or web address] Date: 05/28/2023 Prepared by: Vernon Prey April Kirstie Peri  Exercises - Sidelying Hip Abduction  - 1 x daily - 7 x weekly - 2 sets - 10 reps - Supine Bridge  - 1 x daily - 7 x weekly - 2 sets - 10 reps - 5 sec hold - Supine Posterior Pelvic Tilt  - 1 x daily - 7 x weekly - 2 sets - 10 reps - Standing Gluteal Sets  - 1 x daily - 7 x weekly - 2 sets - 10 reps - 3 sec hold  ASSESSMENT:  CLINICAL IMPRESSION: Patient with increased TTP right hip, bursa. PT suspects right hip bursitis based on symptoms. PT included instrument-assisted soft tissue mobilization to right hip, ITB to relieve of lateral compartment tightness. Then PT continued with hip  strength in supine. Patient tolerated well; requires minimal verbals cues to progress   From eval: Patient is an 82 y.o. F who was seen today for physical therapy evaluation and treatment for low back pain. Assessment significant for gross glute weakness and lumbo/sacropelvic hypomobility leading to forward flexed posture, decreased stability and increased pain with standing and walking activities limiting home and community tasks. Pt will highly benefit from PT to address these deficits for improved level of function.   OBJECTIVE IMPAIRMENTS: Abnormal gait, decreased activity tolerance, decreased balance, decreased endurance, decreased mobility, difficulty walking, decreased ROM, decreased strength, hypomobility, impaired flexibility, postural dysfunction, and pain.   ACTIVITY LIMITATIONS: bending, standing, transfers, and locomotion level  PARTICIPATION LIMITATIONS: meal prep, cleaning, laundry, shopping, community activity, and yard work  PERSONAL FACTORS: Age, Fitness, Past/current experiences, and Time since onset of injury/illness/exacerbation are also affecting patient's functional outcome.   REHAB POTENTIAL: Good  CLINICAL DECISION MAKING: Stable/uncomplicated  EVALUATION COMPLEXITY: Low   GOALS: Goals reviewed with patient? Yes  SHORT TERM GOALS: Target date: 06/18/2023   Pt will be ind with initial HEP Baseline: Goal status: INITIAL  2.  Pt will be able to be able to maintain posterior pelvic tilt in standing for improved postural stability to tolerate standing x15 min Baseline:  Goal status: INITIAL   LONG TERM GOALS: Target date: 07/09/2023   Pt will be ind with maintaining HEP and walking program Baseline:  Goal status: INITIAL  2.  Pt will be able to stand x 30 min per her personal goals for improved home and community tasks Baseline:  Goal status: INITIAL  3.  Pt will be able to walk at least 20 min without needing to sit and rest Baseline:  Goal status:  INITIAL  4.  Pt will be able to improve 5x STS to </=20 sec to demo  increased functional LE strength Baseline:  Goal status: INITIAL  5.  Pt will have improved FOTO score to >/=52 Baseline:  Goal status: INITIAL  PLAN:  PT FREQUENCY: 2x/week  PT DURATION: 6 weeks  PLANNED INTERVENTIONS: Therapeutic exercises, Therapeutic activity, Neuromuscular re-education, Balance training, Gait training, Patient/Family education, Self Care, Joint mobilization, Stair training, Dry Needling, Electrical stimulation, Spinal mobilization, Cryotherapy, Moist heat, Taping, Ionotophoresis 4mg /ml Dexamethasone, Manual therapy, and Re-evaluation.  PLAN FOR NEXT SESSION: Assess response to HEP. Work on pelvic mobility. Continue to progress glute and core strengthening. Work on [employer], PT 07/01/2023, 4:18 PM

## 2023-07-07 ENCOUNTER — Encounter (HOSPITAL_COMMUNITY): Payer: Medicare Other

## 2023-07-09 ENCOUNTER — Encounter (HOSPITAL_COMMUNITY): Payer: Medicare Other

## 2023-07-09 ENCOUNTER — Encounter (HOSPITAL_COMMUNITY): Payer: Self-pay

## 2023-07-09 NOTE — Therapy (Signed)
PT called patient regarding missed session and company's no-show policy. Has 2 no-shows    [facility] Outpatient Rehabilitation at [facility] [address] [place], [place], [address] Phone: [phone]   Fax:  [phone]  Patient Details  Name: [name] MRN: [id number] Date of Birth: April 08, 1941 Referring Provider:  No ref. provider found  Encounter Date: 07/09/2023   Seymour Bars, PT 07/09/2023, 4:01 PM  [facility] Outpatient Rehabilitation at [facility] [address] [place], [place], [address] Phone: [phone]   Fax:  [phone]

## 2023-07-14 ENCOUNTER — Encounter (HOSPITAL_COMMUNITY): Payer: Medicare Other

## 2023-07-16 ENCOUNTER — Encounter (HOSPITAL_COMMUNITY): Payer: Medicare Other

## 2023-07-22 ENCOUNTER — Ambulatory Visit: Payer: Medicare Other | Admitting: Family Medicine

## 2023-07-22 VITALS — BP 142/86 | HR 79 | Ht 64.5 in | Wt 186.0 lb

## 2023-07-22 DIAGNOSIS — M545 Low back pain, unspecified: Secondary | ICD-10-CM | POA: Diagnosis not present

## 2023-07-22 DIAGNOSIS — G8929 Other chronic pain: Secondary | ICD-10-CM

## 2023-07-22 NOTE — Patient Instructions (Addendum)
Thank you for coming in today.   Plan for home health physical therapy.   If not better next step is probably an injection.   Because you have multiple areas in your back that could be causing the pain I likely will ask for help from a person who only does these back injection.   Dr Lorrine Kin is usually the person I use at the neurosurgery office in [facility]

## 2023-07-22 NOTE — Progress Notes (Unsigned)
Rubin Payor, PhD, LAT, ATC acting as a scribe for Pam Graham, MD.  [name] is a 82 y.o. female who presents to [facility] today for f/u LBP. Pt was last seen by Dr. Denyse Amass on 06/02/23 and was advised to cont PT, completing 3 total visits (numerous cancel/no-show visits).  Today, pt reports no back pain while sitting or laying. She has had to cancel a lot PT visits because she is in so much pain she is unable to get from the car to her visit. Pt locates pain to the midline of her low back, distal portion. Radiating pain only when standing along the lateral aspect of her R thigh.   Dx imaging: 04/29/23 L-spine MRI             04/21/23 L-spine XR  Pertinent review of systems: No fevers or chills  Relevant historical information: Hypertension.  Osteopenia. Memory problem  Exam:  BP (!) 142/86   Pulse 79   Ht 5' 4.5" (1.638 m)   Wt 186 lb (84.4 kg)   SpO2 97%   BMI 31.43 kg/m  General: Well Developed, well nourished, and in no acute distress.   MSK: L-spine nontender to palpation midline.  Range of motion lacks full extension.  Normal lower extremity strength.    Lab and Radiology Results  EXAM: MRI LUMBAR SPINE WITHOUT CONTRAST   TECHNIQUE: Multiplanar, multisequence MR imaging of the lumbar spine was performed. No intravenous contrast was administered.   COMPARISON:  06/19/2004   FINDINGS: Segmentation:  Standard.   Alignment:  Physiologic.   Vertebrae: No acute fracture, evidence of discitis, or aggressive bone lesion. Chronic L1 vertebral body compression fracture with 50% anterior height loss.   Conus medullaris and cauda equina: Conus extends to the L1 level. Conus and cauda equina appear normal.   Paraspinal and other soft tissues: No acute paraspinal abnormality.   Disc levels:   Disc spaces: Degenerative disease with disc height loss at L2-3, L3-4, L4-5 and L5-S1. Disc desiccation throughout the lumbar  spine.   T11-12: Mild broad-based disc bulge. No foraminal or central canal stenosis.   T12-L1: Broad-based disc bulge. Mild bilateral facet arthropathy. No foraminal or central canal stenosis.   L1-L2: Mild broad-based disc bulge. Moderate bilateral facet arthropathy. Mild bilateral foraminal stenosis. Mild spinal stenosis.   L2-L3: Broad-based disc bulge with a left subarticular disc extrusion and mass effect on the left L3 nerve root. Moderate spinal stenosis. No foraminal stenosis.   L3-L4: Mild broad-based disc bulge. Moderate bilateral facet arthropathy. Moderate spinal stenosis. Moderate left and mild right foraminal stenosis.   L4-L5: Broad-based disc osteophyte complex. Moderate bilateral facet arthropathy. Bilateral lateral recess stenosis. Moderate right and no left foraminal stenosis.   L5-S1: Mild broad-based disc bulge. Moderate bilateral foraminal stenosis. Mild bilateral facet arthropathy. No spinal stenosis.   IMPRESSION: 1. Diffuse lumbar spine spondylosis as described above. 2. No acute osseous injury of the lumbar spine.     Electronically Signed   By: Elige Ko M.D.   On: 05/11/2023 10:09 I, [name], personally (independently) visualized and performed the interpretation of the images attached in this note.     Assessment and Plan: 82 y.o. female with chronic low back pain without much radiation.  We discussed options.  She is very reluctant to consider a back injection.  Will try home health physical therapy.  She basically does not drive and has difficulty with transitions in and out of a  car.  Will try home health PT and if not improving will refer to pain management for injection planning and options.  She has multiple locations in her lumbar spine which could be candidates for injection including facet joints and epidural steroid injections.   PDMP not reviewed this encounter. Orders Placed This Encounter  Procedures   Ambulatory referral  to Home Health    Referral Priority:   Routine    Referral Type:   Home Health Care    Referral Reason:   Specialty Services Required    Requested Specialty:   Home Health Services    Number of Visits Requested:   1   No orders of the defined types were placed in this encounter.    Discussed warning signs or symptoms. Please see discharge instructions. Patient expresses understanding.   The above documentation has been reviewed and is accurate and complete [name], M.D. Total encounter time 30 minutes including face-to-face time with the patient and, reviewing past medical record, and charting on the date of service.

## 2023-07-28 ENCOUNTER — Ambulatory Visit: Payer: Medicare Other | Admitting: Family Medicine

## 2023-07-28 DIAGNOSIS — Z6831 Body mass index (BMI) 31.0-31.9, adult: Secondary | ICD-10-CM | POA: Diagnosis not present

## 2023-07-28 DIAGNOSIS — M47896 Other spondylosis, lumbar region: Secondary | ICD-10-CM | POA: Diagnosis not present

## 2023-07-28 DIAGNOSIS — M48061 Spinal stenosis, lumbar region without neurogenic claudication: Secondary | ICD-10-CM | POA: Diagnosis not present

## 2023-07-29 ENCOUNTER — Telehealth: Payer: Self-pay | Admitting: Family Medicine

## 2023-07-29 NOTE — Telephone Encounter (Signed)
Home Health Verbal Orders  Agency:  [facility]   Caller: Liji  Contact and title Ph# [phone] - PT  Requesting PT:    Reason for Request:  Decline, weakness of lower extremities and decreased balance  Frequency:  1 x per week for 1 week, 2 x per week for 3 weeks, then 1 x per week for 3 weeks  HH needs F2F w/in last 30 days

## 2023-07-30 NOTE — Telephone Encounter (Signed)
Called and spoke with Liji and VO given.

## 2023-08-03 ENCOUNTER — Encounter: Payer: Medicare Other | Admitting: Physician Assistant

## 2023-08-03 DIAGNOSIS — Z029 Encounter for administrative examinations, unspecified: Secondary | ICD-10-CM

## 2023-08-04 ENCOUNTER — Encounter: Payer: Self-pay | Admitting: Physician Assistant

## 2023-08-05 DIAGNOSIS — Z6831 Body mass index (BMI) 31.0-31.9, adult: Secondary | ICD-10-CM | POA: Diagnosis not present

## 2023-08-05 DIAGNOSIS — M47896 Other spondylosis, lumbar region: Secondary | ICD-10-CM | POA: Diagnosis not present

## 2023-08-05 DIAGNOSIS — M48061 Spinal stenosis, lumbar region without neurogenic claudication: Secondary | ICD-10-CM | POA: Diagnosis not present

## 2023-08-06 DIAGNOSIS — M48061 Spinal stenosis, lumbar region without neurogenic claudication: Secondary | ICD-10-CM | POA: Diagnosis not present

## 2023-08-06 DIAGNOSIS — Z6831 Body mass index (BMI) 31.0-31.9, adult: Secondary | ICD-10-CM | POA: Diagnosis not present

## 2023-08-06 DIAGNOSIS — M47896 Other spondylosis, lumbar region: Secondary | ICD-10-CM | POA: Diagnosis not present

## 2023-08-11 DIAGNOSIS — M47896 Other spondylosis, lumbar region: Secondary | ICD-10-CM | POA: Diagnosis not present

## 2023-08-11 DIAGNOSIS — Z6831 Body mass index (BMI) 31.0-31.9, adult: Secondary | ICD-10-CM | POA: Diagnosis not present

## 2023-08-11 DIAGNOSIS — M48061 Spinal stenosis, lumbar region without neurogenic claudication: Secondary | ICD-10-CM | POA: Diagnosis not present

## 2023-08-14 DIAGNOSIS — Z6831 Body mass index (BMI) 31.0-31.9, adult: Secondary | ICD-10-CM | POA: Diagnosis not present

## 2023-08-14 DIAGNOSIS — M48061 Spinal stenosis, lumbar region without neurogenic claudication: Secondary | ICD-10-CM | POA: Diagnosis not present

## 2023-08-14 DIAGNOSIS — M47896 Other spondylosis, lumbar region: Secondary | ICD-10-CM | POA: Diagnosis not present

## 2023-08-18 ENCOUNTER — Telehealth: Payer: Self-pay | Admitting: Family Medicine

## 2023-08-18 DIAGNOSIS — Z6831 Body mass index (BMI) 31.0-31.9, adult: Secondary | ICD-10-CM | POA: Diagnosis not present

## 2023-08-18 DIAGNOSIS — M48061 Spinal stenosis, lumbar region without neurogenic claudication: Secondary | ICD-10-CM | POA: Diagnosis not present

## 2023-08-18 DIAGNOSIS — M47896 Other spondylosis, lumbar region: Secondary | ICD-10-CM | POA: Diagnosis not present

## 2023-08-18 NOTE — Telephone Encounter (Signed)
Caller was Fredric Mare, [place] from [facility]. Caller states that patient has had two elevated BP readings. States last Friday her reading was [id number] and today it was 158/100. States patient informed her she is compliant with her medication, but she has been more anxiety filled/depressed lately. Caller can be reached @ [phone] for additional information.

## 2023-08-19 NOTE — Telephone Encounter (Signed)
Left message on machine for patient to call back.

## 2023-08-19 NOTE — Telephone Encounter (Signed)
Please confirm she is on losartan 100 mg, amlodipine 10 mg, hydrochlorothiazide 12.5 mg.  If she is taking these consistently lets do 1 more week of home readings and then update me again-possible trend back down.  Try to watch salt intake and stay well-hydrated in the meantime

## 2023-08-20 DIAGNOSIS — M48061 Spinal stenosis, lumbar region without neurogenic claudication: Secondary | ICD-10-CM | POA: Diagnosis not present

## 2023-08-20 DIAGNOSIS — M47896 Other spondylosis, lumbar region: Secondary | ICD-10-CM | POA: Diagnosis not present

## 2023-08-20 DIAGNOSIS — Z6831 Body mass index (BMI) 31.0-31.9, adult: Secondary | ICD-10-CM | POA: Diagnosis not present

## 2023-08-20 NOTE — Telephone Encounter (Signed)
Pt's spouse returned call. States pt had not taken her rx that morning & she has been fine. States home health is coming in again today & will check it again. Advised msg from the provider & spouse verbally agreed to his instructions.

## 2023-08-25 DIAGNOSIS — M47896 Other spondylosis, lumbar region: Secondary | ICD-10-CM | POA: Diagnosis not present

## 2023-08-25 DIAGNOSIS — Z6831 Body mass index (BMI) 31.0-31.9, adult: Secondary | ICD-10-CM | POA: Diagnosis not present

## 2023-08-25 DIAGNOSIS — M48061 Spinal stenosis, lumbar region without neurogenic claudication: Secondary | ICD-10-CM | POA: Diagnosis not present

## 2023-08-27 DIAGNOSIS — Z6831 Body mass index (BMI) 31.0-31.9, adult: Secondary | ICD-10-CM | POA: Diagnosis not present

## 2023-08-27 DIAGNOSIS — M47896 Other spondylosis, lumbar region: Secondary | ICD-10-CM | POA: Diagnosis not present

## 2023-08-27 DIAGNOSIS — M48061 Spinal stenosis, lumbar region without neurogenic claudication: Secondary | ICD-10-CM | POA: Diagnosis not present

## 2023-09-02 ENCOUNTER — Encounter (HOSPITAL_COMMUNITY): Payer: Self-pay

## 2023-09-02 NOTE — Therapy (Signed)
[facility] at [facility] [address] [place], [place], [address] Phone: [phone]   Fax:  [phone]  Patient Details  Name: [name] MRN: [id number] Date of Birth: [birth year] Referring Provider:  No ref. provider found  Encounter Date: 09/02/2023 PHYSICAL THERAPY DISCHARGE SUMMARY  Visits from Start of Care: 3  Current functional level related to goals / functional outcomes: Unknown   Remaining deficits: Unknown   Education / Equipment: NA   Patient is being discharged from skilled PT services. Patient goals were not met. Patient is being discharged due to not returning since the last visit.  Nelida Meuse, PT 09/02/2023, 3:19 PM  [facility] [facility] Out[facility] Rehabilitation at [facility] [address] [place], [place], [address] Phone: [phone]   Fax:  [phone]

## 2023-09-04 DIAGNOSIS — Z6831 Body mass index (BMI) 31.0-31.9, adult: Secondary | ICD-10-CM | POA: Diagnosis not present

## 2023-09-04 DIAGNOSIS — M47896 Other spondylosis, lumbar region: Secondary | ICD-10-CM | POA: Diagnosis not present

## 2023-09-04 DIAGNOSIS — M48061 Spinal stenosis, lumbar region without neurogenic claudication: Secondary | ICD-10-CM | POA: Diagnosis not present

## 2023-09-10 DIAGNOSIS — Z6831 Body mass index (BMI) 31.0-31.9, adult: Secondary | ICD-10-CM | POA: Diagnosis not present

## 2023-09-10 DIAGNOSIS — M47896 Other spondylosis, lumbar region: Secondary | ICD-10-CM | POA: Diagnosis not present

## 2023-09-10 DIAGNOSIS — M48061 Spinal stenosis, lumbar region without neurogenic claudication: Secondary | ICD-10-CM | POA: Diagnosis not present

## 2023-09-22 ENCOUNTER — Encounter: Payer: Self-pay | Admitting: Family Medicine

## 2023-11-02 ENCOUNTER — Other Ambulatory Visit: Payer: Self-pay | Admitting: Family Medicine

## 2023-11-23 ENCOUNTER — Other Ambulatory Visit: Payer: Self-pay | Admitting: Family Medicine

## 2023-12-23 ENCOUNTER — Other Ambulatory Visit: Payer: Self-pay | Admitting: Family Medicine

## 2024-05-10 ENCOUNTER — Encounter

## 2024-05-14 ENCOUNTER — Other Ambulatory Visit: Payer: Self-pay | Admitting: Family Medicine

## 2024-05-17 ENCOUNTER — Ambulatory Visit (INDEPENDENT_AMBULATORY_CARE_PROVIDER_SITE_OTHER): Admitting: Family Medicine

## 2024-05-17 ENCOUNTER — Encounter: Payer: Self-pay | Admitting: Family Medicine

## 2024-05-17 VITALS — BP 110/78 | HR 68 | Temp 97.0°F | Ht 64.5 in | Wt 193.2 lb

## 2024-05-17 DIAGNOSIS — Z131 Encounter for screening for diabetes mellitus: Secondary | ICD-10-CM

## 2024-05-17 DIAGNOSIS — Q019 Encephalocele, unspecified: Secondary | ICD-10-CM

## 2024-05-17 DIAGNOSIS — I10 Essential (primary) hypertension: Secondary | ICD-10-CM | POA: Diagnosis not present

## 2024-05-17 DIAGNOSIS — E890 Postprocedural hypothyroidism: Secondary | ICD-10-CM

## 2024-05-17 DIAGNOSIS — R413 Other amnesia: Secondary | ICD-10-CM | POA: Diagnosis not present

## 2024-05-17 DIAGNOSIS — E538 Deficiency of other specified B group vitamins: Secondary | ICD-10-CM | POA: Diagnosis not present

## 2024-05-17 DIAGNOSIS — F418 Other specified anxiety disorders: Secondary | ICD-10-CM | POA: Diagnosis not present

## 2024-05-17 DIAGNOSIS — E7800 Pure hypercholesterolemia, unspecified: Secondary | ICD-10-CM | POA: Diagnosis not present

## 2024-05-17 DIAGNOSIS — E669 Obesity, unspecified: Secondary | ICD-10-CM | POA: Diagnosis not present

## 2024-05-17 MED ORDER — BUPROPION HCL ER (XL) 300 MG PO TB24: 300.0000 mg | ORAL_TABLET | Freq: Every day | ORAL | 3 refills | Status: AC

## 2024-05-17 MED ORDER — FLUOXETINE HCL 20 MG PO CAPS: 20.0000 mg | ORAL_CAPSULE | Freq: Every day | ORAL | 3 refills | Status: AC

## 2024-05-17 NOTE — Progress Notes (Addendum)
 Phone [phone] In person visit   Subjective:   [name] is a 83 y.o. year old very pleasant female patient who presents for/with See problem oriented charting Chief Complaint  Patient presents with   Medical Management of Chronic Issues    Pts husband stated she has been sleeping a lot more, for almost 24 hours at a time; memory is getting worse;    Past Medical History-  Patient Active Problem List   Diagnosis Date Noted   Memory loss 09/26/2022    Priority: High   Depression with anxiety 10/28/2010    Priority: High   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HCC) 05/13/2022    Priority: Medium    History of hip fracture 02/04/2022    Priority: Medium    Hypothyroidism, postop 11/19/2016    Priority: Medium    Attention deficit disorder 07/10/2014    Priority: Medium    OSTEOPENIA 03/10/2008    Priority: Medium    Hyperlipidemia 04/19/2007    Priority: Medium    Essential hypertension 04/07/2007    Priority: Medium    GERD (gastroesophageal reflux disease) 11/17/2012    Priority: Low   INSOMNIA 08/07/2010    Priority: Low   ALLERGIC RHINITIS 11/30/2008    Priority: Low   S/P reverse total shoulder arthroplasty, 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   Cyst in hand 08/23/2014    Priority: 1.   Osteoarthritis 04/07/2007    Priority: 1.   Depression, major, single episode, in partial remission (HCC) 04/13/2023    Medications- reviewed and updated Current Outpatient Medications  Medication Sig Dispense Refill   amLODipine  (NORVASC ) 10 MG tablet Take 1 tablet (10 mg total) by mouth daily. 4 tablet 0   cholecalciferol (VITAMIN D3) 25 MCG (1000 UNIT) tablet Take 1,000 Units by mouth daily.     cyanocobalamin  1000 MCG tablet Take 1,000 mcg by mouth daily. New start 04/13/23- to help with memory     hydrochlorothiazide  (HYDRODIURIL ) 12.5 MG tablet TAKE 1 TABLET(12.5 MG) BY MOUTH DAILY 90 tablet 3   levothyroxine  (SYNTHROID ) 100 MCG tablet TAKE 1 TABLET(100 MCG) BY MOUTH DAILY 30 tablet 5    losartan  (COZAAR ) 100 MG tablet TAKE 1 TABLET(100 MG) BY MOUTH EVERY DAY 90 tablet 3   Multiple Vitamin (MULTIVITAMIN) capsule Take 1 capsule by mouth daily. Centrum Silver     Polyethyl Glycol-Propyl Glycol 0.4-0.3 % SOLN Place 2 drops into both eyes 2 (two) times daily.     buPROPion  (WELLBUTRIN  XL) 300 MG 24 hr tablet Take 1 tablet (300 mg total) by mouth daily. 90 tablet 3   FLUoxetine  (PROZAC ) 20 MG capsule Take 1 capsule (20 mg total) by mouth daily. 90 capsule 3   No current facility-administered medications for this visit.     Objective:  BP 110/78 (BP Location: Left Arm, Patient Position: Sitting, Cuff Size: Normal)   Pulse 68   Temp (!) 97 F (36.1 C) (Temporal)   Ht 5' 4.5 (1.638 m)   Wt 193 lb 3.2 oz (87.6 kg)   SpO2 95%   BMI 32.65 kg/m  Gen: NAD, resting comfortably CV: RRR no murmurs rubs or gallops Lungs: CTAB no crackles, wheeze, rhonchi Abdomen: soft/nontender/nondistended/normal bowel sounds. No rebound or guarding.  Ext: no edema Skin: warm, dry Neuro: hard of hearing- looks to husband for answers    Assessment and Plan   # Memory loss S:over a year of issues reported in august 2023- increased forgetfullness  -MRI reassuring 05/04/23 including stability of prior cephalocele -Did  have a notably low B12 in the low 200s 02/13/2023 we recommended daily B12 for a month and then once a week once again at 04/13/2023 visit- hse is taking this now  -Referral to neurology September 2024-we reached out on September 22, 2023 to encouraged to schedule follow-up but they did not read message on MyChart- decided not to reschedule  -sleeping a lot- has slept nearly 24 hours a few days. Light snoring. Most of the time though usually 10-12 hours -memory has worsened in last year A/P: memory loss noted but she's not interested in further follow up /work up. She wants to monitor only. Discussed likely continued decline  -prior adderrall for years so that being gone could  contirbute -suspect underlying alzheimer's dementia  #hypertension S: medication: Amlodipine  10 milligrams, hydrochlorothiazide  12.5 mg, losartan  100 mg A/P: well controlled continue current medications  -weight up a few lbs 8 in last year- encouraged mild weight loss  #hypothyroidism S: compliant On thyroid  medication-levothyroxine  100 mcg- not missing doses Lab Results  Component Value Date   TSH 4.46 02/13/2023  A/P:with some more fatigue and sleepiness and weight gain check TSH, t3, t4   # Hyperglycemia/insulin resistance/prediabetes- peak a1c 5.8 in 2021 S:  Medication: none Lab Results  Component Value Date   HGBA1C 5.8 11/21/2019   A/P: hopefully stable- update a1c today. Continue without meds for now  -could cut back on chocolate    # Depression S: Medication:Lexapro  10 mg--> pristiq  25 mg--> fluoxetine  20 mg, Wellbutrin  300 mg extended release- out for 3-4 days but sleepiness is not worse in this time frame    05/17/2024    1:55 PM 05/26/2023    2:36 PM 05/26/2023    2:31 PM  Depression screen PHQ 2/9  Decreased Interest 3 3 0  Down, Depressed, Hopeless 3 1 0  PHQ - 2 Score 6 4 0  Altered sleeping 3 1 0  Tired, decreased energy 3 3 0  Change in appetite 0 0 0  Feeling bad or failure about yourself  0 3 0  Trouble concentrating 1 1 0  Moving slowly or fidgety/restless 0 0 0  Suicidal thoughts 0 0 0  PHQ-9 Score 13 12 0  Difficult doing work/chores Somewhat difficult Very difficult Not difficult at all  A/P: depression with mild poor control. Not interested in therapy or medication change- recheck TSH today- possible that could contribute   #Back pain- didn't do well with physical therapy due to being in so much pain by time she got there. Has worked with DrRONITA Joane in past. 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 Right sphenoid sinus fluid level continues to suggest CSF leak.  -STABLE brain MRI fall 2024- no is advised to for repeat  -reports had surgery to correct this with ENT [facility] A/P: glad this has been stable- continue to monitor     Recommended follow up: Return in about 3 months (around 08/16/2024) for followup or sooner if needed.Schedule b4 you leave.   Lab/Order associations: NOT fasting   ICD-10-CM   1. Memory loss  R41.3     2. 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HCC) Chronic Q01.9  3. Depression with anxiety  F41.8     4. Essential hypertension  I10 Comprehensive metabolic panel with GFR    CBC with Differential/Platelet    Lipid panel    5. Pure hypercholesterolemia  E78.00 Comprehensive metabolic panel with GFR    CBC with Differential/Platelet    Lipid panel    6. Hypothyroidism, postop  E89.0 TSH    T4, free    T3, free    7. Screening for diabetes mellitus  Z13.1 Hemoglobin A1c    8. Obesity (BMI 30-39.9)  E66.9 Hemoglobin A1c    9. B12 deficiency  E53.8 Vitamin B12      Meds ordered this encounter  Medications   buPROPion  (WELLBUTRIN  XL) 300 MG 24 hr tablet    Sig: Take 1 tablet (300 mg total) by mouth daily.    Dispense:  90 tablet    Refill:  3   FLUoxetine  (PROZAC ) 20 MG capsule    Sig: Take 1 capsule (20 mg total) by mouth daily.    Dispense:  90 capsule    Refill:  3    Return precautions advised.  Garnette Lukes, MD

## 2024-05-17 NOTE — Patient Instructions (Addendum)
 Please stop by lab before you go If you have mychart- we will send your results within 3 business days of us  receiving them.  If you do not have mychart- we will call you about results within 5 business days of us  receiving them.  *please also note that you will see labs on mychart as soon as they post. I will later go in and write notes on them- will say notes from Dr. Katrinka   No changes today unless labs lead us  to make changes Or let me know if change mind and want to see neurology  Recommended follow up: Return in about 6 months (around 11/14/2024) for followup or sooner if needed.Schedule b4 you leave.

## 2024-05-18 ENCOUNTER — Ambulatory Visit: Payer: Self-pay | Admitting: Family Medicine

## 2024-05-18 LAB — CBC WITH DIFFERENTIAL/PLATELET
Basophils Absolute: 0.1 K/uL (ref 0.0–0.1)
Basophils Relative: 1.1 % (ref 0.0–3.0)
Eosinophils Absolute: 0.3 K/uL (ref 0.0–0.7)
Eosinophils Relative: 2.9 % (ref 0.0–5.0)
HCT: 46 % (ref 36.0–46.0)
Hemoglobin: 15.1 g/dL — ABNORMAL HIGH (ref 12.0–15.0)
Lymphocytes Relative: 32.6 % (ref 12.0–46.0)
Lymphs Abs: 3.5 K/uL (ref 0.7–4.0)
MCHC: 32.8 g/dL (ref 30.0–36.0)
MCV: 89.1 fl (ref 78.0–100.0)
Monocytes Absolute: 0.9 K/uL (ref 0.1–1.0)
Monocytes Relative: 8 % (ref 3.0–12.0)
Neutro Abs: 6 K/uL (ref 1.4–7.7)
Neutrophils Relative %: 55.4 % (ref 43.0–77.0)
Platelets: 386 K/uL (ref 150.0–400.0)
RBC: 5.16 Mil/uL — ABNORMAL HIGH (ref 3.87–5.11)
RDW: 14.4 % (ref 11.5–15.5)
WBC: 10.8 K/uL — ABNORMAL HIGH (ref 4.0–10.5)

## 2024-05-18 LAB — LIPID PANEL
Cholesterol: 228 mg/dL — ABNORMAL HIGH (ref 0–200)
HDL: 64.2 mg/dL (ref >39.00)
LDL Cholesterol: 137 mg/dL — ABNORMAL HIGH (ref 0–99)
NonHDL: 163.42
Total CHOL/HDL Ratio: 4
Triglycerides: 134 mg/dL (ref 0.0–149.0)
VLDL: 26.8 mg/dL (ref 0.0–40.0)

## 2024-05-18 LAB — T3, FREE: T3, Free: 2.6 pg/mL (ref 2.3–4.2)

## 2024-05-18 LAB — COMPREHENSIVE METABOLIC PANEL WITH GFR
ALT: 18 U/L (ref 0–35)
AST: 21 U/L (ref 0–37)
Albumin: 4.2 g/dL (ref 3.5–5.2)
Alkaline Phosphatase: 87 U/L (ref 39–117)
BUN: 13 mg/dL (ref 6–23)
CO2: 30 meq/L (ref 19–32)
Calcium: 10.1 mg/dL (ref 8.4–10.5)
Chloride: 102 meq/L (ref 96–112)
Creatinine, Ser: 1.1 mg/dL (ref 0.40–1.20)
GFR: 46.19 mL/min — ABNORMAL LOW (ref >60.00)
Glucose, Bld: 104 mg/dL — ABNORMAL HIGH (ref 70–99)
Potassium: 4.5 meq/L (ref 3.5–5.1)
Sodium: 141 meq/L (ref 135–145)
Total Bilirubin: 0.4 mg/dL (ref 0.2–1.2)
Total Protein: 6.8 g/dL (ref 6.0–8.3)

## 2024-05-18 LAB — T4, FREE: Free T4: 0.55 ng/dL — ABNORMAL LOW (ref 0.60–1.60)

## 2024-05-18 LAB — HEMOGLOBIN A1C: Hgb A1c MFr Bld: 6.2 % (ref 4.6–6.5)

## 2024-05-18 LAB — TSH: TSH: 15.75 u[IU]/mL — ABNORMAL HIGH (ref 0.35–5.50)

## 2024-05-18 LAB — VITAMIN B12: Vitamin B-12: >1500 pg/mL — ABNORMAL HIGH (ref 211–911)

## 2024-06-13 ENCOUNTER — Other Ambulatory Visit: Payer: Self-pay | Admitting: Family Medicine

## 2024-11-15 ENCOUNTER — Ambulatory Visit: Admitting: Family Medicine
